# Patient Record
Sex: Male | Born: 1955
Health system: Southern US, Community
[De-identification: ages and names within clinical notes are randomized; demographics above are authoritative.]

## PROBLEM LIST (undated history)

## (undated) DIAGNOSIS — E785 Hyperlipidemia, unspecified: Secondary | ICD-10-CM

## (undated) DIAGNOSIS — I10 Essential (primary) hypertension: Secondary | ICD-10-CM

## (undated) DIAGNOSIS — R739 Hyperglycemia, unspecified: Secondary | ICD-10-CM

## (undated) DIAGNOSIS — M1990 Unspecified osteoarthritis, unspecified site: Secondary | ICD-10-CM

## (undated) DIAGNOSIS — G510 Bell's palsy: Secondary | ICD-10-CM

## (undated) DIAGNOSIS — E119 Type 2 diabetes mellitus without complications: Secondary | ICD-10-CM

## (undated) HISTORY — DX: Essential (primary) hypertension: I10

## (undated) HISTORY — DX: Type 2 diabetes mellitus without complications: E11.9

## (undated) HISTORY — DX: Bell's palsy: G51.0

## (undated) HISTORY — DX: Hyperglycemia, unspecified: R73.9

## (undated) HISTORY — PX: COLONOSCOPY W/ POLYPECTOMY: SHX1380

## (undated) HISTORY — PX: OTHER SURGICAL HISTORY: SHX169

## (undated) HISTORY — DX: Hyperlipidemia, unspecified: E78.5

## (undated) HISTORY — PX: COLONOSCOPY: SHX174

## (undated) HISTORY — DX: Unspecified osteoarthritis, unspecified site: M19.90

---

## 2001-03-02 ENCOUNTER — Encounter: Payer: Self-pay | Admitting: Internal Medicine

## 2001-03-05 ENCOUNTER — Other Ambulatory Visit: Admission: RE | Admit: 2001-03-05 | Discharge: 2001-03-05 | Payer: Self-pay | Admitting: Gastroenterology

## 2001-03-05 ENCOUNTER — Encounter (INDEPENDENT_AMBULATORY_CARE_PROVIDER_SITE_OTHER): Payer: Self-pay | Admitting: Specialist

## 2003-12-25 ENCOUNTER — Encounter: Admission: RE | Admit: 2003-12-25 | Discharge: 2003-12-25 | Payer: Self-pay | Admitting: Internal Medicine

## 2004-03-08 ENCOUNTER — Encounter: Admission: RE | Admit: 2004-03-08 | Discharge: 2004-03-08 | Payer: Self-pay | Admitting: Internal Medicine

## 2005-01-18 ENCOUNTER — Ambulatory Visit: Payer: Self-pay | Admitting: Internal Medicine

## 2005-01-25 ENCOUNTER — Ambulatory Visit: Payer: Self-pay | Admitting: Internal Medicine

## 2005-02-03 ENCOUNTER — Ambulatory Visit: Payer: Self-pay

## 2005-03-04 ENCOUNTER — Ambulatory Visit: Payer: Self-pay | Admitting: Internal Medicine

## 2005-08-16 ENCOUNTER — Ambulatory Visit: Payer: Self-pay | Admitting: Internal Medicine

## 2005-09-26 ENCOUNTER — Ambulatory Visit: Payer: Self-pay | Admitting: Internal Medicine

## 2006-03-20 ENCOUNTER — Ambulatory Visit: Payer: Self-pay | Admitting: Internal Medicine

## 2007-07-17 ENCOUNTER — Ambulatory Visit: Payer: Self-pay | Admitting: Internal Medicine

## 2007-12-11 ENCOUNTER — Telehealth: Payer: Self-pay | Admitting: *Deleted

## 2007-12-11 ENCOUNTER — Emergency Department (HOSPITAL_COMMUNITY): Admission: EM | Admit: 2007-12-11 | Discharge: 2007-12-11 | Payer: Self-pay | Admitting: Emergency Medicine

## 2007-12-24 ENCOUNTER — Ambulatory Visit: Payer: Self-pay | Admitting: Internal Medicine

## 2007-12-24 DIAGNOSIS — I10 Essential (primary) hypertension: Secondary | ICD-10-CM | POA: Insufficient documentation

## 2007-12-24 DIAGNOSIS — R7309 Other abnormal glucose: Secondary | ICD-10-CM | POA: Insufficient documentation

## 2008-05-07 ENCOUNTER — Ambulatory Visit: Payer: Self-pay | Admitting: Internal Medicine

## 2008-05-12 LAB — CONVERTED CEMR LAB
BUN: 18 mg/dL (ref 6–23)
CO2: 29 meq/L (ref 19–32)
Calcium: 9.8 mg/dL (ref 8.4–10.5)
Chloride: 101 meq/L (ref 96–112)
Creatinine, Ser: 1.3 mg/dL (ref 0.4–1.5)
GFR calc Af Amer: 74 mL/min
GFR calc non Af Amer: 61 mL/min
Glucose, Bld: 106 mg/dL — ABNORMAL HIGH (ref 70–99)
Hgb A1c MFr Bld: 6.6 % — ABNORMAL HIGH (ref 4.6–6.0)
Potassium: 4.5 meq/L (ref 3.5–5.1)
Sodium: 138 meq/L (ref 135–145)

## 2008-08-11 ENCOUNTER — Ambulatory Visit: Payer: Self-pay | Admitting: Internal Medicine

## 2008-08-12 LAB — CONVERTED CEMR LAB
ALT: 33 units/L (ref 0–53)
AST: 26 units/L (ref 0–37)
BUN: 18 mg/dL (ref 6–23)
CO2: 31 meq/L (ref 19–32)
Calcium: 9.3 mg/dL (ref 8.4–10.5)
Chloride: 111 meq/L (ref 96–112)
Cholesterol: 146 mg/dL (ref 0–200)
Creatinine, Ser: 1.2 mg/dL (ref 0.4–1.5)
GFR calc Af Amer: 81 mL/min
GFR calc non Af Amer: 67 mL/min
Glucose, Bld: 102 mg/dL — ABNORMAL HIGH (ref 70–99)
HDL: 49 mg/dL (ref >39.0)
Hgb A1c MFr Bld: 6.7 % — ABNORMAL HIGH (ref 4.6–6.0)
LDL Cholesterol: 86 mg/dL (ref 0–99)
Potassium: 4.8 meq/L (ref 3.5–5.1)
Sodium: 143 meq/L (ref 135–145)
Total CHOL/HDL Ratio: 3
Triglycerides: 57 mg/dL (ref 0–149)
VLDL: 11 mg/dL (ref 0–40)

## 2009-06-25 ENCOUNTER — Ambulatory Visit: Payer: Self-pay | Admitting: Internal Medicine

## 2009-08-06 ENCOUNTER — Ambulatory Visit: Payer: Self-pay | Admitting: Internal Medicine

## 2009-08-10 LAB — CONVERTED CEMR LAB
BUN: 13 mg/dL (ref 6–23)
CO2: 30 meq/L (ref 19–32)
Calcium: 9.1 mg/dL (ref 8.4–10.5)
Chloride: 107 meq/L (ref 96–112)
Cholesterol: 162 mg/dL (ref 0–200)
Creatinine, Ser: 1.2 mg/dL (ref 0.4–1.5)
GFR calc non Af Amer: 80.75 mL/min (ref >60)
Glucose, Bld: 111 mg/dL — ABNORMAL HIGH (ref 70–99)
HDL: 53.2 mg/dL (ref >39.00)
Hgb A1c MFr Bld: 6.7 % — ABNORMAL HIGH (ref 4.6–6.5)
LDL Cholesterol: 97 mg/dL (ref 0–99)
Potassium: 5.2 meq/L — ABNORMAL HIGH (ref 3.5–5.1)
Sodium: 140 meq/L (ref 135–145)
TSH: 2 microintl units/mL (ref 0.35–5.50)
Total CHOL/HDL Ratio: 3
Triglycerides: 61 mg/dL (ref 0.0–149.0)
VLDL: 12.2 mg/dL (ref 0.0–40.0)

## 2010-07-06 ENCOUNTER — Telehealth: Payer: Self-pay | Admitting: Internal Medicine

## 2010-12-27 ENCOUNTER — Ambulatory Visit
Admission: RE | Admit: 2010-12-27 | Discharge: 2010-12-27 | Payer: Self-pay | Source: Home / Self Care | Attending: Internal Medicine | Admitting: Internal Medicine

## 2010-12-27 ENCOUNTER — Other Ambulatory Visit: Payer: Self-pay | Admitting: Internal Medicine

## 2010-12-27 LAB — TSH: TSH: 1.75 u[IU]/mL (ref 0.35–5.50)

## 2010-12-27 LAB — CBC WITH DIFFERENTIAL/PLATELET
Basophils Absolute: 0 10*3/uL (ref 0.0–0.1)
Basophils Relative: 0.2 % (ref 0.0–3.0)
Eosinophils Absolute: 0.2 10*3/uL (ref 0.0–0.7)
Eosinophils Relative: 4 % (ref 0.0–5.0)
HCT: 45.8 % (ref 39.0–52.0)
Hemoglobin: 15.4 g/dL (ref 13.0–17.0)
Lymphocytes Relative: 49.3 % — ABNORMAL HIGH (ref 12.0–46.0)
Lymphs Abs: 2.8 10*3/uL (ref 0.7–4.0)
MCHC: 33.5 g/dL (ref 30.0–36.0)
MCV: 98.7 fl (ref 78.0–100.0)
Monocytes Absolute: 0.3 10*3/uL (ref 0.1–1.0)
Monocytes Relative: 6.2 % (ref 3.0–12.0)
Neutro Abs: 2.3 10*3/uL (ref 1.4–7.7)
Neutrophils Relative %: 40.3 % — ABNORMAL LOW (ref 43.0–77.0)
Platelets: 166 10*3/uL (ref 150.0–400.0)
RBC: 4.64 Mil/uL (ref 4.22–5.81)
RDW: 14.3 % (ref 11.5–14.6)
WBC: 5.6 10*3/uL (ref 4.5–10.5)

## 2010-12-27 LAB — LIPID PANEL
Cholesterol: 168 mg/dL (ref 0–200)
HDL: 60.8 mg/dL (ref >39.00)
LDL Cholesterol: 92 mg/dL (ref 0–99)
Total CHOL/HDL Ratio: 3
Triglycerides: 77 mg/dL (ref 0.0–149.0)
VLDL: 15.4 mg/dL (ref 0.0–40.0)

## 2010-12-27 LAB — CONVERTED CEMR LAB
Bilirubin Urine: NEGATIVE
Blood in Urine, dipstick: NEGATIVE
Glucose, Urine, Semiquant: NEGATIVE
Ketones, urine, test strip: NEGATIVE
Nitrite: NEGATIVE
Protein, U semiquant: NEGATIVE
Specific Gravity, Urine: >=1.03
Urobilinogen, UA: 0.2
WBC Urine, dipstick: NEGATIVE
pH: 5.5

## 2010-12-27 LAB — PSA: PSA: 0.64 ng/mL (ref 0.10–4.00)

## 2010-12-27 LAB — BASIC METABOLIC PANEL
BUN: 18 mg/dL (ref 6–23)
CO2: 27 mEq/L (ref 19–32)
Calcium: 8.9 mg/dL (ref 8.4–10.5)
Chloride: 103 mEq/L (ref 96–112)
Creatinine, Ser: 1.3 mg/dL (ref 0.4–1.5)
GFR: 70.74 mL/min (ref >60.00)
Glucose, Bld: 107 mg/dL — ABNORMAL HIGH (ref 70–99)
Potassium: 4.4 mEq/L (ref 3.5–5.1)
Sodium: 137 mEq/L (ref 135–145)

## 2010-12-27 LAB — HEPATIC FUNCTION PANEL
ALT: 30 U/L (ref 0–53)
AST: 25 U/L (ref 0–37)
Albumin: 3.9 g/dL (ref 3.5–5.2)
Alkaline Phosphatase: 72 U/L (ref 39–117)
Bilirubin, Direct: 0.1 mg/dL (ref 0.0–0.3)
Total Bilirubin: 0.4 mg/dL (ref 0.3–1.2)
Total Protein: 7.3 g/dL (ref 6.0–8.3)

## 2011-01-03 ENCOUNTER — Ambulatory Visit
Admission: RE | Admit: 2011-01-03 | Discharge: 2011-01-03 | Payer: Self-pay | Source: Home / Self Care | Attending: Internal Medicine | Admitting: Internal Medicine

## 2011-01-11 NOTE — Assessment & Plan Note (Signed)
Summary: fu from er bell palsy/njr   Vital Signs:  Patient Profile:   55 Years Old Male Weight:      252 pounds Temp:     98.3 degrees F Pulse rate:   96 / minute BP sitting:   112 / 76  Vitals Entered By: Gladis Riffle, RN (December 24, 2007 11:48 AM)                 Preventive Care Screening  Last Flu Shot:    Date:  12/24/2007    Results:  Fluvax Non-MCR  Last Tetanus Booster:    Date:  01/31/2001    Results:  given    Chief Complaint:  FU from ER and was diagnosed with bells palsy and put on prednisone and acyclovir with good results.  History of Present Illness: patient was recently diagnosed with Bell's palsy the emergency department.  See recent phone note for date.  Patient was empirically treated with acyclovir and prednisone.  Patient did not notice any significant recovery after starting medications but he has slowly improved.  He denies any current symptoms.  He denies any pain.  He denies any other neurologic deficits.  Past Medical History: Hypertension hyperglycemia   Social History: Married 2 healthy children    Current Allergies (reviewed today): No known allergies   Past Medical History:    Hypertension    hyperglycemia  Past Surgical History:    Denies surgical history    Bells palsy   Family History:    Family History of Colon CA 1st degree relative <60    Family History Diabetes 1st degree relative    Family History of Stroke M 1st degree relative <50    father died of accident    41 siblings, several with htn and DM; eldest brother with colon cancer  Social History:    Married    2 healthy children   Risk Factors:  Tobacco use:  never   Review of Systems       no other complaints in a complete ROS    Physical Exam  General:     Well-developed,well-nourished,in no acute distress; alert,appropriate and cooperative throughout examination Head:     normocephalic and atraumatic.   Nose:     no external deformity.     Mouth:     good dentition and no gingival abnormalities.   Neck:     No deformities, masses, or tenderness noted. Lungs:     Normal respiratory effort, chest expands symmetrically. Lungs are clear to auscultation, no crackles or wheezes. Msk:     No deformity or scoliosis noted of thoracic or lumbar spine.   Neurologic:     alert & oriented X3 and strength normal in all extremities.   there is significant weakness the right-sided facial muscles.  He does have good eyelid strength.    Impression & Recommendations:  Problem # 1:  BELL'S PALSY (ICD-351.0) should spontaneously resolve.  He should have complete recovery.  Patient was reassured.  A call me if his symptoms fail to improve.  Complete Medication List: 1)  Lisinopril-hydrochlorothiazide 20-25 Mg Tabs (Lisinopril-hydrochlorothiazide) .... One daily  Other Orders: Influenza Vaccine NON MCR (25956)     ]  Influenza Vaccine    Vaccine Type: Fluvax Non-MCR    Given by: Gladis Riffle, RN  Flu Vaccine Consent Questions    Do you have a history of severe allergic reactions to this vaccine? no    Any prior history of allergic reactions  to egg and/or gelatin? no    Do you have a sensitivity to the preservative Thimersol? no    Do you have a past history of Guillan-Barre Syndrome? no    Do you currently have an acute febrile illness? no    Have you ever had a severe reaction to latex? no    Vaccine information given and explained to patient? yes lot [id number], exp June 30,2009, [employer], left deltoid IM, 0.5 cc ..................................................................Marland KitchenGladis Riffle, RN  December 24, 2007 11:53 AM

## 2011-01-11 NOTE — Assessment & Plan Note (Signed)
Summary: pt no feeling well/njr   Vital Signs:  Patient Profile:   55 Years Old Male Weight:      257 pounds Temp:     98.4 degrees F oral BP sitting:   150 / 100  (left arm)  Vitals Entered By: Raechel Ache, RN (July 17, 2007 9:44 AM)               Chief Complaint:  Last Thursday got too hot; felt sluggish and had pain between shoulder blades; lasted 3 days. Better now..  History of Present Illness: 55 year old male, history of hypertension, off medications for approximately 1 year.  Last week.  He had some upper interscapular back pain that lasted 3 to 4 days.  This is aggravated by movement such as bending and stretching.  Pain is now resolved  Current Allergies: No known allergies       Physical Exam  General:     Well-developed,well-nourished,in no acute distress; alert,appropriate and cooperative throughout examination   blood pressure 150/100 Eyes:     No corneal or conjunctival inflammation noted. EOMI. Perrla. Funduscopic exam benign, without hemorrhages, exudates or papilledema. Vision grossly normal. Neck:     No deformities, masses, or tenderness noted. Chest Wall:     No deformities, masses, tenderness or gynecomastia noted. no upper back, her interscapular tenderness Lungs:     Normal respiratory effort, chest expands symmetrically. Lungs are clear to auscultation, no crackles or wheezes. Heart:     Normal rate and regular rhythm. S1 and S2 normal without gallop, murmur, click, rub or other extra sounds.    Impression & Recommendations:  Problem # 1:  HYPERTENSION, BENIGN ESSENTIAL (ICD-401.1)  His updated medication list for this problem includes:    Lisinopril-hydrochlorothiazide 20-25 Mg Tabs (Lisinopril-hydrochlorothiazide) ..... One daily   Problem # 2:  BACK PAIN, UPPER (ICD-724.5)  Complete Medication List: 1)  Lisinopril-hydrochlorothiazide 20-25 Mg Tabs (Lisinopril-hydrochlorothiazide) .... One daily   Patient Instructions: 1)   Please schedule a follow-up appointment in 3 months. 2)  Limit your Sodium (Salt) to less than 4 grams a day (slightly less than 1 teaspoon) to prevent fluid retention, swelling, or worsening or symptoms. 3)  It is important that you exercise regularly at least 20 minutes 5 times a week. If you develop chest pain, have severe difficulty breathing, or feel very tired , stop exercising immediately and seek medical attention.    Prescriptions: LISINOPRIL-HYDROCHLOROTHIAZIDE 20-25 MG  TABS (LISINOPRIL-HYDROCHLOROTHIAZIDE) one daily  #90 x 3   Entered and Authorized by:   Gordy Savers  MD   Signed by:   Gordy Savers  MD on 07/17/2007   Method used:   Print then Give to Patient   RxID:   [id number]

## 2011-01-11 NOTE — Assessment & Plan Note (Signed)
Summary: blood pressure/mhf   Vital Signs:  Patient profile:   55 year old male Weight:      256.5 pounds Temp:     98 degrees F Pulse rate:   70 / minute Resp:     12 per minute BP sitting:   128 / 90  (left arm)  Vitals Entered By: Gladis Riffle, RN (June 25, 2009 11:20 AM)  History of Present Illness: pt describes presyncopal spell. pt states he was very hot, helping nephew move up 3 flights of stairs. Sxs occured the same evening. Felt light headed, dak sensation. Pt. denies LOC. no fever or chills \\par  no headache.  All other systems reviewed and were negative   Allergies (verified): No Known Drug Allergies  Comments:  Nurse/Medical Assistant: recheck BP, states has been good since starting medication--BP 120/80 when last checked at home The patient's medications and allergies were reviewed with the patient and were updated in the Medication and Allergy Lists. Gladis Riffle, RN (June 25, 2009 11:22 AM)  Past History:  Past Medical History: Last updated: 12/24/2007 Hypertension hyperglycemia  Past Surgical History: Last updated: 12/24/2007 Denies surgical history Bells palsy  Family History: Last updated: 08/11/2008 Family History of Colon CA 1st degree relative <60 Family History Diabetes 1st degree relative Family History of Stroke M 1st degree relative <50 father died of accident 10 siblings, several with htn and DM; eldest brother with colon cancer  Social History: Last updated: 12/24/2007 Married 2 healthy children  Risk Factors: Smoking Status: never (12/24/2007)  Physical Exam  General:  Well-developed,well-nourished,in no acute distress; alert,appropriate and cooperative throughout examination Head:  normocephalic and atraumatic.   Eyes:  pupils equal and pupils round.   Ears:  R ear normal and L ear normal.   Neck:  No deformities, masses, or tenderness noted. Chest Wall:  No deformities, masses, tenderness or gynecomastia noted. Lungs:  normal  respiratory effort and normal breath sounds.   Heart:  regular rhythm, no murmur, and no gallop.   Abdomen:  Bowel sounds positive,abdomen soft and non-tender without masses, organomegaly or hernias noted. overweight Msk:  No deformity or scoliosis noted of thoracic or lumbar spine.   Pulses:  R radial normal and L radial normal.   Neurologic:  strength normal in all extremities and gait normal.   Cervical Nodes:  no anterior cervical adenopathy and no posterior cervical adenopathy.   Psych:  memory intact for recent and remote and not anxious appearing.     Impression & Recommendations:  Problem # 1:  HYPERTENSION (ICD-401.9) ? overtreated based on symptoms will adjust meds and f/u in one month call for any recurrence  The following medications were removed from the medication list:    Lisinopril-hydrochlorothiazide 20-25 Mg Tabs (Lisinopril-hydrochlorothiazide) ..... One daily His updated medication list for this problem includes:    Lisinopril 10 Mg Tabs (Lisinopril) .Marland Kitchen... Take 1 tab by mouth daily  Problem # 2:  SYNCOPE (ICD-780.2) presyncope--likely secondary to above  Complete Medication List: 1)  Lisinopril 10 Mg Tabs (Lisinopril) .... Take 1 tab by mouth daily  Patient Instructions: 1)  see me 4-6 weeks Prescriptions: LISINOPRIL 10 MG  TABS (LISINOPRIL) Take 1 tab by mouth daily  #90 x 3   Entered and Authorized by:   Birdie Sons MD   Signed by:   Birdie Sons MD on 06/25/2009   Method used:   Electronically to        [facility]  [address] [phone]* (retail)  [address]       [place], [place]  [address]       Ph: [phone] or [phone]       Fax: [phone]   RxID:   [id number]

## 2011-01-11 NOTE — Progress Notes (Signed)
Summary: Rt eye unable to blink/face swollen  Phone Note Call from Patient Call back at Home Phone [phone]   Caller: Spouse Call For: Swords Summary of Call: Pt wife called to report her husband awoke this morning with the right side of his face swollen, his Rt eye will not blink or tear.  His vision is reported to be OK, denies tooth or ear pain, right side of mouth drooping, however husband is able to talk fine, able to raise both arms over head.  Wife was instructed to bring husband to ER for evaluate of sx.  Wife voiced her understanding. Initial call taken by: Sid Falcon LPN,  December 11, 2007 10:58 AM  Follow-up for Phone Call        Called pt back, he was diagnosed with Bells Palsy in ER.  A CT scan was done, which pt reports was NEG.  Pt was put on an antibiotic and steroids.  He does not have a patch on his eye, and his sx have not changed yet since this am.  He was instructed to schedule an OV with Dr Cato Mulligan next week, and this has been done. Follow-up by: Sid Falcon LPN,  December 11, 2007 3:59 PM

## 2011-01-11 NOTE — Progress Notes (Signed)
Summary: med refill  Phone Note Call from Patient Call back at Home Phone [phone]   Caller: Patient Call For: Jason [name] Summary of Call: pt needs refill on lisinopril 10 mg [facility] [address] [phone] Initial call taken by: Heron Sabins,  July 06, 2010 3:13 PM    Prescriptions: LISINOPRIL 10 MG  TABS (LISINOPRIL) Take 1 tab by mouth daily  #90 x 3   Entered by:   Kern Reap CMA (AAMA)   Authorized by:   Jason [name]   Signed by:   Kern Reap CMA (AAMA) on 07/06/2010   Method used:   [facility]  [address] [id number]       [address]       [place], [place]  [address]       Ph: [phone] or [phone]       Fax: [phone]   RxID:   [id number]

## 2011-01-11 NOTE — Assessment & Plan Note (Signed)
Summary: fup per dr swords/will fast/ccm   Vital Signs:  Patient Profile:   55 Years Old Male Weight:      258 pounds Temp:     98.4 degrees F Pulse rate:   60 / minute BP sitting:   124 / 86  (left arm)  Vitals Entered By: Gladis Riffle, RN (August 11, 2008 8:57 AM)                 Chief Complaint:  3 month rov--was to have lost weight for elevated blood sugar last visit.  History of Present Illness:  Hypertension Follow-Up      This is a 55 year old man who presents for Hypertension follow-up.  The patient denies lightheadedness, urinary frequency, headaches, edema, impotence, rash, and fatigue.  The patient denies the following associated symptoms: chest pain, chest pressure, exercise intolerance, dyspnea, palpitations, syncope, leg edema, and pedal edema.  Compliance with medications (by patient report) has been near 100%.  The patient reports that dietary compliance has been poor.  The patient reports exercising occasionally.  Adjunctive measures currently used by the patient include salt restriction.    Past Medical History: Hypertension hyperglycemia Past Surgical History: Denies surgical history Bells palsy Social History: Married 2 healthy children Family History: Family History of Colon CA 1st degree relative <60 Family History Diabetes 1st degree relative Family History of Stroke M 1st degree relative <50 father died of accident 46 siblings, several with htn and DM; eldest brother with colon cancer  no other complaints in a complete ROS     Prior Medication List:  LISINOPRIL-HYDROCHLOROTHIAZIDE 20-25 MG  TABS (LISINOPRIL-HYDROCHLOROTHIAZIDE) one daily   Current Allergies (reviewed today): No known allergies    Family History:    Family History of Colon CA 1st degree relative <60    Family History Diabetes 1st degree relative    Family History of Stroke M 1st degree relative <50    father died of accident    17 siblings, several with htn and DM; eldest  brother with colon cancer         Physical Exam  General:     alert and well-developed.  overweight Head:     normocephalic and atraumatic.   Eyes:     pupils equal and pupils round.   Ears:     R ear normal and L ear normal.   Neck:     No deformities, masses, or tenderness noted. Chest Wall:     No deformities, masses, tenderness or gynecomastia noted. Lungs:     normal respiratory effort and normal breath sounds.   Heart:     regular rhythm, no murmur, and no gallop.   Abdomen:     Bowel sounds positive,abdomen soft and non-tender without masses, organomegaly or hernias noted. overweight Msk:     No deformity or scoliosis noted of thoracic or lumbar spine.   Pulses:     R radial normal and L radial normal.   Extremities:     No clubbing, cyanosis, edema, or deformity noted  Neurologic:     strength normal in all extremities and gait normal.      Impression & Recommendations:  Problem # 1:  HYPERGLYCEMIA (ICD-790.29) has not lost weight as instructed not following a diet or exercise plan---I suspect he will need metformin---will check labbs no obvious symptoms of CM Labs Reviewed: HgBA1c: 6.6 (05/07/2008)   Creat: 1.3 (05/07/2008)     later: reviewed A1C---less than 7---needs to lose weight  Problem #  2:  HYPERTENSION (ICD-401.9) fair control, continue meds His updated medication list for this problem includes:    Lisinopril-hydrochlorothiazide 20-25 Mg Tabs (Lisinopril-hydrochlorothiazide) ..... One daily  BP today: 124/86 Prior BP: 132/94 (05/07/2008)  Labs Reviewed: Creat: 1.3 (05/07/2008)   Complete Medication List: 1)  Lisinopril-hydrochlorothiazide 20-25 Mg Tabs (Lisinopril-hydrochlorothiazide) .... One daily  Other Orders: TLB-A1C / Hgb A1C (Glycohemoglobin) (83036-A1C) TLB-BMP (Basic Metabolic Panel-BMET) (80048-METABOL) TLB-Lipid Panel (80061-LIPID) TLB-ALT (SGPT) (84460-ALT) TLB-AST (SGOT) (84450-SGOT)    ]

## 2011-01-11 NOTE — Assessment & Plan Note (Signed)
Summary: 6 wk rov/njr   Vital Signs:  Patient profile:   55 year old male Weight:      259 pounds Temp:     97.8 degrees F Pulse rate:   54 / minute Resp:     12 per minute BP sitting:   122 / 88  (left arm)  Vitals Entered By: Gladis Riffle, RN (August 06, 2009 8:27 AM)  Current Problems (verified): 1)  Family History Diabetes 1st Degree Relative  (ICD-V18.0) 2)  Family History of Colon Ca 1st Degree Relative <60  (ICD-V16.0) 3)  Hyperglycemia  (ICD-790.29) 4)  Hypertension  (ICD-401.9)  Current Medications (verified): 1)  Lisinopril 10 Mg  Tabs (Lisinopril) .... Take 1 Tab By Mouth Daily  Allergies (verified): No Known Drug Allergies  Comments:  Nurse/Medical Assistant: 6 week rov  The patient's medications and allergies were reviewed with the patient and were updated in the Medication and Allergy Lists. Gladis Riffle, RN (August 06, 2009 8:27 AM)   Impression & Recommendations:  Problem # 1:  HYPERTENSION (ICD-401.9)  well controlled continue current medications  His updated medication list for this problem includes:    Lisinopril 10 Mg Tabs (Lisinopril) .Marland Kitchen... Take 1 tab by mouth daily  BP today: 122/88 Prior BP: 128/90 (06/25/2009)  Labs Reviewed: K+: 4.8 (08/11/2008) Creat: : 1.2 (08/11/2008)   Chol: 146 (08/11/2008)   HDL: 49.0 (08/11/2008)   LDL: 86 (08/11/2008)   TG: 57 (08/11/2008)  Problem # 2:  HYPERGLYCEMIA (ICD-790.29) well controlled previously, needs recheck Labs Reviewed: Creat: 1.2 (08/11/2008)     Orders: Venipuncture (57846) TLB-Lipid Panel (80061-LIPID) TLB-BMP (Basic Metabolic Panel-BMET) (80048-METABOL) TLB-TSH (Thyroid Stimulating Hormone) (84443-TSH) TLB-A1C / Hgb A1C (Glycohemoglobin) (83036-A1C)  Complete Medication List: 1)  Lisinopril 10 Mg Tabs (Lisinopril) .... Take 1 tab by mouth daily  Patient Instructions: 1)  Please schedule a follow-up appointment in 6 months.  Appended Document: 6 wk rov/njr     History of Present  Illness:  Follow-Up Visit      This is a 55 year old man who presents for Follow-up visit.  The patient denies chest pain and palpitations.  Since the last visit the patient notes no new problems or concerns.  The patient reports taking meds as prescribed and not monitoring BP.  When questioned about possible medication side effects, the patient notes none.    All other systems reviewed and were negative   Allergies: No Known Drug Allergies  Past History:  Past Medical History: Last updated: 12/24/2007 Hypertension hyperglycemia  Past Surgical History: Last updated: 12/24/2007 Denies surgical history Bells palsy  Family History: Last updated: 08/11/2008 Family History of Colon CA 1st degree relative <60 Family History Diabetes 1st degree relative Family History of Stroke M 1st degree relative <50 father died of accident 89 siblings, several with htn and DM; eldest brother with colon cancer  Social History: Last updated: 12/24/2007 Married 2 healthy children  Risk Factors: Smoking Status: never (12/24/2007)  Physical Exam  General:  Well-developed,well-nourished,in no acute distress; alert,appropriate and cooperative throughout examination Head:  normocephalic and atraumatic.   Eyes:  pupils equal and pupils round.   Lungs:  normal respiratory effort and normal breath sounds.   Heart:  regular rhythm, no murmur, and no gallop.   Abdomen:  Bowel sounds positive,abdomen soft and non-tender without masses, organomegaly or hernias noted. overweight   Complete Medication List: 1)  Lisinopril 10 Mg Tabs (Lisinopril) .... Take 1 tab by mouth daily

## 2011-01-11 NOTE — Progress Notes (Signed)
Summary: Lisinopril Refill  Phone Note Refill Request Message from:  [employer]  Refills Requested: Medication #1:  LISINOPRIL 10 MG  TABS Take 1 tab by mouth daily.   Dosage confirmed as above?Dosage Confirmed Initial call taken by: Kathrynn Speed CMA,  July 06, 2010 8:35 AM

## 2011-01-11 NOTE — Assessment & Plan Note (Signed)
Summary: ?dm/pt blacked out over 2 wk ago/wife will cb if pt can come ...   Vital Signs:  Patient Profile:   55 Years Old Male Weight:      254 pounds Temp:     98.2 degrees F Pulse rate:   56 / minute BP sitting:   132 / 94  (left arm)  Vitals Entered By: Gladis Riffle, RN (May 07, 2008 11:19 AM)                 Chief Complaint:  almost blacked out 2 weeks ago and states has occured in past but was able to "stop it"  this time.  History of Present Illness:     Hypertension Follow-Up      This is a 55 year old man who presents for Hypertension follow-up.  The patient denies lightheadedness, urinary frequency, headaches, edema, impotence, rash, and fatigue.  The patient denies the following associated symptoms: chest pain, chest pressure, exercise intolerance, dyspnea, palpitations, syncope, leg edema, and pedal edema.  Compliance with medications (by patient report) has been near 100%.  The patient reports that dietary compliance has been fair.  The patient reports exercising occasionally.    2 weeks ago he felt light headed while in the process of moving from a lying ot sitting position. He describes weakness---lasted for 10-30 minutes. Sxs associated with "heavy sweating".   Past Medical History: Hypertension hyperglycemia Past Surgical History: Denies surgical history Bells palsy Social History: Married 2 healthy children Family History: Family History of Colon CA 1st degree relative <60 Family History Diabetes 1st degree relative Family History of Stroke M 1st degree relative <50 father died of accident 61 siblings, several with htn and DM; eldest brother with colon cancer      Current Allergies (reviewed today): No known allergies   Past Surgical History:    Reviewed history from 12/24/2007 and no changes required:       Denies surgical history       Bells palsy   Family History:    Reviewed history from 12/24/2007 and no changes required:       Family  History of Colon CA 1st degree relative <60       Family History Diabetes 1st degree relative       Family History of Stroke M 1st degree relative <50       father died of accident       56 siblings, several with htn and DM; eldest brother with colon cancer  Social History:    Reviewed history from 12/24/2007 and no changes required:       Married       2 healthy children    Review of Systems       no other complaints in a complete ROS    Physical Exam  General:     Well-developed,well-nourished,in no acute distress; alert,appropriate and cooperative throughout examination Head:     normocephalic and atraumatic.   Eyes:     pupils equal and pupils round.   Ears:     R ear normal and L ear normal.   Nose:     no external deformity and no external erythema.   Neck:     No deformities, masses, or tenderness noted. Chest Wall:     No deformities, masses, tenderness or gynecomastia noted. Lungs:     Normal respiratory effort, chest expands symmetrically. Lungs are clear to auscultation, no crackles or wheezes. Heart:     Normal rate  and regular rhythm. S1 and S2 normal without gallop, murmur, click, rub or other extra sounds. Abdomen:     Bowel sounds positive,abdomen soft and non-tender without masses, organomegaly or hernias noted. Msk:     No deformity or scoliosis noted of thoracic or lumbar spine.   Pulses:     R radial normal and L radial normal.   Extremities:     No clubbing, cyanosis, edema, or deformity noted  Neurologic:     cranial nerves II-XII intact and gait normal.      Impression & Recommendations:  Problem # 1:  HYPERTENSION (ICD-401.9) monitor at home---goal BP < 130/85 His updated medication list for this problem includes:    Lisinopril-hydrochlorothiazide 20-25 Mg Tabs (Lisinopril-hydrochlorothiazide) ..... One daily  Orders: Venipuncture (96295) TLB-BMP (Basic Metabolic Panel-BMET) (80048-METABOL)  BP today: 132/94 Prior BP: 112/76  (12/24/2007)   Problem # 2:  HYPERGLYCEMIA (ICD-790.29) check labs today Orders: TLB-A1C / Hgb A1C (Glycohemoglobin) (83036-A1C)   Complete Medication List: 1)  Lisinopril-hydrochlorothiazide 20-25 Mg Tabs (Lisinopril-hydrochlorothiazide) .... One daily    ]

## 2011-01-13 NOTE — Assessment & Plan Note (Signed)
Summary: cpx//ccm   Vital Signs:  Patient profile:   55 year old male Height:      74 inches Weight:      270 pounds BMI:     34.79 Temp:     99.0 degrees F oral Pulse rate:   72 / minute Pulse rhythm:   regular BP sitting:   126 / 82  (left arm) Cuff size:   large  Vitals Entered By: Alfred Levins, CMA (January 03, 2011 10:17 AM) CC: cpx   CC:  cpx.  History of Present Illness: CPX  Current Problems (verified): 1)  Preventive Health Care  (ICD-V70.0) 2)  Hyperglycemia  (ICD-790.29) 3)  Hypertension  (ICD-401.9)  Current Medications (verified): 1)  Lisinopril 10 Mg  Tabs (Lisinopril) .... Take 1 Tab By Mouth Daily  Allergies (verified): No Known Drug Allergies  Past History:  Past Medical History: Last updated: 12/24/2007 Hypertension hyperglycemia  Past Surgical History: Last updated: 12/24/2007 Denies surgical history Bells palsy  Family History: Last updated: 01/03/2011 Family History of Colon CA 1st degree relative <60 Family History Diabetes 1st degree relative Family History of Stroke M 1st degree relative stroke--deceased in her 84s.  father died of accident 48 siblings, several with htn and DM; eldest brother with colon cancer  Social History: Last updated: 12/24/2007 Married 2 healthy children  Risk Factors: Smoking Status: never (12/24/2007)  Family History: Family History of Colon CA 1st degree relative <60 Family History Diabetes 1st degree relative Family History of Stroke M 1st degree relative stroke--deceased in her 78s.  father died of accident 90 siblings, several with htn and DM; eldest brother with colon cancer  Physical Exam  General:   overweight male in no acute distress. HEENT exam atraumatic, normocephalic, extraocular muscles are intact. Neck is supple. Chest clear to auscultation without increased work of breathing. Cardiac exam S1-S2 are regular. Abdominal exam obese, and also, soft and nontender. Extremities no edema.  Neurologic exam he is alert with a normal gait.   Impression & Recommendations:  Problem # 1:  PREVENTIVE HEALTH CARE (ICD-V70.0) health maint utd i have called for colonoscopy report  Problem # 2:  HYPERGLYCEMIA (ICD-790.29) discussed he should concentrate on weight loss especially given strong fhx of dm.   Problem # 3:  HYPERTENSION (ICD-401.9) controlled continue current medications  His updated medication list for this problem includes:    Lisinopril 10 Mg Tabs (Lisinopril) .Marland Kitchen... Take 1 tab by mouth daily  BP today: 126/82 Prior BP: 122/88 (08/06/2009)  Labs Reviewed: K+: 4.4 (12/27/2010) Creat: : 1.3 (12/27/2010)   Chol: 168 (12/27/2010)   HDL: 60.80 (12/27/2010)   LDL: 92 (12/27/2010)   TG: 77.0 (12/27/2010)  Complete Medication List: 1)  Lisinopril 10 Mg Tabs (Lisinopril) .... Take 1 tab by mouth daily  Patient Instructions: 1)  Please schedule a follow-up appointment in 6 months. 2)  labs one week prior to visit 3)  lipids---272.4 4)  lfts-995.2 5)  bmet-995.2 6)  A1C-250.02 7)

## 2011-01-13 NOTE — Procedures (Signed)
Summary: Colonoscopy Report/[facility]  Colonoscopy Report/[facility]   Imported By: Maryln Gottron 01/03/2011 14:31:26  _____________________________________________________________________  External Attachment:    Type:   Image     Comment:   External Document

## 2011-05-24 ENCOUNTER — Observation Stay (HOSPITAL_COMMUNITY)
Admission: EM | Admit: 2011-05-24 | Discharge: 2011-05-25 | Disposition: A | Discharge status: Home / Self Care | Payer: 59 | Attending: Internal Medicine | Admitting: Internal Medicine

## 2011-05-24 ENCOUNTER — Emergency Department (HOSPITAL_COMMUNITY): Payer: 59

## 2011-05-24 ENCOUNTER — Inpatient Hospital Stay (INDEPENDENT_AMBULATORY_CARE_PROVIDER_SITE_OTHER)
Admission: RE | Admit: 2011-05-24 | Discharge: 2011-05-24 | Disposition: A | Discharge status: Home / Self Care | Payer: 59 | Source: Ambulatory Visit | Attending: Family Medicine | Admitting: Family Medicine

## 2011-05-24 DIAGNOSIS — R55 Syncope and collapse: Secondary | ICD-10-CM | POA: Insufficient documentation

## 2011-05-24 DIAGNOSIS — E669 Obesity, unspecified: Secondary | ICD-10-CM | POA: Insufficient documentation

## 2011-05-24 DIAGNOSIS — E119 Type 2 diabetes mellitus without complications: Secondary | ICD-10-CM | POA: Insufficient documentation

## 2011-05-24 DIAGNOSIS — I498 Other specified cardiac arrhythmias: Principal | ICD-10-CM | POA: Insufficient documentation

## 2011-05-24 DIAGNOSIS — I10 Essential (primary) hypertension: Secondary | ICD-10-CM | POA: Insufficient documentation

## 2011-05-24 DIAGNOSIS — R42 Dizziness and giddiness: Secondary | ICD-10-CM

## 2011-05-24 DIAGNOSIS — E875 Hyperkalemia: Secondary | ICD-10-CM | POA: Insufficient documentation

## 2011-05-24 DIAGNOSIS — Z79899 Other long term (current) drug therapy: Secondary | ICD-10-CM | POA: Insufficient documentation

## 2011-05-24 DIAGNOSIS — I495 Sick sinus syndrome: Secondary | ICD-10-CM

## 2011-05-24 LAB — CBC
HCT: 43.4 % (ref 39.0–52.0)
Hemoglobin: 14.3 g/dL (ref 13.0–17.0)
MCH: 31.6 pg (ref 26.0–34.0)
MCHC: 32.9 g/dL (ref 30.0–36.0)
MCV: 96 fL (ref 78.0–100.0)
Platelets: 164 10*3/uL (ref 150–400)
RBC: 4.52 MIL/uL (ref 4.22–5.81)
RDW: 13.8 % (ref 11.5–15.5)
WBC: 6.3 10*3/uL (ref 4.0–10.5)

## 2011-05-24 LAB — COMPREHENSIVE METABOLIC PANEL
ALT: 32 U/L (ref 0–53)
AST: 21 U/L (ref 0–37)
Albumin: 3.7 g/dL (ref 3.5–5.2)
Alkaline Phosphatase: 80 U/L (ref 39–117)
BUN: 13 mg/dL (ref 6–23)
CO2: 30 mEq/L (ref 19–32)
Calcium: 9.3 mg/dL (ref 8.4–10.5)
Chloride: 102 mEq/L (ref 96–112)
Creatinine, Ser: 0.94 mg/dL (ref 0.4–1.5)
GFR calc Af Amer: >60 mL/min (ref >60)
GFR calc non Af Amer: >60 mL/min (ref >60)
Glucose, Bld: 130 mg/dL — ABNORMAL HIGH (ref 70–99)
Potassium: 5.3 mEq/L — ABNORMAL HIGH (ref 3.5–5.1)
Sodium: 136 mEq/L (ref 135–145)
Total Bilirubin: 0.4 mg/dL (ref 0.3–1.2)
Total Protein: 7.3 g/dL (ref 6.0–8.3)

## 2011-05-24 LAB — POCT I-STAT, CHEM 8
BUN: 13 mg/dL (ref 6–23)
Calcium, Ion: 1.18 mmol/L (ref 1.12–1.32)
Chloride: 103 mEq/L (ref 96–112)
Creatinine, Ser: 1.3 mg/dL (ref 0.4–1.5)
Glucose, Bld: 149 mg/dL — ABNORMAL HIGH (ref 70–99)
HCT: 47 % (ref 39.0–52.0)
Hemoglobin: 16 g/dL (ref 13.0–17.0)
Potassium: 5.1 mEq/L (ref 3.5–5.1)
Sodium: 138 mEq/L (ref 135–145)
TCO2: 27 mmol/L (ref 0–100)

## 2011-05-24 LAB — POCT URINALYSIS DIP (DEVICE)
Bilirubin Urine: NEGATIVE
Glucose, UA: NEGATIVE mg/dL
Hgb urine dipstick: NEGATIVE
Ketones, ur: NEGATIVE mg/dL
Leukocytes, UA: NEGATIVE
Nitrite: NEGATIVE
Protein, ur: 30 mg/dL — AB
Specific Gravity, Urine: >=1.03 (ref 1.005–1.030)
Urobilinogen, UA: 0.2 mg/dL (ref 0.0–1.0)
pH: 5.5 (ref 5.0–8.0)

## 2011-05-24 LAB — T4, FREE: Free T4: 0.92 ng/dL (ref 0.80–1.80)

## 2011-05-24 LAB — DIFFERENTIAL
Basophils Absolute: 0 10*3/uL (ref 0.0–0.1)
Basophils Relative: 0 % (ref 0–1)
Eosinophils Absolute: 0.1 10*3/uL (ref 0.0–0.7)
Eosinophils Relative: 1 % (ref 0–5)
Lymphocytes Relative: 23 % (ref 12–46)
Lymphs Abs: 1.4 10*3/uL (ref 0.7–4.0)
Monocytes Absolute: 0.2 10*3/uL (ref 0.1–1.0)
Monocytes Relative: 4 % (ref 3–12)
Neutro Abs: 4.5 10*3/uL (ref 1.7–7.7)
Neutrophils Relative %: 72 % (ref 43–77)

## 2011-05-24 LAB — MAGNESIUM: Magnesium: 2.1 mg/dL (ref 1.5–2.5)

## 2011-05-24 LAB — HEMOGLOBIN A1C
Hgb A1c MFr Bld: 7 % — ABNORMAL HIGH (ref <5.7)
Mean Plasma Glucose: 154 mg/dL — ABNORMAL HIGH (ref <117)

## 2011-05-24 LAB — TSH
TSH: 4.001 u[IU]/mL (ref 0.350–4.500)
TSH: 3.397 u[IU]/mL (ref 0.350–4.500)

## 2011-05-24 LAB — CK TOTAL AND CKMB (NOT AT ARMC)
CK, MB: 7.6 ng/mL (ref 0.3–4.0)
Relative Index: 1.9 (ref 0.0–2.5)
Total CK: 409 U/L — ABNORMAL HIGH (ref 7–232)

## 2011-05-24 LAB — T4: T4, Total: 7.6 ug/dL (ref 5.0–12.5)

## 2011-05-24 LAB — TROPONIN I: Troponin I: <0.3 ng/mL (ref <0.30)

## 2011-05-24 LAB — CARDIAC PANEL(CRET KIN+CKTOT+MB+TROPI)
CK, MB: 6.6 ng/mL (ref 0.3–4.0)
Relative Index: 1.8 (ref 0.0–2.5)
Total CK: 366 U/L — ABNORMAL HIGH (ref 7–232)
Troponin I: <0.3 ng/mL (ref <0.30)

## 2011-05-25 DIAGNOSIS — I519 Heart disease, unspecified: Secondary | ICD-10-CM

## 2011-05-25 LAB — COMPREHENSIVE METABOLIC PANEL
ALT: 28 U/L (ref 0–53)
AST: 20 U/L (ref 0–37)
Albumin: 3.3 g/dL — ABNORMAL LOW (ref 3.5–5.2)
Alkaline Phosphatase: 75 U/L (ref 39–117)
BUN: 14 mg/dL (ref 6–23)
CO2: 30 mEq/L (ref 19–32)
Calcium: 8.9 mg/dL (ref 8.4–10.5)
Chloride: 104 mEq/L (ref 96–112)
Creatinine, Ser: 1.13 mg/dL (ref 0.4–1.5)
GFR calc Af Amer: >60 mL/min (ref >60)
GFR calc non Af Amer: >60 mL/min (ref >60)
Glucose, Bld: 94 mg/dL (ref 70–99)
Potassium: 4.1 mEq/L (ref 3.5–5.1)
Sodium: 142 mEq/L (ref 135–145)
Total Bilirubin: 0.4 mg/dL (ref 0.3–1.2)
Total Protein: 6.7 g/dL (ref 6.0–8.3)

## 2011-05-25 LAB — DRUGS OF ABUSE SCREEN W/O ALC, ROUTINE URINE
Amphetamine Screen, Ur: NEGATIVE
Barbiturate Quant, Ur: NEGATIVE
Benzodiazepines.: NEGATIVE
Cocaine Metabolites: NEGATIVE
Creatinine,U: 250.6 mg/dL
Marijuana Metabolite: POSITIVE — AB
Methadone: NEGATIVE
Opiate Screen, Urine: POSITIVE — AB
Phencyclidine (PCP): NEGATIVE
Propoxyphene: NEGATIVE

## 2011-05-25 LAB — LIPID PANEL
Cholesterol: 163 mg/dL (ref 0–200)
HDL: 60 mg/dL (ref >39)
LDL Cholesterol: 82 mg/dL (ref 0–99)
Total CHOL/HDL Ratio: 2.7 RATIO
Triglycerides: 107 mg/dL (ref <150)
VLDL: 21 mg/dL (ref 0–40)

## 2011-05-25 LAB — CBC
HCT: 42.3 % (ref 39.0–52.0)
Hemoglobin: 14 g/dL (ref 13.0–17.0)
MCH: 31.9 pg (ref 26.0–34.0)
MCHC: 33.1 g/dL (ref 30.0–36.0)
MCV: 96.4 fL (ref 78.0–100.0)
Platelets: 165 10*3/uL (ref 150–400)
RBC: 4.39 MIL/uL (ref 4.22–5.81)
RDW: 13.8 % (ref 11.5–15.5)
WBC: 6.8 10*3/uL (ref 4.0–10.5)

## 2011-05-25 LAB — CARDIAC PANEL(CRET KIN+CKTOT+MB+TROPI)
CK, MB: 5.7 ng/mL — ABNORMAL HIGH (ref 0.3–4.0)
Relative Index: 1.7 (ref 0.0–2.5)
Total CK: 342 U/L — ABNORMAL HIGH (ref 7–232)
Troponin I: <0.3 ng/mL (ref <0.30)

## 2011-05-25 LAB — PHOSPHORUS: Phosphorus: 3 mg/dL (ref 2.3–4.6)

## 2011-05-25 LAB — CORTISOL: Cortisol, Plasma: 5 ug/dL

## 2011-05-25 LAB — MAGNESIUM: Magnesium: 2 mg/dL (ref 1.5–2.5)

## 2011-05-27 LAB — THC (MARIJUANA), URINE, CONFIRMATION: Marijuana, Ur-Confirmation: 84 NG/ML — ABNORMAL HIGH

## 2011-05-27 LAB — OPIATE, QUANTITATIVE, URINE
Codeine Urine: NEGATIVE NG/ML
Hydrocodone: 2064 NG/ML — ABNORMAL HIGH
Hydromorphone GC/MS Conf: 148 NG/ML — ABNORMAL HIGH
Morphine, Confirm: NEGATIVE NG/ML
Oxycodone, ur: NEGATIVE NG/ML
Oxymorphone: NEGATIVE NG/ML

## 2011-05-30 ENCOUNTER — Encounter (INDEPENDENT_AMBULATORY_CARE_PROVIDER_SITE_OTHER): Payer: 59

## 2011-05-30 DIAGNOSIS — I498 Other specified cardiac arrhythmias: Secondary | ICD-10-CM

## 2011-06-10 NOTE — H&P (Signed)
Jason [name], Jason [name]            ACCOUNT NO.:  [id number]  MEDICAL RECORD NO.:  [id number]  LOCATION:  [facility]                         FACILITY:  [facility]  PHYSICIAN:  Rock Nephew, MD       DATE OF BIRTH:  1956-07-11  DATE OF ADMISSION:  05/24/2011 DATE OF DISCHARGE:                             HISTORY & PHYSICAL   PRIMARY CARE PHYSICIAN:  [name], MD  CHIEF COMPLAINT:  Presyncope with symptomatic bradycardia.  HISTORY OF PRESENT ILLNESS:  This is a 55 year old male with past medical history of hypertension who comes in chief complaint of presyncope.  The patient reports that he was in his usual state of health till this morning.  The patient got up this morning at 6:30 and for about 45 minutes, he felt like he was going to pass out.  The patient did not have any chest pain, any shortness of breath, any nausea or any vomiting.  The patient reports that he had a similar episode about a year ago.  The patient also reports that yesterday he felt that he had some blurred vision.  He denied any fevers, any chills, any heart palpitations.  He was brought into the emergency department.  In the emergency department, the patient's EKG showed heart rate about 45.  The patient has consistently stated in the 40s on the telemetry.  His EKG had peaked T-waves in lead V2.  There was no other significant ST or T- wave changes.  He had a chest x-ray which showed no acute cardiopulmonary abnormality.  His laboratory studies had mildly elevated potassium of 5.3.  His tropinin-I was negative.  His CK-MB was high at 7.6.  We are asked to admit this patient with symptomatic bradycardia and presyncope.  PAST MEDICAL HISTORY:  Hypertension.  FAMILY HISTORY:  Cancer, diabetes, coronary artery disease, hypertension, and stroke.  SURGICAL HISTORY:  None.  SOCIAL HISTORY:  The patient is a nonsmoker, occasional drinker.  He does not use any illicit drugs.  He is functional, deny any works  as believed to be and works as Journalist, newspaper.  ALLERGIES:  He has no known drug allergies.  HOME MEDICATIONS:  Lisinopril 10 mg p.o. daily.  REVIEW OF SYSTEMS:  The patient denies any headaches.  He got some blurred vision yesterday.  He denies any chest pain, any shortness of breath.  He denies any nausea, any vomiting.  He denies any abdominal pain.  He denies any constipation, any diarrhea.  He denies any burning on urination.  He denies any pain in his legs.  He denies any problem moving any specific part of his body.  He denies any focal neurological deficits.  PHYSICAL EXAMINATION:  VITAL SIGNS:  The patient's orthostatics were negative.  Temperature 97.4, blood pressure 133/78, pulse rate 43, respiratory rate 18, he has 99% saturation on room air. HEAD, EYES, EARS, NOSE AND THROAT:  Normocephalic, atraumatic.  Pupils equally round, reactive to light. CARDIOVASCULAR:  S1, S2, sinus bradycardia. LUNGS:  Clear to auscultation bilaterally.  No wheezes.  No rhonchi. ABDOMEN:  Soft, nontender, nondistended.  Bowel sounds are positive.  No guarding.  No rebound tenderness. EXTREMITIES:  No lower extremity edema evident. NEUROLOGIC:  The patient is alert, awake and oriented x3.  Cranial nerves II-XII are grossly intact.  The patient's 12-lead EKG shows marked sinus bradycardia.  Intervals are within normal limits.  The patient has evidence of peaked T-wave in lead V2.  RADIOLOGICAL STUDIES:  Chest x-ray 2-view shows no acute cardiopulmonary abnormality.  LABORATORY STUDIES:  The patient's WBC count is 6.3, hemoglobin is 14.3, hematocrit 43.4, MCV 96.0, platelets are 154, neutrophils 72.  Sodium 136, potassium 5.3, chloride 102, bicarbonate 30, BUN 13, creatinine 0.94, glucose 130, total bili 0.4, alk phos 80, AST 21, ALT 32, total protein 7.3, albumin 3.7, calcium 9.3, magnesium 2.1, troponin-I is less than 0.30, creatine kinase is 409, CK-MB 7.6, specific gravity greater than  1.030, negative leukocytes, negative nitrites.  IMPRESSION AND PLAN:  A 55 year old male with a history of hypertension admitted for presyncope symptomatic bradycardia. 1. Presyncope, symptomatic bradycardia, etiology is not clear.  We     will check a thyroid function test from the patient.  We will check     a 2-D echocardiogram for the patient.  The patient will be admitted     under observation to telemetry bed.  I have already consulted     [facility] to see the patient for symptomatic bradycardia     and again, the patient will get a 2-D echocardiogram to be     evaluated his heart for structural abnormalities. 2. Hyperkalemia.  The patient has hyperkalemia.  There is peaked T-     waves on lead V2.  The patient will get 1 amp of calcium gluconate.     The patient will get Kayexalate and lactulose.  I will stop the     lisinopril.  The lisinopril should not be started on discharge. 3. Hypertension.  Currently, we will monitor the patient's blood     pressure.  If the patient's blood pressure becomes elevated, the     patient will be started on other antihypertensives possibly such as     5 mg of amlodipine.  For DVT prophylaxis, the patient will get     Lovenox 40 mg subcu daily.  CODE STATUS:  I did discuss code status with the patient.  The patient wishes to be a full code.     Rock Nephew, MD     NH/MEDQ  D:  05/24/2011  T:  05/24/2011  Job:  [id number]  Electronically Signed by Rock Nephew MD on 06/10/2011 11:35:40 PM

## 2011-06-16 NOTE — Consult Note (Signed)
[name], [name]            ACCOUNT NO.:  [id number]  MEDICAL RECORD NO.:  [id number]  LOCATION:  3731                         FACILITY:  [facility]  PHYSICIAN:  Bevelyn Buckles. Nuel Dejaynes, MDDATE OF BIRTH:  05-15-1956  DATE OF CONSULTATION:  05/24/2011 DATE OF DISCHARGE:                                CONSULTATION   PRIMARY CARE PHYSICIAN:  [name], MD  PRIMARY CARDIOLOGIST:  None.  CHIEF COMPLAINT:  Near syncope.  HISTORY OF PRESENT ILLNESS:  Jason [name] is a 55 year old male with no previous cardiac history.  He has a long history of infrequent syncope. All episodes are the pain.  It starts with a buzzing sounds and then he gets flushed and weak.  He gets diaphoretic.  He will feel his heart slow.  There is no chest pain or shortness of breath.  He does not lose hearing and his wife says that during one episode, which was witnessed, he seemed to stiffen up and his eyes rolled back.  He did not respond to her for period of less than 30 seconds.  After the episode is over, he feels very tired for at least 30 minutes.  The episodes have never been associated with exertion and he has never had one while driving.  He also has had rare episodes of dizziness that do not progressed to his usual presyncope.  He has no history of vasovagal response.  He has had five or six episodes over the last 30 years.  He had one about a year and half ago and today just after he got out of bed, he had the onset of his usual symptoms.  They lasted longer than usual and his wife insisted he come to the emergency room.  He was still mildly symptomatic when he came to the emergency room and his blood pressure was within normal limits, although his was heart rate was down on the 40s.  Currently, he is in sinus bradycardia in the 50s and occasionally drops into the 40s. He is resting comfortably.  PAST MEDICAL HISTORY: 1. Hypertension. 2. History of hyperglycemia without diabetes. 3. Mild  obesity with a body mass index of 32. 4. Remote history of Bell's palsy.  SURGICAL HISTORY:  Colonoscopy.  ALLERGIES:  No known drug allergies.  CURRENT MEDICATIONS:  Lisinopril 10 mg a day.  SOCIAL HISTORY:  He lives in [place] with his wife and works as an Adult nurse.  He has no history of alcohol, tobacco, or drug abuse.  FAMILY HISTORY:  His mother died in her 12s with a stroke and his father died from trauma in his 63s.  His parents had no history of coronary artery disease, but one sister has a history of coronary artery disease. He is not aware of any history of sudden death in his family and no relatives have required a pacemaker.  REVIEW OF SYSTEMS:  Presyncope as described above.  He denies any orthostatic dizziness or weakness.  He never gets chest pain.  He denies lower extremity edema.  There is no history of shortness of breath or dyspnea on exertion.  He gets occasional arthralgias.  He denies reflux symptoms or melena.  He has not been  ill recently and has no history of fevers or chills.  Full 14-point review of systems is otherwise negative except as stated in the HPI.  PHYSICAL EXAMINATION:  VITAL SIGNS:  Temperature is 97.4, blood pressure 128/61, heart rate 43, respiratory rate 18, O2 saturation 99% on room air. GENERAL:  He is a well-developed, well-nourished, African American male in no acute distress. HEENT:  Normal. NECK:  There is no lymphadenopathy, thyromegaly, bruit, or JVD noted. CV:  His heart is regular in rate and rhythm with an S1-S2 and no significant murmur, rub, or gallop is noted.  Distal pulses are intact in all four extremities. LUNGS:  Clear to auscultation bilaterally. SKIN:  No rashes or lesions are noted. ABDOMEN:  Soft and nontender with active bowel sounds. EXTREMITIES:  There is no cyanosis, clubbing, or edema noted. MUSCULOSKELETAL:  There is no joint deformity or effusions and no spine or CVA tenderness. NEUROLOGIC:  He  is alert and oriented with cranial nerves II through XII grossly intact.  Chest x-ray; no acute disease.  EKG; sinus bradycardia, rate 45 with increased T-waves in V2, but no acute ischemic changes and there is no old available for comparison.  LABORATORY VALUES:  Hemoglobin 14.3, hematocrit 43.4, WBC 6.3, platelets 164.  Sodium 136, potassium 5.3, chloride 102, CO2 30, BUN 13, creatinine 0.94, glucose 130.  CK-MB 409/7.6 with an index of 1.9 and troponin-I less than 0.30.  IMPRESSION:  Jason [name] was seen today by Dr. Gala Romney, the patient evaluated and the data reviewed.  He is a 55 year old male with recurrent presyncope in the setting of significant sinus bradycardia. He has poor heart rate response to arm exercises.  We agree with admitting him.  He should be watched on telemetry.  Thyroid function studies and an echocardiogram had been ordered by primary care.  We will need to decide if he needs a pacemaker versus a 2-week event monitor and we will continue to follow him closely with you.     Theodore Demark, PA-C   ______________________________ Bevelyn Buckles. Carney Saxton, MD    RB/MEDQ  D:  05/24/2011  T:  05/25/2011  Job:  [id number]  Electronically Signed by Theodore Demark PA-C on 06/10/2011 11:09:17 AM Electronically Signed by Arvilla Meres MD on 06/16/2011 03:25:10 PM

## 2011-06-17 ENCOUNTER — Telehealth: Payer: Self-pay | Admitting: *Deleted

## 2011-06-17 NOTE — Telephone Encounter (Signed)
Pt aware of monitor results, NSR no arrhythmias  

## 2011-06-20 ENCOUNTER — Other Ambulatory Visit (INDEPENDENT_AMBULATORY_CARE_PROVIDER_SITE_OTHER): Payer: 59

## 2011-06-20 DIAGNOSIS — T887XXA Unspecified adverse effect of drug or medicament, initial encounter: Secondary | ICD-10-CM

## 2011-06-20 DIAGNOSIS — IMO0001 Reserved for inherently not codable concepts without codable children: Secondary | ICD-10-CM

## 2011-06-20 DIAGNOSIS — E785 Hyperlipidemia, unspecified: Secondary | ICD-10-CM

## 2011-06-20 LAB — BASIC METABOLIC PANEL
BUN: 12 mg/dL (ref 6–23)
CO2: 28 mEq/L (ref 19–32)
Calcium: 9 mg/dL (ref 8.4–10.5)
Chloride: 105 mEq/L (ref 96–112)
Creatinine, Ser: 1.3 mg/dL (ref 0.4–1.5)
GFR: 74.45 mL/min (ref >60.00)
Glucose, Bld: 125 mg/dL — ABNORMAL HIGH (ref 70–99)
Potassium: 4.3 mEq/L (ref 3.5–5.1)
Sodium: 141 mEq/L (ref 135–145)

## 2011-06-20 LAB — HEPATIC FUNCTION PANEL
ALT: 34 U/L (ref 0–53)
AST: 25 U/L (ref 0–37)
Albumin: 4.3 g/dL (ref 3.5–5.2)
Alkaline Phosphatase: 66 U/L (ref 39–117)
Bilirubin, Direct: 0 mg/dL (ref 0.0–0.3)
Total Bilirubin: 0.3 mg/dL (ref 0.3–1.2)
Total Protein: 7.6 g/dL (ref 6.0–8.3)

## 2011-06-20 LAB — LIPID PANEL
Cholesterol: 175 mg/dL (ref 0–200)
HDL: 66.3 mg/dL (ref >39.00)
LDL Cholesterol: 92 mg/dL (ref 0–99)
Total CHOL/HDL Ratio: 3
Triglycerides: 86 mg/dL (ref 0.0–149.0)
VLDL: 17.2 mg/dL (ref 0.0–40.0)

## 2011-06-20 LAB — HEMOGLOBIN A1C: Hgb A1c MFr Bld: 7.5 % — ABNORMAL HIGH (ref 4.6–6.5)

## 2011-06-27 ENCOUNTER — Ambulatory Visit: Payer: Self-pay | Admitting: Internal Medicine

## 2011-06-30 ENCOUNTER — Encounter: Payer: Self-pay | Admitting: Internal Medicine

## 2011-06-30 NOTE — Discharge Summary (Signed)
Jason [name], Jason [name]            ACCOUNT NO.:  [id number]  MEDICAL RECORD NO.:  [id number]  LOCATION:  3731                         FACILITY:  [facility]  PHYSICIAN:  Calvert Cantor, M.D.     DATE OF BIRTH:  11/08/1956  DATE OF ADMISSION:  05/24/2011 DATE OF DISCHARGE:  05/25/2011                              DISCHARGE SUMMARY   PRIMARY CARE PHYSICIAN:  [name], MD  PRESENTING COMPLAINT:  Bradycardia and near syncope.  DISCHARGE DIAGNOSES: 1. Bradycardia, resulting in her syncope. 2. Hyperkalemia, possibly exacerbating his bradycardia. 3. Newly diagnosed diabetes mellitus, non-insulin requiring.  PAST MEDICAL HISTORY:  Hypertension.  CONSULTS DURING HOSPITAL STAY:  Cardiology consult.  The patient was evaluated by Dr. Gala Romney.  DISCHARGE MEDICATIONS:  New medication:  HCTZ 25 mg daily.  Hold the following medication:  Lisinopril, he was on 10 mg daily.  Continue of the following medications:  Aleve 220 mg 2 tablets every 12 hours as needed for toothache.  PROCEDURES:  Two-D echo performed on May 25, 2011, reveals an EF of 50- 60% with normal wall motion.  There is a mildly calcified mitral annulus.  No other abnormality is noted.  PERTINENT BLOOD WORK RESULTS:  Hemoglobin A1c was noted to be 7.0. Potassium on admission 5.3, potassium on discharge 4.1.  Thyroid studies revealed a TSH which was normal at 3.397.  T4 was 7.6, also normal. Free T for 0.92, also normal.  Lipid profile checked fasting was within normal range, LDL was 82 which is slightly elevated for a diabetic.  Urine drug screen revealed marijuana and opiates.  HOSPITAL COURSE:  This is a 55 year old male who came into the hospital feeling like he was going to pass out.  Episode lasted for about 45 minutes.  According to the patient and his family, he has had an episode like this about a year ago and about 6-8 episodes over the past 10-15 years.  The patient was admitted and monitored on  telemetry.  Cardiology consult was requested.  He was noted to have elevated potassium, which was corrected with Kayexalate and calcium gluconate.  His lisinopril was held as well.  At this point, he will be set up with [name] for a 2-week event monitor, after which he will follow up in the office with them for the results.  In regard to his hyperkalemia, lisinopril has been held.  I am uncertain if this was exactly the cause.  However, we have held lisinopril and started HCTZ instead.  He is to follow up with his PCP for blood pressure checks.  He is also found to have diabetes with an A1c of 7.0.  He has received education from our nutritionist and from our diabetes nurse educator. In addition, I have advised him to lose weight to help better control his diabetes and hypertension.  PHYSICAL EXAMINATION:  LUNGS:  Clear bilaterally. HEART:  Regular rate and rhythm.  No murmurs. ABDOMEN:  Soft, nontender, nondistended.  Bowel sounds positive. EXTREMITIES:  No cyanosis, clubbing or edema.  The patient states that he is no longer having the presyncopal feeling that he was admitted with.  CONDITION ON DISCHARGE:  Stable.  FOLLOWUP INSTRUCTIONS: 1. Follow up with  Dr. Birdie Sons in 1 week. 2. Follow up with [name] in 1-2 weeks for event monitor.  Time on discharge was 45 minutes.     Calvert Cantor, M.D.     SR/MEDQ  D:  05/25/2011  T:  05/26/2011  Job:  [id number]  cc:   [name], MD  Electronically Signed by Calvert Cantor M.D. on 06/30/2011 12:10:51 PM

## 2011-07-04 ENCOUNTER — Ambulatory Visit (INDEPENDENT_AMBULATORY_CARE_PROVIDER_SITE_OTHER): Payer: 59 | Admitting: Internal Medicine

## 2011-07-04 ENCOUNTER — Encounter: Payer: Self-pay | Admitting: Internal Medicine

## 2011-07-04 VITALS — BP 140/90 | HR 70 | Resp 18 | Ht 75.0 in | Wt 266.4 lb

## 2011-07-04 DIAGNOSIS — R55 Syncope and collapse: Secondary | ICD-10-CM | POA: Insufficient documentation

## 2011-07-04 MED ORDER — DILTIAZEM HCL ER COATED BEADS 180 MG PO CP24: 180.0000 mg | ORAL_CAPSULE | Freq: Every day | ORAL | Status: DC

## 2011-07-04 NOTE — Assessment & Plan Note (Signed)
Because of the hypothesis that neurally mediated syncope is the explanation, intravascular bleeding agents like a diuretic are probably not ideal. Changing diltiazem. I will leave his blood pressure management to his primary care physician.

## 2011-07-04 NOTE — Progress Notes (Signed)
HPI: Jason [name] is a 55 y.o. male Seen following an admission at the hospital for syncope.  He has a history of hypertension treated previously with lisinopril which was discontinued because of high potassium. He was on at the time.  His syncope history goes back about 40 years. He has had a half dozen or so episodes. They're characterized by a stereo typical prodrome notable for lightheadedness flushing nausea and progressive lightheadedness. He has persistent orthostatic intolerance for numbers of minutes. At his last episode he had gotten himself up to go to work. He felt all right at the beginning. He started having his prodrome. He sat down. The symptoms abated gradually mildly. He stood up because they had persisted to some degree he felt worse as he got up and it went down to lay down on his bed. According to the reports he was out for a few moments. These episodes are characterized by residual fatigue. They're occasionally preceded the night before by heavy alcohol use.   Current Outpatient Prescriptions  Medication Sig Dispense Refill  . hydrochlorothiazide 25 MG tablet Take 25 mg by mouth daily.          No Known Allergies  Past Medical History  Diagnosis Date  . HTN (hypertension)   . Hyperglycemia     Past Surgical History  Procedure Date  . Bellpalsy     Family History  Problem Relation Age of Onset  . Stroke Mother   . Colon cancer Brother   . Colon cancer Other     1st degree relative <60  . Diabetes Other     1st degree relative   . Stroke Other     History   Social History  . Marital Status: Married    Spouse Name: N/A    Number of Children: 2  . Years of Education: N/A   Occupational History  . [phone]    Social History Main Topics  . Smoking status: Never Smoker   . Smokeless tobacco: Not on file  . Alcohol Use: Yes     on weekends  . Drug Use: No  . Sexually Active: Not on file   Other Topics Concern  . Not on file    Social History Narrative  . No narrative on file    Fourteen point review of systems was negative except as noted in HPI and PMH   PHYSICAL EXAMINATION  Blood pressure 140/90, pulse 70, resp. rate 18, height 6\' 3"  (1.905 m), weight 266 lb 6.4 oz (120.838 kg).   Well developed and nourished  Middle-aged [place] American male in no acute distress HENT normal Neck supple with JVP-flat Carotids brisk and full without bruits Back without scoliosis or kyphosis Clear Regular rate and rhythm, no murmurs or gallops Abd-soft with active BS without hepatomegaly or midline pulsation Femoral pulses 2+ distal pulses intact No Clubbing cyanosis edema Skin-warm and dry LN-neg submandibular and supraclavicular A & Oriented CN 3-12 normal  Grossly normal sensory and motor function Affect engaging . ECG today demonstrates sinus rhythm at 70 with intervals of 0.15/0.09/0.37 axis was 45 and was otherwise normal  Event recorder was reviewed and demonstrated sinus rhythm

## 2011-07-04 NOTE — Patient Instructions (Addendum)
Your physician has recommended you make the following change in your medication:  1) Stop Hydrochlorothiazide (HCTZ). 2) Start Diltiazem 180mg  one capsule daily.  We will see you back on an as needed basis.

## 2011-07-04 NOTE — Assessment & Plan Note (Signed)
The patient has had infrequent episodes of what is quite consistent with neurally mediated syncope. They have been stereotypical and have persisted over decades but thankfully has been very infrequent. We have discussed the physiology. There no clear triggers. He is encouraged to become supine with the onset of the symptoms. We have discussed the importance of maintaining hydration and for that reason we will discontinue his hydrochlorothiazide as it as a diuretic agent and place him on diltiazem 180 daily. He will follow up with his PCP about this.

## 2011-07-06 ENCOUNTER — Ambulatory Visit: Payer: Self-pay | Admitting: Internal Medicine

## 2011-08-08 ENCOUNTER — Ambulatory Visit: Payer: Self-pay | Admitting: Internal Medicine

## 2012-06-12 DIAGNOSIS — E119 Type 2 diabetes mellitus without complications: Secondary | ICD-10-CM | POA: Insufficient documentation

## 2012-07-24 ENCOUNTER — Other Ambulatory Visit: Payer: Self-pay | Admitting: Internal Medicine

## 2012-09-12 ENCOUNTER — Encounter: Payer: Self-pay | Admitting: Family

## 2012-09-12 ENCOUNTER — Ambulatory Visit (INDEPENDENT_AMBULATORY_CARE_PROVIDER_SITE_OTHER): Payer: 59 | Admitting: Family

## 2012-09-12 VITALS — HR 77 | Temp 98.2°F | Ht 74.5 in | Wt 255.0 lb

## 2012-09-12 DIAGNOSIS — Z23 Encounter for immunization: Secondary | ICD-10-CM

## 2012-09-12 DIAGNOSIS — I10 Essential (primary) hypertension: Secondary | ICD-10-CM

## 2012-09-12 DIAGNOSIS — E119 Type 2 diabetes mellitus without complications: Secondary | ICD-10-CM

## 2012-09-12 DIAGNOSIS — Z0000 Encounter for general adult medical examination without abnormal findings: Secondary | ICD-10-CM

## 2012-09-12 LAB — BASIC METABOLIC PANEL
BUN: 19 mg/dL (ref 6–23)
CO2: 29 mEq/L (ref 19–32)
Calcium: 9.5 mg/dL (ref 8.4–10.5)
Chloride: 106 mEq/L (ref 96–112)
Creatinine, Ser: 1.3 mg/dL (ref 0.4–1.5)
GFR: 72.82 mL/min (ref >60.00)
Glucose, Bld: 114 mg/dL — ABNORMAL HIGH (ref 70–99)
Potassium: 4.9 mEq/L (ref 3.5–5.1)
Sodium: 140 mEq/L (ref 135–145)

## 2012-09-12 LAB — LIPID PANEL
Cholesterol: 169 mg/dL (ref 0–200)
HDL: 65.5 mg/dL (ref >39.00)
LDL Cholesterol: 92 mg/dL (ref 0–99)
Total CHOL/HDL Ratio: 3
Triglycerides: 58 mg/dL (ref 0.0–149.0)
VLDL: 11.6 mg/dL (ref 0.0–40.0)

## 2012-09-12 LAB — CBC WITH DIFFERENTIAL/PLATELET
Basophils Absolute: 0 10*3/uL (ref 0.0–0.1)
Basophils Relative: 0.7 % (ref 0.0–3.0)
Eosinophils Absolute: 0.2 10*3/uL (ref 0.0–0.7)
Eosinophils Relative: 3.5 % (ref 0.0–5.0)
HCT: 46 % (ref 39.0–52.0)
Hemoglobin: 15.2 g/dL (ref 13.0–17.0)
Lymphocytes Relative: 42.2 % (ref 12.0–46.0)
Lymphs Abs: 2.3 10*3/uL (ref 0.7–4.0)
MCHC: 33.1 g/dL (ref 30.0–36.0)
MCV: 97.2 fl (ref 78.0–100.0)
Monocytes Absolute: 0.4 10*3/uL (ref 0.1–1.0)
Monocytes Relative: 6.7 % (ref 3.0–12.0)
Neutro Abs: 2.6 10*3/uL (ref 1.4–7.7)
Neutrophils Relative %: 46.9 % (ref 43.0–77.0)
Platelets: 168 10*3/uL (ref 150.0–400.0)
RBC: 4.73 Mil/uL (ref 4.22–5.81)
RDW: 14.6 % (ref 11.5–14.6)
WBC: 5.6 10*3/uL (ref 4.5–10.5)

## 2012-09-12 LAB — PSA: PSA: 0.7 ng/mL (ref 0.10–4.00)

## 2012-09-12 LAB — HEMOGLOBIN A1C: Hgb A1c MFr Bld: 6.7 % — ABNORMAL HIGH (ref 4.6–6.5)

## 2012-09-12 NOTE — Patient Instructions (Signed)
Diabetes and Exercise  Regular exercise is important and can help:   · Control blood glucose (sugar).  · Decrease blood pressure.  ·   · Control blood lipids (cholesterol, triglycerides).  · Improve overall health.  BENEFITS FROM EXERCISE  · Improved fitness.  · Improved flexibility.  · Improved endurance.  · Increased bone density.  · Weight control.  · Increased muscle strength.  · Decreased body fat.  · Improvement of the body's use of insulin, a hormone.  · Increased insulin sensitivity.  · Reduction of insulin needs.  · Reduced stress and tension.  · Helps you feel better.  People with diabetes who add exercise to their lifestyle gain additional benefits, including:  · Weight loss.  · Reduced appetite.  · Improvement of the body's use of blood glucose.  · Decreased risk factors for heart disease:  · Lowering of cholesterol and triglycerides.  · Raising the level of good cholesterol (high-density lipoproteins, HDL).  · Lowering blood sugar.  · Decreased blood pressure.  TYPE 1 DIABETES AND EXERCISE  · Exercise will usually lower your blood glucose.  · If blood glucose is greater than 240 mg/dl, check urine ketones. If ketones are present, do not exercise.  · Location of the insulin injection sites may need to be adjusted with exercise. Avoid injecting insulin into areas of the body that will be exercised. For example, avoid injecting insulin into:  · The arms when playing tennis.  · The legs when jogging. For more information, discuss this with your caregiver.  · Keep a record of:  · Food intake.  · Type and amount of exercise.  · Expected peak times of insulin action.  · Blood glucose levels.  Do this before, during, and after exercise. Review your records with your caregiver. This will help you to develop guidelines for adjusting food intake and insulin amounts.   TYPE 2 DIABETES AND EXERCISE  · Regular physical activity can help control blood glucose.  · Exercise is important because it may:  · Increase the  body's sensitivity to insulin.  · Improve blood glucose control.  · Exercise reduces the risk of heart disease. It decreases serum cholesterol and triglycerides. It also lowers blood pressure.  · Those who take insulin or oral hypoglycemic agents should watch for signs of hypoglycemia. These signs include dizziness, shaking, sweating, chills, and confusion.  · Body water is lost during exercise. It must be replaced. This will help to avoid loss of body fluids (dehydration) or heat stroke.  Be sure to talk to your caregiver before starting an exercise program to make sure it is safe for you. Remember, any activity is better than none.   Document Released: 02/18/2004 Document Revised: 02/20/2012 Document Reviewed: 06/04/2009  ExitCare® Patient Information ©2013 [employer].

## 2012-09-12 NOTE — Progress Notes (Signed)
Subjective:    Patient ID: Jason [name], male    DOB: 07-23-1956, 56 y.o.   MRN: [id number]  HPI 56 year old african [place] male, patient of Dr. Cato Mulligan is in today for a CPX, Type 2 diabetes, and hypertension. He does not routinely exercise.   Patient presents for yearly preventative medicine examination. All immunizations and health maintenance protocols were reviewed with the patient and they are up to date with these protocols. Screening laboratory values were reviewed with the patient including screening of hyperlipidemia PSA renal function and hepatic function. There medications past medical history social history problem list and allergies were reviewed in detail. Goals were established with regard to weight loss exercise diet in compliance with medications.    Review of Systems  Constitutional: Negative.   HENT: Negative.   Eyes: Negative.   Respiratory: Negative.   Cardiovascular: Negative.   Gastrointestinal: Negative.   Genitourinary: Negative.   Musculoskeletal: Negative.   Neurological: Negative.   Hematological: Negative.   Psychiatric/Behavioral: Negative.    Past Medical History  Diagnosis Date  . HTN (hypertension)   . Hyperglycemia     History   Social History  . Marital Status: Married    Spouse Name: N/A    Number of Children: 2  . Years of Education: N/A   Occupational History  . [phone]#    Social History Main Topics  . Smoking status: Never Smoker   . Smokeless tobacco: Not on file  . Alcohol Use: Yes     on weekends  . Drug Use: No  . Sexually Active: Not on file   Other Topics Concern  . Not on file   Social History Narrative  . No narrative on file    Past Surgical History  Procedure Date  . Bellpalsy     Family History  Problem Relation Age of Onset  . Stroke Mother   . Colon cancer Brother   . Colon cancer Other     1st degree relative <60  . Diabetes Other     1st degree relative   . Stroke Other      Allergies  Allergen Reactions  . Lisinopril     hyperkalemia    Current Outpatient Prescriptions on File Prior to Visit  Medication Sig Dispense Refill  . diltiazem (CARDIZEM CD) 180 MG 24 hr capsule TAKE 1 CAPSULE (180 MG TOTAL) BY MOUTH DAILY.  30 capsule  8    Pulse 77  Temp 98.2 F (36.8 C) (Oral)  Ht 6' 2.5" (1.892 m)  Wt 255 lb (115.667 kg)  BMI 32.30 kg/m2  SpO2 97%chart    Objective:   Physical Exam  Constitutional: He is oriented to person, place, and time. He appears well-developed and well-nourished.  HENT:  Right Ear: External ear normal.  Left Ear: External ear normal.  Nose: Nose normal.  Mouth/Throat: Oropharynx is clear and moist.  Eyes: Conjunctivae normal are normal. Pupils are equal, round, and reactive to light.  Neck: Normal range of motion. Neck supple. No thyromegaly present.  Cardiovascular: Normal rate, regular rhythm and normal heart sounds.   Pulmonary/Chest: Effort normal and breath sounds normal.  Abdominal: Soft. Bowel sounds are normal. There is no tenderness. There is no rebound and no guarding.  Genitourinary: Rectum normal, prostate normal and penis normal. Guaiac negative stool. No penile tenderness.  Musculoskeletal: Normal range of motion.  Neurological: He is alert and oriented to person, place, and time. He has normal reflexes. He displays normal reflexes. No  cranial nerve deficit. Coordination normal.  Skin: Skin is warm and dry.  Psychiatric: He has a normal mood and affect.          Assessment & Plan:  Assessment: CPX, Type 2 Diabetes, Hypertension  Plan: Labs sent. Encouraged a healthy diet and exercise. Follow-up pending results. Follow-up pending results and sooner as needed.

## 2013-08-17 ENCOUNTER — Other Ambulatory Visit: Payer: Self-pay | Admitting: Internal Medicine

## 2013-10-14 ENCOUNTER — Other Ambulatory Visit: Payer: Self-pay | Admitting: Internal Medicine

## 2013-12-22 ENCOUNTER — Other Ambulatory Visit: Payer: Self-pay | Admitting: Internal Medicine

## 2014-03-03 ENCOUNTER — Other Ambulatory Visit: Payer: Self-pay | Admitting: Internal Medicine

## 2014-03-04 NOTE — Telephone Encounter (Signed)
Patient not seen since 2012, but was told to follow up as needed, but it has been almost 3 years. Ok to refill? Please advise. Thanks, MI

## 2014-03-07 ENCOUNTER — Other Ambulatory Visit: Payer: Self-pay

## 2014-03-07 MED ORDER — DILTIAZEM HCL ER COATED BEADS 180 MG PO CP24: ORAL_CAPSULE | ORAL | Status: DC

## 2014-03-07 NOTE — Telephone Encounter (Signed)
Called patient's listed cell phone number - person that answered claimed to be pt's brother. I asked person that answered to have pt call us back about a medication.

## 2014-03-10 ENCOUNTER — Telehealth: Payer: Self-pay

## 2014-03-10 NOTE — Telephone Encounter (Signed)
Pt's wife called to request a refill of cardizem. Rx was filled by cardiology. Wife advised that pt has not been seen by cardiology since 2012 and by [facility] since 2013 therefore he will need a follow up

## 2014-03-12 NOTE — Telephone Encounter (Signed)
Left another message to call back to discuss this.

## 2014-10-08 ENCOUNTER — Other Ambulatory Visit: Payer: Self-pay | Admitting: Internal Medicine

## 2015-01-03 ENCOUNTER — Other Ambulatory Visit: Payer: Self-pay | Admitting: Internal Medicine

## 2015-12-10 ENCOUNTER — Other Ambulatory Visit: Payer: Self-pay | Admitting: Internal Medicine

## 2015-12-10 NOTE — Telephone Encounter (Signed)
Not seen since 2012- we can't refill this for him- he will need to have this filled by his PCP, or he can make an appointment to come in and be seen if he is having problems.  Thanks for asking! Alvis Lemmings, RN, BSN

## 2015-12-11 ENCOUNTER — Other Ambulatory Visit: Payer: Self-pay | Admitting: Internal Medicine

## 2016-02-02 ENCOUNTER — Ambulatory Visit (INDEPENDENT_AMBULATORY_CARE_PROVIDER_SITE_OTHER): Payer: 59 | Admitting: Family

## 2016-02-02 ENCOUNTER — Encounter: Payer: Self-pay | Admitting: Family

## 2016-02-02 ENCOUNTER — Other Ambulatory Visit (INDEPENDENT_AMBULATORY_CARE_PROVIDER_SITE_OTHER): Payer: 59

## 2016-02-02 VITALS — BP 142/94 | HR 69 | Temp 98.3°F | Resp 18 | Ht 74.5 in | Wt 270.0 lb

## 2016-02-02 DIAGNOSIS — Z0000 Encounter for general adult medical examination without abnormal findings: Secondary | ICD-10-CM

## 2016-02-02 DIAGNOSIS — Z9189 Other specified personal risk factors, not elsewhere classified: Secondary | ICD-10-CM

## 2016-02-02 DIAGNOSIS — Z23 Encounter for immunization: Secondary | ICD-10-CM

## 2016-02-02 DIAGNOSIS — I10 Essential (primary) hypertension: Secondary | ICD-10-CM | POA: Diagnosis not present

## 2016-02-02 DIAGNOSIS — E119 Type 2 diabetes mellitus without complications: Secondary | ICD-10-CM

## 2016-02-02 LAB — PSA: PSA: 0.69 ng/mL (ref 0.10–4.00)

## 2016-02-02 LAB — CBC
HCT: 47.9 % (ref 39.0–52.0)
Hemoglobin: 16.1 g/dL (ref 13.0–17.0)
MCHC: 33.7 g/dL (ref 30.0–36.0)
MCV: 94.6 fl (ref 78.0–100.0)
Platelets: 178 10*3/uL (ref 150.0–400.0)
RBC: 5.06 Mil/uL (ref 4.22–5.81)
RDW: 14.1 % (ref 11.5–15.5)
WBC: 5.9 10*3/uL (ref 4.0–10.5)

## 2016-02-02 LAB — COMPREHENSIVE METABOLIC PANEL
ALT: 37 U/L (ref 0–53)
AST: 23 U/L (ref 0–37)
Albumin: 4.3 g/dL (ref 3.5–5.2)
Alkaline Phosphatase: 73 U/L (ref 39–117)
BUN: 13 mg/dL (ref 6–23)
CO2: 30 mEq/L (ref 19–32)
Calcium: 9.8 mg/dL (ref 8.4–10.5)
Chloride: 104 mEq/L (ref 96–112)
Creatinine, Ser: 1.34 mg/dL (ref 0.40–1.50)
GFR: 69.51 mL/min (ref >60.00)
Glucose, Bld: 203 mg/dL — ABNORMAL HIGH (ref 70–99)
Potassium: 5.4 mEq/L — ABNORMAL HIGH (ref 3.5–5.1)
Sodium: 140 mEq/L (ref 135–145)
Total Bilirubin: 0.4 mg/dL (ref 0.2–1.2)
Total Protein: 7.4 g/dL (ref 6.0–8.3)

## 2016-02-02 LAB — LIPID PANEL
Cholesterol: 182 mg/dL (ref 0–200)
HDL: 57.8 mg/dL (ref >39.00)
LDL Cholesterol: 105 mg/dL — ABNORMAL HIGH (ref 0–99)
NonHDL: 124.14
Total CHOL/HDL Ratio: 3
Triglycerides: 94 mg/dL (ref 0.0–149.0)
VLDL: 18.8 mg/dL (ref 0.0–40.0)

## 2016-02-02 LAB — MICROALBUMIN / CREATININE URINE RATIO
Creatinine,U: 357.2 mg/dL
Microalb Creat Ratio: 2 mg/g (ref 0.0–30.0)
Microalb, Ur: 7 mg/dL — ABNORMAL HIGH (ref 0.0–1.9)

## 2016-02-02 LAB — TSH: TSH: 2.36 u[IU]/mL (ref 0.35–4.50)

## 2016-02-02 LAB — HEMOGLOBIN A1C: Hgb A1c MFr Bld: 10.6 % — ABNORMAL HIGH (ref 4.6–6.5)

## 2016-02-02 MED ORDER — DILTIAZEM HCL ER COATED BEADS 180 MG PO CP24: 180.0000 mg | ORAL_CAPSULE | Freq: Every day | ORAL | Status: DC

## 2016-02-02 NOTE — Assessment & Plan Note (Signed)
1) Anticipatory Guidance: Discussed importance of wearing a seatbelt while driving and not texting while driving; changing batteries in smoke detector at least once annually; wearing suntan lotion when outside; eating a balanced and moderate diet; getting physical activity at least 30 minutes per day.  2) Immunizations / Screenings / Labs:  Flu and tetanus updated today. Will schedule Zostavax and Pneumovax. All other immunizations are up to date per recommendations. Due for a colonoscopy with referral placed. Due for dental and vision screen to include a diabetic eye exam encouraged to be completed independently. Obtain A1c and microalbumen for diabetes screen. Obtain Hep C antibody for Hepatitis C screen. All other screenings are up to date per recommendations.  Obtain CBC,CMET, Lipid profile and TSH.   Overall well exam with risk factors for cardiovascular disease including hypertension and diabetes. Refill medication for hypertension. Continue to monitor blood pressure at home. Obtain A1c to check current diabetes status. Encouraged to increase physical activity to 30 minutes of moderate level activity daily. Consume a nutritional intake that is moderate, varied, and balanced and focuses on nutrient dense foods and is low in saturated fats and processed/sugary foods. Recommended weight loss of approximately 5-10% of current body weight through lifestyle changes. Continue other healthy lifestyle behaviors and choices. Follow-up prevention exam in 1 year. Follow-up office visit for chronic conditions pending blood work.

## 2016-02-02 NOTE — Patient Instructions (Addendum)
Thank you for choosing [facility].  Summary/Instructions:  Please schedule a time for your diabetic eye exam.  Please schedule a time for your dental screening.  Continue to monitor blood pressure at home.   Your prescription(s) have been submitted to your pharmacy or been printed and provided for you. Please take as directed and contact our office if you believe you are having problem(s) with the medication(s) or have any questions.  Please stop by the lab on the basement level of the building for your blood work. Your results will be released to [facility] (or called to you) after review, usually within 72 hours after test completion. If any changes need to be made, you will be notified at that same time.  Health Maintenance, Male A healthy lifestyle and preventative care can promote health and wellness.  Maintain regular health, dental, and eye exams.  Eat a healthy diet. Foods like vegetables, fruits, whole grains, low-fat dairy products, and lean protein foods contain the nutrients you need and are low in calories. Decrease your intake of foods high in solid fats, added sugars, and salt. Get information about a proper diet from your health care provider, if necessary.  Regular physical exercise is one of the most important things you can do for your health. Most adults should get at least 150 minutes of moderate-intensity exercise (any activity that increases your heart rate and causes you to sweat) each week. In addition, most adults need muscle-strengthening exercises on 2 or more days a week.   Maintain a healthy weight. The body mass index (BMI) is a screening tool to identify possible weight problems. It provides an estimate of body fat based on height and weight. Your health care provider can find your BMI and can help you achieve or maintain a healthy weight. For males 20 years and older:  A BMI below 18.5 is considered underweight.  A BMI of 18.5 to 24.9 is normal.  A  BMI of 25 to 29.9 is considered overweight.  A BMI of 30 and above is considered obese.  Maintain normal blood lipids and cholesterol by exercising and minimizing your intake of saturated fat. Eat a balanced diet with plenty of fruits and vegetables. Blood tests for lipids and cholesterol should begin at age 26 and be repeated every 5 years. If your lipid or cholesterol levels are high, you are over age 49, or you are at high risk for heart disease, you may need your cholesterol levels checked more frequently.Ongoing high lipid and cholesterol levels should be treated with medicines if diet and exercise are not working.  If you smoke, find out from your health care provider how to quit. If you do not use tobacco, do not start.  Lung cancer screening is recommended for adults aged 42-80 years who are at high risk for developing lung cancer because of a history of smoking. A yearly low-dose CT scan of the lungs is recommended for people who have at least a 30-pack-year history of smoking and are current smokers or have quit within the past 15 years. A pack year of smoking is smoking an average of 1 pack of cigarettes a day for 1 year (for example, a 30-pack-year history of smoking could mean smoking 1 pack a day for 30 years or 2 packs a day for 15 years). Yearly screening should continue until the smoker has stopped smoking for at least 15 years. Yearly screening should be stopped for people who develop a health problem that would prevent  them from having lung cancer treatment.  If you choose to drink alcohol, do not have more than 2 drinks per day. One drink is considered to be 12 oz (360 mL) of beer, 5 oz (150 mL) of wine, or 1.5 oz (45 mL) of liquor.  Avoid the use of street drugs. Do not share needles with anyone. Ask for help if you need support or instructions about stopping the use of drugs.  High blood pressure causes heart disease and increases the risk of stroke. High blood pressure is more  likely to develop in:  People who have blood pressure in the end of the normal range (100-139/85-89 mm Hg).  People who are overweight or obese.  People who are African American.  If you are 67-18 years of age, have your blood pressure checked every 3-5 years. If you are 46 years of age or older, have your blood pressure checked every year. You should have your blood pressure measured twice--once when you are at a hospital or clinic, and once when you are not at a hospital or clinic. Record the average of the two measurements. To check your blood pressure when you are not at a hospital or clinic, you can use:  An automated blood pressure machine at a pharmacy.  A home blood pressure monitor.  If you are 81-102 years old, ask your health care provider if you should take aspirin to prevent heart disease.  Diabetes screening involves taking a blood sample to check your fasting blood sugar level. This should be done once every 3 years after age 59 if you are at a normal weight and without risk factors for diabetes. Testing should be considered at a younger age or be carried out more frequently if you are overweight and have at least 1 risk factor for diabetes.  Colorectal cancer can be detected and often prevented. Most routine colorectal cancer screening begins at the age of 58 and continues through age 27. However, your health care provider may recommend screening at an earlier age if you have risk factors for colon cancer. On a yearly basis, your health care provider may provide home test kits to check for hidden blood in the stool. A small camera at the end of a tube may be used to directly examine the colon (sigmoidoscopy or colonoscopy) to detect the earliest forms of colorectal cancer. Talk to your health care provider about this at age 58 when routine screening begins. A direct exam of the colon should be repeated every 5-10 years through age 78, unless early forms of precancerous polyps or  small growths are found.  People who are at an increased risk for hepatitis B should be screened for this virus. You are considered at high risk for hepatitis B if:  You were born in a country where hepatitis B occurs often. Talk with your health care provider about which countries are considered high risk.  Your parents were born in a high-risk country and you have not received a shot to protect against hepatitis B (hepatitis B vaccine).  You have HIV or AIDS.  You use needles to inject street drugs.  You live with, or have sex with, someone who has hepatitis B.  You are a man who has sex with other men (MSM).  You get hemodialysis treatment.  You take certain medicines for conditions like cancer, organ transplantation, and autoimmune conditions.  Hepatitis C blood testing is recommended for all people born from [birth year] through 1967 and any individual  with known risk factors for hepatitis C.  Healthy men should no longer receive prostate-specific antigen (PSA) blood tests as part of routine cancer screening. Talk to your health care provider about prostate cancer screening.  Testicular cancer screening is not recommended for adolescents or adult males who have no symptoms. Screening includes self-exam, a health care provider exam, and other screening tests. Consult with your health care provider about any symptoms you have or any concerns you have about testicular cancer.  Practice safe sex. Use condoms and avoid high-risk sexual practices to reduce the spread of sexually transmitted infections (STIs).  You should be screened for STIs, including gonorrhea and chlamydia if:  You are sexually active and are younger than 24 years.  You are older than 24 years, and your health care provider tells you that you are at risk for this type of infection.  Your sexual activity has changed since you were last screened, and you are at an increased risk for chlamydia or gonorrhea. Ask your health  care provider if you are at risk.  If you are at risk of being infected with HIV, it is recommended that you take a prescription medicine daily to prevent HIV infection. This is called pre-exposure prophylaxis (PrEP). You are considered at risk if:  You are a man who has sex with other men (MSM).  You are a heterosexual man who is sexually active with multiple partners.  You take drugs by injection.  You are sexually active with a partner who has HIV.  Talk with your health care provider about whether you are at high risk of being infected with HIV. If you choose to begin PrEP, you should first be tested for HIV. You should then be tested every 3 months for as long as you are taking PrEP.  Use sunscreen. Apply sunscreen liberally and repeatedly throughout the day. You should seek shade when your shadow is shorter than you. Protect yourself by wearing long sleeves, pants, a wide-brimmed hat, and sunglasses year round whenever you are outdoors.  Tell your health care provider of new moles or changes in moles, especially if there is a change in shape or color. Also, tell your health care provider if a mole is larger than the size of a pencil eraser.  A one-time screening for abdominal aortic aneurysm (AAA) and surgical repair of large AAAs by ultrasound is recommended for men aged 75-75 years who are current or former smokers.  Stay current with your vaccines (immunizations).   This information is not intended to replace advice given to you by your health care provider. Make sure you discuss any questions you have with your health care provider.   Document Released: 05/26/2008 Document Revised: 12/19/2014 Document Reviewed: 04/25/2011 Elsevier Interactive Patient Education [employer].

## 2016-02-02 NOTE — Progress Notes (Signed)
Subjective:    Patient ID: Jason [name], male    DOB: [birth year], 60 y.o.   MRN: [id number]  Chief Complaint  Patient presents with  . Establish Care    CPE, fasting, flu and tetanus shot, needs refill of BP meds    HPI:  Jason [name] is a 60 y.o. male who presents today for an annual wellness visit.   1) Health Maintenance -   Diet - Averages about 2-3 meals per day consisting of fruits, vegetables, chicken, beef, pork and fish; 2-3 cups of caffeine daily  Exercise - Walking 1x per week for about 30-60 min.    2) Preventative Exams / Immunizations:  Dental -- Due for exam  Vision -- Due for exam   Health Maintenance  Topic Date Due  . Hepatitis C Screening  1956/03/28  . PNEUMOCOCCAL POLYSACCHARIDE VACCINE (1) 04/20/1956  . FOOT EXAM  04/20/1964  . OPHTHALMOLOGY EXAM  04/20/1964  . URINE MICROALBUMIN  04/20/1964  . HIV Screening  04/20/1969  . COLONOSCOPY  04/20/2004  . TETANUS/TDAP  01/31/2011  . HEMOGLOBIN A1C  03/13/2013  . ZOSTAVAX  04/20/2014  . INFLUENZA VACCINE  07/13/2015    Immunization History  Administered Date(s) Administered  . Influenza Split 09/12/2012  . Influenza Whole 12/24/2007  . Influenza,inj,Quad PF,36+ Mos 02/02/2016  . Td 01/31/2001  . Tdap 02/02/2016    Allergies  Allergen Reactions  . Lisinopril     hyperkalemia     Outpatient Prescriptions Prior to Visit  Medication Sig Dispense Refill  . diltiazem (CARDIZEM CD) 180 MG 24 hr capsule TAKE 1 CAPSULE (180 MG TOTAL) BY MOUTH DAILY. 30 capsule 9   No facility-administered medications prior to visit.     Past Medical History  Diagnosis Date  . HTN (hypertension)   . Hyperglycemia   . Bell palsy      Past Surgical History  Procedure Laterality Date  . Bellpalsy       Family History  Problem Relation Age of Onset  . Stroke Mother   . Colon cancer Brother   . Colon cancer Other     1st degree relative <60  . Diabetes Other     1st degree relative    . Stroke Other   . Colon cancer Sister      Social History   Social History  . Marital Status: Married    Spouse Name: N/A  . Number of Children: 2  . Years of Education: 12   Occupational History  . [phone]   . Truck Geophysicist/field seismologist    Social History Main Topics  . Smoking status: Never Smoker   . Smokeless tobacco: Never Used  . Alcohol Use: Yes     Comment: on weekends  . Drug Use: No  . Sexual Activity: Not on file   Other Topics Concern  . Not on file   Social History Narrative   Fun: Fix cars, build drag racers    Review of Systems  Constitutional: Denies fever, chills, fatigue, or significant weight gain/loss. HENT: Head: Denies headache or neck pain Ears: Denies changes in hearing, ringing in ears, earache, drainage Nose: Denies discharge, stuffiness, itching, nosebleed, sinus pain Throat: Denies sore throat, hoarseness, dry mouth, sores, thrush Eyes: Denies loss/changes in vision, pain, redness, blurry/double vision, flashing lights Cardiovascular: Denies chest pain/discomfort, tightness, palpitations, shortness of breath with activity, difficulty lying down, swelling, sudden awakening with shortness of breath Respiratory: Denies shortness of breath, cough, sputum production, wheezing Gastrointestinal: Denies dysphasia,  heartburn, change in appetite, nausea, change in bowel habits, rectal bleeding, constipation, diarrhea, yellow skin or eyes Genitourinary: Denies frequency, urgency, burning/pain, blood in urine, incontinence, change in urinary strength. Musculoskeletal: Denies muscle/joint pain, stiffness, back pain, redness or swelling of joints, trauma Skin: Denies rashes, lumps, itching, dryness, color changes, or hair/nail changes Neurological: Denies dizziness, fainting, seizures, weakness, numbness, tingling, tremor Psychiatric - Denies nervousness, stress, depression or memory loss Endocrine: Denies heat or cold intolerance, sweating, frequent  urination, excessive thirst, changes in appetite Hematologic: Denies ease of bruising or bleeding     Objective:    BP 142/94 mmHg  Pulse 69  Temp(Src) 98.3 F (36.8 C) (Oral)  Resp 18  Ht 6' 2.5" (1.892 m)  Wt 270 lb (122.471 kg)  BMI 34.21 kg/m2  SpO2 98% Nursing note and vital signs reviewed.  Physical Exam  Constitutional: He is oriented to person, place, and time. He appears well-developed and well-nourished.  HENT:  Head: Normocephalic.  Right Ear: Hearing, tympanic membrane, external ear and ear canal normal.  Left Ear: Hearing, tympanic membrane, external ear and ear canal normal.  Nose: Nose normal.  Mouth/Throat: Uvula is midline, oropharynx is clear and moist and mucous membranes are normal.  Eyes: Conjunctivae and EOM are normal. Pupils are equal, round, and reactive to light.  Neck: Neck supple. No JVD present. No tracheal deviation present. No thyromegaly present.  Cardiovascular: Normal rate, regular rhythm, normal heart sounds and intact distal pulses.   Pulmonary/Chest: Effort normal and breath sounds normal.  Abdominal: Soft. Bowel sounds are normal. He exhibits no distension and no mass. There is no tenderness. There is no rebound and no guarding.  Musculoskeletal: Normal range of motion. He exhibits no edema or tenderness.  Lymphadenopathy:    He has no cervical adenopathy.  Neurological: He is alert and oriented to person, place, and time. He has normal reflexes. No cranial nerve deficit. He exhibits normal muscle tone. Coordination normal.  Skin: Skin is warm and dry.  Psychiatric: He has a normal mood and affect. His behavior is normal. Judgment and thought content normal.       Assessment & Plan:   Problem List Items Addressed This Visit      Cardiovascular and Mediastinum   Essential hypertension   Relevant Medications   diltiazem (CARDIZEM CD) 180 MG 24 hr capsule     Endocrine   Type 2 diabetes mellitus ([place])   Relevant Orders    Microalbumin / creatinine urine ratio   Hemoglobin A1c     Other   Routine general medical examination at a health care facility - Primary    1) Anticipatory Guidance: Discussed importance of wearing a seatbelt while driving and not texting while driving; changing batteries in smoke detector at least once annually; wearing suntan lotion when outside; eating a balanced and moderate diet; getting physical activity at least 30 minutes per day.  2) Immunizations / Screenings / Labs:  Flu and tetanus updated today. Will schedule Zostavax and Pneumovax. All other immunizations are up to date per recommendations. Due for a colonoscopy with referral placed. Due for dental and vision screen to include a diabetic eye exam encouraged to be completed independently. Obtain A1c and microalbumen for diabetes screen. Obtain Hep C antibody for Hepatitis C screen. All other screenings are up to date per recommendations.  Obtain CBC,CMET, Lipid profile and TSH.   Overall well exam with risk factors for cardiovascular disease including hypertension and diabetes. Refill medication for hypertension. Continue to monitor  blood pressure at home. Obtain A1c to check current diabetes status. Encouraged to increase physical activity to 30 minutes of moderate level activity daily. Consume a nutritional intake that is moderate, varied, and balanced and focuses on nutrient dense foods and is low in saturated fats and processed/sugary foods. Recommended weight loss of approximately 5-10% of current body weight through lifestyle changes. Continue other healthy lifestyle behaviors and choices. Follow-up prevention exam in 1 year. Follow-up office visit for chronic conditions pending blood work.       Relevant Orders   Hepatitis C antibody   CBC   Comprehensive metabolic panel   Lipid panel   PSA   TSH   Hemoglobin A1c    Other Visit Diagnoses    Need for diphtheria-tetanus-pertussis (Tdap) vaccine, adult/adolescent         Relevant Orders    Tdap vaccine greater than or equal to 7yo IM (Completed)    High risk for colon cancer        Relevant Orders    Ambulatory referral to Gastroenterology

## 2016-02-02 NOTE — Progress Notes (Signed)
Pre visit review using our clinic review tool, if applicable. No additional management support is needed unless otherwise documented below in the visit note. 

## 2016-02-03 LAB — HEPATITIS C ANTIBODY: HCV Ab: NEGATIVE

## 2016-02-07 ENCOUNTER — Telehealth: Payer: Self-pay | Admitting: Family

## 2016-02-07 NOTE — Telephone Encounter (Signed)
Please inform patient hepatitis C was negative. Her other blood work shows that your prostate function, kidney function, liver function, thyroid, cholesterol and white/red blood cells are all within the normal ranges. His potassium was slightly elevated which we will need to continue to monitor. Please plan to follow-up in about one month for recheck. Lastly, his diabetes has increased from 6.7-10.6 indicating significantly worsening of his diabetes blood sugar control. Therefore I'm recommending who place him on medication to lower his blood sugars. We will start with metformin and most likely need additional agents in the near future. I would like him to check his blood sugar at least 1 time per day. If he needs a meter and strips, please determine which meters are covered and we will send in a prescription. Please have him schedule an appointment for 2 weeks to discuss his diabetes management. Also please check his formulary for medications that are covered so that we can be is cost effective as possible.

## 2016-02-09 MED ORDER — METFORMIN HCL 500 MG PO TABS: 500.0000 mg | ORAL_TABLET | Freq: Two times a day (BID) | ORAL | Status: DC

## 2016-02-09 NOTE — Telephone Encounter (Signed)
Pt is aware of results. Metformin sent in for him to get started on.

## 2016-02-18 ENCOUNTER — Encounter: Payer: Self-pay | Admitting: Gastroenterology

## 2016-02-25 ENCOUNTER — Encounter: Payer: Self-pay | Admitting: Family

## 2016-02-25 ENCOUNTER — Ambulatory Visit (INDEPENDENT_AMBULATORY_CARE_PROVIDER_SITE_OTHER): Payer: 59 | Admitting: Family

## 2016-02-25 VITALS — BP 108/82 | HR 63 | Temp 98.1°F | Resp 16 | Ht 74.5 in | Wt 277.0 lb

## 2016-02-25 DIAGNOSIS — Z23 Encounter for immunization: Secondary | ICD-10-CM

## 2016-02-25 DIAGNOSIS — E119 Type 2 diabetes mellitus without complications: Secondary | ICD-10-CM

## 2016-02-25 MED ORDER — ONETOUCH ULTRASOFT LANCETS MISC: Status: DC

## 2016-02-25 MED ORDER — GLUCOSE BLOOD VI STRP: ORAL_STRIP | Status: DC

## 2016-02-25 MED ORDER — ONETOUCH ULTRA 2 W/DEVICE KIT: PACK | Status: DC

## 2016-02-25 NOTE — Progress Notes (Signed)
Subjective:    Patient ID: Jason [name], male    DOB: [birth year], 60 y.o.   MRN: [id number]  Chief Complaint  Patient presents with  . Follow-up    diabetes    HPI:  Jason [name] is a 60 y.o. male who  has a past medical history of HTN (hypertension); Hyperglycemia; and Bell palsy. and presents today For an office follow-up.  Type 2 diabetes currently managed with metformin. His most recent A1c worsened from 6.7-10.6. Reports taking the medication as prescribed and denies adverse side effects. No hypoglycemic readings. No symptoms of end organ damage. Blood sugars at home at home are ranging between 140-160.    Lab Results  Component Value Date   HGBA1C 10.6* 02/02/2016    Allergies  Allergen Reactions  . Lisinopril     hyperkalemia     Current Outpatient Prescriptions on File Prior to Visit  Medication Sig Dispense Refill  . diltiazem (CARDIZEM CD) 180 MG 24 hr capsule Take 1 capsule (180 mg total) by mouth daily. 90 capsule 0  . metFORMIN (GLUCOPHAGE) 500 MG tablet Take 1 tablet (500 mg total) by mouth 2 (two) times daily with a meal. 60 tablet 1   No current facility-administered medications on file prior to visit.     Past Surgical History  Procedure Laterality Date  . Bellpalsy       Review of Systems  Constitutional: Negative for diaphoresis, appetite change and unexpected weight change.  Eyes:       Negative for changes in vision.  Respiratory: Negative for chest tightness and shortness of breath.   Cardiovascular: Negative for chest pain, palpitations and leg swelling.  Endocrine: Negative for polydipsia, polyphagia and polyuria.  Neurological: Negative for dizziness, weakness, numbness and headaches.  Psychiatric/Behavioral: Negative for sleep disturbance and decreased concentration. The patient is not nervous/anxious.       Objective:    BP 108/82 mmHg  Pulse 63  Temp(Src) 98.1 F (36.7 C) (Oral)  Resp 16  Ht 6' 2.5" (1.892 m)  Wt  277 lb (125.646 kg)  BMI 35.10 kg/m2  SpO2 96% Nursing note and vital signs reviewed.  Physical Exam  Constitutional: He is oriented to person, place, and time. He appears well-developed and well-nourished. No distress.  Cardiovascular: Normal rate, regular rhythm, normal heart sounds and intact distal pulses.   Pulmonary/Chest: Effort normal and breath sounds normal.  Neurological: He is alert and oriented to person, place, and time.  Diabetic Foot Exam - Simple   Simple Foot Form  Diabetic Foot exam was performed with the following findings:  Yes  02/25/2016 10:03 AM  Visual Inspection  No deformities, no ulcerations, no other skin breakdown bilaterally:  Yes  Sensation Testing  Intact to touch and monofilament testing bilaterally:  Yes  Pulse Check  Posterior Tibialis and Dorsalis pulse intact bilaterally:  Yes   Skin: Skin is warm and dry.  Psychiatric: He has a normal mood and affect. His behavior is normal. Judgment and thought content normal.       Assessment & Plan:   Problem List Items Addressed This Visit      Endocrine   Type 2 diabetes mellitus ([place]) - Primary    Type 2 diabetes appears with improved control since starting metformin. No adverse side effects or symptoms of end organ damage. Continue current dosage of metformin. Blood glucose meter and test strips sent to pharmacy. Diabetic foot exam completed with no irregularities. Encouraged to complete diabetic eye exam  at his convenience. Not currently maintained on Ace or statin medication at this time. Pneumovax updated today. Follow-up in 2 months for A1c.      Relevant Medications   glucose blood (ONE TOUCH ULTRA TEST) test strip   Blood Glucose Monitoring Suppl (ONE TOUCH ULTRA 2) w/Device KIT   Lancets (ONETOUCH ULTRASOFT) lancets

## 2016-02-25 NOTE — Progress Notes (Signed)
Pre visit review using our clinic review tool, if applicable. No additional management support is needed unless otherwise documented below in the visit note. 

## 2016-02-25 NOTE — Patient Instructions (Addendum)
Thank you for choosing [facility].  Summary/Instructions:  Please continue to take your medications as prescribed.  Please check and record your blood sugar daily.   Please schedule a diabetic eye exam at your convenience.   Your prescription(s) have been submitted to your pharmacy or been printed and provided for you. Please take as directed and contact our office if you believe you are having problem(s) with the medication(s) or have any questions.

## 2016-02-25 NOTE — Assessment & Plan Note (Signed)
Type 2 diabetes appears with improved control since starting metformin. No adverse side effects or symptoms of end organ damage. Continue current dosage of metformin. Blood glucose meter and test strips sent to pharmacy. Diabetic foot exam completed with no irregularities. Encouraged to complete diabetic eye exam at his convenience. Not currently maintained on Ace or statin medication at this time. Pneumovax updated today. Follow-up in 2 months for A1c.

## 2016-03-14 ENCOUNTER — Ambulatory Visit (AMBULATORY_SURGERY_CENTER): Payer: Self-pay

## 2016-03-14 VITALS — Ht 75.0 in | Wt 268.0 lb

## 2016-03-14 DIAGNOSIS — Z860100 Personal history of colon polyps, unspecified: Secondary | ICD-10-CM

## 2016-03-14 NOTE — Progress Notes (Signed)
No allergies to eggs or soy No past problems with anesthesia No home oxygen No diet/weight loss meds  Refused emmi 

## 2016-03-24 ENCOUNTER — Encounter: Payer: Self-pay | Admitting: Gastroenterology

## 2016-03-28 ENCOUNTER — Encounter: Payer: Self-pay | Admitting: Gastroenterology

## 2016-03-28 ENCOUNTER — Ambulatory Visit (AMBULATORY_SURGERY_CENTER): Payer: 59 | Admitting: Gastroenterology

## 2016-03-28 VITALS — BP 109/77 | HR 60 | Temp 97.2°F | Resp 21 | Ht 75.0 in | Wt 268.0 lb

## 2016-03-28 DIAGNOSIS — D122 Benign neoplasm of ascending colon: Secondary | ICD-10-CM

## 2016-03-28 DIAGNOSIS — D123 Benign neoplasm of transverse colon: Secondary | ICD-10-CM

## 2016-03-28 DIAGNOSIS — Z86010 Personal history of colonic polyps: Secondary | ICD-10-CM

## 2016-03-28 DIAGNOSIS — D125 Benign neoplasm of sigmoid colon: Secondary | ICD-10-CM

## 2016-03-28 DIAGNOSIS — K635 Polyp of colon: Secondary | ICD-10-CM

## 2016-03-28 DIAGNOSIS — D128 Benign neoplasm of rectum: Secondary | ICD-10-CM

## 2016-03-28 LAB — GLUCOSE, CAPILLARY
Glucose-Capillary: 115 mg/dL — ABNORMAL HIGH (ref 65–99)
Glucose-Capillary: 109 mg/dL — ABNORMAL HIGH (ref 65–99)

## 2016-03-28 MED ORDER — SODIUM CHLORIDE 0.9 % IV SOLN: 500.0000 mL | INTRAVENOUS | Status: DC

## 2016-03-28 NOTE — Progress Notes (Signed)
Report given to PACU RN, vss 

## 2016-03-28 NOTE — Op Note (Signed)
[facility] [place] Patient Name: Jason [name] Procedure Date: 03/28/2016 10:04 AM MRN: [id number] Endoscopist: Mallie Mussel [name] , MD Age: 60 Date of Birth: [birth year] Gender: Male Procedure:                Colonoscopy Indications:              Screening patient at increased risk: Family history                            of colorectal cancer in multiple 1st-degree                            relatives ( two siblings, both > 56 at Dx) Medicines:                Monitored Anesthesia Care Procedure:                Pre-Anesthesia Assessment:                           - Prior to the procedure, a History and Physical                            was performed, and patient medications and                            allergies were reviewed. The patient's tolerance of                            previous anesthesia was also reviewed. The risks                            and benefits of the procedure and the sedation                            options and risks were discussed with the patient.                            All questions were answered, and informed consent                            was obtained. Prior Anticoagulants: The patient has                            taken no previous anticoagulant or antiplatelet                            agents. ASA Grade Assessment: II - A patient with                            mild systemic disease. After reviewing the risks                            and benefits, the patient was deemed in  satisfactory condition to undergo the procedure.                           After obtaining informed consent, the colonoscope                            was passed under direct vision. Throughout the                            procedure, the patient's blood pressure, pulse, and                            oxygen saturations were monitored continuously. The                            Model CF-HQ190L [id number]) scope was introduced                       through the anus and advanced to the the cecum,                            identified by appendiceal orifice and ileocecal                            valve. The colonoscopy was performed without                            difficulty. The patient tolerated the procedure                            well. The quality of the bowel preparation was                            good. The ileocecal valve, appendiceal orifice, and                            rectum were photographed. The quality of the bowel                            preparation was evaluated using the BBPS [facility]                            Bowel Preparation Scale) with scores of: Right                            Colon = 2, Transverse Colon = 2 and Left Colon = 2.                            The total BBPS score equals 6. The bowel                            preparation used was Miralax. Scope In: 10:14:51 AM Scope Out: 10:43:24 AM Scope Withdrawal Time: 0 hours 26 minutes 56 seconds  Total Procedure Duration:  0 hours 28 minutes 33 seconds  Findings:                 The perianal and digital rectal examinations were                            normal.                           Four sessile polyps were found in the ascending                            colon. The polyps were 4 to 10 mm in size. These                            polyps were removed with a cold snare. Resection                            and retrieval were complete.                           Two sessile polyps were found in the proximal                            transverse colon. The polyps were 4 to 6 mm in                            size. These polyps were removed with a hot snare.                            Resection and retrieval were complete.                           Two sessile polyps were found in the distal sigmoid                            colon. The polyps were 4 to 6 mm in size. These                            polyps were removed with a  cold snare. Resection                            and retrieval were complete.                           A 4 mm polyp was found in the rectum. The polyp was                            sessile. The polyp was removed with a hot snare.                            Resection and retrieval were complete.  Multiple small-mouthed diverticula were found in                            the sigmoid colon.                           Internal hemorrhoids were found during                            retroflexion. The hemorrhoids were Grade I                            (internal hemorrhoids that do not prolapse).                           The exam was otherwise without abnormality. Complications:            No immediate complications. Estimated Blood Loss:     Estimated blood loss was minimal. Impression:               - Four 4 to 10 mm polyps in the ascending colon,                            removed with a cold snare. Resected and retrieved.                           - Two 4 to 6 mm polyps in the proximal transverse                            colon, removed with a hot snare. Resected and                            retrieved.                           - Two 4 to 6 mm polyps in the distal sigmoid colon,                            removed with a cold snare. Resected and retrieved.                           - One 4 mm polyp in the rectum, removed with a hot                            snare. Resected and retrieved.                           - Diverticulosis in the sigmoid colon.                           - Internal hemorrhoids.                           - The examination was otherwise normal. Recommendation:           -  Patient has a contact number available for                            emergencies. The signs and symptoms of potential                            delayed complications were discussed with the                            patient. Return to normal activities tomorrow.                             Written discharge instructions were provided to the                            patient.                           - Resume previous diet.                           - Continue present medications.                           - No aspirin, ibuprofen, naproxen, or other                            non-steroidal anti-inflammatory drugs for 5 days                            after polyp removal.                           - Await pathology results.                           - Repeat colonoscopy is recommended for                            surveillance. The colonoscopy date will be                            determined after pathology results from today's                            exam become available for review. Areyanna Figeroa [name], MD 03/28/2016 10:52:42 AM This report has been signed electronically.

## 2016-03-28 NOTE — Patient Instructions (Signed)
Discharge instructions given. Handouts on polyps,diverticulosis and hemorrhoids. Hold aspirin and any anti-inflammatory  For 5 days. Resume previous medications. YOU HAD AN ENDOSCOPIC PROCEDURE TODAY AT [facility]:   Refer to the procedure report that was given to you for any specific questions about what was found during the examination.  If the procedure report does not answer your questions, please call your gastroenterologist to clarify.  If you requested that your care partner not be given the details of your procedure findings, then the procedure report has been included in a sealed envelope for you to review at your convenience later.  YOU SHOULD EXPECT: Some feelings of bloating in the abdomen. Passage of more gas than usual.  Walking can help get rid of the air that was put into your GI tract during the procedure and reduce the bloating. If you had a lower endoscopy (such as a colonoscopy or flexible sigmoidoscopy) you may notice spotting of blood in your stool or on the toilet paper. If you underwent a bowel prep for your procedure, you may not have a normal bowel movement for a few days.  Please Note:  You might notice some irritation and congestion in your nose or some drainage.  This is from the oxygen used during your procedure.  There is no need for concern and it should clear up in a day or so.  SYMPTOMS TO REPORT IMMEDIATELY:   Following lower endoscopy (colonoscopy or flexible sigmoidoscopy):  Excessive amounts of blood in the stool  Significant tenderness or worsening of abdominal pains  Swelling of the abdomen that is new, acute  Fever of 100F or higher   For urgent or emergent issues, a gastroenterologist can be reached at any hour by calling [phone].   DIET: Your first meal following the procedure should be a small meal and then it is ok to progress to your normal diet. Heavy or fried foods are harder to digest and may make you feel nauseous or  bloated.  Likewise, meals heavy in dairy and vegetables can increase bloating.  Drink plenty of fluids but you should avoid alcoholic beverages for 24 hours.  ACTIVITY:  You should plan to take it easy for the rest of today and you should NOT DRIVE or use heavy machinery until tomorrow (because of the sedation medicines used during the test).    FOLLOW UP: Our staff will call the number listed on your records the next business day following your procedure to check on you and address any questions or concerns that you may have regarding the information given to you following your procedure. If we do not reach you, we will leave a message.  However, if you are feeling well and you are not experiencing any problems, there is no need to return our call.  We will assume that you have returned to your regular daily activities without incident.  If any biopsies were taken you will be contacted by phone or by letter within the next 1-3 weeks.  Please call us at [phone] if you have not heard about the biopsies in 3 weeks.    SIGNATURES/CONFIDENTIALITY: You and/or your care partner have signed paperwork which will be entered into your electronic medical record.  These signatures attest to the fact that that the information above on your After Visit Summary has been reviewed and is understood.  Full responsibility of the confidentiality of this discharge information lies with you and/or your care-partner.

## 2016-03-28 NOTE — Progress Notes (Signed)
Called to room to assist during endoscopic procedure.  Patient ID and intended procedure confirmed with present staff. Received instructions for my participation in the procedure from the performing physician.  

## 2016-03-29 ENCOUNTER — Telehealth: Payer: Self-pay | Admitting: *Deleted

## 2016-03-29 NOTE — Telephone Encounter (Signed)
No answer, left message to call if questions or concerns. 

## 2016-03-31 ENCOUNTER — Encounter: Payer: Self-pay | Admitting: Gastroenterology

## 2016-04-26 ENCOUNTER — Ambulatory Visit (INDEPENDENT_AMBULATORY_CARE_PROVIDER_SITE_OTHER): Payer: 59 | Admitting: Family

## 2016-04-26 ENCOUNTER — Encounter: Payer: Self-pay | Admitting: Family

## 2016-04-26 ENCOUNTER — Other Ambulatory Visit (INDEPENDENT_AMBULATORY_CARE_PROVIDER_SITE_OTHER): Payer: 59

## 2016-04-26 ENCOUNTER — Telehealth: Payer: Self-pay | Admitting: Family

## 2016-04-26 VITALS — BP 130/80 | HR 69 | Temp 98.2°F | Resp 16 | Ht 74.5 in | Wt 267.8 lb

## 2016-04-26 DIAGNOSIS — E119 Type 2 diabetes mellitus without complications: Secondary | ICD-10-CM

## 2016-04-26 LAB — HEMOGLOBIN A1C: Hgb A1c MFr Bld: 9.5 % — ABNORMAL HIGH (ref 4.6–6.5)

## 2016-04-26 MED ORDER — METFORMIN HCL 1000 MG PO TABS: 1000.0000 mg | ORAL_TABLET | Freq: Two times a day (BID) | ORAL | Status: DC

## 2016-04-26 MED ORDER — EMPAGLIFLOZIN 10 MG PO TABS: 10.0000 mg | ORAL_TABLET | Freq: Every day | ORAL | Status: DC

## 2016-04-26 NOTE — Assessment & Plan Note (Signed)
Diabetes appears to be with improved control based on home readings. No adverse side effects. Obtain A1c. Continue current dosage of metformin pending A1c results. Diabetic eye exam is scheduled in 2 weeks. Diabetic foot exam and urine microalbumin is up to date. Continue to work on lifestyle management to help with blood sugars. Continue to check blood sugars at home. Follow up pending A1c results.

## 2016-04-26 NOTE — Telephone Encounter (Signed)
Please inform patient that his A1c remains elevated at 9.5 which is an improvement from the 10.6 however remains significantly out of control. I have increased his metformin to 1000 mg twice daily. I have also added a medication called Jardiance for him to take. Coupons for this medication are available through the drugs web-site [email or web address].com

## 2016-04-26 NOTE — Patient Instructions (Addendum)
Thank you for choosing [facility].  Summary/Instructions:  Please continue to take your medications as prescribed.   If your symptoms worsen or fail to improve, please contact our office for further instruction, or in case of emergency go directly to the emergency room at the closest medical facility.     

## 2016-04-26 NOTE — Progress Notes (Signed)
Pre visit review using our clinic review tool, if applicable. No additional management support is needed unless otherwise documented below in the visit note. 

## 2016-04-26 NOTE — Progress Notes (Signed)
Subjective:    Patient ID: Jason [name], male    DOB: [birth year], 60 y.o.   MRN: [id number]  Chief Complaint  Patient presents with  . Follow-up    diabetes    HPI:  Jason [name] is a 60 y.o. male who  has a past medical history of HTN (hypertension); Hyperglycemia; Bell palsy; and Diabetes mellitus without complication ([place]). and presents today for a follow up office visit.   1.) Type 2 diabetes - Currently maintained on metformin. Reports taking the medication as prescribed and denies adverse side effects or hypoglycemic readings. Blood sugars at home have been around 110-115. Denies symptoms of end organ damage. Has started to exercise some. Has cut back on the soda and sugary foods and working to improve his vegetable intake. Has appointment with opthalmology in about 2 weeks.   Lab Results  Component Value Date   HGBA1C 10.6* 02/02/2016    Allergies  Allergen Reactions  . Lisinopril     hyperkalemia     Current Outpatient Prescriptions on File Prior to Visit  Medication Sig Dispense Refill  . Blood Glucose Monitoring Suppl (ONE TOUCH ULTRA 2) w/Device KIT Use the device daily to check your blood sugars 1-4 times as instructed. 1 each 0  . diltiazem (CARDIZEM CD) 180 MG 24 hr capsule Take 1 capsule (180 mg total) by mouth daily. 90 capsule 0  . glucose blood (ONE TOUCH ULTRA TEST) test strip Use as instructed 100 each 12  . Lancets (ONETOUCH ULTRASOFT) lancets Use as instructed 100 each 12  . metFORMIN (GLUCOPHAGE) 500 MG tablet Take 1 tablet (500 mg total) by mouth 2 (two) times daily with a meal. 60 tablet 1   No current facility-administered medications on file prior to visit.     Review of Systems  Constitutional: Negative for fever and chills.  Eyes:       Negative for changes in vision.  Respiratory: Negative for chest tightness and shortness of breath.   Cardiovascular: Negative for chest pain, palpitations and leg swelling.  Endocrine: Negative  for polydipsia, polyphagia and polyuria.  Neurological: Negative for dizziness, weakness and headaches.      Objective:    BP 130/80 mmHg  Pulse 69  Temp(Src) 98.2 F (36.8 C) (Oral)  Resp 16  Ht 6' 2.5" (1.892 m)  Wt 267 lb 12.8 oz (121.473 kg)  BMI 33.93 kg/m2  SpO2 97% Nursing note and vital signs reviewed.  Physical Exam  Constitutional: He is oriented to person, place, and time. He appears well-developed and well-nourished. No distress.  Cardiovascular: Normal rate, regular rhythm, normal heart sounds and intact distal pulses.   Pulmonary/Chest: Effort normal and breath sounds normal.  Neurological: He is alert and oriented to person, place, and time.  Skin: Skin is warm and dry.  Psychiatric: He has a normal mood and affect. His behavior is normal. Judgment and thought content normal.       Assessment & Plan:   Problem List Items Addressed This Visit      Endocrine   Type 2 diabetes mellitus ([place]) - Primary    Diabetes appears to be with improved control based on home readings. No adverse side effects. Obtain A1c. Continue current dosage of metformin pending A1c results. Diabetic eye exam is scheduled in 2 weeks. Diabetic foot exam and urine microalbumin is up to date. Continue to work on lifestyle management to help with blood sugars. Continue to check blood sugars at home. Follow up pending A1c  results.       Relevant Orders   Hemoglobin A1c       I am having Mr. Yokum maintain his diltiazem, metFORMIN, glucose blood, ONE TOUCH ULTRA 2, and onetouch ultrasoft.    Follow-up: Return in about 3 months (around 07/27/2016).  Mauricio Po, FNP

## 2016-04-27 NOTE — Telephone Encounter (Signed)
Pt aware of results. Sending them in the mail per pts request.

## 2016-04-29 ENCOUNTER — Other Ambulatory Visit: Payer: Self-pay | Admitting: Family

## 2016-05-07 ENCOUNTER — Other Ambulatory Visit: Payer: Self-pay | Admitting: Family

## 2016-11-04 ENCOUNTER — Other Ambulatory Visit: Payer: Self-pay | Admitting: Family

## 2017-05-31 ENCOUNTER — Other Ambulatory Visit: Payer: Self-pay | Admitting: Family

## 2017-11-07 ENCOUNTER — Other Ambulatory Visit: Payer: Self-pay | Admitting: *Deleted

## 2017-11-07 NOTE — Telephone Encounter (Signed)
Rec';d fax pt requesting refill on his Metformin. Rx denied.. patient need OV. Last appt was 04/2016...Jason [name]

## 2018-03-06 ENCOUNTER — Other Ambulatory Visit: Payer: Self-pay | Admitting: Internal Medicine

## 2018-10-10 ENCOUNTER — Encounter: Payer: Self-pay | Admitting: Adult Health

## 2018-10-10 ENCOUNTER — Other Ambulatory Visit: Payer: Self-pay | Admitting: Adult Health

## 2018-10-10 ENCOUNTER — Ambulatory Visit (INDEPENDENT_AMBULATORY_CARE_PROVIDER_SITE_OTHER): Payer: BLUE CROSS/BLUE SHIELD | Admitting: Adult Health

## 2018-10-10 VITALS — BP 148/90 | Temp 98.2°F | Wt 247.0 lb

## 2018-10-10 DIAGNOSIS — Z125 Encounter for screening for malignant neoplasm of prostate: Secondary | ICD-10-CM | POA: Diagnosis not present

## 2018-10-10 DIAGNOSIS — Z23 Encounter for immunization: Secondary | ICD-10-CM

## 2018-10-10 DIAGNOSIS — E119 Type 2 diabetes mellitus without complications: Secondary | ICD-10-CM

## 2018-10-10 DIAGNOSIS — Z114 Encounter for screening for human immunodeficiency virus [HIV]: Secondary | ICD-10-CM | POA: Diagnosis not present

## 2018-10-10 DIAGNOSIS — Z0000 Encounter for general adult medical examination without abnormal findings: Secondary | ICD-10-CM | POA: Diagnosis not present

## 2018-10-10 DIAGNOSIS — I10 Essential (primary) hypertension: Secondary | ICD-10-CM | POA: Diagnosis not present

## 2018-10-10 DIAGNOSIS — E1169 Type 2 diabetes mellitus with other specified complication: Secondary | ICD-10-CM | POA: Diagnosis not present

## 2018-10-10 LAB — HEPATIC FUNCTION PANEL
ALT: 33 U/L (ref 0–53)
AST: 22 U/L (ref 0–37)
Albumin: 4.1 g/dL (ref 3.5–5.2)
Alkaline Phosphatase: 74 U/L (ref 39–117)
Bilirubin, Direct: 0.1 mg/dL (ref 0.0–0.3)
Total Bilirubin: 0.4 mg/dL (ref 0.2–1.2)
Total Protein: 7.4 g/dL (ref 6.0–8.3)

## 2018-10-10 LAB — CBC WITH DIFFERENTIAL/PLATELET
Basophils Absolute: 0 10*3/uL (ref 0.0–0.1)
Basophils Relative: 0.6 % (ref 0.0–3.0)
Eosinophils Absolute: 0.3 10*3/uL (ref 0.0–0.7)
Eosinophils Relative: 5.7 % — ABNORMAL HIGH (ref 0.0–5.0)
HCT: 46.7 % (ref 39.0–52.0)
Hemoglobin: 15.8 g/dL (ref 13.0–17.0)
Lymphocytes Relative: 44.1 % (ref 12.0–46.0)
Lymphs Abs: 2.2 10*3/uL (ref 0.7–4.0)
MCHC: 34 g/dL (ref 30.0–36.0)
MCV: 95.4 fl (ref 78.0–100.0)
Monocytes Absolute: 0.3 10*3/uL (ref 0.1–1.0)
Monocytes Relative: 6.6 % (ref 3.0–12.0)
Neutro Abs: 2.2 10*3/uL (ref 1.4–7.7)
Neutrophils Relative %: 43 % (ref 43.0–77.0)
Platelets: 150 10*3/uL (ref 150.0–400.0)
RBC: 4.89 Mil/uL (ref 4.22–5.81)
RDW: 13.4 % (ref 11.5–15.5)
WBC: 5 10*3/uL (ref 4.0–10.5)

## 2018-10-10 LAB — PSA: PSA: 0.7 ng/mL (ref 0.10–4.00)

## 2018-10-10 LAB — LIPID PANEL
Cholesterol: 189 mg/dL (ref 0–200)
HDL: 57.7 mg/dL (ref >39.00)
LDL Cholesterol: 99 mg/dL (ref 0–99)
NonHDL: 131.14
Total CHOL/HDL Ratio: 3
Triglycerides: 162 mg/dL — ABNORMAL HIGH (ref 0.0–149.0)
VLDL: 32.4 mg/dL (ref 0.0–40.0)

## 2018-10-10 LAB — BASIC METABOLIC PANEL
BUN: 12 mg/dL (ref 6–23)
CO2: 29 mEq/L (ref 19–32)
Calcium: 9.6 mg/dL (ref 8.4–10.5)
Chloride: 100 mEq/L (ref 96–112)
Creatinine, Ser: 1.25 mg/dL (ref 0.40–1.50)
GFR: 74.67 mL/min (ref >60.00)
Glucose, Bld: 363 mg/dL — ABNORMAL HIGH (ref 70–99)
Potassium: 4.2 mEq/L (ref 3.5–5.1)
Sodium: 137 mEq/L (ref 135–145)

## 2018-10-10 LAB — MICROALBUMIN / CREATININE URINE RATIO
Creatinine,U: 83.1 mg/dL
Microalb Creat Ratio: 3.8 mg/g (ref 0.0–30.0)
Microalb, Ur: 3.1 mg/dL — ABNORMAL HIGH (ref 0.0–1.9)

## 2018-10-10 LAB — HEMOGLOBIN A1C: Hgb A1c MFr Bld: 14 % — ABNORMAL HIGH (ref 4.6–6.5)

## 2018-10-10 LAB — TSH: TSH: 2.73 u[IU]/mL (ref 0.35–4.50)

## 2018-10-10 MED ORDER — METFORMIN HCL 1000 MG PO TABS: 1000.0000 mg | ORAL_TABLET | Freq: Two times a day (BID) | ORAL | 0 refills | Status: DC

## 2018-10-10 MED ORDER — ONETOUCH ULTRASOFT LANCETS MISC: 12 refills | Status: DC

## 2018-10-10 MED ORDER — EMPAGLIFLOZIN 10 MG PO TABS: 10.0000 mg | ORAL_TABLET | Freq: Every day | ORAL | 0 refills | Status: DC

## 2018-10-10 MED ORDER — DILTIAZEM HCL ER COATED BEADS 120 MG PO CP24: 120.0000 mg | ORAL_CAPSULE | Freq: Every day | ORAL | 0 refills | Status: DC

## 2018-10-10 MED ORDER — GLIPIZIDE ER 5 MG PO TB24: 5.0000 mg | ORAL_TABLET | Freq: Every day | ORAL | 0 refills | Status: DC

## 2018-10-10 MED ORDER — GLUCOSE BLOOD VI STRP: ORAL_STRIP | 12 refills | Status: DC

## 2018-10-10 NOTE — Progress Notes (Signed)
Patient presents to clinic today to establish care. He is a pleasant 62 year old male who  has a past medical history of Bell palsy, Diabetes mellitus without complication ([place]), HTN (hypertension), and Hyperglycemia.  He is a former patient of Terri Piedra, NP   He has not been seen since 2017   Acute Concerns: Establish Care/CPE   Chronic Issues: DM Type 2- was previously prescribed Metformin 1000 mg BID and Jardiance 10 mg. He has been out of this for some time now. He has not been checking his blood sugars at home.  Lab Results  Component Value Date   HGBA1C 9.5 (H) 04/26/2016   Essential Hypertension - Prescribed Cardizem 180 mg in the past. He has been out of the medication for a few months BP Readings from Last 3 Encounters:  10/10/18 132/86  04/26/16 130/80  03/28/16 109/77     Health Maintenance: Dental -- Routine Care Vision -- Routine Care Immunizations -- UTD  Colonoscopy -- UTD- every three years. Has two siblings with colon cancer.  Diet: He tries to eat healthy, eats veggies and not a lot of red meat Wt Readings from Last 3 Encounters:  10/10/18 247 lb (112 kg)  04/26/16 267 lb 12.8 oz (121.5 kg)  03/28/16 268 lb (121.6 kg)   Exercise: Does not exercise on a routine basis.   Past Medical History:  Diagnosis Date  . Bell palsy   . Diabetes mellitus without complication ([place])   . HTN (hypertension)   . Hyperglycemia     Past Surgical History:  Procedure Laterality Date  . bellpalsy    . COLONOSCOPY W/ POLYPECTOMY      Current Outpatient Medications on File Prior to Visit  Medication Sig Dispense Refill  . Blood Glucose Monitoring Suppl (ONE TOUCH ULTRA 2) w/Device KIT Use the device daily to check your blood sugars 1-4 times as instructed. 1 each 0  . diltiazem (CARDIZEM CD) 180 MG 24 hr capsule Take 1 capsule (180 mg total) by mouth daily. --patient needs office visit for further refills 90 capsule 0  . empagliflozin (JARDIANCE) 10 MG TABS  tablet Take 10 mg by mouth daily. 30 tablet 2  . glucose blood (ONE TOUCH ULTRA TEST) test strip Use as instructed 100 each 12  . Lancets (ONETOUCH ULTRASOFT) lancets Use as instructed 100 each 12  . metFORMIN (GLUCOPHAGE) 500 MG tablet TAKE 1 TABLET BY MOUTH TWICE A DAY WITH A MEAL 60 tablet 1   No current facility-administered medications on file prior to visit.     Allergies  Allergen Reactions  . Lisinopril     hyperkalemia    Family History  Problem Relation Age of Onset  . Stroke Mother   . Colon cancer Brother   . Colon cancer Other        1st degree relative <60  . Diabetes Other        1st degree relative   . Stroke Other   . Colon cancer Sister     Social History   Socioeconomic History  . Marital status: Married    Spouse name: Not on file  . Number of children: 2  . Years of education: 4  . Highest education level: Not on file  Occupational History  . Occupation: [phone]    Employer: [employer]  . 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 Financial resource strain: Not on file  . Food insecurity:    Worry: Not on file  Inability: Not on file  . Transportation needs:    Medical: Not on file    Non-medical: Not on file  Tobacco Use  . Smoking status: Never Smoker  . Smokeless tobacco: Never Used  Substance and Sexual Activity  . Alcohol use: Yes    Comment: on weekends  . Drug use: No  . Sexual activity: Not on file  Lifestyle  . Physical activity:    Days per week: Not on file    Minutes per session: Not on file  . Stress: Not on file  Relationships  . Social connections:    Talks on phone: Not on file    Gets together: Not on file    Attends religious service: Not on file    Active member of club or organization: Not on file    Attends meetings of clubs or organizations: Not on file    Relationship status: Not on file  . Intimate partner violence:    Fear of current or ex partner: Not on file    Emotionally abused:  Not on file    Physically abused: Not on file    Forced sexual activity: Not on file  Other Topics Concern  . Not on file  Social History Narrative   Fun: Fix cars, build drag racers    Review of Systems  Constitutional: Negative.   HENT: Negative.   Eyes: Negative.   Respiratory: Negative.   Gastrointestinal: Negative.   Genitourinary: Negative.   Musculoskeletal: Negative.   Skin: Negative.   Neurological: Negative.   Endo/Heme/Allergies: Negative.   Psychiatric/Behavioral: Negative.   All other systems reviewed and are negative.      BP 132/86   Temp 98.2 F (36.8 C)   Wt 247 lb (112 kg)   BMI 31.29 kg/m   Physical Exam  Constitutional: He is oriented to person, place, and time. He appears well-developed and well-nourished. No distress.  HENT:  Head: Normocephalic and atraumatic.  Right Ear: External ear normal.  Left Ear: External ear normal.  Nose: Nose normal.  Mouth/Throat: Oropharynx is clear and moist. No oropharyngeal exudate.  Eyes: Pupils are equal, round, and reactive to light. Conjunctivae and EOM are normal. Right eye exhibits no discharge. Left eye exhibits no discharge. No scleral icterus.  Neck: Normal range of motion. Neck supple. No JVD present. No tracheal deviation present. No thyromegaly present.  Cardiovascular: Normal rate, regular rhythm, normal heart sounds and intact distal pulses. Exam reveals no gallop and no friction rub.  No murmur heard. Pulmonary/Chest: Effort normal and breath sounds normal. No stridor. No respiratory distress. He has no wheezes. He has no rales. He exhibits no tenderness.  Abdominal: Soft. Bowel sounds are normal. He exhibits no distension and no mass. There is no tenderness. There is no rebound and no guarding. No hernia.  Genitourinary:  Genitourinary Comments: Will do PSA  Musculoskeletal: Normal range of motion. He exhibits no edema, tenderness or deformity.  Lymphadenopathy:    He has no cervical adenopathy.    Neurological: He is alert and oriented to person, place, and time. He displays normal reflexes. No cranial nerve deficit or sensory deficit. He exhibits normal muscle tone. Coordination normal.  Skin: Skin is warm and dry. Capillary refill takes less than 2 seconds. No rash noted. He is not diaphoretic. No erythema. No pallor.  Psychiatric: He has a normal mood and affect. His behavior is normal. Judgment and thought content normal.  Nursing note and vitals reviewed.  Assessment/Plan: 1. Routine general  medical examination at a health care facility - Encouraged to continue to work on weight loss.  - One year follow up or sooner if needed  - Basic metabolic panel - CBC with Differential/Platelet - Hemoglobin A1c - Hepatic function panel - Lipid panel - TSH - PSA  2. Essential hypertension - Will restart on Cardizem 120 mg - Return precautions reviewed  - Basic metabolic panel - CBC with Differential/Platelet - Hemoglobin A1c - Hepatic function panel - Lipid panel - TSH - PSA - diltiazem (CARDIZEM CD) 120 MG 24 hr capsule; Take 1 capsule (120 mg total) by mouth daily. --patient needs office visit for further refills  Dispense: 90 capsule; Refill: 0  3. Type 2 diabetes mellitus with other specified complication, without long-term current use of insulin (HCC) - Will check A1c and decide on medications  - Follow up in 3 months  - Basic metabolic panel - CBC with Differential/Platelet - Hemoglobin A1c - Hepatic function panel - Lipid panel - TSH - PSA - Microalbumin / creatinine urine ratio  4. Need for influenza vaccination  - Flu Vaccine QUAD 6+ mos PF IM (Fluarix Quad PF)  5. Encounter for screening for HIV  - HIV Antibody (routine testing w rflx)  [name], NP

## 2018-10-10 NOTE — Patient Instructions (Addendum)
It was great seeing you today   I will follow up with you regarding your lab work   Once I get your blood work back we will make a decision on what medication to start you on for diabetes.   Please continue to work on weight loss through diet and exercise   Follow up in 3 months

## 2018-10-11 LAB — HIV ANTIBODY (ROUTINE TESTING W REFLEX): HIV 1&2 Ab, 4th Generation: NONREACTIVE

## 2019-01-04 ENCOUNTER — Other Ambulatory Visit: Payer: Self-pay | Admitting: Adult Health

## 2019-01-04 DIAGNOSIS — I10 Essential (primary) hypertension: Secondary | ICD-10-CM

## 2019-01-10 ENCOUNTER — Encounter: Payer: Self-pay | Admitting: Adult Health

## 2019-01-10 ENCOUNTER — Ambulatory Visit: Payer: BLUE CROSS/BLUE SHIELD | Admitting: Adult Health

## 2019-01-10 VITALS — BP 124/86 | Temp 97.6°F | Wt 256.0 lb

## 2019-01-10 DIAGNOSIS — E119 Type 2 diabetes mellitus without complications: Secondary | ICD-10-CM

## 2019-01-10 LAB — POCT GLYCOSYLATED HEMOGLOBIN (HGB A1C): HbA1c, POC (controlled diabetic range): 9.5 % — AB (ref 0.0–7.0)

## 2019-01-10 MED ORDER — EMPAGLIFLOZIN 10 MG PO TABS: 10.0000 mg | ORAL_TABLET | Freq: Every day | ORAL | 0 refills | Status: DC

## 2019-01-10 NOTE — Progress Notes (Signed)
Subjective:    Patient ID: Jason [name], male    DOB: 06-May-1956, 63 y.o.   MRN: [id number]  HPI 63 year old male who  has a past medical history of Bell palsy, Diabetes mellitus without complication ([place]), HTN (hypertension), and Hyperglycemia.  He presents to the office today for follow up regarding DM. During his last visit his A1c was 14. He had been out of his medication ( Jardiance and Metformin) for some time. He was restarted on these medications and glipizide 5 mg XR was added.  He reports that he has been taking his medication as directed. His diet was poor over the holidays and he has not been exercising on a routine basis   He reports that when he was taking Metformin BID but this caused him to have diarrhea so he cut this back to one time a day.   He plans to get back into exercising and is working on diet through portion control    Review of Systems See HPI   Past Medical History:  Diagnosis Date  . Bell palsy   . Diabetes mellitus without complication ([place])   . HTN (hypertension)   . Hyperglycemia     Social History   Socioeconomic History  . Marital status: Married    Spouse name: Not on file  . Number of children: 2  . Years of education: 49  . Highest education level: Not on file  Occupational History  . Occupation: [phone]    Employer: [employer]  . 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 Financial resource strain: Not on file  . Food insecurity:    Worry: Not on file    Inability: Not on file  . Transportation needs:    Medical: Not on file    Non-medical: Not on file  Tobacco Use  . Smoking status: Never Smoker  . Smokeless tobacco: Never Used  Substance and Sexual Activity  . Alcohol use: Yes    Alcohol/week: 6.0 standard drinks    Types: 6 Cans of beer per week    Comment: on weekends  . Drug use: No  . Sexual activity: Not on file  Lifestyle  . Physical activity:    Days per week: Not on file    Minutes per  session: Not on file  . Stress: Not on file  Relationships  . Social connections:    Talks on phone: Not on file    Gets together: Not on file    Attends religious service: Not on file    Active member of club or organization: Not on file    Attends meetings of clubs or organizations: Not on file    Relationship status: Not on file  . Intimate partner violence:    Fear of current or ex partner: Not on file    Emotionally abused: Not on file    Physically abused: Not on file    Forced sexual activity: Not on file  Other Topics Concern  . 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       Fun: fix cars, drag race     Past Surgical History:  Procedure Laterality Date  . bellpalsy    . COLONOSCOPY W/ POLYPECTOMY      Family History  Problem Relation Age of Onset  . Stroke Mother   . Colon  cancer Brother   . High blood pressure Brother   . Colon cancer Other        1st degree relative <60  . Diabetes Other        1st degree relative   . Stroke Other   . Colon cancer Sister   . High blood pressure Sister     Allergies  Allergen Reactions  . Lisinopril     hyperkalemia    Current Outpatient Medications on File Prior to Visit  Medication Sig Dispense Refill  . Blood Glucose Monitoring Suppl (ONE TOUCH ULTRA 2) w/Device KIT Use the device daily to check your blood sugars 1-4 times as instructed. 1 each 0  . diltiazem (CARDIZEM CD) 120 MG 24 hr capsule TAKE 1 CAPSULE (120 MG TOTAL) BY MOUTH DAILY. --PATIENT NEEDS OFFICE VISIT FOR FURTHER REFILLS 90 capsule 0  . glipiZIDE (GLUCOTROL XL) 5 MG 24 hr tablet TAKE 1 TABLET BY MOUTH EVERY DAY WITH BREAKFAST 90 tablet 0  . glucose blood (ONE TOUCH ULTRA TEST) test strip Use as instructed 100 each 12  . Lancets (ONETOUCH ULTRASOFT) lancets Use as instructed 100 each 12  . metFORMIN (GLUCOPHAGE) 1000 MG tablet TAKE 1 TABLET (1,000 MG TOTAL) BY MOUTH 2  (TWO) TIMES DAILY WITH A MEAL. 180 tablet 0   No current facility-administered medications on file prior to visit.     BP 124/86   Temp 97.6 F (36.4 C)   Wt 256 lb (116.1 kg)   BMI 32.43 kg/m       Objective:   Physical Exam Vitals signs and nursing note reviewed.  Constitutional:      Appearance: Normal appearance.  Cardiovascular:     Rate and Rhythm: Normal rate and regular rhythm.     Pulses: Normal pulses.     Heart sounds: Normal heart sounds.  Pulmonary:     Effort: Pulmonary effort is normal.     Breath sounds: Normal breath sounds.  Musculoskeletal: Normal range of motion.  Skin:    General: Skin is warm and dry.  Neurological:     General: No focal deficit present.     Mental Status: Mental status is at baseline.  Psychiatric:        Mood and Affect: Mood normal.        Behavior: Behavior normal.        Thought Content: Thought content normal.        Judgment: Judgment normal.       Assessment & Plan:  1. Type 2 diabetes mellitus without complication, without long-term current use of insulin (HCC)  - POCT A1C- 9.5. Has improved from 14.  - He would like to wait for three months before increasing Glipzide to 10 mg. He is going to work on diet and exercise  - empagliflozin (JARDIANCE) 10 MG TABS tablet; Take 10 mg by mouth daily.  Dispense: 90 tablet; Refill: 0  [name], NP

## 2019-04-09 ENCOUNTER — Other Ambulatory Visit: Payer: Self-pay | Admitting: Family Medicine

## 2019-04-09 ENCOUNTER — Other Ambulatory Visit: Payer: Self-pay

## 2019-04-09 ENCOUNTER — Other Ambulatory Visit (INDEPENDENT_AMBULATORY_CARE_PROVIDER_SITE_OTHER): Payer: BLUE CROSS/BLUE SHIELD

## 2019-04-09 DIAGNOSIS — E119 Type 2 diabetes mellitus without complications: Secondary | ICD-10-CM

## 2019-04-10 ENCOUNTER — Encounter: Payer: Self-pay | Admitting: Adult Health

## 2019-04-10 ENCOUNTER — Ambulatory Visit (INDEPENDENT_AMBULATORY_CARE_PROVIDER_SITE_OTHER): Payer: BLUE CROSS/BLUE SHIELD | Admitting: Adult Health

## 2019-04-10 DIAGNOSIS — E1169 Type 2 diabetes mellitus with other specified complication: Secondary | ICD-10-CM

## 2019-04-10 LAB — HEMOGLOBIN A1C: Hgb A1c MFr Bld: 11.5 % — ABNORMAL HIGH (ref 4.6–6.5)

## 2019-04-10 MED ORDER — EMPAGLIFLOZIN 25 MG PO TABS: 25.0000 mg | ORAL_TABLET | Freq: Every day | ORAL | 0 refills | Status: AC

## 2019-04-10 MED ORDER — GLIPIZIDE ER 10 MG PO TB24: 10.0000 mg | ORAL_TABLET | Freq: Every day | ORAL | 0 refills | Status: DC

## 2019-04-10 NOTE — Progress Notes (Signed)
Virtual Visit via Video Note  I connected with Jason [name] on 04/10/19 at  8:00 AM EDT by a video enabled telemedicine application and verified that I am speaking with the correct person using two identifiers.  Location patient: home Location provider:work or home office Persons participating in the virtual visit: patient, provider  I discussed the limitations of evaluation and management by telemedicine and the availability of in person appointments. The patient expressed understanding and agreed to proceed.   HPI: 63 year old male who is being evaluated today for 33-monthfollow-up regarding diabetes mellitus.  Tober 2019 his A1c was 14, this was due to him being out of his medications for an extended period of time.  He was placed on metformin 1000 mg BID, Jardiance 10 mg , and glipizide XR 5 mg, and his A1c had dropped to 9.5 in January.  Today he reports that he has been exercising, walking and riding his bike during the COVID restrictions and he has been working on diet "trying not to eat anything bad".  Is not checking his blood sugars on a routine basis but reports that the last time he did it was in the low 100s.  He denies any hypoglycemic events.  A1c Trend 09/2018 - 14 12/2018 - 9.5  03/2019 - 11.5   ROS: See pertinent positives and negatives per HPI.  Past Medical History:  Diagnosis Date  . Bell palsy   . [place]   . HTN (hypertension)   . Hyperglycemia     Past Surgical History:  Procedure Laterality Date  . bellpalsy    . COLONOSCOPY W/ POLYPECTOMY      Family History  Problem Relation Age of Onset  . Stroke Mother   . Colon cancer Brother   . High blood pressure Brother   . Colon cancer Other        1st degree relative <60  . Diabetes Other        1st degree relative   . Stroke Other   . Colon cancer Sister   . High blood pressure Sister      Current Outpatient Medications:  .  Blood Glucose Monitoring Suppl  (ONE TOUCH ULTRA 2) w/Device KIT, Use the device daily to check your blood sugars 1-4 times as instructed., Disp: 1 each, Rfl: 0 .  diltiazem (CARDIZEM CD) 120 MG 24 hr capsule, TAKE 1 CAPSULE (120 MG TOTAL) BY MOUTH DAILY. --PATIENT NEEDS OFFICE VISIT FOR FURTHER REFILLS, Disp: 90 capsule, Rfl: 0 .  empagliflozin (JARDIANCE) 25 MG TABS tablet, Take 25 mg by mouth daily., Disp: 90 tablet, Rfl: 0 .  glipiZIDE (GLUCOTROL XL) 10 MG 24 hr tablet, Take 1 tablet (10 mg total) by mouth daily with breakfast., Disp: 90 tablet, Rfl: 0 .  glucose blood (ONE TOUCH ULTRA TEST) test strip, Use as instructed, Disp: 100 each, Rfl: 12 .  Lancets (ONETOUCH ULTRASOFT) lancets, Use as instructed, Disp: 100 each, Rfl: 12 .  metFORMIN (GLUCOPHAGE) 1000 MG tablet, TAKE 1 TABLET (1,000 MG TOTAL) BY MOUTH 2 (TWO) TIMES DAILY WITH A MEAL., Disp: 180 tablet, Rfl: 0  EXAM:  VITALS per patient if applicable:  GENERAL: alert, oriented, appears well and in no acute distress  HEENT: atraumatic, conjunttiva clear, no obvious abnormalities on inspection of external nose and ears  NECK: normal movements of the head and neck  LUNGS: on inspection no signs of respiratory distress, breathing rate appears normal, no obvious gross SOB, gasping or wheezing  CV: no  obvious cyanosis  MS: moves all visible extremities without noticeable abnormality  PSYCH/NEURO: pleasant and cooperative, no obvious depression or anxiety, speech and thought processing grossly intact  ASSESSMENT AND PLAN:  Discussed the following assessment and plan:  A1c has increased to 11.5.  Will increase glipizide to 10 mg extended release daily and increase Jardiance to 25 mg daily.  He was advised to continue to work on lifestyle modifications.  We will retest in 3 months, I would like him to start monitoring his blood sugars at least twice a day.  Type 2 diabetes mellitus with other specified complication, without long-term current use of insulin (HCC) -  Plan: glipiZIDE (GLUCOTROL XL) 10 MG 24 hr tablet, empagliflozin (JARDIANCE) 25 MG TABS tablet  I discussed the assessment and treatment plan with the patient. The patient was provided an opportunity to ask questions and all were answered. The patient agreed with the plan and demonstrated an understanding of the instructions.   The patient was advised to call back or seek an in-person evaluation if the symptoms worsen or if the condition fails to improve as anticipated.   [name], NP

## 2019-04-19 ENCOUNTER — Encounter: Payer: Self-pay | Admitting: Gastroenterology

## 2019-05-16 ENCOUNTER — Ambulatory Visit (AMBULATORY_SURGERY_CENTER): Payer: Self-pay | Admitting: *Deleted

## 2019-05-16 ENCOUNTER — Other Ambulatory Visit: Payer: Self-pay

## 2019-05-16 VITALS — Ht 75.0 in | Wt 245.0 lb

## 2019-05-16 DIAGNOSIS — Z86010 Personal history of colonic polyps: Secondary | ICD-10-CM

## 2019-05-16 MED ORDER — NA SULFATE-K SULFATE-MG SULF 17.5-3.13-1.6 GM/177ML PO SOLN: ORAL | 0 refills | Status: DC

## 2019-05-16 NOTE — Progress Notes (Signed)
Pt's previsit is done over the phone and all paperwork (prep instructions, blank consent form to just read over, pre-procedure acknowledgement form and stamped envelope) sent to patient  No egg or soy allergy  Pt has never been intubated; no trouble moving neck  No home oxygen used or hx of sleep apnea  No diet medications taken  Registered in [facility]

## 2019-05-29 ENCOUNTER — Telehealth: Payer: Self-pay | Admitting: Gastroenterology

## 2019-05-29 NOTE — Telephone Encounter (Signed)

## 2019-05-30 ENCOUNTER — Other Ambulatory Visit: Payer: Self-pay

## 2019-05-30 ENCOUNTER — Encounter: Payer: Self-pay | Admitting: Gastroenterology

## 2019-05-30 ENCOUNTER — Ambulatory Visit (AMBULATORY_SURGERY_CENTER): Payer: Medicare Other | Admitting: Gastroenterology

## 2019-05-30 VITALS — BP 138/84 | HR 53 | Temp 98.6°F | Resp 16 | Ht 75.0 in | Wt 245.0 lb

## 2019-05-30 DIAGNOSIS — D120 Benign neoplasm of cecum: Secondary | ICD-10-CM

## 2019-05-30 DIAGNOSIS — D122 Benign neoplasm of ascending colon: Secondary | ICD-10-CM | POA: Diagnosis not present

## 2019-05-30 DIAGNOSIS — Z800 Family history of malignant neoplasm of digestive organs: Secondary | ICD-10-CM

## 2019-05-30 DIAGNOSIS — Z86010 Personal history of colonic polyps: Secondary | ICD-10-CM

## 2019-05-30 DIAGNOSIS — D125 Benign neoplasm of sigmoid colon: Secondary | ICD-10-CM

## 2019-05-30 DIAGNOSIS — D123 Benign neoplasm of transverse colon: Secondary | ICD-10-CM

## 2019-05-30 MED ORDER — SODIUM CHLORIDE 0.9 % IV SOLN: 500.0000 mL | Freq: Once | INTRAVENOUS | Status: DC

## 2019-05-30 NOTE — Patient Instructions (Addendum)
Thank you for allowing [place] to care for you today!  Await pathology results by mail, approximately 7-10 days.  Resume previous diet and medications today.  Return to your normal activities tomorrow.  Next recommended colonoscopy date will be determined after pathology results returned.         YOU HAD AN ENDOSCOPIC PROCEDURE TODAY AT [facility]:   Refer to the procedure report that was given to you for any specific questions about what was found during the examination.  If the procedure report does not answer your questions, please call your gastroenterologist to clarify.  If you requested that your care partner not be given the details of your procedure findings, then the procedure report has been included in a sealed envelope for you to review at your convenience later.  YOU SHOULD EXPECT: Some feelings of bloating in the abdomen. Passage of more gas than usual.  Walking can help get rid of the air that was put into your GI tract during the procedure and reduce the bloating. If you had a lower endoscopy (such as a colonoscopy or flexible sigmoidoscopy) you may notice spotting of blood in your stool or on the toilet paper. If you underwent a bowel prep for your procedure, you may not have a normal bowel movement for a few days.  Please Note:  You might notice some irritation and congestion in your nose or some drainage.  This is from the oxygen used during your procedure.  There is no need for concern and it should clear up in a day or so.  SYMPTOMS TO REPORT IMMEDIATELY:   Following lower endoscopy (colonoscopy or flexible sigmoidoscopy):  Excessive amounts of blood in the stool  Significant tenderness or worsening of abdominal pains  Swelling of the abdomen that is new, acute  Fever of 100F or higher    For urgent or emergent issues, a gastroenterologist can be reached at any hour by calling [phone].   DIET:  We do recommend a small meal at first, but  then you may proceed to your regular diet.  Drink plenty of fluids but you should avoid alcoholic beverages for 24 hours.  ACTIVITY:  You should plan to take it easy for the rest of today and you should NOT DRIVE or use heavy machinery until tomorrow (because of the sedation medicines used during the test).    FOLLOW UP: Our staff will call the number listed on your records 48-72 hours following your procedure to check on you and address any questions or concerns that you may have regarding the information given to you following your procedure. If we do not reach you, we will leave a message.  We will attempt to reach you two times.  During this call, we will ask if you have developed any symptoms of COVID 19. If you develop any symptoms (ie: fever, flu-like symptoms, shortness of breath, cough etc.) before then, please call [phone].  If you test positive for Covid 19 in the 2 weeks post procedure, please call and report this information to [place].    If any biopsies were taken you will be contacted by phone or by letter within the next 1-3 weeks.  Please call us at [phone] if you have not heard about the biopsies in 3 weeks.    SIGNATURES/CONFIDENTIALITY: You and/or your care partner have signed paperwork which will be entered into your electronic medical record.  These signatures attest to the fact that that the information  above on your After Visit Summary has been reviewed and is understood.  Full responsibility of the confidentiality of this discharge information lies with you and/or your care-partner. 

## 2019-05-30 NOTE — Progress Notes (Signed)
Called to room to assist during endoscopic procedure.  Patient ID and intended procedure confirmed with present staff. Received instructions for my participation in the procedure from the performing physician.  

## 2019-05-30 NOTE — Op Note (Signed)
[facility] Patient Name: Jason [name] Procedure Date: 05/30/2019 10:44 AM MRN: [id number] Endoscopist: Mallie Mussel L. Jason [name] , MD Age: 63 Referring MD:  Date of Birth: May 30, 1956 Gender: Male Account #: [id number] Procedure:                Colonoscopy Indications:              Surveillance: Personal history of adenomatous                            polyps on last colonoscopy 3 years ago (TA x 8 in                            03/2016) - also family history of colon cancer in 2                            siblings Medicines:                Monitored Anesthesia Care Procedure:                Pre-Anesthesia Assessment:                           - Prior to the procedure, a History and Physical                            was performed, and patient medications and                            allergies were reviewed. The patient's tolerance of                            previous anesthesia was also reviewed. The risks                            and benefits of the procedure and the sedation                            options and risks were discussed with the patient.                            All questions were answered, and informed consent                            was obtained. Prior Anticoagulants: The patient has                            taken no previous anticoagulant or antiplatelet                            agents. ASA Grade Assessment: II - A patient with                            mild systemic disease. After reviewing the risks  and benefits, the patient was deemed in                            satisfactory condition to undergo the procedure.                           After obtaining informed consent, the colonoscope                            was passed under direct vision. Throughout the                            procedure, the patient's blood pressure, pulse, and                            oxygen saturations were monitored continuously. The                            Colonoscope was introduced through the anus and                            advanced to the the cecum, identified by                            appendiceal orifice and ileocecal valve. The                            colonoscopy was performed without difficulty. The                            patient tolerated the procedure well. The quality                            of the bowel preparation was good. The ileocecal                            valve, appendiceal orifice, and rectum were                            photographed. Scope In: 10:50:06 AM Scope Out: 11:04:21 AM Scope Withdrawal Time: 0 hours 12 minutes 30 seconds  Total Procedure Duration: 0 hours 14 minutes 15 seconds  Findings:                 The perianal and digital rectal examinations were                            normal.                           Two sessile polyps were found in the ascending                            colon. The polyps were diminutive in size. These  polyps were removed with a cold biopsy forceps.                            Resection and retrieval were complete.                           Two sessile polyps were found in the sigmoid colon                            and transverse colon. The polyps were diminutive in                            size. These polyps were removed with a cold biopsy                            forceps. Resection and retrieval were complete.                           The exam was otherwise without abnormality on                            direct and retroflexion views. Complications:            No immediate complications. Estimated Blood Loss:     Estimated blood loss was minimal. Impression:               - Two diminutive polyps in the ascending colon,                            removed with a cold biopsy forceps. Resected and                            retrieved.                           - Two diminutive polyps in the sigmoid  colon and in                            the transverse colon, removed with a cold biopsy                            forceps. Resected and retrieved.                           - The examination was otherwise normal on direct                            and retroflexion views. Recommendation:           - Patient has a contact number available for                            emergencies. The signs and symptoms of potential  delayed complications were discussed with the                            patient. Return to normal activities tomorrow.                            Written discharge instructions were provided to the                            patient.                           - Resume previous diet.                           - Continue present medications.                           - Await pathology results.                           - Repeat colonoscopy is recommended for                            surveillance. The colonoscopy date will be                            determined after pathology results from today's                            exam become available for review. [name] L. Jason Carrow, MD 05/30/2019 11:12:59 AM This report has been signed electronically.

## 2019-05-30 NOTE — Progress Notes (Signed)
Pt's states no medical or surgical changes since previsit or office visit. 

## 2019-05-30 NOTE — Progress Notes (Signed)
Courtney Washington took temp and Judy Branson took vitals. 

## 2019-06-03 ENCOUNTER — Encounter: Payer: Self-pay | Admitting: Gastroenterology

## 2019-06-03 ENCOUNTER — Telehealth: Payer: Self-pay

## 2019-06-03 NOTE — Telephone Encounter (Signed)
  Follow up Call-  Call back number 05/30/2019  Post procedure Call Back phone  # [phone]  Permission to leave phone message Yes  Some recent data might be hidden     Patient questions:  Do you have a fever, pain , or abdominal swelling? No. Pain Score  0 *  Have you tolerated food without any problems? Yes.    Have you been able to return to your normal activities? Yes.    Do you have any questions about your discharge instructions: Diet   No. Medications  No. Follow up visit  No.  Do you have questions or concerns about your Care? No.  Actions: * If pain score is 4 or above: No action needed, pain <4. 1. Have you developed a fever since your procedure? no  2.   Have you had an respiratory symptoms (SOB or cough) since your procedure? no  3.   Have you tested positive for COVID 19 since your procedure no  4.   Have you had any family members/close contacts diagnosed with the COVID 19 since your procedure?  no   If yes to any of these questions please route to Joylene John, RN and Alphonsa Gin, Therapist, sports.

## 2019-06-22 ENCOUNTER — Other Ambulatory Visit: Payer: Self-pay | Admitting: Adult Health

## 2019-06-22 DIAGNOSIS — I10 Essential (primary) hypertension: Secondary | ICD-10-CM

## 2019-06-25 NOTE — Telephone Encounter (Signed)
Sent to the pharmacy by e-scribe. 

## 2019-07-05 ENCOUNTER — Other Ambulatory Visit: Payer: Self-pay | Admitting: Adult Health

## 2019-07-05 DIAGNOSIS — E1169 Type 2 diabetes mellitus with other specified complication: Secondary | ICD-10-CM

## 2019-07-09 ENCOUNTER — Other Ambulatory Visit: Payer: Self-pay

## 2019-07-09 ENCOUNTER — Other Ambulatory Visit: Payer: Self-pay | Admitting: Family Medicine

## 2019-07-09 ENCOUNTER — Other Ambulatory Visit (INDEPENDENT_AMBULATORY_CARE_PROVIDER_SITE_OTHER): Payer: Medicare Other

## 2019-07-09 DIAGNOSIS — E1169 Type 2 diabetes mellitus with other specified complication: Secondary | ICD-10-CM

## 2019-07-09 LAB — HEMOGLOBIN A1C: Hgb A1c MFr Bld: 9.3 % — ABNORMAL HIGH (ref 4.6–6.5)

## 2019-07-09 NOTE — Telephone Encounter (Signed)
Sent to the pharmacy by e-scribe for 90 days.  Pt now scheduled for A1C and follow up.

## 2019-07-11 ENCOUNTER — Telehealth (INDEPENDENT_AMBULATORY_CARE_PROVIDER_SITE_OTHER): Payer: Medicare Other | Admitting: Adult Health

## 2019-07-11 ENCOUNTER — Other Ambulatory Visit: Payer: Self-pay

## 2019-07-11 DIAGNOSIS — E1169 Type 2 diabetes mellitus with other specified complication: Secondary | ICD-10-CM | POA: Diagnosis not present

## 2019-07-11 MED ORDER — METFORMIN HCL 1000 MG PO TABS: 1000.0000 mg | ORAL_TABLET | Freq: Two times a day (BID) | ORAL | 0 refills | Status: DC

## 2019-07-11 MED ORDER — JARDIANCE 25 MG PO TABS: 25.0000 mg | ORAL_TABLET | Freq: Every day | ORAL | 0 refills | Status: DC

## 2019-07-11 NOTE — Progress Notes (Signed)
Virtual Visit via Video Note  I connected with Jason [name] on 07/11/19 at 10:00 AM EDT by a video enabled telemedicine application and verified that I am speaking with the correct person using two identifiers.  Location patient: home Location provider:work or home office Persons participating in the virtual visit: patient, provider  I discussed the limitations of evaluation and management by telemedicine and the availability of in person appointments. The patient expressed understanding and agreed to proceed.   HPI: 63 year old male who is being evaluated today for 59-monthfollow-up regarding diabetes.  Is currently prescribed glipizide 10 mg extended release, metformin 1000 mg twice daily, and Jardiance 10 mg daily.  In April his A1c had increased from 9.5-11.4In April his A1c had increased from 9.5. to 11.5. At this time glipzide was increased from 5 mg to 10 mg ER and Jardiance was increased from 10 mg to 25 mg.   Today he reports that when he went to pick up Jardiance his insurance company wanted $700 for the medication. Unfortunately he did not let me know this. He has not been taking Jardiance. He reports that when he checks his BS he is getting readings between 160-200. He is working on diet and exercise and has been able to lose about 5 pounds. Current weight is 240.   He has changed insurances and would like for me to send in JSeboyetaagain     ROS: See pertinent positives and negatives per HPI.  Past Medical History:  Diagnosis Date  . Bell palsy   . [place]   . HTN (hypertension)   . Hyperglycemia     Past Surgical History:  Procedure Laterality Date  . bellpalsy    . COLONOSCOPY    . COLONOSCOPY W/ POLYPECTOMY      Family History  Problem Relation Age of Onset  . Stroke Mother   . Colon cancer Brother   . High blood pressure Brother   . Stomach cancer Brother   . Colon cancer Other        1st degree relative <60  .  Diabetes Other        1st degree relative   . Stroke Other   . Colon cancer Sister   . High blood pressure Sister   . Esophageal cancer Neg Hx   . Rectal cancer Neg Hx        Current Outpatient Medications:  .  Blood Glucose Monitoring Suppl (ONE TOUCH ULTRA 2) w/Device KIT, Use the device daily to check your blood sugars 1-4 times as instructed., Disp: 1 each, Rfl: 0 .  diltiazem (CARDIZEM CD) 120 MG 24 hr capsule, Take 1 capsule (120 mg total) by mouth daily., Disp: 90 capsule, Rfl: 0 .  glipiZIDE (GLUCOTROL XL) 10 MG 24 hr tablet, TAKE 1 TABLET BY MOUTH DAILY WITH BREAKFAST, Disp: 90 tablet, Rfl: 0 .  glucose blood (ONE TOUCH ULTRA TEST) test strip, Use as instructed, Disp: 100 each, Rfl: 12 .  Lancets (ONETOUCH ULTRASOFT) lancets, Use as instructed, Disp: 100 each, Rfl: 12 .  metFORMIN (GLUCOPHAGE) 1000 MG tablet, TAKE 1 TABLET (1,000 MG TOTAL) BY MOUTH 2 (TWO) TIMES DAILY WITH A MEAL. (Patient taking differently: Take 1,000 mg by mouth daily with breakfast. ), Disp: 180 tablet, Rfl: 0  EXAM:  VITALS per patient if applicable:  GENERAL: alert, oriented, appears well and in no acute distress  HEENT: atraumatic, conjunttiva clear, no obvious abnormalities on inspection of external nose and ears  NECK:  normal movements of the head and neck  LUNGS: on inspection no signs of respiratory distress, breathing rate appears normal, no obvious gross SOB, gasping or wheezing  CV: no obvious cyanosis  MS: moves all visible extremities without noticeable abnormality  PSYCH/NEURO: pleasant and cooperative, no obvious depression or anxiety, speech and thought processing grossly intact  ASSESSMENT AND PLAN:  Discussed the following assessment and plan:  1. Type 2 diabetes mellitus with other specified complication, without long-term current use of insulin (HCC) - A1c has improved to 9.3  - Will send in Jardiance 25 mg tablets. Advised to inform me if insurance does not cover  -  Continue to work on lifestyle modifications. If not lower in three months will consider adding Trulicity - metFORMIN (GLUCOPHAGE) 1000 MG tablet; Take 1 tablet (1,000 mg total) by mouth 2 (two) times daily with a meal.  Dispense: 180 tablet; Refill: 0 - empagliflozin (JARDIANCE) 25 MG TABS tablet; Take 25 mg by mouth daily.  Dispense: 90 tablet; Refill: 0  I discussed the assessment and treatment plan with the patient. The patient was provided an opportunity to ask questions and all were answered. The patient agreed with the plan and demonstrated an understanding of the instructions.   The patient was advised to call back or seek an in-person evaluation if the symptoms worsen or if the condition fails to improve as anticipated.   [name], NP

## 2019-09-05 ENCOUNTER — Other Ambulatory Visit: Payer: Self-pay

## 2019-09-05 ENCOUNTER — Telehealth (INDEPENDENT_AMBULATORY_CARE_PROVIDER_SITE_OTHER): Payer: Medicare Other | Admitting: Family Medicine

## 2019-09-05 ENCOUNTER — Encounter: Payer: Self-pay | Admitting: Family Medicine

## 2019-09-05 VITALS — Wt 247.0 lb

## 2019-09-05 DIAGNOSIS — E669 Obesity, unspecified: Secondary | ICD-10-CM

## 2019-09-05 DIAGNOSIS — Z0000 Encounter for general adult medical examination without abnormal findings: Secondary | ICD-10-CM

## 2019-09-05 DIAGNOSIS — E1169 Type 2 diabetes mellitus with other specified complication: Secondary | ICD-10-CM

## 2019-09-05 DIAGNOSIS — I10 Essential (primary) hypertension: Secondary | ICD-10-CM

## 2019-09-05 NOTE — Progress Notes (Signed)
Medicare Annual Preventive Care Visit  (initial annual wellness or annual wellness exam)  Virtual Visit via Video Note  I connected with [name]  on 04/30/19 at  3:20 PM EDT by a video enabled telemedicine application and verified that I am speaking with the correct person using two identifiers.  Location patient: home Location provider:work or home office Persons participating in the virtual visit: patient, provider  Concerns and/or follow up today:  Jason [name] is a pleasant 63 yo M with with a PMH significant for DM and Hypertension. He saw His PCP recently in follow up for chronic disease management. His diabetes was uncontrolled on a very recent check, though was improved from the prior check. Has been walking more and trying to stay away from pork more. Has been trying to eat healthier. He has been walking on a regular basis. He asks about Gatorade and if this is ahealthy option.   HM due: -lifestyle recs for diabetes -pneumonia vaccine - agrees to set up -flu vaccine - agrees to schedule  See HM section in Epic for other details of completed HM. See scanned documentation under Media Tab for further documentation HPI, health risk assessment. See Media Tab and Care Teams sections in Epic for other providers.  ROS: negative for report of fevers, unintentional weight loss, vision changes, vision loss, hearing loss or change, chest pain, sob, hemoptysis, melena, hematochezia, hematuria, genital discharge or lesions, falls, bleeding or bruising, loc, thoughts of suicide or self harm, memory loss  1.) Patient-completed health risk assessment  - completed and reviewed, see scanned documentation  2.) Review of Medical History: -PMH, PSH, Family History and current specialty and care providers reviewed and updated and listed below  - see scanned in document in chart and below Patient Care Team: [name], NP as PCP - General (Family Medicine)   Past Medical History:  Diagnosis  Date  . Bell palsy   . Diabetes mellitus without complication ([place])   . HTN (hypertension)   . Hyperglycemia     Past Surgical History:  Procedure Laterality Date  . bellpalsy    . COLONOSCOPY    . COLONOSCOPY W/ POLYPECTOMY      Social History   Socioeconomic History  . Marital status: Married    Spouse name: Not on file  . Number of children: 2  . Years of education: 55  . Highest education level: Not on file  Occupational History  . Occupation: [id number]    Employer: [employer]  . 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 Financial resource strain: Not on file  . Food insecurity    Worry: Not on file    Inability: Not on file  . Transportation needs    Medical: Not on file    Non-medical: Not on file  Tobacco Use  . Smoking status: Never Smoker  . Smokeless tobacco: Never Used  Substance and Sexual Activity  . Alcohol use: Yes    Alcohol/week: 6.0 standard drinks    Types: 6 Cans of beer per week    Comment: on weekends  . Drug use: No  . Sexual activity: Not on file  Lifestyle  . Physical activity    Days per week: Not on file    Minutes per session: Not on file  . Stress: Not on file  Relationships  . Social Herbalist on phone: Not on file    Gets together: Not on file    Attends religious service: Not on  file    Active member of club or organization: Not on file    Attends meetings of clubs or organizations: Not on file    Relationship status: Not on file  . Intimate partner violence    Fear of current or ex partner: Not on file    Emotionally abused: Not on file    Physically abused: Not on file    Forced sexual activity: Not on file  Other Topics Concern  . 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       Fun: fix cars, drag race     Family History  Problem Relation Age of Onset  . Stroke Mother   . Colon cancer Brother   . High  blood pressure Brother   . Stomach cancer Brother   . Colon cancer Other        1st degree relative <60  . Diabetes Other        1st degree relative   . Stroke Other   . Colon cancer Sister   . High blood pressure Sister   . Esophageal cancer Neg Hx   . Rectal cancer Neg Hx     Current Outpatient Medications on File Prior to Visit  Medication Sig Dispense Refill  . Blood Glucose Monitoring Suppl (ONE TOUCH ULTRA 2) w/Device KIT Use the device daily to check your blood sugars 1-4 times as instructed. 1 each 0  . diltiazem (CARDIZEM CD) 120 MG 24 hr capsule Take 1 capsule (120 mg total) by mouth daily. 90 capsule 0  . empagliflozin (JARDIANCE) 25 MG TABS tablet Take 25 mg by mouth daily. 90 tablet 0  . glipiZIDE (GLUCOTROL XL) 10 MG 24 hr tablet TAKE 1 TABLET BY MOUTH DAILY WITH BREAKFAST 90 tablet 0  . glucose blood (ONE TOUCH ULTRA TEST) test strip Use as instructed 100 each 12  . Lancets (ONETOUCH ULTRASOFT) lancets Use as instructed 100 each 12  . metFORMIN (GLUCOPHAGE) 1000 MG tablet Take 1 tablet (1,000 mg total) by mouth 2 (two) times daily with a meal. 180 tablet 0   No current facility-administered medications on file prior to visit.      3.) Review of functional ability and level of safety:  Any difficulty hearing?  See scanned documentation, mild issues if in a very noisy room  History of falling?  See scanned documentation  Any trouble with IADLs - using a phone, using transportation, grocery shopping, preparing meals, doing housework, doing laundry, taking medications and managing money?  See scanned documentation  Advance Directives?  Discussed briefly and offered more resources and detailed discussion with our trained staff.   See summary of recommendations in Patient Instructions below.  4.) Physical Exam VITALS per patient if applicable: There were no vitals filed for this visit. Estimated body mass index is 30.87 kg/m as calculated from the following:    Height as of 05/30/19: 6' 3" (1.905 m).   Weight as of this encounter: 247 lb (112 kg). Body mass index is 30.87 kg/m.  EKG (optional): deferred  GENERAL: alert, oriented, appears well and in no acute distress; visual acuity grossly intact, full vision exam deferred due to pandemic and/or virtual encounter  HEENT: atraumatic, conjunttiva clear, no obvious abnormalities on inspection of external nose and ears  NECK: normal movements of the head and neck  LUNGS: on inspection no signs  of respiratory distress, breathing rate appears normal, no obvious gross SOB, gasping or wheezing  CV: no obvious cyanosis  MS: moves all visible extremities without noticeable abnormality  PSYCH/NEURO: pleasant and cooperative, no obvious depression or anxiety, speech and thought processing grossly intact, Cognitive function grossly intact  Depression screen [facility] 2/9 09/05/2019 02/02/2016  Decreased Interest 0 0  Down, Depressed, Hopeless 0 0  PHQ - 2 Score 0 0     See patient instructions for recommendations.  Education and counseling regarding the above review of health provided with a plan for the following: -see scanned patient completed form for further details -fall prevention strategies -not applicable in this patient, active and no fall risks -healthy lifestyle discussed at length -importance and resources for completing advanced directives discussed - he does not desire more information, he plans to do -see patient instructions below for any other recommendations provided  4)The following written screening schedule of preventive measures were reviewed with assessment and plan made per below, orders and patient instructions:      AAA screening done if applicable     Alcohol screening done and discussed risks, does not consume more then 2 drinks in a day or more then 7 in a week.     Obesity Screening and counseling done     STI screening (Hep C if born [birth year]-65) offered and per pt wishes      Tobacco Screening done       Pneumococcal (PPSV23 -one dose after 64, one before if risk factors), influenza yearly and hepatitis B vaccines (if high risk - end stage renal disease, IV drugs, homosexual men, live in home for mentally retarded, hemophilia receiving factors) ASSESSMENT/PLAN: due and offered - he wants to do at our office, sent message to scheduling to assist      Prostate cancer screening ASSESSMENT/PLAN: discussed risks/benefits available options, PSA done in the last 1 year      Colorectal cancer screening (FOBT yearly or flex sig q4y or colonoscopy q10y or barium enema q4y) ASSESSMENT/PLAN: utd - done 05/30/19      Diabetes outpatient self-management training services ASSESSMENT/PLAN: advised healthy lifestyle and diabetes management - discussed healthy diet at length, suggestion health whole foods based diet low in sweets, simple carbs and processed foods. Reviewed medications. Taking daily. Will sent message to schedulers to ensure follow up appt with PCP when due.      Screening for glaucoma(q1y if high risk - diabetes, FH, AA and > 50 or hispanic and > 65) ASSESSMENT/PLAN: utd or advised - advised yearly eye exam, discussed options. He agrees to arrange.      Medical nutritional therapy for individuals with diabetes or renal disease ASSESSMENT/PLAN: see orders      Cardiovascular screening blood tests (lipids q5y) ASSESSMENT/PLAN: see orders and labs - utd      Diabetes screening tests ASSESSMENT/PLAN: done recently with PCP - advised regular follow up with PCP for recheck again soon given uncontrolled diabetes, sent message to schedulers   7.) Summary:   Medicare annual wellness visit, initial -risk factors and conditions per above assessment were discussed and treatment, recommendations and referrals were offered per documentation above and orders and patient instructions.  Patient Instructions    [name] , Thank you for taking time to come for your  Medicare Wellness Visit. I appreciate your ongoing commitment to your health goals. Please review the following plan we discussed and let me know if I can assist you in the future.  These are the goals we discussed: Goals    We recommend the following healthy lifestyle for LIFE: 1) Eat a healthy clean diet.   TRY TO EAT: -at least 5-7 servings of low sugar, colorful, and nutrient rich vegetables per day (not corn, potatoes or bananas.) -berries are the best choice if you wish to eat fruit (only eat small amounts if trying to reduce weight)  -lean meets (fish, white meat of chicken or [place]) -vegan proteins for some meals - beans or tofu, whole grains, nuts and seeds -Replace bad fats with good fats - good fats include: fish, nuts and seeds, canola oil, olive oil -small amounts of low fat or non fat dairy -small amounts of100 % whole grains - check the lables -drink plenty of water  AVOID: -SUGAR, sweets, anything with added sugar, corn syrup or sweeteners - must read labels as even foods advertised as "healthy" often are loaded with sugar -if you must have a sweetener, small amounts of stevia may be best -sweetened beverages and artificially sweetened beverages -simple starches (rice, bread, potatoes, pasta, chips, etc - small amounts of 100% whole grains are ok) -red meat, pork, butter -fried foods, fast food, processed food, excessive dairy, eggs and coconut.  2)Get at least 150 minutes of sweaty aerobic exercise per week.  3)Set up advanced directives  4) get flu and pneumonia vaccines, I sent a message to my schedulers to assist. Please call our office if someone does not contact you in the next few days.      This is a list of the screening recommended for you and due dates:  Health Maintenance  Topic Date Due  . Pneumonia vaccines (1 of 2 - PCV13) Schedule at office  . Flu Shot  Schedule at office  . Urine Protein Check  10/11/2019  . Eye exam for diabetics  09/12/2019   . Complete foot exam   10/11/2019  . Hemoglobin A1C  01/09/2020  . Colon Cancer Screening  05/29/2022  . Tetanus Vaccine  02/01/2026  .  Hepatitis C: One time screening is recommended by Center for Disease Control  (CDC) for  adults born from [birth year] through 1967.   Completed  . HIV Screening  Completed    Lucretia Kern, DO

## 2019-09-05 NOTE — Patient Instructions (Signed)
  Jason [name] , Thank you for taking time to come for your Medicare Wellness Visit. I appreciate your ongoing commitment to your health goals. Please review the following plan we discussed and let me know if I can assist you in the future.   These are the goals we discussed: Goals    We recommend the following healthy lifestyle for LIFE: 1) Eat a healthy clean diet.   TRY TO EAT: -at least 5-7 servings of low sugar, colorful, and nutrient rich vegetables per day (not corn, potatoes or bananas.) -berries are the best choice if you wish to eat fruit (only eat small amounts if trying to reduce weight)  -lean meets (fish, white meat of chicken or [place]) -vegan proteins for some meals - beans or tofu, whole grains, nuts and seeds -Replace bad fats with good fats - good fats include: fish, nuts and seeds, canola oil, olive oil -small amounts of low fat or non fat dairy -small amounts of100 % whole grains - check the lables -drink plenty of water  AVOID: -SUGAR, sweets, anything with added sugar, corn syrup or sweeteners - must read labels as even foods advertised as "healthy" often are loaded with sugar -if you must have a sweetener, small amounts of stevia may be best -sweetened beverages and artificially sweetened beverages -simple starches (rice, bread, potatoes, pasta, chips, etc - small amounts of 100% whole grains are ok) -red meat, pork, butter -fried foods, fast food, processed food, excessive dairy, eggs and coconut.  2)Get at least 150 minutes of sweaty aerobic exercise per week.  3)Set up advanced directives  4) get flu and pneumonia vaccines, I sent a message to my schedulers to assist. Please call our office if someone does not contact you in the next few days.      This is a list of the screening recommended for you and due dates:  Health Maintenance  Topic Date Due  . Pneumonia vaccines (1 of 2 - PCV13) Schedule at office  . Flu Shot  Schedule at office  . Urine  Protein Check  10/11/2019  . Eye exam for diabetics  09/12/2019  . Complete foot exam   10/11/2019  . Hemoglobin A1C  01/09/2020  . Colon Cancer Screening  05/29/2022  . Tetanus Vaccine  02/01/2026  .  Hepatitis C: One time screening is recommended by Center for Disease Control  (CDC) for  adults born from [birth year] through 1967.   Completed  . HIV Screening  Completed

## 2019-09-06 NOTE — Progress Notes (Signed)
Called pt- scheduled appt with patient for flu and pneumonia injections//tes

## 2019-09-11 ENCOUNTER — Other Ambulatory Visit: Payer: Self-pay

## 2019-09-11 ENCOUNTER — Ambulatory Visit (INDEPENDENT_AMBULATORY_CARE_PROVIDER_SITE_OTHER): Payer: Medicare Other | Admitting: *Deleted

## 2019-09-11 DIAGNOSIS — Z23 Encounter for immunization: Secondary | ICD-10-CM | POA: Diagnosis not present

## 2019-09-11 NOTE — Progress Notes (Signed)
Per orders of Dr.Nafziger , injection of Influenza Vaccine and PCV 13 given by Zacarias Pontes. Patient tolerated injection well.

## 2019-10-02 ENCOUNTER — Other Ambulatory Visit: Payer: Self-pay | Admitting: Adult Health

## 2019-10-02 DIAGNOSIS — I10 Essential (primary) hypertension: Secondary | ICD-10-CM

## 2019-10-02 DIAGNOSIS — E1169 Type 2 diabetes mellitus with other specified complication: Secondary | ICD-10-CM

## 2019-10-03 ENCOUNTER — Other Ambulatory Visit: Payer: Self-pay | Admitting: Family Medicine

## 2019-10-03 DIAGNOSIS — E1169 Type 2 diabetes mellitus with other specified complication: Secondary | ICD-10-CM

## 2019-10-03 NOTE — Telephone Encounter (Signed)
Sent to the pharmacy by e-scribe for 90 days.  Pt scheduled for A1C and follow up.

## 2019-10-09 ENCOUNTER — Other Ambulatory Visit (INDEPENDENT_AMBULATORY_CARE_PROVIDER_SITE_OTHER): Payer: Medicare Other

## 2019-10-09 ENCOUNTER — Other Ambulatory Visit: Payer: Self-pay

## 2019-10-09 DIAGNOSIS — E1169 Type 2 diabetes mellitus with other specified complication: Secondary | ICD-10-CM | POA: Diagnosis not present

## 2019-10-09 LAB — HEMOGLOBIN A1C: Hgb A1c MFr Bld: 8 % — ABNORMAL HIGH (ref 4.6–6.5)

## 2019-10-10 ENCOUNTER — Encounter: Payer: Self-pay | Admitting: Adult Health

## 2019-10-10 ENCOUNTER — Ambulatory Visit (INDEPENDENT_AMBULATORY_CARE_PROVIDER_SITE_OTHER): Payer: Medicare Other | Admitting: Adult Health

## 2019-10-10 DIAGNOSIS — E1169 Type 2 diabetes mellitus with other specified complication: Secondary | ICD-10-CM | POA: Diagnosis not present

## 2019-10-10 MED ORDER — GLUCOSE BLOOD VI STRP: ORAL_STRIP | 12 refills | Status: DC

## 2019-10-10 MED ORDER — JARDIANCE 25 MG PO TABS: 25.0000 mg | ORAL_TABLET | Freq: Every day | ORAL | 0 refills | Status: DC

## 2019-10-10 MED ORDER — ONETOUCH ULTRASOFT LANCETS MISC: 12 refills | Status: DC

## 2019-10-10 NOTE — Progress Notes (Signed)
Virtual Visit via Telephone Note  I connected with Jason [name] on 10/10/19 at 10:30 AM EDT by telephone and verified that I am speaking with the correct person using two identifiers.   I discussed the limitations, risks, security and privacy concerns of performing an evaluation and management service by telephone and the availability of in person appointments. I also discussed with the patient that there may be a patient responsible charge related to this service. The patient expressed understanding and agreed to proceed.  Location patient: home Location provider: work or home office Participants present for the call: patient, provider Patient did not have a visit in the prior 7 days to address this/these issue(s).   History of Present Illness: 63 year old male who is being evaluated today for 8-month follow-up regarding diabetes.  He is currently maintained on Jardiance 25 mg daily, Metformin 1000 mg twice daily, and glipizide 10 mg.  He has been monitoring his blood sugars at home and reports readings consistently around 140.  He has been exercising and eating healthier, has cut back on sugars and pork as well as carbs.  His A1c in January 2020 was 14 since that time he has been slowly improving, today his A1c is 8.0.   Observations/Objective: Patient sounds cheerful and well on the phone. I do not appreciate any SOB. Speech and thought processing are grossly intact. Patient reported vitals:  Assessment and Plan: 1. Type 2 diabetes mellitus with other specified complication, without long-term current use of insulin ([place]) -He was congratulated on lifestyle modifications to help bring his blood sugars down.  He was encouraged to continue to and be mindful throughout the holiday season.  We will keep him on the same doses of medications for the time being.  He will follow-up in 3 months for his CPE - empagliflozin (JARDIANCE) 25 MG TABS tablet; Take 25 mg by mouth daily.  Dispense: 90  tablet; Refill: 0 - Lancets (ONETOUCH ULTRASOFT) lancets; Use as instructed  Dispense: 100 each; Refill: 12 - glucose blood (ONE TOUCH ULTRA TEST) test strip; Use as instructed  Dispense: 100 each; Refill: 12   Follow Up Instructions:  I did not refer this patient for an OV in the next 24 hours for this/these issue(s).  I discussed the assessment and treatment plan with the patient. The patient was provided an opportunity to ask questions and all were answered. The patient agreed with the plan and demonstrated an understanding of the instructions.   The patient was advised to call back or seek an in-person evaluation if the symptoms worsen or if the condition fails to improve as anticipated.  I provided 15  minutes of non-face-to-face time during this encounter.   [name], NP

## 2019-11-04 ENCOUNTER — Other Ambulatory Visit: Payer: Self-pay | Admitting: Family Medicine

## 2019-11-04 DIAGNOSIS — E1169 Type 2 diabetes mellitus with other specified complication: Secondary | ICD-10-CM

## 2019-11-04 MED ORDER — GLUCOSE BLOOD VI STRP: ORAL_STRIP | 3 refills | Status: DC

## 2019-11-04 NOTE — Telephone Encounter (Signed)
SENT TO THE PHARMACY BY E-SCRIBE WITH APPROPRIATE DIRECTIONS.

## 2019-12-27 ENCOUNTER — Other Ambulatory Visit: Payer: Medicare Other

## 2020-01-05 ENCOUNTER — Other Ambulatory Visit: Payer: Self-pay | Admitting: Adult Health

## 2020-01-05 DIAGNOSIS — I10 Essential (primary) hypertension: Secondary | ICD-10-CM

## 2020-01-05 DIAGNOSIS — E1169 Type 2 diabetes mellitus with other specified complication: Secondary | ICD-10-CM

## 2020-01-24 ENCOUNTER — Other Ambulatory Visit: Payer: Self-pay

## 2020-01-24 ENCOUNTER — Telehealth: Payer: Self-pay | Admitting: Adult Health

## 2020-01-24 DIAGNOSIS — E1169 Type 2 diabetes mellitus with other specified complication: Secondary | ICD-10-CM

## 2020-01-24 MED ORDER — JARDIANCE 25 MG PO TABS: 25.0000 mg | ORAL_TABLET | Freq: Every day | ORAL | 0 refills | Status: DC

## 2020-01-24 NOTE — Telephone Encounter (Signed)
Medication Refill: [place]: [facility] [id number]   Pt has been out of this medication for a while now. Pt would like a call back at [phone]

## 2020-01-24 NOTE — Telephone Encounter (Signed)
Rx sent to pharmacy   

## 2020-01-29 ENCOUNTER — Other Ambulatory Visit: Payer: Self-pay

## 2020-01-29 DIAGNOSIS — E1169 Type 2 diabetes mellitus with other specified complication: Secondary | ICD-10-CM

## 2020-01-29 MED ORDER — ONETOUCH ULTRASOFT LANCETS MISC: 12 refills | Status: DC

## 2020-03-04 ENCOUNTER — Telehealth: Payer: Self-pay | Admitting: Adult Health

## 2020-03-04 NOTE — Telephone Encounter (Signed)
[employer] from [employer] is requesting re evaluating therapy for the pt and place on statin therapy.   [employer]: [phone]

## 2020-03-04 NOTE — Telephone Encounter (Signed)
Per Tommi Rumps, this message is not needed.  Okay to close.  Nothing further needed.

## 2020-04-16 ENCOUNTER — Telehealth: Payer: Self-pay | Admitting: Adult Health

## 2020-04-16 NOTE — Chronic Care Management (AMB) (Signed)
°  Chronic Care Management   Note  04/16/2020 Name: KARDEN SCHNEEKLOTH MRN: [id number] DOB: Dec 06, 1956  SERAPIO GELINAS is a 64 y.o. year old male who is a primary care patient of [name], NP. I reached out to Archie Endo by phone today in response to a referral sent by Mr. Tana Conch Laguardia's PCP, [name], NP.   Mr. Alpers was given information about Chronic Care Management services today including:  1. CCM service includes personalized support from designated clinical staff supervised by his physician, including individualized plan of care and coordination with other care providers 2. 24/7 contact phone numbers for assistance for urgent and routine care needs. 3. Service will only be billed when office clinical staff spend 20 minutes or more in a month to coordinate care. 4. Only one practitioner may furnish and bill the service in a calendar month. 5. The patient may stop CCM services at any time (effective at the end of the month) by phone call to the office staff.   Patient agreed to services and verbal consent obtained.   Follow up plan:   [place]

## 2020-04-17 ENCOUNTER — Other Ambulatory Visit: Payer: Self-pay | Admitting: Adult Health

## 2020-04-17 DIAGNOSIS — E1169 Type 2 diabetes mellitus with other specified complication: Secondary | ICD-10-CM

## 2020-04-17 NOTE — Telephone Encounter (Signed)
DENIED.  DUE FOR DIABETES FOLLOW UP.

## 2020-04-23 ENCOUNTER — Encounter: Payer: Self-pay | Admitting: Pharmacist

## 2020-05-18 ENCOUNTER — Ambulatory Visit: Payer: Medicare Other

## 2020-05-18 ENCOUNTER — Other Ambulatory Visit: Payer: Self-pay

## 2020-05-18 DIAGNOSIS — E119 Type 2 diabetes mellitus without complications: Secondary | ICD-10-CM

## 2020-05-18 DIAGNOSIS — I10 Essential (primary) hypertension: Secondary | ICD-10-CM

## 2020-05-18 NOTE — Patient Instructions (Addendum)
Visit Information  Goals Addressed            This Visit's Progress   . Pharmacy Care Plan       CARE PLAN ENTRY  Current Barriers:  . Chronic Disease Management support, education, and care coordination needs related to Hypertension and Diabetes   Hypertension . Pharmacist Clinical Goal(s): o Over the next 90 days, patient will work with PharmD and providers to maintain BP goal <130/80 . Current regimen:  o Diltiazem (Cardizem CD) 120mg , 1 capsule once daily  . Interventions: . Discussed diet modifications. DASH diet:  following a diet emphasizing fruits and vegetables and low-fat dairy products along with whole grains, fish, poultry, and nuts. Reducing red meats and sugars.  . Patient self care activities - Over the next 90 days, patient will: o Check BP as instructed, document, and provide at future appointments o Ensure daily salt intake < 2300 mg/day  Diabetes . Pharmacist Clinical Goal(s): o Over the next 90 days, patient will work with PharmD and providers to achieve A1c goal <7% . Current regimen:   empagliflozin (Jardiance) 25mg , 1 tablet once daily   Glipizide (Glucotrol XL) 10mg , 1 tablet once daily with breakfast  Metformin 1000mg , 1 tablet once daily with a meal . Patient self care activities - Over the next 90 days, patient will: o Check blood sugar as directed, document, and provide at future appointments o Contact provider with any episodes of hypoglycemia  Medication management . Pharmacist Clinical Goal(s): o Over the next 90 days, patient will work with PharmD and providers to maintain optimal medication adherence . [facility] . Interventions o Comprehensive medication review performed. o Continue current medication management strategy . Patient self care activities - Over the next 90 days, patient will: o Take medications as prescribed o Report any questions or concerns to PharmD and/or provider(s)  Initial goal documentation         Jason [name] was given information about Chronic Care Management services today including:  1. CCM service includes personalized support from designated clinical staff supervised by his physician, including individualized plan of care and coordination with other care providers 2. 24/7 contact phone numbers for assistance for urgent and routine care needs. 3. Standard insurance, coinsurance, copays and deductibles apply for chronic care management only during months in which we provide at least 20 minutes of these services. Most insurances cover these services at 100%, however patients may be responsible for any copay, coinsurance and/or deductible if applicable. This service may help you avoid the need for more expensive face-to-face services. 4. Only one practitioner may furnish and bill the service in a calendar month. 5. The patient may stop CCM services at any time (effective at the end of the month) by phone call to the office staff.  Patient agreed to services and verbal consent obtained.   The patient verbalized understanding of instructions provided today and agreed to receive a mailed copy of patient instruction and/or educational materials. Telephone follow up appointment with pharmacy team member scheduled for:  08/20/2020  Anson Crofts, PharmD Clinical Pharmacist [facility] [place] [phone]   Diabetes Mellitus and Nutrition, Adult When you have diabetes (diabetes mellitus), it is very important to have healthy eating habits because your blood sugar (glucose) levels are greatly affected by what you eat and drink. Eating healthy foods in the appropriate amounts, at about the same times every day, can help you:  Control your blood glucose.  Lower your risk  of heart disease.  Improve your blood pressure.  Reach or maintain a healthy weight. Every person with diabetes is different, and each person has different needs for a meal plan. Your health  care provider may recommend that you work with a diet and nutrition specialist (dietitian) to make a meal plan that is best for you. Your meal plan may vary depending on factors such as:  The calories you need.  The medicines you take.  Your weight.  Your blood glucose, blood pressure, and cholesterol levels.  Your activity level.  Other health conditions you have, such as heart or kidney disease. How do carbohydrates affect me? Carbohydrates, also called carbs, affect your blood glucose level more than any other type of food. Eating carbs naturally raises the amount of glucose in your blood. Carb counting is a method for keeping track of how many carbs you eat. Counting carbs is important to keep your blood glucose at a healthy level, especially if you use insulin or take certain oral diabetes medicines. It is important to know how many carbs you can safely have in each meal. This is different for every person. Your dietitian can help you calculate how many carbs you should have at each meal and for each snack. Foods that contain carbs include:  Bread, cereal, rice, pasta, and crackers.  Potatoes and corn.  Peas, beans, and lentils.  Milk and yogurt.  Fruit and juice.  Desserts, such as cakes, cookies, ice cream, and candy. How does alcohol affect me? Alcohol can cause a sudden decrease in blood glucose (hypoglycemia), especially if you use insulin or take certain oral diabetes medicines. Hypoglycemia can be a life-threatening condition. Symptoms of hypoglycemia (sleepiness, dizziness, and confusion) are similar to symptoms of having too much alcohol. If your health care provider says that alcohol is safe for you, follow these guidelines:  Limit alcohol intake to no more than 1 drink per day for nonpregnant women and 2 drinks per day for men. One drink equals 12 oz of beer, 5 oz of wine, or 1 oz of hard liquor.  Do not drink on an empty stomach.  Keep yourself hydrated with  water, diet soda, or unsweetened iced tea.  Keep in mind that regular soda, juice, and other mixers may contain a lot of sugar and must be counted as carbs. What are tips for following this plan?  Reading food labels  Start by checking the serving size on the "Nutrition Facts" label of packaged foods and drinks. The amount of calories, carbs, fats, and other nutrients listed on the label is based on one serving of the item. Many items contain more than one serving per package.  Check the total grams (g) of carbs in one serving. You can calculate the number of servings of carbs in one serving by dividing the total carbs by 15. For example, if a food has 30 g of total carbs, it would be equal to 2 servings of carbs.  Check the number of grams (g) of saturated and trans fats in one serving. Choose foods that have low or no amount of these fats.  Check the number of milligrams (mg) of salt (sodium) in one serving. Most people should limit total sodium intake to less than 2,300 mg per day.  Always check the nutrition information of foods labeled as "low-fat" or "nonfat". These foods may be higher in added sugar or refined carbs and should be avoided.  Talk to your dietitian to identify your daily goals  for nutrients listed on the label. Shopping  Avoid buying canned, premade, or processed foods. These foods tend to be high in fat, sodium, and added sugar.  Shop around the outside edge of the grocery store. This includes fresh fruits and vegetables, bulk grains, fresh meats, and fresh dairy. Cooking  Use low-heat cooking methods, such as baking, instead of high-heat cooking methods like deep frying.  Cook using healthy oils, such as olive, canola, or sunflower oil.  Avoid cooking with butter, cream, or high-fat meats. Meal planning  Eat meals and snacks regularly, preferably at the same times every day. Avoid going long periods of time without eating.  Eat foods high in fiber, such as  fresh fruits, vegetables, beans, and whole grains. Talk to your dietitian about how many servings of carbs you can eat at each meal.  Eat 4-6 ounces (oz) of lean protein each day, such as lean meat, chicken, fish, eggs, or tofu. One oz of lean protein is equal to: ? 1 oz of meat, chicken, or fish. ? 1 egg. ?  cup of tofu.  Eat some foods each day that contain healthy fats, such as avocado, nuts, seeds, and fish. Lifestyle  Check your blood glucose regularly.  Exercise regularly as told by your health care provider. This may include: ? 150 minutes of moderate-intensity or vigorous-intensity exercise each week. This could be brisk walking, biking, or water aerobics. ? Stretching and doing strength exercises, such as yoga or weightlifting, at least 2 times a week.  Take medicines as told by your health care provider.  Do not use any products that contain nicotine or tobacco, such as cigarettes and e-cigarettes. If you need help quitting, ask your health care provider.  Work with a Social worker or diabetes educator to identify strategies to manage stress and any emotional and social challenges. Questions to ask a health care provider  Do I need to meet with a diabetes educator?  Do I need to meet with a dietitian?  What number can I call if I have questions?  When are the best times to check my blood glucose? Where to find more information:  American Diabetes Association: diabetes.org  Academy of Nutrition and Dietetics: www.eatright.[employer] of Diabetes and Digestive and Kidney Diseases (NIH): [employer] Summary  A healthy meal plan will help you control your blood glucose and maintain a healthy lifestyle.  Working with a diet and nutrition specialist (dietitian) can help you make a meal plan that is best for you.  Keep in mind that carbohydrates (carbs) and alcohol have immediate effects on your blood glucose levels. It is important to count carbs and to  use alcohol carefully. This information is not intended to replace advice given to you by your health care provider. Make sure you discuss any questions you have with your health care provider. Document Revised: 11/10/2017 Document Reviewed: 01/02/2017 Elsevier Patient Education  2020 [employer].

## 2020-05-18 NOTE — Chronic Care Management (AMB) (Signed)
Chronic Care Management Pharmacy  Name: Jason [name]  MRN: [id number] DOB: 12-02-1956  Initial Questions: 1. Have you seen any other providers since your last visit? NA 2. Any changes in your medicines or health? No   Chief Complaint/ HPI  Jason [name],  64 y.o. , male presents for their Initial CCM visit with the clinical pharmacist via telephone due to COVID-19 Pandemic.  Patient reports recent loss of spouse in January and is just now focusing again on his health.   PCP : [name], NP  Their chronic conditions include: DM, HTN  Office Visits:   10/10/2019- [name], NP- Patient presented for virtual visit for 3 month follow up for DM. Patient reported home BGs: 140s. Today's A1c: 8.0. Previously: 14% (12/2018). Patient to continue same doses of medication. Patient to follow up in 3 months for CPE.   Medications: Outpatient Encounter Medications as of 05/18/2020  Medication Sig  . Blood Glucose Monitoring Suppl (ONE TOUCH ULTRA 2) w/Device KIT Use the device daily to check your blood sugars 1-4 times as instructed.  . diltiazem (CARDIZEM CD) 120 MG 24 hr capsule TAKE 1 CAPSULE BY MOUTH EVERY DAY  . empagliflozin (JARDIANCE) 25 MG TABS tablet Take 25 mg by mouth daily.  Marland Kitchen glipiZIDE (GLUCOTROL XL) 10 MG 24 hr tablet TAKE 1 TABLET BY MOUTH DAILY WITH BREAKFAST  . glucose blood (ONE TOUCH ULTRA TEST) test strip USE TO TEST BLOOD GLUCOSE TWICE DAILY  . Lancets (ONETOUCH ULTRASOFT) lancets Use to check blood sugar BID  . metFORMIN (GLUCOPHAGE) 1000 MG tablet Take 1 tablet (1,000 mg total) by mouth 2 (two) times daily with a meal. (Patient taking differently: Take 1,000 mg by mouth daily. )   No facility-administered encounter medications on file as of 05/18/2020.    Current Diagnosis/Assessment:  Goals Addressed            This Visit's Progress   . Pharmacy Care Plan       CARE PLAN ENTRY  Current Barriers:  . Chronic Disease Management support,  education, and care coordination needs related to Hypertension and Diabetes   Hypertension . Pharmacist Clinical Goal(s): o Over the next 90 days, patient will work with PharmD and providers to maintain BP goal <130/80 . Current regimen:  o Diltiazem (Cardizem CD) 196m, 1 capsule once daily  . Interventions: . Discussed diet modifications. DASH diet:  following a diet emphasizing fruits and vegetables and low-fat dairy products along with whole grains, fish, poultry, and nuts. Reducing red meats and sugars.  . Patient self care activities - Over the next 90 days, patient will: o Check BP as instructed, document, and provide at future appointments o Ensure daily salt intake < 2300 mg/day  Diabetes . Pharmacist Clinical Goal(s): o Over the next 90 days, patient will work with PharmD and providers to achieve A1c goal <7% . Current regimen:   empagliflozin (Jardiance) 238m 1 tablet once daily   Glipizide (Glucotrol XL) 1014m1 tablet once daily with breakfast  Metformin 1000m15m tablet once daily with a meal . Patient self care activities - Over the next 90 days, patient will: o Check blood sugar as directed, document, and provide at future appointments o Contact provider with any episodes of hypoglycemia  Medication management . Pharmacist Clinical Goal(s): o Over the next 90 days, patient will work with PharmD and providers to maintain optimal medication adherence . [facility] . Interventions o Comprehensive medication review performed. o Continue current medication management  strategy . Patient self care activities - Over the next 90 days, patient will: o Take medications as prescribed o Report any questions or concerns to PharmD and/or provider(s)  Initial goal documentation        Diabetes  Patient reports misplacing BG monitor since Jan 09, 2023.   Recent Relevant Labs: Lab Results  Component Value Date/Time   HGBA1C 8.0 (H) 10/09/2019 07:20 AM   HGBA1C  9.3 (H) 07/09/2019 11:36 AM   HGBA1C 9.5 (A) 01/10/2019 08:30 AM   MICROALBUR 3.1 (H) 10/10/2018 08:30 AM   MICROALBUR 7.0 (H) 02/02/2016 08:48 AM     Checking BG: currently not checking - misplaced monitor in January 09, 2023.   Recent BG Readings: NA  Patient has failed these meds in past: none   Patient is currently uncontrolled on the following medications:   empagliflozin (Jardiance) 45m, 1 tablet once daily   Glipizide (Glucotrol XL) 123m 1 tablet once daily with breakfast  Metformin 100076m1 tablet twice daily with a meal (patient reports taking once daily- states under agreeance with CorTommi Rumpsowever no chart documentation found).   Last diabetic Eye exam: No results found for: HMDIABEYEEXA  - patient reports obtaining yearly eye exams and has plans to obtain this Fall.   Last diabetic Foot exam: No results found for: HMDIABFOOTEX  - patient reports some tingling. But reports improved than before.   We discussed: diet and exercise extensively - diet: patient reports diet is back to normal. Spouse passed away in JanJan 29, 2024d patient is now getting back on track. Patient reports continuing to lose weight.  - exercise: patient reports walking with grandson and does activities together (basketball).   [facility]  Continue current medications   Hypertension  Denies dizziness/ lightheadedness.  Denies persistent HA/ chest pain   Office blood pressures are  BP Readings from Last 3 Encounters:  05/30/19 138/84  01/10/19 124/86  10/10/18 (!) 148/90   Patient has failed these meds in the past: none   Patient checks BP at home not checking  - patient reports not checking at home. [facility] reported [facility] and checked his BP stating WNL.   Patient is controlled on:   Diltiazem (Cardizem CD) 120m47m capsule once daily   We discussed:  . Discussed diet modifications. DASH diet:  following a diet emphasizing fruits  and vegetables and low-fat dairy products along with whole grains, fish, poultry, and nuts. Reducing red meats and sugars.   [facility].  Continue current medications   Medication Management  Patient organizes medications: patient reports routine; and taking all meds once a day in the morning.   [facility]  Adherence:  - metformin 1000mg77mst filled 01/24/20 for 90DS)  - no other gaps in refill history per medication dispense report from 11/20/19 to 05/18/20).  --- only taking once daily- has discussed with Cory.Tommi RumpsFollow up Follow up visit with PharmD in 3 months.   AnnetAnson CroftsrmD Clinical Pharmacist [facility])[phone]

## 2020-05-19 ENCOUNTER — Other Ambulatory Visit: Payer: Self-pay | Admitting: Adult Health

## 2020-05-19 DIAGNOSIS — I10 Essential (primary) hypertension: Secondary | ICD-10-CM

## 2020-05-19 DIAGNOSIS — E119 Type 2 diabetes mellitus without complications: Secondary | ICD-10-CM

## 2020-05-19 DIAGNOSIS — E1169 Type 2 diabetes mellitus with other specified complication: Secondary | ICD-10-CM

## 2020-05-19 MED ORDER — ONETOUCH ULTRA 2 W/DEVICE KIT: PACK | 0 refills | Status: AC

## 2020-05-28 ENCOUNTER — Other Ambulatory Visit: Payer: Self-pay | Admitting: Family Medicine

## 2020-05-28 ENCOUNTER — Encounter: Payer: Self-pay | Admitting: Pharmacist

## 2020-05-28 ENCOUNTER — Other Ambulatory Visit: Payer: Self-pay

## 2020-05-28 ENCOUNTER — Ambulatory Visit (INDEPENDENT_AMBULATORY_CARE_PROVIDER_SITE_OTHER): Payer: Medicare Other | Admitting: Adult Health

## 2020-05-28 ENCOUNTER — Encounter: Payer: Self-pay | Admitting: Adult Health

## 2020-05-28 VITALS — BP 160/94 | Temp 97.8°F | Ht 74.0 in | Wt 246.0 lb

## 2020-05-28 DIAGNOSIS — E782 Mixed hyperlipidemia: Secondary | ICD-10-CM

## 2020-05-28 DIAGNOSIS — Z125 Encounter for screening for malignant neoplasm of prostate: Secondary | ICD-10-CM | POA: Diagnosis not present

## 2020-05-28 DIAGNOSIS — Z0000 Encounter for general adult medical examination without abnormal findings: Secondary | ICD-10-CM

## 2020-05-28 DIAGNOSIS — E119 Type 2 diabetes mellitus without complications: Secondary | ICD-10-CM

## 2020-05-28 DIAGNOSIS — I10 Essential (primary) hypertension: Secondary | ICD-10-CM

## 2020-05-28 LAB — CBC WITH DIFFERENTIAL/PLATELET
Basophils Absolute: 0 10*3/uL (ref 0.0–0.1)
Basophils Relative: 0.6 % (ref 0.0–3.0)
Eosinophils Absolute: 0.2 10*3/uL (ref 0.0–0.7)
Eosinophils Relative: 3.8 % (ref 0.0–5.0)
HCT: 46.9 % (ref 39.0–52.0)
Hemoglobin: 15.8 g/dL (ref 13.0–17.0)
Lymphocytes Relative: 46.3 % — ABNORMAL HIGH (ref 12.0–46.0)
Lymphs Abs: 3 10*3/uL (ref 0.7–4.0)
MCHC: 33.7 g/dL (ref 30.0–36.0)
MCV: 97.9 fl (ref 78.0–100.0)
Monocytes Absolute: 0.5 10*3/uL (ref 0.1–1.0)
Monocytes Relative: 7.9 % (ref 3.0–12.0)
Neutro Abs: 2.7 10*3/uL (ref 1.4–7.7)
Neutrophils Relative %: 41.4 % — ABNORMAL LOW (ref 43.0–77.0)
Platelets: 223 10*3/uL (ref 150.0–400.0)
RBC: 4.79 Mil/uL (ref 4.22–5.81)
RDW: 13.3 % (ref 11.5–15.5)
WBC: 6.5 10*3/uL (ref 4.0–10.5)

## 2020-05-28 LAB — LIPID PANEL
Cholesterol: 156 mg/dL (ref 0–200)
HDL: 62.1 mg/dL (ref >39.00)
LDL Cholesterol: 79 mg/dL (ref 0–99)
NonHDL: 93.69
Total CHOL/HDL Ratio: 3
Triglycerides: 74 mg/dL (ref 0.0–149.0)
VLDL: 14.8 mg/dL (ref 0.0–40.0)

## 2020-05-28 LAB — COMPREHENSIVE METABOLIC PANEL
ALT: 31 U/L (ref 0–53)
AST: 21 U/L (ref 0–37)
Albumin: 4.1 g/dL (ref 3.5–5.2)
Alkaline Phosphatase: 73 U/L (ref 39–117)
BUN: 19 mg/dL (ref 6–23)
CO2: 28 mEq/L (ref 19–32)
Calcium: 10 mg/dL (ref 8.4–10.5)
Chloride: 103 mEq/L (ref 96–112)
Creatinine, Ser: 1.3 mg/dL (ref 0.40–1.50)
GFR: 66.81 mL/min (ref >60.00)
Glucose, Bld: 110 mg/dL — ABNORMAL HIGH (ref 70–99)
Potassium: 4.8 mEq/L (ref 3.5–5.1)
Sodium: 137 mEq/L (ref 135–145)
Total Bilirubin: 0.3 mg/dL (ref 0.2–1.2)
Total Protein: 7.8 g/dL (ref 6.0–8.3)

## 2020-05-28 LAB — HEMOGLOBIN A1C: Hgb A1c MFr Bld: 7.8 % — ABNORMAL HIGH (ref 4.6–6.5)

## 2020-05-28 LAB — TSH: TSH: 2.96 u[IU]/mL (ref 0.35–4.50)

## 2020-05-28 LAB — PSA: PSA: 1.4 ng/mL (ref 0.10–4.00)

## 2020-05-28 MED ORDER — ROSUVASTATIN CALCIUM 5 MG PO TABS
5.0000 mg | ORAL_TABLET | Freq: Every day | ORAL | 3 refills | Status: DC
Start: 2020-05-28 — End: 2021-06-17

## 2020-05-28 NOTE — Patient Instructions (Signed)
It was great seeing you today   Please continue to work on yourself.   We will follow up with you regarding your blood work   I would like to see you back in 3 months

## 2020-05-28 NOTE — Progress Notes (Signed)
Subjective:    Patient ID: Jason [name], male    DOB: [birth year], 64 y.o.   MRN: [id number]  HPI Patient presents for yearly preventative medicine examination. He is a pleasant 64 year old male who  has a past medical history of Bell palsy, Diabetes mellitus without complication ([place]), HTN (hypertension), and Hyperglycemia.   His wife died a few months ago and he reports " I got off track with everything when she passed but I am back to taking all my medications daily and working on my exercise and diet"   DM-currently prescribed Metformin 1000 mg  daily, glipizide 10 mg extended release daily, and Jardiance 25 mg daily. He has not been checking his blood sugars because he lost his monitor. He has not been to the pharmacy to pick this up yet.   Lab Results  Component Value Date   HGBA1C 8.0 (H) 10/09/2019    HTN-prescribed Cardizem 180 mg.  He denies dizziness, lightheadedness, chest pain, or shortness of breath. BP elevated today but he did not take his medications this morning. He does not monitor his BP at home.  BP Readings from Last 3 Encounters:  05/28/20 (!) 160/94  05/30/19 138/84  01/10/19 124/86   Hyperlipidemia - is not currently on any medications.Marland Kitchen He is willing to go on a statin  All immunizations and health maintenance protocols were reviewed with the patient and needed orders were placed. He is up to date on vaccinations   Appropriate screening laboratory values were ordered for the patient including screening of hyperlipidemia, renal function and hepatic function. If indicated by BPH, a PSA was ordered.  Medication reconciliation,  past medical history, social history, problem list and allergies were reviewed in detail with the patient  Goals were established with regard to weight loss, exercise, and  diet in compliance with medications. He is walking about three days a week and walks a mile  Wt Readings from Last 3 Encounters:  05/28/20 246 lb (111.6 kg)   09/05/19 247 lb (112 kg)  05/30/19 245 lb (111.1 kg)   End of life planning was discussed.  He is up-to-date on routine colon cancer screening.  He has a colonoscopy every 3 years  He has no acute issues.   Review of Systems  Constitutional: Negative.   HENT: Negative.   Eyes: Negative.   Respiratory: Negative.   Cardiovascular: Negative.   Gastrointestinal: Negative.   Endocrine: Negative.   Genitourinary: Negative.   Musculoskeletal: Negative.   Skin: Negative.   Allergic/Immunologic: Negative.   Neurological: Negative.   Hematological: Negative.   Psychiatric/Behavioral: Negative.   All other systems reviewed and are negative.  Past Medical History:  Diagnosis Date  . Bell palsy   . Diabetes mellitus without complication ([place])   . HTN (hypertension)   . Hyperglycemia     Social History   Socioeconomic History  . Marital status: Married    Spouse name: Not on file  . Number of children: 2  . Years of education: 47  . Highest education level: Not on file  Occupational History  . Occupation: [id number]    Employer: [employer]  . Occupation: Truck Geophysicist/field seismologist  Tobacco Use  . Smoking status: Never Smoker  . Smokeless tobacco: Never Used  Vaping Use  . Vaping Use: Never used  Substance and Sexual Activity  . Alcohol use: Yes    Alcohol/week: 6.0 standard drinks    Types: 6 Cans of beer per week    Comment:  on weekends  . Drug use: No  . Sexual activity: Not on file  Other Topics Concern  . 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       Fun: fix cars, drag race    Social Determinants of Health   Financial Resource Strain: Low Risk   . Difficulty of Paying Living Expenses: Not hard at all  Food Insecurity:   . Worried About Charity fundraiser in the Last Year:   . Arboriculturist in the Last Year:   Transportation Needs: No Transportation Needs  . Lack of  Transportation (Medical): No  . Lack of Transportation (Non-Medical): No  Physical Activity:   . Days of Exercise per Week:   . Minutes of Exercise per Session:   Stress:   . Feeling of Stress :   Social Connections:   . Frequency of Communication with Friends and Family:   . Frequency of Social Gatherings with Friends and Family:   . Attends Religious Services:   . Active Member of Clubs or Organizations:   . Attends Archivist Meetings:   Marland Kitchen Marital Status:   Intimate Partner Violence:   . Fear of Current or Ex-Partner:   . Emotionally Abused:   Marland Kitchen Physically Abused:   . Sexually Abused:     Past Surgical History:  Procedure Laterality Date  . bellpalsy    . COLONOSCOPY    . COLONOSCOPY W/ POLYPECTOMY      Family History  Problem Relation Age of Onset  . Stroke Mother   . Colon cancer Brother   . High blood pressure Brother   . Stomach cancer Brother   . Colon cancer Other        1st degree relative <60  . Diabetes Other        1st degree relative   . Stroke Other   . Colon cancer Sister   . High blood pressure Sister   . Esophageal cancer Neg Hx   . Rectal cancer Neg Hx     Allergies  Allergen Reactions  . Lisinopril     hyperkalemia    Current Outpatient Medications on File Prior to Visit  Medication Sig Dispense Refill  . Blood Glucose Monitoring Suppl (ONE TOUCH ULTRA 2) w/Device KIT Use the device daily to check your blood sugars 1-4 times as instructed. 1 kit 0  . diltiazem (CARDIZEM CD) 120 MG 24 hr capsule TAKE 1 CAPSULE BY MOUTH EVERY DAY 90 capsule 0  . empagliflozin (JARDIANCE) 25 MG TABS tablet Take 25 mg by mouth daily. 90 tablet 0  . glipiZIDE (GLUCOTROL XL) 10 MG 24 hr tablet TAKE 1 TABLET BY MOUTH DAILY WITH BREAKFAST 90 tablet 0  . glucose blood (ONE TOUCH ULTRA TEST) test strip USE TO TEST BLOOD GLUCOSE TWICE DAILY 200 each 3  . Lancets (ONETOUCH ULTRASOFT) lancets Use to check blood sugar BID 100 each 12  . metFORMIN (GLUCOPHAGE)  1000 MG tablet Take 1 tablet (1,000 mg total) by mouth 2 (two) times daily with a meal. (Patient taking differently: Take 1,000 mg by mouth daily. ) 180 tablet 0   No current facility-administered medications on file prior to visit.    BP (!) 160/94   Temp 97.8 F (36.6 C)   Ht '6\' 2"'  (1.88 m)   Wt 246 lb (111.6 kg)  BMI 31.58 kg/m       Objective:   Physical Exam Vitals and nursing note reviewed.  Constitutional:      General: He is not in acute distress.    Appearance: Normal appearance. He is well-developed. He is obese.  HENT:     Head: Normocephalic and atraumatic.     Right Ear: Tympanic membrane, ear canal and external ear normal. There is no impacted cerumen.     Left Ear: Tympanic membrane, ear canal and external ear normal. There is no impacted cerumen.     Nose: Nose normal. No congestion or rhinorrhea.     Mouth/Throat:     Mouth: Mucous membranes are moist.     Pharynx: Oropharynx is clear. No oropharyngeal exudate or posterior oropharyngeal erythema.  Eyes:     General:        Right eye: No discharge.        Left eye: No discharge.     Extraocular Movements: Extraocular movements intact.     Conjunctiva/sclera: Conjunctivae normal.     Pupils: Pupils are equal, round, and reactive to light.  Neck:     Vascular: No carotid bruit.     Trachea: No tracheal deviation.  Cardiovascular:     Rate and Rhythm: Normal rate and regular rhythm.     Pulses: Normal pulses.     Heart sounds: Normal heart sounds. No murmur heard.  No friction rub. No gallop.   Pulmonary:     Effort: Pulmonary effort is normal. No respiratory distress.     Breath sounds: Normal breath sounds. No stridor. No wheezing, rhonchi or rales.  Chest:     Chest wall: No tenderness.  Abdominal:     General: Bowel sounds are normal. There is no distension.     Palpations: Abdomen is soft. There is no mass.     Tenderness: There is no abdominal tenderness. There is no right CVA tenderness, left  CVA tenderness, guarding or rebound.     Hernia: No hernia is present.  Musculoskeletal:        General: No swelling, tenderness, deformity or signs of injury. Normal range of motion.     Right lower leg: No edema.     Left lower leg: No edema.  Lymphadenopathy:     Cervical: No cervical adenopathy.  Skin:    General: Skin is warm and dry.     Capillary Refill: Capillary refill takes less than 2 seconds.     Coloration: Skin is not jaundiced or pale.     Findings: No bruising, erythema, lesion or rash.  Neurological:     General: No focal deficit present.     Mental Status: He is alert and oriented to person, place, and time.     Cranial Nerves: No cranial nerve deficit.     Sensory: No sensory deficit.     Motor: No weakness.     Coordination: Coordination normal.     Gait: Gait normal.     Deep Tendon Reflexes: Reflexes normal.  Psychiatric:        Mood and Affect: Mood normal.        Behavior: Behavior normal.        Thought Content: Thought content normal.        Judgment: Judgment normal.        Assessment & Plan:   1. Routine general medical examination at a health care facility - Encouraged heart healthy diet and aerobic exercise, start walking further - CBC  with Differential/Platelet - Comprehensive metabolic panel - Hemoglobin A1c - Lipid panel - TSH  2. Type 2 diabetes mellitus without complication, without long-term current use of insulin (HCC) - Consider increasing glipizide - Follow up in three months  - CBC with Differential/Platelet - Comprehensive metabolic panel - Hemoglobin A1c - Lipid panel - TSH  3. Essential hypertension - Monitor BP at home. Return precautions reviewed.  - BP has been controlled in the past - CBC with Differential/Platelet - Comprehensive metabolic panel - Hemoglobin A1c - Lipid panel - TSH  4. Prostate cancer screening  - PSA  5. Mixed hyperlipidemia - Likely add crestor 5 mg  - CBC with Differential/Platelet -  Comprehensive metabolic panel - Hemoglobin A1c - Lipid panel - TSH   [name], NP

## 2020-06-12 ENCOUNTER — Other Ambulatory Visit: Payer: Self-pay | Admitting: Adult Health

## 2020-06-12 DIAGNOSIS — I10 Essential (primary) hypertension: Secondary | ICD-10-CM

## 2020-06-12 NOTE — Telephone Encounter (Signed)
Sent to the pharmacy by e-scribe. 

## 2020-07-21 ENCOUNTER — Other Ambulatory Visit: Payer: Self-pay | Admitting: Adult Health

## 2020-07-21 DIAGNOSIS — E1169 Type 2 diabetes mellitus with other specified complication: Secondary | ICD-10-CM

## 2020-07-21 NOTE — Telephone Encounter (Signed)
SENT TO THE PHARMACY BY E-SCRIBE.   PT HAS UPCOMING FOLLOW UP.

## 2020-08-14 ENCOUNTER — Other Ambulatory Visit: Payer: Self-pay | Admitting: Adult Health

## 2020-08-14 DIAGNOSIS — E1169 Type 2 diabetes mellitus with other specified complication: Secondary | ICD-10-CM

## 2020-08-20 ENCOUNTER — Ambulatory Visit: Payer: Medicare Other | Admitting: Pharmacist

## 2020-08-20 DIAGNOSIS — I10 Essential (primary) hypertension: Secondary | ICD-10-CM

## 2020-08-20 DIAGNOSIS — E119 Type 2 diabetes mellitus without complications: Secondary | ICD-10-CM

## 2020-08-20 NOTE — Chronic Care Management (AMB) (Signed)
Chronic Care Management Pharmacy  Name: Jason [name]  MRN: [id number] DOB: 04-11-1956  Initial Questions: 1. Have you seen any other providers since your last visit? Yes -PCP visit in June 2. Any changes in your medicines or health? No   Chief Complaint/ HPI  Jason [name],  64 y.o. , male presents for their Follow-Up CCM visit with the clinical pharmacist via telephone due to COVID-19 Pandemic.  PCP : [name], NP  Their chronic conditions include: DM, HTN  Office Visits:   05/28/2020- [name], NP- Patient presented for annual exam. Today's A1c: 7.8 (slightly improved from 09/2019). TSH, PSA, CMP & CBC all WNL. Rosuvastatin 5 mg added due to DM diagnosis and ASCVD risk of 37%. Follow up in 3 months for DM and HTN.  10/10/2019- [name], NP- Patient presented for virtual visit for 3 month follow up for DM. Patient reported home BGs: 140s. Today's A1c: 8.0. Previously: 14% (12/2018). Patient to continue same doses of medication. Patient to follow up in 3 months for CPE.   Medications: Outpatient Encounter Medications as of 08/20/2020  Medication Sig  . Blood Glucose Monitoring Suppl (ONE TOUCH ULTRA 2) w/Device KIT Use the device daily to check your blood sugars 1-4 times as instructed.  . diltiazem (CARDIZEM CD) 120 MG 24 hr capsule TAKE 1 CAPSULE BY MOUTH EVERY DAY  . empagliflozin (JARDIANCE) 25 MG TABS tablet Take 25 mg by mouth daily.  Marland Kitchen glipiZIDE (GLUCOTROL XL) 10 MG 24 hr tablet TAKE 1 TABLET BY MOUTH DAILY WITH BREAKFAST  . glucose blood (ONE TOUCH ULTRA TEST) test strip USE TO TEST BLOOD GLUCOSE TWICE DAILY  . Lancets (ONETOUCH ULTRASOFT) lancets Use to check blood sugar BID  . metFORMIN (GLUCOPHAGE) 1000 MG tablet TAKE 1 TABLET BY MOUTH TWICE A DAY WITH FOOD  . rosuvastatin (CRESTOR) 5 MG tablet Take 1 tablet (5 mg total) by mouth at bedtime.   No facility-administered encounter medications on file as of 08/20/2020.    Current Diagnosis/Assessment:   Goals Addressed            This Visit's Progress   . Pharmacy Care Plan       CARE PLAN ENTRY  Current Barriers:  . Chronic Disease Management support, education, and care coordination needs related to Hypertension and Diabetes   Hypertension . Pharmacist Clinical Goal(s): o Over the next 30 days, patient will work with PharmD and providers to maintain BP goal <130/80 . Current regimen:  o Diltiazem (Cardizem CD) 120mg , 1 capsule once daily  . Interventions: . Discussed diet modifications. DASH diet:  following a diet emphasizing fruits and vegetables and low-fat dairy products along with whole grains, fish, poultry, and nuts. Reducing red meats and sugars.  . Patient self care activities - Over the next 30 days, patient will: o Check BP at least once weekly, document, and provide at future appointments. o Ensure daily salt intake < 2300 mg/day  Diabetes . Pharmacist Clinical Goal(s): o Over the next 90 days, patient will work with PharmD and providers to achieve A1c goal <7% . Current regimen:   empagliflozin (Jardiance) 25mg , 1 tablet once daily   Glipizide (Glucotrol XL) 10mg , 1 tablet once daily with breakfast  Metformin 1000mg , 1 tablet once daily with a meal . Patient self care activities - Over the next 90 days, patient will: o Check blood sugar as directed, document, and provide at future appointments o Contact provider with any episodes of hypoglycemia  Hyperlipidemia . Pharmacist Clinical Goal(s):  o Over the next 90 days, patient will work with PharmD and providers to achieve LDL goal < 70 . Current regimen:  o Rosuvastatin 5 mg, 1 tablet once daily  . Interventions: . Discussed diet modifications . Patient self care activities - Over the next 90 days, patient will: o Continue to work on diet and exercise modifications to lower cholesterol.  Medication management . Pharmacist Clinical Goal(s): o Over the next 90 days, patient will work with PharmD and  providers to maintain optimal medication adherence . Current [facility]: [facility] . Interventions o Comprehensive medication review performed. o Continue current medication management strategy . Patient self care activities - Over the next 90 days, patient will: o Take medications as prescribed o Report any questions or concerns to PharmD and/or provider(s)  Please see past updates related to this goal by clicking on the "Past Updates" button in the selected goal         Diabetes  A1c goal < 7%  Recent Relevant Labs: Lab Results  Component Value Date/Time   HGBA1C 7.8 (H) 05/28/2020 07:46 AM   HGBA1C 8.0 (H) 10/09/2019 07:20 AM   HGBA1C 9.5 (A) 01/10/2019 08:30 AM   MICROALBUR 3.1 (H) 10/10/2018 08:30 AM   MICROALBUR 7.0 (H) 02/02/2016 08:48 AM     Checking BG: Daily  Recent BG Readings: 181, 126, 146 7 day avg: 166 14 day avg: 154  30 day avg: 151 Avg on meter: 80-180; 15 highs, 4 mediums, no lows  Patient has failed these meds in past: none   Patient is currently uncontrolled on the following medications:   empagliflozin (Jardiance) 25mg , 1 tablet once daily   Glipizide (Glucotrol XL) 10mg , 1 tablet once daily with breakfast  Metformin 1000mg , 1 tablet twice daily with a meal (patient reports taking once daily - states under agreeance with Tommi Rumps; however no chart documentation found).   Last diabetic Eye exam: No results found for: HMDIABEYEEXA  - patient reports obtaining yearly eye exams and has plans to obtain this fall.   Last diabetic Foot exam: No results found for: HMDIABFOOTEX  - patient reports some tingling. But reports improved than before.   We discussed: diet and exercise extensively - diet: Patient is still losing weight; patient is drinking a lot of water; eats a lot of salads/greens - exercise: patient reports walking with grandson and does activities together (football, riding) -  A couple times a week, walking almost every day  Plan Continue current  medications  If BGs don't improve over the next month, will plan to increase slowly on metformin to 1000 mg in AM and 500 mg in PM.  Hypertension  Denies dizziness/lightheadedness.  Denies persistent HA/chest pain.  Goal BP <130/80  Office blood pressures are  BP Readings from Last 3 Encounters:  05/28/20 (!) 160/94  05/30/19 138/84  01/10/19 124/86   Patient has failed these meds in the past: none   Patient checks BP at home infrequently - could not recall any numbers at home    Patient is controlled on:   Diltiazem (Cardizem CD) 120mg , 1 capsule once daily   We discussed:  . The importance of monitoring blood pressure at home.  Plan Patient agreed to monitor BP at least once weekly and will follow up with CPA in 1 month. Continue current medications   Hyperlipidemia   LDL goal < 70  Lipid Panel     Component Value Date/Time   CHOL 156 05/28/2020 0746   TRIG 74.0 05/28/2020 0746  HDL 62.10 05/28/2020 0746   LDLCALC 79 05/28/2020 0746    Hepatic Function Latest Ref Rng & Units 05/28/2020 10/10/2018 02/02/2016  Total Protein 6.0 - 8.3 g/dL 7.8 7.4 7.4  Albumin 3.5 - 5.2 g/dL 4.1 4.1 4.3  AST 0 - 37 U/L _0 ALT 0 - 53 U/L 31 33 37  Alk Phosphatase 39 - 117 U/L 73 74 73  Total Bilirubin 0.2 - 1.2 mg/dL 0.3 0.4 0.4  Bilirubin, Direct 0.0 - 0.3 mg/dL - 0.1 -     The 10-year ASCVD risk score Mikey Bussing DC Jr., et al., 2013) is: 37%   Values used to calculate the score:     Age: 28 years     Sex: Male     Is Non-Hispanic African American: Yes     Diabetic: Yes     Tobacco smoker: No     Systolic Blood Pressure: 979 mmHg     Is BP treated: Yes     HDL Cholesterol: 62.1 mg/dL     Total Cholesterol: 156 mg/dL   Patient has failed these meds in past: none Patient is currently uncontrolled on the following medications:  . Rosuvastatin 5 mg 1 tablet daily   We discussed:  diet and exercise extensively  Plan Continue current medications Recommend repeat  lipid panel in 8-12 weeks to ensure adequate LDL lowering.  Vaccines   Reviewed and discussed patient's vaccination history.    Immunization History  Administered Date(s) Administered  . Fluad Quad(high Dose 65+) 09/11/2019  . Influenza Split 09/12/2012  . Influenza Whole 12/24/2007  . Influenza,inj,Quad PF,6+ Mos 02/02/2016, 10/10/2018  . Pneumococcal Conjugate-13 09/11/2019  . Pneumococcal Polysaccharide-23 02/25/2016  . Td 01/31/2001  . Tdap 02/02/2016    Patient reported he received COVID vaccine [employer]) in March but could not confirm the dates.   Plan  Recommended patient receive Shingrix vaccine at pharmacy.   Medication Management  Patient organizes medications: patient reports routine; and taking all meds once a day in the morning.   Primary [facility]  Adherence:   - no other gaps in refill history per medication dispense report from 02/22/20 to 08/20/20   Follow up Follow up for BP & BG assessment in one month with CPA. Follow up visit with PharmD in 3 months.    [name], PharmD Clinical Pharmacist [place] [facility]

## 2020-08-20 NOTE — Patient Instructions (Addendum)
Visit Information  Goals Addressed            This Visit's Progress   . Pharmacy Care Plan       CARE PLAN ENTRY  Current Barriers:  . Chronic Disease Management support, education, and care coordination needs related to Hypertension and Diabetes   Hypertension . Pharmacist Clinical Goal(s): o Over the next 30 days, patient will work with PharmD and providers to maintain BP goal <130/80 . Current regimen:  o Diltiazem (Cardizem CD) 120mg , 1 capsule once daily  . Interventions: . Discussed diet modifications. DASH diet:  following a diet emphasizing fruits and vegetables and low-fat dairy products along with whole grains, fish, poultry, and nuts. Reducing red meats and sugars.  . Patient self care activities - Over the next 30 days, patient will: o Check BP at least once weekly, document, and provide at future appointments. o Ensure daily salt intake < 2300 mg/day  Diabetes . Pharmacist Clinical Goal(s): o Over the next 90 days, patient will work with PharmD and providers to achieve A1c goal <7% . Current regimen:   empagliflozin (Jardiance) 25mg , 1 tablet once daily   Glipizide (Glucotrol XL) 10mg , 1 tablet once daily with breakfast  Metformin 1000mg , 1 tablet once daily with a meal . Patient self care activities - Over the next 90 days, patient will: o Check blood sugar as directed, document, and provide at future appointments o Contact provider with any episodes of hypoglycemia o Read nutrition labels on food and aim for 45 - 60 grams of carbs per meal  Hyperlipidemia . Pharmacist Clinical Goal(s): o Over the next 90 days, patient will work with PharmD and providers to achieve LDL goal < 70 . Current regimen:  o Rosuvastatin 5 mg, 1 tablet once daily  . Interventions: . Discussed diet modifications . Patient self care activities - Over the next 90 days, patient will: o Continue to work on diet and exercise modifications to lower cholesterol.  Medication  management . Pharmacist Clinical Goal(s): o Over the next 90 days, patient will work with PharmD and providers to maintain optimal medication adherence . Current pharmacy: [facility] . Interventions o Comprehensive medication review performed. o Continue current medication management strategy . Patient self care activities - Over the next 90 days, patient will: o Take medications as prescribed o Report any questions or concerns to PharmD and/or provider(s)  Please see past updates related to this goal by clicking on the "Past Updates" button in the selected goal         The patient verbalized understanding of instructions provided today and declined a print copy of patient instruction materials.   Telephone follow up appointment with pharmacy team member scheduled for: 1 month  [name], PharmD Clinical Pharmacist [facility] [phone]    Diabetes Mellitus and Nutrition, Adult When you have diabetes (diabetes mellitus), it is very important to have healthy eating habits because your blood sugar (glucose) levels are greatly affected by what you eat and drink. Eating healthy foods in the appropriate amounts, at about the same times every day, can help you:  Control your blood glucose.  Lower your risk of heart disease.  Improve your blood pressure.  Reach or maintain a healthy weight. Every person with diabetes is different, and each person has different needs for a meal plan. Your health care provider may recommend that you work with a diet and nutrition specialist (dietitian) to make a meal plan that is best for you. Your  meal plan may vary depending on factors such as:  The calories you need.  The medicines you take.  Your weight.  Your blood glucose, blood pressure, and cholesterol levels.  Your activity level.  Other health conditions you have, such as heart or kidney disease. How do carbohydrates affect me? Carbohydrates, also called  carbs, affect your blood glucose level more than any other type of food. Eating carbs naturally raises the amount of glucose in your blood. Carb counting is a method for keeping track of how many carbs you eat. Counting carbs is important to keep your blood glucose at a healthy level, especially if you use insulin or take certain oral diabetes medicines. It is important to know how many carbs you can safely have in each meal. This is different for every person. Your dietitian can help you calculate how many carbs you should have at each meal and for each snack. Foods that contain carbs include:  Bread, cereal, rice, pasta, and crackers.  Potatoes and corn.  Peas, beans, and lentils.  Milk and yogurt.  Fruit and juice.  Desserts, such as cakes, cookies, ice cream, and candy. How does alcohol affect me? Alcohol can cause a sudden decrease in blood glucose (hypoglycemia), especially if you use insulin or take certain oral diabetes medicines. Hypoglycemia can be a life-threatening condition. Symptoms of hypoglycemia (sleepiness, dizziness, and confusion) are similar to symptoms of having too much alcohol. If your health care provider says that alcohol is safe for you, follow these guidelines:  Limit alcohol intake to no more than 1 drink per day for nonpregnant women and 2 drinks per day for men. One drink equals 12 oz of beer, 5 oz of wine, or 1 oz of hard liquor.  Do not drink on an empty stomach.  Keep yourself hydrated with water, diet soda, or unsweetened iced tea.  Keep in mind that regular soda, juice, and other mixers may contain a lot of sugar and must be counted as carbs. What are tips for following this plan?  Reading food labels  Start by checking the serving size on the "Nutrition Facts" label of packaged foods and drinks. The amount of calories, carbs, fats, and other nutrients listed on the label is based on one serving of the item. Many items contain more than one serving  per package.  Check the total grams (g) of carbs in one serving. You can calculate the number of servings of carbs in one serving by dividing the total carbs by 15. For example, if a food has 30 g of total carbs, it would be equal to 2 servings of carbs.  Check the number of grams (g) of saturated and trans fats in one serving. Choose foods that have low or no amount of these fats.  Check the number of milligrams (mg) of salt (sodium) in one serving. Most people should limit total sodium intake to less than 2,300 mg per day.  Always check the nutrition information of foods labeled as "low-fat" or "nonfat". These foods may be higher in added sugar or refined carbs and should be avoided.  Talk to your dietitian to identify your daily goals for nutrients listed on the label. Shopping  Avoid buying canned, premade, or processed foods. These foods tend to be high in fat, sodium, and added sugar.  Shop around the outside edge of the grocery store. This includes fresh fruits and vegetables, bulk grains, fresh meats, and fresh dairy. Cooking  Use low-heat cooking methods, such as  baking, instead of high-heat cooking methods like deep frying.  Cook using healthy oils, such as olive, canola, or sunflower oil.  Avoid cooking with butter, cream, or high-fat meats. Meal planning  Eat meals and snacks regularly, preferably at the same times every day. Avoid going long periods of time without eating.  Eat foods high in fiber, such as fresh fruits, vegetables, beans, and whole grains. Talk to your dietitian about how many servings of carbs you can eat at each meal.  Eat 4-6 ounces (oz) of lean protein each day, such as lean meat, chicken, fish, eggs, or tofu. One oz of lean protein is equal to: ? 1 oz of meat, chicken, or fish. ? 1 egg. ?  cup of tofu.  Eat some foods each day that contain healthy fats, such as avocado, nuts, seeds, and fish. Lifestyle  Check your blood glucose  regularly.  Exercise regularly as told by your health care provider. This may include: ? 150 minutes of moderate-intensity or vigorous-intensity exercise each week. This could be brisk walking, biking, or water aerobics. ? Stretching and doing strength exercises, such as yoga or weightlifting, at least 2 times a week.  Take medicines as told by your health care provider.  Do not use any products that contain nicotine or tobacco, such as cigarettes and e-cigarettes. If you need help quitting, ask your health care provider.  Work with a Social worker or diabetes educator to identify strategies to manage stress and any emotional and social challenges. Questions to ask a health care provider  Do I need to meet with a diabetes educator?  Do I need to meet with a dietitian?  What number can I call if I have questions?  When are the best times to check my blood glucose? Where to find more information:  American Diabetes Association: diabetes.org  Academy of Nutrition and Dietetics: www.eatright.[employer] of Diabetes and Digestive and Kidney Diseases (NIH): [employer] Summary  A healthy meal plan will help you control your blood glucose and maintain a healthy lifestyle.  Working with a diet and nutrition specialist (dietitian) can help you make a meal plan that is best for you.  Keep in mind that carbohydrates (carbs) and alcohol have immediate effects on your blood glucose levels. It is important to count carbs and to use alcohol carefully. This information is not intended to replace advice given to you by your health care provider. Make sure you discuss any questions you have with your health care provider. Document Revised: 11/10/2017 Document Reviewed: 01/02/2017 Elsevier Patient Education  2020 [employer].

## 2020-09-03 ENCOUNTER — Encounter: Payer: Self-pay | Admitting: Adult Health

## 2020-09-03 ENCOUNTER — Other Ambulatory Visit: Payer: Self-pay

## 2020-09-03 ENCOUNTER — Ambulatory Visit (INDEPENDENT_AMBULATORY_CARE_PROVIDER_SITE_OTHER): Payer: Medicare Other | Admitting: Adult Health

## 2020-09-03 VITALS — BP 138/88 | HR 63 | Temp 98.1°F | Wt 246.6 lb

## 2020-09-03 DIAGNOSIS — Z23 Encounter for immunization: Secondary | ICD-10-CM | POA: Diagnosis not present

## 2020-09-03 DIAGNOSIS — E1169 Type 2 diabetes mellitus with other specified complication: Secondary | ICD-10-CM

## 2020-09-03 LAB — POCT GLYCOSYLATED HEMOGLOBIN (HGB A1C): Hemoglobin A1C: 7.1 % — AB (ref 4.0–5.6)

## 2020-09-03 MED ORDER — EMPAGLIFLOZIN 25 MG PO TABS: 25.0000 mg | ORAL_TABLET | Freq: Every day | ORAL | 0 refills | Status: DC

## 2020-09-03 NOTE — Progress Notes (Signed)
Subjective:    Patient ID: Jason [name], male    DOB: March 22, 1956, 64 y.o.   MRN: [id number]  HPI 64 year old male who  has a past medical history of Bell palsy, Diabetes mellitus without complication ([place]), HTN (hypertension), and Hyperglycemia.  Presents to the office today for follow-up regarding diabetes mellitus.  He is currently prescribed glipizide 10 mg extended release, Metformin 1000 mg daily, and Jardiance 25 mg daily.  He has been checking his blood sugars at home intermittently and per his log have been between 116-180 with most readings in the 130-140's. He has been working on diet and exercise to bring down his blood sugars.   Lab Results  Component Value Date   HGBA1C 7.8 (H) 05/28/2020   Wt Readings from Last 3 Encounters:  09/03/20 246 lb 9.6 oz (111.9 kg)  05/28/20 246 lb (111.6 kg)  09/05/19 247 lb (112 kg)     Review of Systems See HPI   Past Medical History:  Diagnosis Date  . Bell palsy   . Diabetes mellitus without complication ([place])   . HTN (hypertension)   . Hyperglycemia     Social History   Socioeconomic History  . Marital status: Married    Spouse name: Not on file  . Number of children: 2  . Years of education: 20  . Highest education level: Not on file  Occupational History  . Occupation: [phone]    Employer: [employer]  . Occupation: Truck Geophysicist/field seismologist  Tobacco Use  . Smoking status: Never Smoker  . Smokeless tobacco: Never Used  Vaping Use  . Vaping Use: Never used  Substance and Sexual Activity  . Alcohol use: Yes    Alcohol/week: 6.0 standard drinks    Types: 6 Cans of beer per week    Comment: on weekends  . Drug use: No  . Sexual activity: Not on file  Other Topics Concern  . 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       Fun: fix cars, drag race    Social Determinants of Health   Financial Resource Strain: Low  Risk   . Difficulty of Paying Living Expenses: Not hard at all  Food Insecurity:   . Worried About Charity fundraiser in the Last Year: Not on file  . Ran Out of Food in the Last Year: Not on file  Transportation Needs: No Transportation Needs  . Lack of Transportation (Medical): No  . Lack of Transportation (Non-Medical): No  Physical Activity:   . Days of Exercise per Week: Not on file  . Minutes of Exercise per Session: Not on file  Stress:   . Feeling of Stress : Not on file  Social Connections:   . Frequency of Communication with Friends and Family: Not on file  . Frequency of Social Gatherings with Friends and Family: Not on file  . Attends Religious Services: Not on file  . Active Member of Clubs or Organizations: Not on file  . Attends Archivist Meetings: Not on file  . Marital Status: Not on file  Intimate Partner Violence:   . Fear of Current or Ex-Partner: Not on file  . Emotionally Abused: Not on file  . Physically Abused: Not on file  . Sexually Abused: Not on file    Past Surgical History:  Procedure Laterality Date  .  bellpalsy    . COLONOSCOPY    . COLONOSCOPY W/ POLYPECTOMY      Family History  Problem Relation Age of Onset  . Stroke Mother   . Colon cancer Brother   . High blood pressure Brother   . Stomach cancer Brother   . Colon cancer Other        1st degree relative <60  . Diabetes Other        1st degree relative   . Stroke Other   . Colon cancer Sister   . High blood pressure Sister   . Esophageal cancer Neg Hx   . Rectal cancer Neg Hx     Allergies  Allergen Reactions  . Lisinopril     hyperkalemia    Current Outpatient Medications on File Prior to Visit  Medication Sig Dispense Refill  . Blood Glucose Monitoring Suppl (ONE TOUCH ULTRA 2) w/Device KIT Use the device daily to check your blood sugars 1-4 times as instructed. 1 kit 0  . diltiazem (CARDIZEM CD) 120 MG 24 hr capsule TAKE 1 CAPSULE BY MOUTH EVERY DAY 90  capsule 3  . empagliflozin (JARDIANCE) 25 MG TABS tablet Take 25 mg by mouth daily. 90 tablet 0  . glipiZIDE (GLUCOTROL XL) 10 MG 24 hr tablet TAKE 1 TABLET BY MOUTH DAILY WITH BREAKFAST 90 tablet 0  . glucose blood (ONE TOUCH ULTRA TEST) test strip USE TO TEST BLOOD GLUCOSE TWICE DAILY 200 each 3  . Lancets (ONETOUCH ULTRASOFT) lancets Use to check blood sugar BID 100 each 12  . metFORMIN (GLUCOPHAGE) 1000 MG tablet TAKE 1 TABLET BY MOUTH TWICE A DAY WITH FOOD 180 tablet 0  . rosuvastatin (CRESTOR) 5 MG tablet Take 1 tablet (5 mg total) by mouth at bedtime. 90 tablet 3   No current facility-administered medications on file prior to visit.    There were no vitals taken for this visit.      Objective:   Physical Exam Vitals and nursing note reviewed.  Constitutional:      Appearance: Normal appearance.  Cardiovascular:     Rate and Rhythm: Normal rate and regular rhythm.     Pulses: Normal pulses.     Heart sounds: Normal heart sounds.  Pulmonary:     Effort: Pulmonary effort is normal.     Breath sounds: Normal breath sounds.  Skin:    General: Skin is warm and dry.     Capillary Refill: Capillary refill takes less than 2 seconds.  Neurological:     General: No focal deficit present.     Mental Status: He is alert and oriented to person, place, and time.  Psychiatric:        Mood and Affect: Mood normal.        Behavior: Behavior normal.        Thought Content: Thought content normal.        Judgment: Judgment normal.       Assessment & Plan:  1. Type 2 diabetes mellitus with other specified complication, without long-term current use of insulin (HCC)  - POC HgB A1c- 7.1 - has improved and nearly at goal. Continue with lifestyle modifications and no change in medications.  - Follow up in three months  - empagliflozin (JARDIANCE) 25 MG TABS tablet; Take 1 tablet (25 mg total) by mouth daily.  Dispense: 90 tablet; Refill: 0  2. Need for immunization against influenza   - Flu Vaccine QUAD High Dose(Fluad)  [name], NP

## 2020-09-03 NOTE — Patient Instructions (Signed)
Your A1c improved from 7.8 to 7.1 - great job   Lets follow up in 3 months to recheck

## 2020-09-07 ENCOUNTER — Ambulatory Visit (INDEPENDENT_AMBULATORY_CARE_PROVIDER_SITE_OTHER): Payer: Medicare Other

## 2020-09-07 ENCOUNTER — Other Ambulatory Visit: Payer: Self-pay

## 2020-09-07 DIAGNOSIS — Z0000 Encounter for general adult medical examination without abnormal findings: Secondary | ICD-10-CM | POA: Diagnosis not present

## 2020-09-07 NOTE — Patient Instructions (Signed)
Jason [name] , Thank you for taking time to come for your Medicare Wellness Visit. I appreciate your ongoing commitment to your health goals. Please review the following plan we discussed and let me know if I can assist you in the future.   Screening recommendations/referrals: Colonoscopy: Up to date, next due 05/29/2022 Recommended yearly ophthalmology/optometry visit for glaucoma screening and checkup Recommended yearly dental visit for hygiene and checkup  Vaccinations: Influenza vaccine: Up to date, next due 2022-2023 Pneumococcal vaccine: Completed series  Tdap vaccine: up to date, next due 02/01/2026 Shingles vaccine: Currently due for Shingrix, please contact your pharmacy to discuss cost and to receive the vaccine.    Advanced directives: Advance directive discussed with you today. Even though you declined this today please call our office should you change your mind and we can give you the proper paperwork for you to fill out.   Conditions/risks identified: None   Next appointment: 11/17/2020 @ 8:30 am with Pharmacist via Telephone   Preventive Care 65 Years and Older, Male Preventive care refers to lifestyle choices and visits with your health care provider that can promote health and wellness. What does preventive care include?  A yearly physical exam. This is also called an annual well check.  Dental exams once or twice a year.  Routine eye exams. Ask your health care provider how often you should have your eyes checked.  Personal lifestyle choices, including:  Daily care of your teeth and gums.  Regular physical activity.  Eating a healthy diet.  Avoiding tobacco and drug use.  Limiting alcohol use.  Practicing safe sex.  Taking low doses of aspirin every day.  Taking vitamin and mineral supplements as recommended by your health care provider. What happens during an annual well check? The services and screenings done by your health care provider during  your annual well check will depend on your age, overall health, lifestyle risk factors, and family history of disease. Counseling  Your health care provider may ask you questions about your:  Alcohol use.  Tobacco use.  Drug use.  Emotional well-being.  Home and relationship well-being.  Sexual activity.  Eating habits.  History of falls.  Memory and ability to understand (cognition).  Work and work Statistician. Screening  You may have the following tests or measurements:  Height, weight, and BMI.  Blood pressure.  Lipid and cholesterol levels. These may be checked every 5 years, or more frequently if you are over 31 years old.  Skin check.  Lung cancer screening. You may have this screening every year starting at age 59 if you have a 30-pack-year history of smoking and currently smoke or have quit within the past 15 years.  Fecal occult blood test (FOBT) of the stool. You may have this test every year starting at age 2.  Flexible sigmoidoscopy or colonoscopy. You may have a sigmoidoscopy every 5 years or a colonoscopy every 10 years starting at age 73.  Prostate cancer screening. Recommendations will vary depending on your family history and other risks.  Hepatitis C blood test.  Hepatitis B blood test.  Sexually transmitted disease (STD) testing.  Diabetes screening. This is done by checking your blood sugar (glucose) after you have not eaten for a while (fasting). You may have this done every 1-3 years.  Abdominal aortic aneurysm (AAA) screening. You may need this if you are a current or former smoker.  Osteoporosis. You may be screened starting at age 46 if you are at high risk. Talk  with your health care provider about your test results, treatment options, and if necessary, the need for more tests. Vaccines  Your health care provider may recommend certain vaccines, such as:  Influenza vaccine. This is recommended every year.  Tetanus, diphtheria, and  acellular pertussis (Tdap, Td) vaccine. You may need a Td booster every 10 years.  Zoster vaccine. You may need this after age 32.  Pneumococcal 13-valent conjugate (PCV13) vaccine. One dose is recommended after age 14.  Pneumococcal polysaccharide (PPSV23) vaccine. One dose is recommended after age 43. Talk to your health care provider about which screenings and vaccines you need and how often you need them. This information is not intended to replace advice given to you by your health care provider. Make sure you discuss any questions you have with your health care provider. Document Released: 12/25/2015 Document Revised: 08/17/2016 Document Reviewed: 09/29/2015 Elsevier Interactive Patient Education  2017 [place] Prevention in the Home Falls can cause injuries. They can happen to people of all ages. There are many things you can do to make your home safe and to help prevent falls. What can I do on the outside of my home?  Regularly fix the edges of walkways and driveways and fix any cracks.  Remove anything that might make you trip as you walk through a door, such as a raised step or threshold.  Trim any bushes or trees on the path to your home.  Use bright outdoor lighting.  Clear any walking paths of anything that might make someone trip, such as rocks or tools.  Regularly check to see if handrails are loose or broken. Make sure that both sides of any steps have handrails.  Any raised decks and porches should have guardrails on the edges.  Have any leaves, snow, or ice cleared regularly.  Use sand or salt on walking paths during winter.  Clean up any spills in your garage right away. This includes oil or grease spills. What can I do in the bathroom?  Use night lights.  Install grab bars by the toilet and in the tub and shower. Do not use towel bars as grab bars.  Use non-skid mats or decals in the tub or shower.  If you need to sit down in the shower, use  a plastic, non-slip stool.  Keep the floor dry. Clean up any water that spills on the floor as soon as it happens.  Remove soap buildup in the tub or shower regularly.  Attach bath mats securely with double-sided non-slip rug tape.  Do not have throw rugs and other things on the floor that can make you trip. What can I do in the bedroom?  Use night lights.  Make sure that you have a light by your bed that is easy to reach.  Do not use any sheets or blankets that are too big for your bed. They should not hang down onto the floor.  Have a firm chair that has side arms. You can use this for support while you get dressed.  Do not have throw rugs and other things on the floor that can make you trip. What can I do in the kitchen?  Clean up any spills right away.  Avoid walking on wet floors.  Keep items that you use a lot in easy-to-reach places.  If you need to reach something above you, use a strong step stool that has a grab bar.  Keep electrical cords out of the way.  Do  not use floor polish or wax that makes floors slippery. If you must use wax, use non-skid floor wax.  Do not have throw rugs and other things on the floor that can make you trip. What can I do with my stairs?  Do not leave any items on the stairs.  Make sure that there are handrails on both sides of the stairs and use them. Fix handrails that are broken or loose. Make sure that handrails are as long as the stairways.  Check any carpeting to make sure that it is firmly attached to the stairs. Fix any carpet that is loose or worn.  Avoid having throw rugs at the top or bottom of the stairs. If you do have throw rugs, attach them to the floor with carpet tape.  Make sure that you have a light switch at the top of the stairs and the bottom of the stairs. If you do not have them, ask someone to add them for you. What else can I do to help prevent falls?  Wear shoes that:  Do not have high heels.  Have  rubber bottoms.  Are comfortable and fit you well.  Are closed at the toe. Do not wear sandals.  If you use a stepladder:  Make sure that it is fully opened. Do not climb a closed stepladder.  Make sure that both sides of the stepladder are locked into place.  Ask someone to hold it for you, if possible.  Clearly mark and make sure that you can see:  Any grab bars or handrails.  First and last steps.  Where the edge of each step is.  Use tools that help you move around (mobility aids) if they are needed. These include:  Canes.  Walkers.  Scooters.  Crutches.  Turn on the lights when you go into a dark area. Replace any light bulbs as soon as they burn out.  Set up your furniture so you have a clear path. Avoid moving your furniture around.  If any of your floors are uneven, fix them.  If there are any pets around you, be aware of where they are.  Review your medicines with your doctor. Some medicines can make you feel dizzy. This can increase your chance of falling. Ask your doctor what other things that you can do to help prevent falls. This information is not intended to replace advice given to you by your health care provider. Make sure you discuss any questions you have with your health care provider. Document Released: 09/24/2009 Document Revised: 05/05/2016 Document Reviewed: 01/02/2015 Elsevier Interactive Patient Education  2017 [employer].

## 2020-09-07 NOTE — Progress Notes (Signed)
Subjective:   Jason [name] is a 64 y.o. male who presents for Medicare Annual/Subsequent preventive examination.  I connected with Delories Heinz  today by telephone and verified that I am speaking with the correct person using two identifiers. Location patient: home Location provider: work Persons participating in the virtual visit: patient, provider.   I discussed the limitations, risks, security and privacy concerns of performing an evaluation and management service by telephone and the availability of in person appointments. I also discussed with the patient that there may be a patient responsible charge related to this service. The patient expressed understanding and verbally consented to this telephonic visit.    Interactive audio and video telecommunications were attempted between this provider and patient, however failed, due to patient having technical difficulties OR patient did not have access to video capability.  We continued and completed visit with audio only.     Review of Systems    N/A Cardiac Risk Factors include: advanced age (>52mn, >>68women);diabetes mellitus;male gender     Objective:    There were no vitals filed for this visit. There is no height or weight on file to calculate BMI.  Advanced Directives 09/07/2020 03/28/2016 03/14/2016  Does Patient Have a Medical Advance Directive? No No No  Would patient like information on creating a medical advance directive? No - Patient declined No - patient declined information No - patient declined information    Current Medications (verified) Outpatient Encounter Medications as of 09/07/2020  Medication Sig  . Blood Glucose Monitoring Suppl (ONE TOUCH ULTRA 2) w/Device KIT Use the device daily to check your blood sugars 1-4 times as instructed.  . diltiazem (CARDIZEM CD) 120 MG 24 hr capsule TAKE 1 CAPSULE BY MOUTH EVERY DAY  . empagliflozin (JARDIANCE) 25 MG TABS tablet Take 1 tablet (25 mg total) by mouth  daily.  .Marland KitchenglipiZIDE (GLUCOTROL XL) 10 MG 24 hr tablet TAKE 1 TABLET BY MOUTH DAILY WITH BREAKFAST  . glucose blood (ONE TOUCH ULTRA TEST) test strip USE TO TEST BLOOD GLUCOSE TWICE DAILY  . Lancets (ONETOUCH ULTRASOFT) lancets Use to check blood sugar BID  . metFORMIN (GLUCOPHAGE) 1000 MG tablet TAKE 1 TABLET BY MOUTH TWICE A DAY WITH FOOD  . rosuvastatin (CRESTOR) 5 MG tablet Take 1 tablet (5 mg total) by mouth at bedtime.   No facility-administered encounter medications on file as of 09/07/2020.    Allergies (verified) Lisinopril   History: Past Medical History:  Diagnosis Date  . Bell palsy   . Diabetes mellitus without complication [place]   . HTN (hypertension)   . Hyperglycemia    Past Surgical History:  Procedure Laterality Date  . bellpalsy    . COLONOSCOPY    . COLONOSCOPY W/ POLYPECTOMY     Family History  Problem Relation Age of Onset  . Stroke Mother   . Colon cancer Brother   . High blood pressure Brother   . Stomach cancer Brother   . Colon cancer Other        1st degree relative <60  . Diabetes Other        1st degree relative   . Stroke Other   . Colon cancer Sister   . High blood pressure Sister   . Esophageal cancer Neg Hx   . Rectal cancer Neg Hx    Social History   Socioeconomic History  . Marital status: Married    Spouse name: Not on file  . Number of children: 2  .  Years of education: 44  . Highest education level: Not on file  Occupational History  . Occupation: [phone]    Employer: [employer]  . Occupation: Truck Geophysicist/field seismologist  Tobacco Use  . Smoking status: Never Smoker  . Smokeless tobacco: Never Used  Vaping Use  . Vaping Use: Never used  Substance and Sexual Activity  . Alcohol use: Yes    Alcohol/week: 6.0 standard drinks    Types: 6 Cans of beer per week    Comment: on weekends  . Drug use: No  . Sexual activity: Not on file  Other Topics Concern  . Not on file  Social History Narrative      Drivers a taxi  [place]    Married    Two children - both live locally.       Fun: fix cars, drag race    Social Determinants of Health   Financial Resource Strain: Low Risk   . Difficulty of Paying Living Expenses: Not hard at all  Food Insecurity: No Food Insecurity  . Worried About Charity fundraiser in the Last Year: Never true  . Ran Out of Food in the Last Year: Never true  Transportation Needs: No Transportation Needs  . Lack of Transportation (Medical): No  . Lack of Transportation (Non-Medical): No  Physical Activity: Insufficiently Active  . Days of Exercise per Week: 3 days  . Minutes of Exercise per Session: 30 min  Stress: No Stress Concern Present  . Feeling of Stress : Not at all  Social Connections: Moderately Isolated  . Frequency of Communication with Friends and Family: More than three times a week  . Frequency of Social Gatherings with Friends and Family: More than three times a week  . Attends Religious Services: More than 4 times per year  . Active Member of Clubs or Organizations: No  . Attends Archivist Meetings: Never  . Marital Status: Widowed    Tobacco Counseling Counseling given: Not Answered   Clinical Intake:  Pre-visit preparation completed: Yes  Pain : No/denies pain     Diabetes: Yes (Patient states checks blood sugars daily) CBG done?: No Did pt. bring in CBG monitor from home?: No  How often do you need to have someone help you when you read instructions, pamphlets, or other written materials from your doctor or pharmacy?: 1 - Never What is the last grade level you completed in school?: 12th grade  Diabetic?Yes     Information entered by :: SCrews, LPN   Activities of Daily Living In your present state of health, do you have any difficulty performing the following activities: 09/07/2020  Hearing? N  Vision? N  Difficulty concentrating or making decisions? N  Walking or climbing stairs? N  Dressing or bathing? N    Doing errands, shopping? N  Preparing Food and eating ? N  Using the Toilet? N  In the past six months, have you accidently leaked urine? N  Do you have problems with loss of bowel control? N  Managing your Medications? N  Managing your Finances? N  Housekeeping or managing your Housekeeping? N  Some recent data might be hidden    Patient Care Team: [name], NP as PCP - General (Family Medicine) [name], [facility] as Pharmacist (Pharmacist)  Indicate any recent Medical Services you may have received from other than [facility] providers in the past year (date may be approximate).     Assessment:   This is a routine  wellness examination for [facility].  Hearing/Vision screen  Hearing Screening   '125Hz'  '250Hz'  '500Hz'  '1000Hz'  '2000Hz'  '3000Hz'  '4000Hz'  '6000Hz'  '8000Hz'   Right ear:           Left ear:           Vision Screening Comments: Patient states gets his eyes checked yearly   Dietary issues and exercise activities discussed: Current Exercise Habits: Home exercise routine, Type of exercise: walking, Time (Minutes): 30, Frequency (Times/Week): 3, Weekly Exercise (Minutes/Week): 90, Intensity: Mild  Goals    . Patient Stated     I will continue to exercise at least 3 days per week    . Pharmacy Care Plan     CARE PLAN ENTRY  Current Barriers:  . Chronic Disease Management support, education, and care coordination needs related to Hypertension and Diabetes   Hypertension . Pharmacist Clinical Goal(s): o Over the next 30 days, patient will work with PharmD and providers to maintain BP goal <130/80 . Current regimen:  o Diltiazem (Cardizem CD) 152m, 1 capsule once daily  . Interventions: . Discussed diet modifications. DASH diet:  following a diet emphasizing fruits and vegetables and low-fat dairy products along with whole grains, fish, poultry, and nuts. Reducing red meats and sugars.  . Patient self care activities - Over the next 30 days, patient will: o Check BP at least once  weekly, document, and provide at future appointments. o Ensure daily salt intake < 2300 mg/day  Diabetes . Pharmacist Clinical Goal(s): o Over the next 90 days, patient will work with PharmD and providers to achieve A1c goal <7% . Current regimen:   empagliflozin (Jardiance) 260m 1 tablet once daily   Glipizide (Glucotrol XL) 1034m1 tablet once daily with breakfast  Metformin 1000m49m tablet once daily with a meal . Patient self care activities - Over the next 90 days, patient will: o Check blood sugar as directed, document, and provide at future appointments o Contact provider with any episodes of hypoglycemia o Read nutrition labels on food and aim for 45 - 60 grams of carbs per meal  Hyperlipidemia . Pharmacist Clinical Goal(s): o Over the next 90 days, patient will work with PharmD and providers to achieve LDL goal < 70 . Current regimen:  o Rosuvastatin 5 mg, 1 tablet once daily  . Interventions: . Discussed diet modifications . Patient self care activities - Over the next 90 days, patient will: o Continue to work on diet and exercise modifications to lower cholesterol.  Medication management . Pharmacist Clinical Goal(s): o Over the next 90 days, patient will work with PharmD and providers to maintain optimal medication adherence . Current pharmacy: [facility] . Interventions o Comprehensive medication review performed. o Continue current medication management strategy . Patient self care activities - Over the next 90 days, patient will: o Take medications as prescribed o Report any questions or concerns to PharmD and/or provider(s)  Please see past updates related to this goal by clicking on the "Past Updates" button in the selected goal        Depression Screen PHQ 2/9 Scores 09/07/2020 09/05/2019 02/02/2016  PHQ - 2 Score 0 0 0  PHQ- 9 Score 0 - -    Fall Risk Fall Risk  09/07/2020 09/05/2019 02/02/2016  Falls in the past year? 0 0 No  Number falls in past yr: 0 0 -    Injury with Fall? 0 0 -  Risk for fall due to : Medication side effect - -  Follow up  Falls evaluation completed;Falls prevention discussed - -    Any stairs in or around the home? No  If so, are there any without handrails? No  Home free of loose throw rugs in walkways, pet beds, electrical cords, etc? Yes  Adequate lighting in your home to reduce risk of falls? Yes   ASSISTIVE DEVICES UTILIZED TO PREVENT FALLS:  Life alert? No  Use of a cane, walker or w/c? No  Grab bars in the bathroom? No  Shower chair or bench in shower? No  Elevated toilet seat or a handicapped toilet? No   Cognitive Function:        Immunizations Immunization History  Administered Date(s) Administered  . Fluad Quad(high Dose 65+) 09/11/2019, 09/03/2020  . Influenza Split 09/12/2012  . Influenza Whole 12/24/2007  . Influenza,inj,Quad PF,6+ Mos 02/02/2016, 10/10/2018  . PFIZER SARS-COV-2 Vaccination 02/24/2020, 03/16/2020  . Pneumococcal Conjugate-13 09/11/2019  . Pneumococcal Polysaccharide-23 02/25/2016  . Td 01/31/2001  . Tdap 02/02/2016    TDAP status: Up to date Flu Vaccine status: Up to date Pneumococcal vaccine status: Up to date Covid-19 vaccine status: Completed vaccines  Qualifies for Shingles Vaccine? Yes   Zostavax completed No   Shingrix Completed?: No.    Education has been provided regarding the importance of this vaccine. Patient has been advised to call insurance company to determine out of pocket expense if they have not yet received this vaccine. Advised may also receive vaccine at local pharmacy or Health Dept. Verbalized acceptance and understanding.  Screening Tests Health Maintenance  Topic Date Due  . OPHTHALMOLOGY EXAM  09/12/2019  . URINE MICROALBUMIN  05/17/2021 (Originally 10/11/2019)  . PNA vac Low Risk Adult (2 of 2 - PPSV23) 02/24/2021  . HEMOGLOBIN A1C  03/03/2021  . FOOT EXAM  05/28/2021  . COLONOSCOPY  05/29/2022  . TETANUS/TDAP  02/01/2026  . INFLUENZA  VACCINE  Completed  . COVID-19 Vaccine  Completed  . Hepatitis C Screening  Completed    Health Maintenance  Health Maintenance Due  Topic Date Due  . OPHTHALMOLOGY EXAM  09/12/2019    Colorectal cancer screening: Completed 05/30/2019. Repeat every 3 years  Lung Cancer Screening: (Low Dose CT Chest recommended if Age 41-80 years, 30 pack-year currently smoking OR have quit w/in 15years.) does not qualify.   Lung Cancer Screening Referral: N/A  Additional Screening:  Hepatitis C Screening: does qualify; Completed 02/02/2016  Vision Screening: Recommended annual ophthalmology exams for early detection of glaucoma and other disorders of the eye. Is the patient up to date with their annual eye exam?  Yes  Who is the provider or what is the name of the office in which the patient attends annual eye exams? Dr. Bing Plume If pt is not established with a provider, would they like to be referred to a provider to establish care? No .   Dental Screening: Recommended annual dental exams for proper oral hygiene  Community Resource Referral / Chronic Care Management: CRR required this visit?  No   CCM required this visit?  No      Plan:     I have personally reviewed and noted the following in the patient's chart:   . Medical and social history . Use of alcohol, tobacco or illicit drugs  . Current medications and supplements . Functional ability and status . Nutritional status . Physical activity . Advanced directives . List of other physicians . Hospitalizations, surgeries, and ER visits in previous 12 months . Vitals . Screenings to include cognitive, depression,  and falls . Referrals and appointments  In addition, I have reviewed and discussed with patient certain preventive protocols, quality metrics, and best practice recommendations. A written personalized care plan for preventive services as well as general preventive health recommendations were provided to patient.      [name], LPN   [id number]   Nurse Notes: None

## 2020-10-06 ENCOUNTER — Telehealth: Payer: Self-pay | Admitting: Pharmacist

## 2020-10-06 NOTE — Chronic Care Management (AMB) (Signed)
Chronic Care Management Pharmacy Assistant   Name: Jason [name]  MRN: [id number] DOB: 1956/09/02  Reason for Encounter: Disease State/ Hypertension and Diabetes Adherence Call  PCP : [name], NP  Allergies:   Allergies  Allergen Reactions  . Lisinopril     hyperkalemia    Medications: Outpatient Encounter Medications as of 10/06/2020  Medication Sig  . Blood Glucose Monitoring Suppl (ONE TOUCH ULTRA 2) w/Device KIT Use the device daily to check your blood sugars 1-4 times as instructed.  . diltiazem (CARDIZEM CD) 120 MG 24 hr capsule TAKE 1 CAPSULE BY MOUTH EVERY DAY  . empagliflozin (JARDIANCE) 25 MG TABS tablet Take 1 tablet (25 mg total) by mouth daily.  Marland Kitchen glipiZIDE (GLUCOTROL XL) 10 MG 24 hr tablet TAKE 1 TABLET BY MOUTH DAILY WITH BREAKFAST  . glucose blood (ONE TOUCH ULTRA TEST) test strip USE TO TEST BLOOD GLUCOSE TWICE DAILY  . Lancets (ONETOUCH ULTRASOFT) lancets Use to check blood sugar BID  . metFORMIN (GLUCOPHAGE) 1000 MG tablet TAKE 1 TABLET BY MOUTH TWICE A DAY WITH FOOD  . rosuvastatin (CRESTOR) 5 MG tablet Take 1 tablet (5 mg total) by mouth at bedtime.   No facility-administered encounter medications on file as of 10/06/2020.    Current Diagnosis: Patient Active Problem List   Diagnosis Date Noted  . Routine general medical examination at a health care facility 02/02/2016  . Type 2 diabetes mellitus ([place]) 06/12/2012  . Syncope - neurally mediated 07/04/2011  . Essential hypertension 12/24/2007  . HYPERGLYCEMIA 12/24/2007    Goals Addressed   None    Reviewed chart prior to disease state call. Spoke with patient regarding BP  Recent Office Vitals: BP Readings from Last 3 Encounters:  09/03/20 138/88  05/28/20 (!) 160/94  05/30/19 138/84   Pulse Readings from Last 3 Encounters:  09/03/20 63  05/30/19 (!) 53  04/26/16 69    Wt Readings from Last 3 Encounters:  09/03/20 246 lb 9.6 oz (111.9 kg)  05/28/20 246 lb (111.6 kg)    09/05/19 247 lb (112 kg)     Kidney Function Lab Results  Component Value Date/Time   CREATININE 1.30 05/28/2020 07:46 AM   CREATININE 1.25 10/10/2018 08:30 AM   GFR 66.81 05/28/2020 07:46 AM   GFRNONAA >60 05/25/2011 04:49 AM   GFRAA >60 05/25/2011 04:49 AM    BMP Latest Ref Rng & Units 05/28/2020 10/10/2018 02/02/2016  Glucose 70 - 99 mg/dL 110(H) 363(H) 203(H)  BUN 6 - 23 mg/dL '19 12 13  ' Creatinine 0.40 - 1.50 mg/dL 1.30 1.25 1.34  Sodium 135 - 145 mEq/L 137 137 140  Potassium 3.5 - 5.1 mEq/L 4.8 4.2 5.4(H)  Chloride 96 - 112 mEq/L 103 100 104  CO2 19 - 32 mEq/L '28 29 30  ' Calcium 8.4 - 10.5 mg/dL 10.0 9.6 9.8    . Current antihypertensive regimen:  o Diltiazem 120 mg  . How often are you checking your Blood Pressure? 3-5x per week . Current home BP readings: 140/60 done 10-12-2020 . What recent interventions/DTPs have been made by any provider to improve Blood Pressure control since last CPP Visit: None . Any recent hospitalizations or ED visits since last visit with CPP? No . What diet changes have been made to improve Blood Pressure Control?  o He states he cut back on his sugar intake. . What exercise is being done to improve your Blood Pressure Control?  o He is exercising  times a week for about  an hour.  Adherence Review: Is the patient currently on ACE/ARB medication? No Does the patient have >5 day gap between last estimated fill dates? No  Recent Relevant Labs: Lab Results  Component Value Date/Time   HGBA1C 7.1 (A) 09/03/2020 07:56 AM   HGBA1C 7.8 (H) 05/28/2020 07:46 AM   HGBA1C 8.0 (H) 10/09/2019 07:20 AM   HGBA1C 9.5 (A) 01/10/2019 08:30 AM   MICROALBUR 3.1 (H) 10/10/2018 08:30 AM   MICROALBUR 7.0 (H) 02/02/2016 08:48 AM    Kidney Function Lab Results  Component Value Date/Time   CREATININE 1.30 05/28/2020 07:46 AM   CREATININE 1.25 10/10/2018 08:30 AM   GFR 66.81 05/28/2020 07:46 AM   GFRNONAA >60 05/25/2011 04:49 AM   GFRAA >60 05/25/2011  04:49 AM    . Current antihyperglycemic regimen:  o Empagliflozin (JARDIANCE) 25 MG tablet o Glipizide (GLUCOTROL XL) 10 MG 24 hrs tablet o Metformin (GLUCOPHAGE) 1000 MG tablet . What recent interventions/DTPs have been made to improve glycemic control:  o None . Have there been any recent hospitalizations or ED visits since last visit with CPP? No . Patient denies hypoglycemic symptoms, including Sweaty, Shaky, Hungry, Nervous/irritable and Vision changes . Patient denies hyperglycemic symptoms, including blurry vision, excessive thirst, fatigue, polyuria and weakness . How often are you checking your blood sugar? once daily . What are your blood sugars ranging?  o Fasting:  140 o Before meals: N/A o After meals: N/A o Bedtime: N/A . During the week, how often does your blood glucose drop below 70? Never Are you checking your feet daily/regularly?  Yes  Adherence Review: Is the patient currently on a STATIN medication? Yes Does the patient have >5 day gap between last estimated fill dates? No  Follow-Up:  Pharmacist Review   He states he has been doing well since his visit. He says he has increased his exercise to three times a week. He states he is doing his best to keep his A1C down and maybe lose a little. He denies any side effects with his medication. Also, he denies any problems with his current pharmacy.  Maia Breslow, [place] Assistant [phone]

## 2020-10-12 DIAGNOSIS — H2513 Age-related nuclear cataract, bilateral: Secondary | ICD-10-CM | POA: Diagnosis not present

## 2020-10-12 DIAGNOSIS — H35033 Hypertensive retinopathy, bilateral: Secondary | ICD-10-CM | POA: Diagnosis not present

## 2020-10-12 DIAGNOSIS — E119 Type 2 diabetes mellitus without complications: Secondary | ICD-10-CM | POA: Diagnosis not present

## 2020-10-12 LAB — HM DIABETES EYE EXAM

## 2020-10-15 ENCOUNTER — Encounter: Payer: Self-pay | Admitting: Adult Health

## 2020-10-27 ENCOUNTER — Other Ambulatory Visit: Payer: Self-pay | Admitting: Adult Health

## 2020-10-27 DIAGNOSIS — E1169 Type 2 diabetes mellitus with other specified complication: Secondary | ICD-10-CM

## 2020-11-16 ENCOUNTER — Other Ambulatory Visit: Payer: Self-pay | Admitting: Adult Health

## 2020-11-16 DIAGNOSIS — E1169 Type 2 diabetes mellitus with other specified complication: Secondary | ICD-10-CM

## 2020-11-17 ENCOUNTER — Ambulatory Visit: Payer: Medicare Other | Admitting: Pharmacist

## 2020-11-17 DIAGNOSIS — I10 Essential (primary) hypertension: Secondary | ICD-10-CM

## 2020-11-17 DIAGNOSIS — E119 Type 2 diabetes mellitus without complications: Secondary | ICD-10-CM

## 2020-11-17 NOTE — Chronic Care Management (AMB) (Signed)
Chronic Care Management Pharmacy  Name: Jason [name]  MRN: [id number] DOB: [birth year]  Initial Questions: 1. Have you seen any other providers since your last visit? Yes -PCP visit in September 2. Any changes in your medicines or health? No   Chief Complaint/ HPI  Jason [name],  64 y.o. , male presents for their Follow-Up CCM visit with the clinical pharmacist via telephone due to COVID-19 Pandemic.  PCP : [name], NP  Their chronic conditions include: DM, HTN  Office Visits:   09/07/20 [name], LPN: Patient presented for medicare annual wellness exam.  09/03/20 [name], NP: Patient presented for DM follow up. A1c improved to 7.1%. Influenza immunization given. Follow up in 3 months.  6/17/2021[name], NP- Patient presented for annual exam. Today's A1c: 7.8 (slightly improved from 09/2019). TSH, PSA, CMP & CBC all WNL. Rosuvastatin 5 mg added due to DM diagnosis and ASCVD risk of 37%. Follow up in 3 months for DM and HTN.  Medications: Outpatient Encounter Medications as of 11/17/2020  Medication Sig  . Blood Glucose Monitoring Suppl (ONE TOUCH ULTRA 2) w/Device KIT Use the device daily to check your blood sugars 1-4 times as instructed.  . diltiazem (CARDIZEM CD) 120 MG 24 hr capsule TAKE 1 CAPSULE BY MOUTH EVERY DAY  . empagliflozin (JARDIANCE) 25 MG TABS tablet Take 1 tablet (25 mg total) by mouth daily.  Marland Kitchen glipiZIDE (GLUCOTROL XL) 10 MG 24 hr tablet TAKE 1 TABLET BY MOUTH DAILY WITH BREAKFAST  . glucose blood (ONE TOUCH ULTRA TEST) test strip USE TO TEST BLOOD GLUCOSE TWICE DAILY  . Lancets (ONETOUCH ULTRASOFT) lancets Use to check blood sugar BID  . metFORMIN (GLUCOPHAGE) 1000 MG tablet TAKE 1 TABLET BY MOUTH TWICE A DAY WITH FOOD  . rosuvastatin (CRESTOR) 5 MG tablet Take 1 tablet (5 mg total) by mouth at bedtime.   No facility-administered encounter medications on file as of 11/17/2020.    Current Diagnosis/Assessment:  Goals  Addressed            This Visit's Progress   . Pharmacy Care Plan       CARE PLAN ENTRY  Current Barriers:  . Chronic Disease Management support, education, and care coordination needs related to Hypertension and Diabetes   Hypertension . Pharmacist Clinical Goal(s): o Over the next 30 days, patient will work with PharmD and providers to maintain BP goal <130/80 . Current regimen:  o Diltiazem (Cardizem CD) 1100m, 1 capsule once daily  . Interventions: . Discussed diet modifications. DASH diet:  following a diet emphasizing fruits and vegetables and low-fat dairy products along with whole grains, fish, poultry, and nuts. Reducing red meats and sugars.  . Patient self care activities - Over the next 30 days, patient will: o Check BP at least once weekly, document, and provide at future appointments. o Ensure daily salt intake < 2300 mg/day  Diabetes . Pharmacist Clinical Goal(s): o Over the next 90 days, patient will work with PharmD and providers to achieve A1c goal <7% . Current regimen:   empagliflozin (Jardiance) 242m 1 tablet once daily   Glipizide (Glucotrol XL) 1019m1 tablet once daily with breakfast  Metformin 1000m54m tablet once daily with a meal . Patient self care activities - Over the next 90 days, patient will: o Check blood sugar as directed, document, and provide at future appointments o Contact provider with any episodes of hypoglycemia o Read nutrition labels on food and aim for 45 - 60 grams  of carbs per meal  Hyperlipidemia . Pharmacist Clinical Goal(s): o Over the next 90 days, patient will work with PharmD and providers to achieve LDL goal < 70 . Current regimen:  o Rosuvastatin 5 mg, 1 tablet once daily  . Interventions: . Discussed diet modifications . Recommend repeat lipid panel . Patient self care activities - Over the next 90 days, patient will: o Continue to work on diet and exercise modifications to lower cholesterol.  Medication  management . Pharmacist Clinical Goal(s): o Over the next 90 days, patient will work with PharmD and providers to maintain optimal medication adherence . [facility] . Interventions o Comprehensive medication review performed. o Continue current medication management strategy . Patient self care activities - Over the next 90 days, patient will: o Take medications as prescribed o Report any questions or concerns to PharmD and/or provider(s)  Please see past updates related to this goal by clicking on the "Past Updates" button in the selected goal        SDOH Interventions     Most Recent Value  SDOH Interventions  Financial Strain Interventions Intervention Not Indicated  Transportation Interventions Intervention Not Indicated      Diabetes  A1c goal < 7%  Recent Relevant Labs: Lab Results  Component Value Date/Time   HGBA1C 7.1 (A) 09/03/2020 07:56 AM   HGBA1C 7.8 (H) 05/28/2020 07:46 AM   HGBA1C 8.0 (H) 10/09/2019 07:20 AM   HGBA1C 9.5 (A) 01/10/2019 08:30 AM   MICROALBUR 3.1 (H) 10/10/2018 08:30 AM   MICROALBUR 7.0 (H) 02/02/2016 08:48 AM     Checking BG: Daily  Recent BG Readings: 99-126  Patient has failed these meds in past: none   Patient is currently uncontrolled on the following medications:   empagliflozin (Jardiance) 57m, 1 tablet once daily   Glipizide (Glucotrol XL) 19m 1 tablet once daily with breakfast  Metformin 100041m1 tablet twice daily with a meal (patient reports taking once daily - states under agreeance with CorTommi Rumpsowever no chart documentation found).   Last diabetic Eye exam:  Lab Results  Component Value Date/Time   HMDIABEYEEXA No Retinopathy 10/12/2020 12:00 AM    - patient reports obtaining yearly eye exams and got one this fall and everything went well - nothing new to report  Last diabetic Foot exam: No results found for: HMDIABFOOTEX  - patient reports some tingling. But reports improved than before.   We  discussed: diet and exercise extensively - diet: patient has backed off on sweets and is maintaining his weight - exercise: patient reports still walking almost every day  Plan Continue current medications   Hypertension  Denies dizziness/lightheadedness.  Denies persistent HA/chest pain.  Goal BP is <140/90  Office blood pressures are  BP Readings from Last 3 Encounters:  09/03/20 138/88  05/28/20 (!) 160/94  05/30/19 138/84   Patient has failed these meds in the past: none   Patient checks BP at home 1-2x per week  BP: 148/88 (wasn't sure of exact readings)    Patient is controlled on:   Diltiazem (Cardizem CD) 120m78m capsule once daily   We discussed:  . Diet: patient has cut out some sodium from his diet . Patient reports his BP may have gone up some lately  Plan Continue current medications  Continue checking BP 1-2 times weekly and plan for BP assessment in ~1 month.      Hyperlipidemia   LDL goal < 70  Lipid Panel  Component Value Date/Time   CHOL 156 05/28/2020 0746   TRIG 74.0 05/28/2020 0746   HDL 62.10 05/28/2020 0746   LDLCALC 79 05/28/2020 0746    Hepatic Function Latest Ref Rng & Units 05/28/2020 10/10/2018 02/02/2016  Total Protein 6.0 - 8.3 g/dL 7.8 7.4 7.4  Albumin 3.5 - 5.2 g/dL 4.1 4.1 4.3  AST 0 - 37 U/L _0 ALT 0 - 53 U/L 31 33 37  Alk Phosphatase 39 - 117 U/L 73 74 73  Total Bilirubin 0.2 - 1.2 mg/dL 0.3 0.4 0.4  Bilirubin, Direct 0.0 - 0.3 mg/dL - 0.1 -     The ASCVD Risk score Mikey Bussing DC Jr., et al., 2013) failed to calculate for the following reasons:   The systolic blood pressure is missing   Patient has failed these meds in past: none Patient is currently uncontrolled on the following medications:  . Rosuvastatin 5 mg 1 tablet daily   We discussed:  diet and exercise extensively  Plan Continue current medications  Recommend repeat lipid panel to ensure adequate LDL lowering.  Vaccines   Reviewed and discussed  patient's vaccination history.    Immunization History  Administered Date(s) Administered  . Fluad Quad(high Dose 65+) 09/11/2019, 09/03/2020  . Influenza Split 09/12/2012  . Influenza Whole 12/24/2007  . Influenza,inj,Quad PF,6+ Mos 02/02/2016, 10/10/2018  . PFIZER SARS-COV-2 Vaccination 02/24/2020, 03/16/2020  . Pneumococcal Conjugate-13 09/11/2019  . Pneumococcal Polysaccharide-23 02/25/2016  . Td 01/31/2001  . Tdap 02/02/2016    Patient reports he will try to call tomorrow to get this set up at his pharmacy.  Plan  Recommended patient receive Shingrix vaccine at pharmacy.   Medication Management   [facility]  [id number] [place], [place] [address]Phone: [phone]: [phone] Uses pill box? No - patient reports routine; and taking all meds once a day in the morning. Pt endorses 100% compliance  We discussed: Current pharmacy is preferred with insurance plan and patient is satisfied with pharmacy services  Plan  Continue current medication management strategy   Follow up: 4 month phone visit 1 month CPA BP assessment  MJeni Salles PharmD, BHarriettaPharmacist LPecan Gapat [place]

## 2020-11-17 NOTE — Patient Instructions (Signed)
  Visit Information  Goals Addressed            This Visit's Progress   . Pharmacy Care Plan       CARE PLAN ENTRY  Current Barriers:  . Chronic Disease Management support, education, and care coordination needs related to Hypertension and Diabetes   Hypertension . Pharmacist Clinical Goal(s): o Over the next 30 days, patient will work with PharmD and providers to maintain BP goal <130/80 . Current regimen:  o Diltiazem (Cardizem CD) 120mg , 1 capsule once daily  . Interventions: . Discussed diet modifications. DASH diet:  following a diet emphasizing fruits and vegetables and low-fat dairy products along with whole grains, fish, poultry, and nuts. Reducing red meats and sugars.  . Patient self care activities - Over the next 30 days, patient will: o Check BP at least once weekly, document, and provide at future appointments. o Ensure daily salt intake < 2300 mg/day  Diabetes . Pharmacist Clinical Goal(s): o Over the next 90 days, patient will work with PharmD and providers to achieve A1c goal <7% . Current regimen:   empagliflozin (Jardiance) 25mg , 1 tablet once daily   Glipizide (Glucotrol XL) 10mg , 1 tablet once daily with breakfast  Metformin 1000mg , 1 tablet once daily with a meal . Patient self care activities - Over the next 90 days, patient will: o Check blood sugar as directed, document, and provide at future appointments o Contact provider with any episodes of hypoglycemia o Read nutrition labels on food and aim for 45 - 60 grams of carbs per meal  Hyperlipidemia . Pharmacist Clinical Goal(s): o Over the next 90 days, patient will work with PharmD and providers to achieve LDL goal < 70 . Current regimen:  o Rosuvastatin 5 mg, 1 tablet once daily  . Interventions: . Discussed diet modifications . Recommend repeat lipid panel . Patient self care activities - Over the next 90 days, patient will: o Continue to work on diet and exercise modifications to lower  cholesterol.  Medication management . Pharmacist Clinical Goal(s): o Over the next 90 days, patient will work with PharmD and providers to maintain optimal medication adherence . [facility] . Interventions o Comprehensive medication review performed. o Continue current medication management strategy . Patient self care activities - Over the next 90 days, patient will: o Take medications as prescribed o Report any questions or concerns to PharmD and/or provider(s)  Please see past updates related to this goal by clicking on the "Past Updates" button in the selected goal         The patient verbalized understanding of instructions, educational materials, and care plan provided today and declined offer to receive copy of patient instructions, educational materials, and care plan.   Telephone follow up appointment with pharmacy team member scheduled for: 4 months  [name], [facility]

## 2020-12-03 ENCOUNTER — Ambulatory Visit (INDEPENDENT_AMBULATORY_CARE_PROVIDER_SITE_OTHER): Payer: Medicare Other | Admitting: Adult Health

## 2020-12-03 ENCOUNTER — Other Ambulatory Visit: Payer: Self-pay

## 2020-12-03 ENCOUNTER — Encounter: Payer: Self-pay | Admitting: Adult Health

## 2020-12-03 DIAGNOSIS — E1169 Type 2 diabetes mellitus with other specified complication: Secondary | ICD-10-CM

## 2020-12-03 LAB — POCT GLYCOSYLATED HEMOGLOBIN (HGB A1C): Hemoglobin A1C: 7.2 % — AB (ref 4.0–5.6)

## 2020-12-03 MED ORDER — EMPAGLIFLOZIN 25 MG PO TABS: 25.0000 mg | ORAL_TABLET | Freq: Every day | ORAL | 0 refills | Status: DC

## 2020-12-03 NOTE — Progress Notes (Signed)
Subjective:    Patient ID: Jason [name], male    DOB: December 11, 1956, 64 y.o.   MRN: [id number]  HPI 64 year old male who  has a past medical history of Bell palsy, Diabetes mellitus without complication ([place]), HTN (hypertension), and Hyperglycemia.  He presents to the office today for 56-monthfollow-up regarding diabetes.  Is currently prescribed glipizide 10 mg ER daily, Metformin 1000 mg daily, and Jardiance 25 mg daily.  Check his blood sugars at home intermittently and per his log have been between 100-170's  with most readings being in the 130s to 140s. He continues to work on diet and exercise. He has not had any hypoglycemic events. Reports " I feel really good"   Lab Results  Component Value Date   HGBA1C 7.1 (A) 09/03/2020    Wt Readings from Last 3 Encounters:  12/03/20 245 lb 12.8 oz (111.5 kg)  09/03/20 246 lb 9.6 oz (111.9 kg)  05/28/20 246 lb (111.6 kg)    Review of Systems See HPI   Past Medical History:  Diagnosis Date  . Bell palsy   . Diabetes mellitus without complication ([place]   . HTN (hypertension)   . Hyperglycemia     Social History   Socioeconomic History  . Marital status: Married    Spouse name: Not on file  . Number of children: 2  . Years of education: 111 . Highest education level: Not on file  Occupational History  . Occupation: [phone]-[id number]    Employer: [employer]  . Occupation: Truck DGeophysicist/field seismologist Tobacco Use  . Smoking status: Never Smoker  . Smokeless tobacco: Never Used  Vaping Use  . Vaping Use: Never used  Substance and Sexual Activity  . Alcohol use: Yes    Alcohol/week: 6.0 standard drinks    Types: 6 Cans of beer per week    Comment: on weekends  . Drug use: No  . Sexual activity: Not on file  Other Topics Concern  . 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       Fun: fix cars, drag race    Social Determinants of Health    Financial Resource Strain: Low Risk   . Difficulty of Paying Living Expenses: Not hard at all  Food Insecurity: No Food Insecurity  . Worried About RCharity fundraiserin the Last Year: Never true  . Ran Out of Food in the Last Year: Never true  Transportation Needs: No Transportation Needs  . Lack of Transportation (Medical): No  . Lack of Transportation (Non-Medical): No  Physical Activity: Insufficiently Active  . Days of Exercise per Week: 3 days  . Minutes of Exercise per Session: 30 min  Stress: No Stress Concern Present  . Feeling of Stress : Not at all  Social Connections: Moderately Isolated  . Frequency of Communication with Friends and Family: More than three times a week  . Frequency of Social Gatherings with Friends and Family: More than three times a week  . Attends Religious Services: More than 4 times per year  . Active Member of Clubs or Organizations: No  . Attends CArchivistMeetings: Never  . Marital Status: Widowed  Intimate Partner Violence: Not At Risk  . Fear of Current or Ex-Partner: No  . Emotionally Abused: No  . Physically Abused: No  . Sexually Abused: No  Past Surgical History:  Procedure Laterality Date  . bellpalsy    . COLONOSCOPY    . COLONOSCOPY W/ POLYPECTOMY      Family History  Problem Relation Age of Onset  . Stroke Mother   . Colon cancer Brother   . High blood pressure Brother   . Stomach cancer Brother   . Colon cancer Other        1st degree relative <60  . Diabetes Other        1st degree relative   . Stroke Other   . Colon cancer Sister   . High blood pressure Sister   . Esophageal cancer Neg Hx   . Rectal cancer Neg Hx     Allergies  Allergen Reactions  . Lisinopril     hyperkalemia    Current Outpatient Medications on File Prior to Visit  Medication Sig Dispense Refill  . Blood Glucose Monitoring Suppl (ONE TOUCH ULTRA 2) w/Device KIT Use the device daily to check your blood sugars 1-4 times as  instructed. 1 kit 0  . diltiazem (CARDIZEM CD) 120 MG 24 hr capsule TAKE 1 CAPSULE BY MOUTH EVERY DAY 90 capsule 3  . empagliflozin (JARDIANCE) 25 MG TABS tablet Take 1 tablet (25 mg total) by mouth daily. 90 tablet 0  . glipiZIDE (GLUCOTROL XL) 10 MG 24 hr tablet TAKE 1 TABLET BY MOUTH DAILY WITH BREAKFAST 90 tablet 0  . glucose blood (ONE TOUCH ULTRA TEST) test strip USE TO TEST BLOOD GLUCOSE TWICE DAILY 200 each 3  . Lancets (ONETOUCH ULTRASOFT) lancets Use to check blood sugar BID 100 each 12  . metFORMIN (GLUCOPHAGE) 1000 MG tablet TAKE 1 TABLET BY MOUTH TWICE A DAY WITH FOOD 180 tablet 0  . rosuvastatin (CRESTOR) 5 MG tablet Take 1 tablet (5 mg total) by mouth at bedtime. 90 tablet 3   No current facility-administered medications on file prior to visit.    BP (!) 132/98 (BP Location: Left Arm, Patient Position: Sitting, Cuff Size: Large)   Pulse 76   Temp 98.4 F (36.9 C) (Oral)   Ht 6' 3" (1.905 m)   Wt 245 lb 12.8 oz (111.5 kg)   SpO2 98%   BMI 30.72 kg/m       Objective:   Physical Exam Vitals and nursing note reviewed.  Constitutional:      Appearance: Normal appearance.  Skin:    General: Skin is warm and dry.     Capillary Refill: Capillary refill takes less than 2 seconds.  Neurological:     General: No focal deficit present.     Mental Status: He is alert and oriented to person, place, and time.  Psychiatric:        Mood and Affect: Mood normal.        Behavior: Behavior normal.        Thought Content: Thought content normal.        Judgment: Judgment normal.       Assessment & Plan:  1. Type 2 diabetes mellitus with other specified complication, without long-term current use of insulin (HCC) - empagliflozin (JARDIANCE) 25 MG TABS tablet; Take 1 tablet (25 mg total) by mouth daily.  Dispense: 90 tablet; Refill: 0 - POC HgB A1c- 7.2 - No change in medications. Encouraged to cut back on carbs and sugars. Continue to exercise - Follow up in 3 months   [name]

## 2020-12-28 ENCOUNTER — Other Ambulatory Visit: Payer: Self-pay | Admitting: Adult Health

## 2020-12-28 DIAGNOSIS — E1169 Type 2 diabetes mellitus with other specified complication: Secondary | ICD-10-CM

## 2020-12-29 NOTE — Telephone Encounter (Signed)
PHARMACY NEEDED DX CODE.  WAS NOT REQUESTING A REFILL.

## 2020-12-31 DIAGNOSIS — R059 Cough, unspecified: Secondary | ICD-10-CM | POA: Diagnosis not present

## 2021-01-28 ENCOUNTER — Other Ambulatory Visit: Payer: Self-pay | Admitting: Adult Health

## 2021-01-28 DIAGNOSIS — E1169 Type 2 diabetes mellitus with other specified complication: Secondary | ICD-10-CM

## 2021-01-28 NOTE — Telephone Encounter (Signed)
Sent to the pharmacy by e-scribe.  Pt has an upcoming appt for a1c.

## 2021-03-03 ENCOUNTER — Other Ambulatory Visit: Payer: Self-pay

## 2021-03-04 ENCOUNTER — Encounter: Payer: Self-pay | Admitting: Adult Health

## 2021-03-04 ENCOUNTER — Ambulatory Visit (INDEPENDENT_AMBULATORY_CARE_PROVIDER_SITE_OTHER): Payer: Medicare Other | Admitting: Adult Health

## 2021-03-04 VITALS — BP 124/82 | HR 83 | Temp 98.1°F | Wt 251.4 lb

## 2021-03-04 DIAGNOSIS — E1169 Type 2 diabetes mellitus with other specified complication: Secondary | ICD-10-CM | POA: Diagnosis not present

## 2021-03-04 DIAGNOSIS — I10 Essential (primary) hypertension: Secondary | ICD-10-CM | POA: Diagnosis not present

## 2021-03-04 LAB — POCT GLYCOSYLATED HEMOGLOBIN (HGB A1C): Hemoglobin A1C: 7.2 % — AB (ref 4.0–5.6)

## 2021-03-04 NOTE — Progress Notes (Signed)
Subjective:    Patient ID: Jason [name], male    DOB: 04/10/1956, 65 y.o.   MRN: [id number]  HPI 65 year old male who  has a past medical history of Bell palsy, Diabetes mellitus without complication ([place]), HTN (hypertension), and Hyperglycemia.  He presents to the office today for three month follow up regarding DM and HTN   DM-he presents to the office today for 96-monthfollow-up regarding diabetes.  He is currently prescribed glipizide 10 mg ER daily, Metformin 1000 mg daily, and Jardiance 25 mg daily.  He does check his blood sugars at home periodically and reports readings between 100-160 felt of his readings being in the 130s to 140s.  He denies any hypoglycemic events.  His last A1c in December 2021 was 7.2, this was up slightly from 7.1.  He was encouraged lifestyle modifications instead of increasing any of his medications. He is walking and working on cutting back carbs and sugars.    Lab Results  Component Value Date   HGBA1C 7.2 (A) 03/04/2021   HTN-currently prescribed Cardizem 180 mg.  He does not monitor his blood pressure at home.  He denies dizziness, lightheadedness, chest pain, or shortness of breath.  BP Readings from Last 3 Encounters:  03/04/21 124/82  12/03/20 (!) 132/98  09/03/20 138/88   Review of Systems See HPI   Past Medical History:  Diagnosis Date  . Bell palsy   . [place]   . HTN (hypertension)   . Hyperglycemia     Social History   Socioeconomic History  . Marital status: Married    Spouse name: Not on file  . Number of children: 2  . Years of education: 119 . Highest education level: Not on file  Occupational History  . Occupation: [phone]    Employer: [employer]  . Occupation: Truck DGeophysicist/field seismologist Tobacco Use  . Smoking status: Never Smoker  . Smokeless tobacco: Never Used  Vaping Use  . Vaping Use: Never used  Substance and Sexual Activity  . Alcohol use: Yes    Alcohol/week:  6.0 standard drinks    Types: 6 Cans of beer per week    Comment: on weekends  . Drug use: No  . Sexual activity: Not on file  Other Topics Concern  . 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       Fun: fix cars, drag race    Social Determinants of Health   Financial Resource Strain: Low Risk   . Difficulty of Paying Living Expenses: Not hard at all  Food Insecurity: No Food Insecurity  . Worried About RCharity fundraiserin the Last Year: Never true  . Ran Out of Food in the Last Year: Never true  Transportation Needs: No Transportation Needs  . Lack of Transportation (Medical): No  . Lack of Transportation (Non-Medical): No  Physical Activity: Insufficiently Active  . Days of Exercise per Week: 3 days  . Minutes of Exercise per Session: 30 min  Stress: No Stress Concern Present  . Feeling of Stress : Not at all  Social Connections: Moderately Isolated  . Frequency of Communication with Friends and Family: More than three times a week  . Frequency of Social Gatherings with Friends and Family: More than three times a week  . Attends Religious Services: More than 4  times per year  . Active Member of Clubs or Organizations: No  . Attends Archivist Meetings: Never  . Marital Status: Widowed  Intimate Partner Violence: Not At Risk  . Fear of Current or Ex-Partner: No  . Emotionally Abused: No  . Physically Abused: No  . Sexually Abused: No    Past Surgical History:  Procedure Laterality Date  . bellpalsy    . COLONOSCOPY    . COLONOSCOPY W/ POLYPECTOMY      Family History  Problem Relation Age of Onset  . Stroke Mother   . Colon cancer Brother   . High blood pressure Brother   . Stomach cancer Brother   . Colon cancer Other        1st degree relative <60  . Diabetes Other        1st degree relative   . Stroke Other   . Colon cancer Sister   . High blood pressure  Sister   . Esophageal cancer Neg Hx   . Rectal cancer Neg Hx     Allergies  Allergen Reactions  . Lisinopril     hyperkalemia    Current Outpatient Medications on File Prior to Visit  Medication Sig Dispense Refill  . Blood Glucose Monitoring Suppl (ONE TOUCH ULTRA 2) w/Device KIT Use the device daily to check your blood sugars 1-4 times as instructed. 1 kit 0  . diltiazem (CARDIZEM CD) 120 MG 24 hr capsule TAKE 1 CAPSULE BY MOUTH EVERY DAY 90 capsule 3  . empagliflozin (JARDIANCE) 25 MG TABS tablet Take 1 tablet (25 mg total) by mouth daily. 90 tablet 0  . glipiZIDE (GLUCOTROL XL) 10 MG 24 hr tablet TAKE 1 TABLET BY MOUTH DAILY WITH BREAKFAST 90 tablet 0  . glucose blood (ONE TOUCH ULTRA TEST) test strip USE TO TEST BLOOD GLUCOSE TWICE DAILY 200 each 3  . Lancets (ONETOUCH ULTRASOFT) lancets Use to check blood sugar BID 100 each 12  . metFORMIN (GLUCOPHAGE) 1000 MG tablet TAKE 1 TABLET BY MOUTH TWICE A DAY WITH FOOD 180 tablet 0  . rosuvastatin (CRESTOR) 5 MG tablet Take 1 tablet (5 mg total) by mouth at bedtime. 90 tablet 3   No current facility-administered medications on file prior to visit.    BP 124/82 (BP Location: Left Arm, Patient Position: Sitting, Cuff Size: Large)   Pulse 83   Temp 98.1 F (36.7 C) (Oral)   Wt 251 lb 6.4 oz (114 kg)   SpO2 95%   BMI 31.42 kg/m       Objective:   Physical Exam Vitals and nursing note reviewed.  Constitutional:      Appearance: Normal appearance.  Cardiovascular:     Rate and Rhythm: Normal rate and regular rhythm.     Pulses: Normal pulses.     Heart sounds: Normal heart sounds.  Pulmonary:     Effort: Pulmonary effort is normal.     Breath sounds: Normal breath sounds.  Musculoskeletal:        General: Normal range of motion.  Skin:    General: Skin is warm and dry.     Capillary Refill: Capillary refill takes less than 2 seconds.  Neurological:     General: No focal deficit present.     Mental Status: He is alert  and oriented to person, place, and time.  Psychiatric:        Mood and Affect: Mood normal.        Behavior: Behavior normal.  Thought Content: Thought content normal.        Judgment: Judgment normal.       Assessment & Plan:  1. Type 2 diabetes mellitus with other specified complication, without long-term current use of insulin (HCC)  - POCT glycosylated hemoglobin (Hb A1C)- 7.2 - no change - Continue with lifestyle modifications  - Follow up in three months for CPE   2. Essential hypertension - At goal  - No change in medications   [name], NP

## 2021-03-04 NOTE — Patient Instructions (Signed)
It was great seeing you today   Your A1c was 7.2 - this stayed the same. Work on doing more aerobic exercise and drinking more water  Schedule your physical exam for the end of June/Early July and we will retest then

## 2021-03-16 ENCOUNTER — Telehealth: Payer: Self-pay | Admitting: Pharmacist

## 2021-03-16 NOTE — Chronic Care Management (AMB) (Signed)
I left the patient a message about his upcoming appointment on 03/17/2021  @ 8:30 am with the clinical pharmacist. He was asked to please have all medication on hand to review with the pharmacist.   Neita Goodnight) Mare Ferrari, [place] [phone]

## 2021-03-16 NOTE — Progress Notes (Deleted)
Chronic Care Management Pharmacy Note  03/16/2021 Name:  Jason [name] MRN:  [id number] DOB:  25-Dec-1955  Subjective: Jason [name] is an 65 y.o. year old male who is a primary patient of [name], NP.  The CCM team was consulted for assistance with disease management and care coordination needs.    Engaged with patient by telephone for follow up visit in response to provider referral for pharmacy case management and/or care coordination services.   Consent to Services:  The patient was given information about Chronic Care Management services, agreed to services, and gave verbal consent prior to initiation of services.  Please see initial visit note for detailed documentation.   Patient Care Team: [name], NP as PCP - General (Family Medicine) [name], [facility] as Pharmacist (Pharmacist)  Recent office visits: 03/04/21 [name], NP: Patient presented for DM follow up. No change in A1c at 7.2%. No change in medications.  12/03/20 [name], NP: Patient presented for annual exam. A1c 7.2%. No changes made.  09/07/20 [name], LPN: Patient presented for medicare annual wellness exam.  09/03/20 [name], NP: Patient presented for DM follow up. A1c improved to 7.1%. Influenza immunization given. Follow up in 3 months.  Recent consult visits: 10/12/20 Gala Romney (ophthalmology): Unable to access notes.  Hospital visits: None in previous 6 months  Objective:  Lab Results  Component Value Date   CREATININE 1.30 05/28/2020   BUN 19 05/28/2020   GFR 66.81 05/28/2020   GFRNONAA >60 05/25/2011   GFRAA >60 05/25/2011   NA 137 05/28/2020   K 4.8 05/28/2020   CALCIUM 10.0 05/28/2020   CO2 28 05/28/2020   GLUCOSE 110 (H) 05/28/2020    Lab Results  Component Value Date/Time   HGBA1C 7.2 (A) 03/04/2021 07:59 AM   HGBA1C 7.2 (A) 12/03/2020 08:45 AM   HGBA1C 7.8 (H) 05/28/2020 07:46 AM   HGBA1C 8.0 (H) 10/09/2019 07:20 AM   HGBA1C 9.5  (A) 01/10/2019 08:30 AM   GFR 66.81 05/28/2020 07:46 AM   GFR 74.67 10/10/2018 08:30 AM   MICROALBUR 3.1 (H) 10/10/2018 08:30 AM   MICROALBUR 7.0 (H) 02/02/2016 08:48 AM    Last diabetic Eye exam:  Lab Results  Component Value Date/Time   HMDIABEYEEXA No Retinopathy 10/12/2020 12:00 AM    Last diabetic Foot exam: No results found for: HMDIABFOOTEX   Lab Results  Component Value Date   CHOL 156 05/28/2020   HDL 62.10 05/28/2020   LDLCALC 79 05/28/2020   TRIG 74.0 05/28/2020   CHOLHDL 3 05/28/2020    Hepatic Function Latest Ref Rng & Units 05/28/2020 10/10/2018 02/02/2016  Total Protein 6.0 - 8.3 g/dL 7.8 7.4 7.4  Albumin 3.5 - 5.2 g/dL 4.1 4.1 4.3  AST 0 - 37 U/L '21 22 23  ' ALT 0 - 53 U/L 31 33 37  Alk Phosphatase 39 - 117 U/L 73 74 73  Total Bilirubin 0.2 - 1.2 mg/dL 0.3 0.4 0.4  Bilirubin, Direct 0.0 - 0.3 mg/dL - 0.1 -    Lab Results  Component Value Date/Time   TSH 2.96 05/28/2020 07:46 AM   TSH 2.73 10/10/2018 08:30 AM   FREET4 0.92 05/24/2011 01:59 PM    CBC Latest Ref Rng & Units 05/28/2020 10/10/2018 02/02/2016  WBC 4.0 - 10.5 K/uL 6.5 5.0 5.9  Hemoglobin 13.0 - 17.0 g/dL 15.8 15.8 16.1  Hematocrit 39.0 - 52.0 % 46.9 46.7 47.9  Platelets 150.0 - 400.0 K/uL 223.0 150.0 178.0  No results found for: VD25OH  Clinical ASCVD: No  The 10-year ASCVD risk score Mikey Bussing DC Jr., et al., 2013) is: 24.7%   Values used to calculate the score:     Age: 78 years     Sex: Male     Is Non-Hispanic African American: Yes     Diabetic: Yes     Tobacco smoker: No     Systolic Blood Pressure: 950 mmHg     Is BP treated: Yes     HDL Cholesterol: 62.1 mg/dL     Total Cholesterol: 156 mg/dL    Depression screen [facility] 2/9 09/07/2020 09/05/2019 02/02/2016  Decreased Interest 0 0 0  Down, Depressed, Hopeless 0 0 0  PHQ - 2 Score 0 0 0  Altered sleeping 0 - -  Tired, decreased energy 0 - -  Change in appetite 0 - -  Feeling bad or failure about yourself  0 - -  Trouble  concentrating 0 - -  Moving slowly or fidgety/restless 0 - -  Suicidal thoughts 0 - -  PHQ-9 Score 0 - -  Difficult doing work/chores Not difficult at all - -      Social History   Tobacco Use  Smoking Status Never Smoker  Smokeless Tobacco Never Used   BP Readings from Last 3 Encounters:  03/04/21 124/82  12/03/20 (!) 132/98  09/03/20 138/88   Pulse Readings from Last 3 Encounters:  03/04/21 83  12/03/20 76  09/03/20 63   Wt Readings from Last 3 Encounters:  03/04/21 251 lb 6.4 oz (114 kg)  12/03/20 245 lb 12.8 oz (111.5 kg)  09/03/20 246 lb 9.6 oz (111.9 kg)   BMI Readings from Last 3 Encounters:  03/04/21 31.42 kg/m  12/03/20 30.72 kg/m  09/03/20 31.66 kg/m    Assessment/Interventions: Review of patient past medical history, allergies, medications, health status, including review of consultants reports, laboratory and other test data, was performed as part of comprehensive evaluation and provision of chronic care management services.   SDOH:  (Social Determinants of Health) assessments and interventions performed: No  SDOH Screenings   Alcohol Screen: Low Risk   . Last Alcohol Screening Score (AUDIT): 4  Depression (PHQ2-9): Low Risk   . PHQ-2 Score: 0  Financial Resource Strain: Low Risk   . Difficulty of Paying Living Expenses: Not hard at all  Food Insecurity: No Food Insecurity  . Worried About Charity fundraiser in the Last Year: Never true  . Ran Out of Food in the Last Year: Never true  Housing: Low Risk   . Last Housing Risk Score: 0  Physical Activity: Insufficiently Active  . Days of Exercise per Week: 3 days  . Minutes of Exercise per Session: 30 min  Social Connections: Moderately Isolated  . Frequency of Communication with Friends and Family: More than three times a week  . Frequency of Social Gatherings with Friends and Family: More than three times a week  . Attends Religious Services: More than 4 times per year  . Active Member of  Clubs or Organizations: No  . Attends Archivist Meetings: Never  . Marital Status: Widowed  Stress: No Stress Concern Present  . Feeling of Stress : Not at all  Tobacco Use: Low Risk   . Smoking Tobacco Use: Never Smoker  . Smokeless Tobacco Use: Never Used  Transportation Needs: No Transportation Needs  . Lack of Transportation (Medical): No  . Lack of Transportation (Non-Medical): No    CCM  Care Plan  Allergies  Allergen Reactions  . Lisinopril     hyperkalemia    Medications Reviewed Today    Reviewed by Rebecca Eaton, [place] (Certified Medical Assistant) on 03/04/21 at [phone]  Med List Status: <None>  Medication Order Taking? Sig Documenting Provider Last Dose Status Informant  Blood Glucose Monitoring Suppl (ONE TOUCH ULTRA 2) w/Device KIT [id number] Yes Use the device daily to check your blood sugars 1-4 times as instructed. Nafziger, Tommi Rumps, NP Taking Active   diltiazem (CARDIZEM CD) 120 MG 24 hr capsule [id number] Yes TAKE 1 CAPSULE BY MOUTH EVERY DAY Nafziger, Tommi Rumps, NP Taking Active   empagliflozin (JARDIANCE) 25 MG TABS tablet [id number] Yes Take 1 tablet (25 mg total) by mouth daily. Nafziger, Tommi Rumps, NP Taking Active   glipiZIDE (GLUCOTROL XL) 10 MG 24 hr tablet [id number] Yes TAKE 1 TABLET BY MOUTH DAILY WITH BREAKFAST Nafziger, Tommi Rumps, NP Taking Active   glucose blood (ONE TOUCH ULTRA TEST) test strip [id number] Yes USE TO TEST BLOOD GLUCOSE TWICE DAILY Nafziger, Tommi Rumps, NP Taking Active   Lancets [facility] ULTRASOFT) lancets [id number] Yes Use to check blood sugar BID Nafziger, Tommi Rumps, NP Taking Active   metFORMIN (GLUCOPHAGE) 1000 MG tablet [id number] Yes TAKE 1 TABLET BY MOUTH TWICE A DAY WITH FOOD Nafziger, Tommi Rumps, NP Taking Active   rosuvastatin (CRESTOR) 5 MG tablet [id number] Yes Take 1 tablet (5 mg total) by mouth at bedtime. [name], NP Taking Active           Patient Active Problem List   Diagnosis Date Noted  . Routine general medical examination at a  health care facility 02/02/2016  . Type 2 diabetes mellitus ([place]) 06/12/2012  . Syncope - neurally mediated 07/04/2011  . Essential hypertension 12/24/2007  . HYPERGLYCEMIA 12/24/2007    Immunization History  Administered Date(s) Administered  . Fluad Quad(high Dose 65+) 09/11/2019, 09/03/2020  . Influenza Split 09/12/2012  . Influenza Whole 12/24/2007  . Influenza,inj,Quad PF,6+ Mos 02/02/2016, 10/10/2018  . PFIZER(Purple Top)SARS-COV-2 Vaccination 02/24/2020, 03/16/2020, 02/25/2021  . Pneumococcal Conjugate-13 09/11/2019  . Pneumococcal Polysaccharide-23 02/25/2016  . Td 01/31/2001  . Tdap 02/02/2016  . Zoster Recombinat (Shingrix) 02/25/2021    Conditions to be addressed/monitored:  Hypertension, Hyperlipidemia and Diabetes  There are no care plans that you recently modified to display for this patient.   Current Barriers:  . {pharmacybarriers:24917} . ***  Pharmacist Clinical Goal(s):  Marland Kitchen Patient will {PHARMACYGOALCHOICES:24921} through collaboration with PharmD and provider.  . ***  Interventions: . 1:1 collaboration with [name], NP regarding development and update of comprehensive plan of care as evidenced by provider attestation and co-signature . Inter-disciplinary care team collaboration (see longitudinal plan of care) . Comprehensive medication review performed; medication list updated in electronic medical record   Hypertension (BP goal <130/80) -Not ideally controlled -Current treatment:  Diltiazem (Cardizem CD) 159m, 1 capsule once daily -Medications previously tried: none -Current home readings: *** -Current dietary habits: *** -Current exercise habits: *** -{ACTIONS;DENIES/REPORTS:21021675::"Denies"} hypotensive/hypertensive symptoms -Educated on {CCM BP Counseling:25124} -Counseled to monitor BP at home ***, document, and provide log at future appointments -{CCMPHARMDINTERVENTION:25122}   Hyperlipidemia: (LDL goal <  70) -Uncontrolled -Current treatment:  Rosuvastatin 5 mg 1 tablet daily  -Medications previously tried: none  -Current dietary patterns: *** -Current exercise habits: *** -Educated on {CCM HLD Counseling:25126} -{CCMPHARMDINTERVENTION:25122}  Repeat lipid panel planned for cpe in June/july  Diabetes (A1c goal <7%) -Uncontrolled -Current medications:  empagliflozin (Jardiance) 227m 1 tablet once daily   Glipizide (Glucotrol XL) 1023m1 tablet  once daily with breakfast  Metformin 1019m, 1 tablet twice daily with a meal -Medications previously tried: none -Current home glucose readings . fasting glucose: *** . post prandial glucose: *** -{ACTIONS;DENIES/REPORTS:21021675::"Denies"} hypoglycemic/hyperglycemic symptoms -Current meal patterns:  . breakfast: ***  . lunch: ***  . dinner: *** . snacks: *** . drinks: *** -Current exercise: *** -Educated on {CCM DM COUNSELING:25123} -Counseled to check feet daily and get yearly eye exams -{CCMPHARMDINTERVENTION:25122}   -still taking metformin (patient reports taking once daily - states under agreeance with CTommi Rumps however no chart documentation found). ? patient has backed off on sweets and is maintaining his weight - exercise: patient reports still walking almost every day   Health Maintenance -Vaccine gaps: Prevnar 20 or Pneumovax 23 -Current therapy:  . No medications -Educated on {ccm supplement counseling:25128} -{CCM Patient satisfied:25129} -{CCMPHARMDINTERVENTION:25122}   Patient Goals/Self-Care Activities . Patient will:  - {pharmacypatientgoals:24919}  Follow Up Plan: {CM FOLLOW UP PBHAL:93790}  Medication Assistance: {MEDASSISTANCEINFO:25044}  Patient's [facility]  [id number] [place] [id number]: [phone]: : [phone] pill box? No - patient reports routine; and taking all meds once a day in the  morning. Pt endorses 100% compliance  Metformin is not on dispense report??  We discussed: {Pharmacy options:24294} Patient decided to: {US Pharmacy PlQJJH:41740}Care Plan and Follow Up Patient Decision:  {FOLLOWUP:24991}  Plan: {CM FOLLOW UP PLCXKG:81856}MaJeni SallesPharmD BCSpearsvilleharmacist [place] Br[place]

## 2021-03-17 ENCOUNTER — Telehealth: Payer: Medicare Other

## 2021-03-18 ENCOUNTER — Ambulatory Visit (INDEPENDENT_AMBULATORY_CARE_PROVIDER_SITE_OTHER): Payer: Medicare Other | Admitting: Pharmacist

## 2021-03-18 DIAGNOSIS — I10 Essential (primary) hypertension: Secondary | ICD-10-CM

## 2021-03-18 DIAGNOSIS — E782 Mixed hyperlipidemia: Secondary | ICD-10-CM | POA: Diagnosis not present

## 2021-03-18 DIAGNOSIS — E1169 Type 2 diabetes mellitus with other specified complication: Secondary | ICD-10-CM

## 2021-03-18 NOTE — Progress Notes (Signed)
Chronic Care Management Pharmacy Note  04/01/2021 Name:  Jason [name] MRN:  [id number] DOB:  [birth year]  Subjective: Jason [name] is an 65 y.o. year old male who is a primary patient of [name], NP.  The CCM team was consulted for assistance with disease management and care coordination needs.    Engaged with patient by telephone for follow up visit in response to provider referral for pharmacy case management and/or care coordination services.   Consent to Services:  The patient was given information about Chronic Care Management services, agreed to services, and gave verbal consent prior to initiation of services.  Please see initial visit note for detailed documentation.   Patient Care Team: [name], NP as PCP - General (Family Medicine) [name], [facility] as Pharmacist (Pharmacist)  Recent office visits: 03/04/21 [name], NP: Patient presented for DM follow up. No change in A1c at 7.2%. No change in medications.  12/03/20 [name], NP: Patient presented for annual exam. A1c 7.2%. No changes made.  09/07/20 [name], LPN: Patient presented for medicare annual wellness exam.  09/03/20 [name], NP: Patient presented for DM follow up. A1c improved to 7.1%. Influenza immunization given. Follow up in 3 months.  Recent consult visits: 10/12/20 Gala Romney (ophthalmology): Unable to access notes.  Hospital visits: None in previous 6 months  Objective:  Lab Results  Component Value Date   CREATININE 1.30 05/28/2020   BUN 19 05/28/2020   GFR 66.81 05/28/2020   GFRNONAA >60 05/25/2011   GFRAA >60 05/25/2011   NA 137 05/28/2020   K 4.8 05/28/2020   CALCIUM 10.0 05/28/2020   CO2 28 05/28/2020   GLUCOSE 110 (H) 05/28/2020    Lab Results  Component Value Date/Time   HGBA1C 7.2 (A) 03/04/2021 07:59 AM   HGBA1C 7.2 (A) 12/03/2020 08:45 AM   HGBA1C 7.8 (H) 05/28/2020 07:46 AM   HGBA1C 8.0 (H) 10/09/2019 07:20 AM   HGBA1C 9.5  (A) 01/10/2019 08:30 AM   GFR 66.81 05/28/2020 07:46 AM   GFR 74.67 10/10/2018 08:30 AM   MICROALBUR 3.1 (H) 10/10/2018 08:30 AM   MICROALBUR 7.0 (H) 02/02/2016 08:48 AM    Last diabetic Eye exam:  Lab Results  Component Value Date/Time   HMDIABEYEEXA No Retinopathy 10/12/2020 12:00 AM    Last diabetic Foot exam: No results found for: HMDIABFOOTEX   Lab Results  Component Value Date   CHOL 156 05/28/2020   HDL 62.10 05/28/2020   LDLCALC 79 05/28/2020   TRIG 74.0 05/28/2020   CHOLHDL 3 05/28/2020    Hepatic Function Latest Ref Rng & Units 05/28/2020 10/10/2018 02/02/2016  Total Protein 6.0 - 8.3 g/dL 7.8 7.4 7.4  Albumin 3.5 - 5.2 g/dL 4.1 4.1 4.3  AST 0 - 37 U/L '21 22 23  ' ALT 0 - 53 U/L 31 33 37  Alk Phosphatase 39 - 117 U/L 73 74 73  Total Bilirubin 0.2 - 1.2 mg/dL 0.3 0.4 0.4  Bilirubin, Direct 0.0 - 0.3 mg/dL - 0.1 -    Lab Results  Component Value Date/Time   TSH 2.96 05/28/2020 07:46 AM   TSH 2.73 10/10/2018 08:30 AM   FREET4 0.92 05/24/2011 01:59 PM    CBC Latest Ref Rng & Units 05/28/2020 10/10/2018 02/02/2016  WBC 4.0 - 10.5 K/uL 6.5 5.0 5.9  Hemoglobin 13.0 - 17.0 g/dL 15.8 15.8 16.1  Hematocrit 39.0 - 52.0 % 46.9 46.7 47.9  Platelets 150.0 - 400.0 K/uL 223.0 150.0 178.0  No results found for: VD25OH  Clinical ASCVD: No  The 10-year ASCVD risk score Mikey Bussing DC Jr., et al., 2013) is: 24.7%   Values used to calculate the score:     Age: 78 years     Sex: Male     Is Non-Hispanic African American: Yes     Diabetic: Yes     Tobacco smoker: No     Systolic Blood Pressure: 371 mmHg     Is BP treated: Yes     HDL Cholesterol: 62.1 mg/dL     Total Cholesterol: 156 mg/dL    Depression screen [facility] 2/9 09/07/2020 09/05/2019 02/02/2016  Decreased Interest 0 0 0  Down, Depressed, Hopeless 0 0 0  PHQ - 2 Score 0 0 0  Altered sleeping 0 - -  Tired, decreased energy 0 - -  Change in appetite 0 - -  Feeling bad or failure about yourself  0 - -  Trouble  concentrating 0 - -  Moving slowly or fidgety/restless 0 - -  Suicidal thoughts 0 - -  PHQ-9 Score 0 - -  Difficult doing work/chores Not difficult at all - -      Social History   Tobacco Use  Smoking Status Never Smoker  Smokeless Tobacco Never Used   BP Readings from Last 3 Encounters:  03/04/21 124/82  12/03/20 (!) 132/98  09/03/20 138/88   Pulse Readings from Last 3 Encounters:  03/04/21 83  12/03/20 76  09/03/20 63   Wt Readings from Last 3 Encounters:  03/04/21 251 lb 6.4 oz (114 kg)  12/03/20 245 lb 12.8 oz (111.5 kg)  09/03/20 246 lb 9.6 oz (111.9 kg)   BMI Readings from Last 3 Encounters:  03/04/21 31.42 kg/m  12/03/20 30.72 kg/m  09/03/20 31.66 kg/m    Assessment/Interventions: Review of patient past medical history, allergies, medications, health status, including review of consultants reports, laboratory and other test data, was performed as part of comprehensive evaluation and provision of chronic care management services.   SDOH:  (Social Determinants of Health) assessments and interventions performed: No  SDOH Screenings   Alcohol Screen: Low Risk   . Last Alcohol Screening Score (AUDIT): 4  Depression (PHQ2-9): Low Risk   . PHQ-2 Score: 0  Financial Resource Strain: Low Risk   . Difficulty of Paying Living Expenses: Not hard at all  Food Insecurity: No Food Insecurity  . Worried About Charity fundraiser in the Last Year: Never true  . Ran Out of Food in the Last Year: Never true  Housing: Low Risk   . Last Housing Risk Score: 0  Physical Activity: Insufficiently Active  . Days of Exercise per Week: 3 days  . Minutes of Exercise per Session: 30 min  Social Connections: Moderately Isolated  . Frequency of Communication with Friends and Family: More than three times a week  . Frequency of Social Gatherings with Friends and Family: More than three times a week  . Attends Religious Services: More than 4 times per year  . Active Member of  Clubs or Organizations: No  . Attends Archivist Meetings: Never  . Marital Status: Widowed  Stress: No Stress Concern Present  . Feeling of Stress : Not at all  Tobacco Use: Low Risk   . Smoking Tobacco Use: Never Smoker  . Smokeless Tobacco Use: Never Used  Transportation Needs: No Transportation Needs  . Lack of Transportation (Medical): No  . Lack of Transportation (Non-Medical): No    CCM  Care Plan  Allergies  Allergen Reactions  . Lisinopril     hyperkalemia    Medications Reviewed Today    Reviewed by Rebecca Eaton, [place] (Certified Medical Assistant) on 03/04/21 at [phone]  Med List Status: <None>  Medication Order Taking? Sig Documenting Provider Last Dose Status Informant  Blood Glucose Monitoring Suppl (ONE TOUCH ULTRA 2) w/Device KIT [id number] Yes Use the device daily to check your blood sugars 1-4 times as instructed. Nafziger, Tommi Rumps, NP Taking Active   diltiazem (CARDIZEM CD) 120 MG 24 hr capsule [id number] Yes TAKE 1 CAPSULE BY MOUTH EVERY DAY Nafziger, Tommi Rumps, NP Taking Active   empagliflozin (JARDIANCE) 25 MG TABS tablet [id number] Yes Take 1 tablet (25 mg total) by mouth daily. Nafziger, Tommi Rumps, NP Taking Active   glipiZIDE (GLUCOTROL XL) 10 MG 24 hr tablet [id number] Yes TAKE 1 TABLET BY MOUTH DAILY WITH BREAKFAST Nafziger, Tommi Rumps, NP Taking Active   glucose blood (ONE TOUCH ULTRA TEST) test strip [id number] Yes USE TO TEST BLOOD GLUCOSE TWICE DAILY Nafziger, Tommi Rumps, NP Taking Active   Lancets [facility] ULTRASOFT) lancets [id number] Yes Use to check blood sugar BID Nafziger, Tommi Rumps, NP Taking Active   metFORMIN (GLUCOPHAGE) 1000 MG tablet [id number] Yes TAKE 1 TABLET BY MOUTH TWICE A DAY WITH FOOD Nafziger, Tommi Rumps, NP Taking Active   rosuvastatin (CRESTOR) 5 MG tablet [id number] Yes Take 1 tablet (5 mg total) by mouth at bedtime. [name], NP Taking Active           Patient Active Problem List   Diagnosis Date Noted  . Routine general medical examination at a  health care facility 02/02/2016  . Type 2 diabetes mellitus ([place]) 06/12/2012  . Syncope - neurally mediated 07/04/2011  . Essential hypertension 12/24/2007  . HYPERGLYCEMIA 12/24/2007    Immunization History  Administered Date(s) Administered  . Fluad Quad(high Dose 65+) 09/11/2019, 09/03/2020  . Influenza Split 09/12/2012  . Influenza Whole 12/24/2007  . Influenza,inj,Quad PF,6+ Mos 02/02/2016, 10/10/2018  . PFIZER(Purple Top)SARS-COV-2 Vaccination 02/24/2020, 03/16/2020, 02/25/2021  . Pneumococcal Conjugate-13 09/11/2019  . Pneumococcal Polysaccharide-23 02/25/2016  . Td 01/31/2001  . Tdap 02/02/2016  . Zoster Recombinat (Shingrix) 02/25/2021    Conditions to be addressed/monitored:  Hypertension, Hyperlipidemia and Diabetes  Care Plan : CCM Pharmacy Care Plan  Updates made by [name], [place] since 04/01/2021 12:00 AM    Problem: Problem: diabetes, hypertension, hyperlipidemia     Long-Range Goal: Patient-Specific Goal   Start Date: 03/18/2021  Expected End Date: 03/18/2022  This Visit's Progress: On track  Priority: High  Note:   Current Barriers:  . Unable to achieve control of diabetes   Pharmacist Clinical Goal(s):  Marland Kitchen Patient will achieve adherence to monitoring guidelines and medication adherence to achieve therapeutic efficacy . achieve control of diabetes as evidenced by A1c  through collaboration with PharmD and provider.   Interventions: . 1:1 collaboration with [name], NP regarding development and update of comprehensive plan of care as evidenced by provider attestation and co-signature . Inter-disciplinary care team collaboration (see longitudinal plan of care) . Comprehensive medication review performed; medication list updated in electronic medical record  Hypertension (BP goal <130/80) -Not ideally controlled -Current treatment: . Diltiazem (Cardizem CD) 156m, 1 capsule once daily -Medications previously tried: none  -Current home  readings: 139/80 (checking weekly) -Current dietary habits: limits salt intake -Current exercise habits: walking almost daily -Denies hypotensive/hypertensive symptoms -Educated on Exercise goal of 150 minutes per week; Importance of home blood pressure monitoring; -Counseled to monitor BP  at home weekly, document, and provide log at future appointments -Counseled on diet and exercise extensively Recommended to continue current medication  Hyperlipidemia: (LDL goal < 70) -Uncontrolled -Current treatment: . Rosuvastatin 5 mg 1 tablet daily  -Medications previously tried: none  -Current dietary patterns: working on cutting out sweets -Current exercise habits: walking almost daily -Educated on Cholesterol goals;  Benefits of statin for ASCVD risk reduction; Importance of limiting foods high in cholesterol; Exercise goal of 150 minutes per week; -Counseled on diet and exercise extensively Recommended to continue current medication  Diabetes (A1c goal <7%) -Uncontrolled -Current medications:  empagliflozin (Jardiance) 20m, 1 tablet once daily   Glipizide (Glucotrol XL) 120m 1 tablet once daily with breakfast  Metformin 100051m1 tablet twice daily with a meal - taking once daily -Medications previously tried: none  -Current home glucose readings . fasting glucose: 139, 110-140 (only in the mornings) . post prandial glucose: none -Reports hypoglycemic/hyperglycemic symptoms -Current meal patterns:  . breakfast: did not discuss . lunch: did not discuss  . dinner: did not discuss . snacks: did not discuss . drinks: did not discuss -Current exercise: walking almost daily -Educated on A1c and blood sugar goals; Exercise goal of 150 minutes per week; Carbohydrate counting and/or plate method -Counseled to check feet daily and get yearly eye exams -Counseled on diet and exercise extensively Recommended to continue current medication Mailed healthy plate handout to assist in  dietary changes  Health Maintenance -Vaccine gaps: Prevnar 20 -Current therapy:  . No medications -Educated on Cost vs benefit of each product must be carefully weighed by individual consumer -Patient is satisfied with current therapy and denies issues -Recommended to continue as is  Patient Goals/Self-Care Activities . Patient will:  - take medications as prescribed check glucose daily, document, and provide at future appointments check blood pressure weekly, document, and provide at future appointments target a minimum of 150 minutes of moderate intensity exercise weekly engage in dietary modifications by cutting back on portion sizes  Follow Up Plan: Telephone follow up appointment with care management team member scheduled for: 4 months       Medication Assistance: None required.  Patient affirms current coverage meets needs.  [id number]: [phone]: [phone] pill box? No - patient reports routine; and taking all meds once a day in the morning. Pt endorses 100% compliance  We discussed: Current pharmacy is preferred with insurance plan and patient is satisfied with pharmacy services Patient decided to: Continue current medication management strategy  Care Plan and Follow Up Patient Decision:  Patient agrees to Care Plan and Follow-up.  Plan: Telephone follow up appointment with care management team member scheduled for:  4 months  MadeJeni SallesarmD BCACDuPontrmacist [phone]

## 2021-04-06 ENCOUNTER — Other Ambulatory Visit: Payer: Self-pay | Admitting: Adult Health

## 2021-04-06 ENCOUNTER — Telehealth: Payer: Self-pay | Admitting: Pharmacist

## 2021-04-06 MED ORDER — METFORMIN HCL ER 500 MG PO TB24
1000.0000 mg | ORAL_TABLET | Freq: Two times a day (BID) | ORAL | 0 refills | Status: DC
Start: 2021-04-06 — End: 2021-06-17

## 2021-04-06 NOTE — Telephone Encounter (Signed)
Called patient to make him aware of the change with metformin from IR to ER and dose increase. Patient will take 1 tablet of the extended release with breakfast and 2 tablets with dinner for 2 weeks. Patient verbalized his understanding.  Plan to check back in 2 weeks and can increase to 2 tablets twice daily at that time if patient tolerates.

## 2021-05-06 ENCOUNTER — Other Ambulatory Visit: Payer: Self-pay | Admitting: Adult Health

## 2021-05-06 DIAGNOSIS — E1169 Type 2 diabetes mellitus with other specified complication: Secondary | ICD-10-CM

## 2021-05-25 ENCOUNTER — Telehealth: Payer: Self-pay | Admitting: Pharmacist

## 2021-05-25 NOTE — Chronic Care Management (AMB) (Signed)
Chronic Care Management Pharmacy Assistant   Name: Jason [name]  MRN: [id number] DOB: 03-29-1956  Reason for Encounter: Disease State/ General Assessment Call.    Conditions to be addressed/monitored: HTN and DMII  Recent office visits:  None.   Recent consult visits:  None.   Hospital visits:  None in previous 6 months  Medications: Outpatient Encounter Medications as of 05/25/2021  Medication Sig   Blood Glucose Monitoring Suppl (ONE TOUCH ULTRA 2) w/Device KIT Use the device daily to check your blood sugars 1-4 times as instructed.   diltiazem (CARDIZEM CD) 120 MG 24 hr capsule TAKE 1 CAPSULE BY MOUTH EVERY DAY   glipiZIDE (GLUCOTROL XL) 10 MG 24 hr tablet TAKE 1 TABLET BY MOUTH DAILY WITH BREAKFAST   glucose blood (ONE TOUCH ULTRA TEST) test strip USE TO TEST BLOOD GLUCOSE TWICE DAILY   JARDIANCE 25 MG TABS tablet TAKE 1 TABLET (25 MG TOTAL) BY MOUTH DAILY.   Lancets (ONETOUCH ULTRASOFT) lancets Use to check blood sugar BID   metFORMIN (GLUCOPHAGE XR) 500 MG 24 hr tablet Take 2 tablets (1,000 mg total) by mouth in the morning and at bedtime.   rosuvastatin (CRESTOR) 5 MG tablet Take 1 tablet (5 mg total) by mouth at bedtime.   No facility-administered encounter medications on file as of 05/25/2021.    Recent Relevant Labs: Lab Results  Component Value Date/Time   HGBA1C 7.2 (A) 03/04/2021 07:59 AM   HGBA1C 7.2 (A) 12/03/2020 08:45 AM   HGBA1C 7.8 (H) 05/28/2020 07:46 AM   HGBA1C 8.0 (H) 10/09/2019 07:20 AM   HGBA1C 9.5 (A) 01/10/2019 08:30 AM   MICROALBUR 3.1 (H) 10/10/2018 08:30 AM   MICROALBUR 7.0 (H) 02/02/2016 08:48 AM    Kidney Function Lab Results  Component Value Date/Time   CREATININE 1.30 05/28/2020 07:46 AM   CREATININE 1.25 10/10/2018 08:30 AM   GFR 66.81 05/28/2020 07:46 AM   GFRNONAA >60 05/25/2011 04:49 AM   GFRAA >60 05/25/2011 04:49 AM    Current antihyperglycemic regimen:  empagliflozin (Jardiance) 28m, 1 tablet once  daily Glipizide (Glucotrol XL) 137m 1 tablet once daily with breakfast Metformin 100034m2 tablet twice daily with a meal - taking once daily What recent interventions/DTPs have been made to improve glycemic control:  None.  Have there been any recent hospitalizations or ED visits since last visit with CPP? No Patient denies hypoglycemic symptoms, including None Patient denies hyperglycemic symptoms, including none How often are you checking your blood sugar? once daily What are your blood sugars ranging? Patient only checks in the mornings before eating and has not been writing the numbers down.  Fasting: this am was 125 Before meals: None  After meals: None Bedtime: None During the week, how often does your blood glucose drop below 70? Never Are you checking your feet daily/regularly? Yes. Checks feet everyday.   Adherence Review: Is the patient currently on a STATIN medication? Yes Is the patient currently on ACE/ARB medication? No Does the patient have >5 day gap between last estimated fill dates? No   Reviewed chart prior to disease state call. Spoke with patient regarding BP  Recent Office Vitals: BP Readings from Last 3 Encounters:  03/04/21 124/82  12/03/20 (!) 132/98  09/03/20 138/88   Pulse Readings from Last 3 Encounters:  03/04/21 83  12/03/20 76  09/03/20 63    Wt Readings from Last 3 Encounters:  03/04/21 251 lb 6.4 oz (114 kg)  12/03/20 245 lb 12.8 oz (111.5 kg)  09/03/20 246 lb 9.6 oz (111.9 kg)     Kidney Function Lab Results  Component Value Date/Time   CREATININE 1.30 05/28/2020 07:46 AM   CREATININE 1.25 10/10/2018 08:30 AM   GFR 66.81 05/28/2020 07:46 AM   GFRNONAA >60 05/25/2011 04:49 AM   GFRAA >60 05/25/2011 04:49 AM    BMP Latest Ref Rng & Units 05/28/2020 10/10/2018 02/02/2016  Glucose 70 - 99 mg/dL 110(H) 363(H) 203(H)  BUN 6 - 23 mg/dL '19 12 13  ' Creatinine 0.40 - 1.50 mg/dL 1.30 1.25 1.34  Sodium 135 - 145 mEq/L 137 137 140   Potassium 3.5 - 5.1 mEq/L 4.8 4.2 5.4(H)  Chloride 96 - 112 mEq/L 103 100 104  CO2 19 - 32 mEq/L '28 29 30  ' Calcium 8.4 - 10.5 mg/dL 10.0 9.6 9.8    Current antihypertensive regimen:  Diltiazem (Cardizem CD) 168m, 1 capsule once daily How often are you checking your Blood Pressure? 1-2x per week Current home BP readings: this morning was 142/82 What recent interventions/DTPs have been made by any provider to improve Blood Pressure control since last CPP Visit: None.  Any recent hospitalizations or ED visits since last visit with CPP? No  Adherence Review: Is the patient currently on ACE/ARB medication? No Does the patient have >5 day gap between last estimated fill dates? No  Notes: Spoke with patient and reviewed all medications as listed. Patient reports taking all medications as prescribed and no issues at this time. Patient reports he does check his blood pressure a couple times a week and his blood sugar every morning but has not been keeping a log. I re encouraged patient to keep a log of all of his readings. Patient stated that for breakfast he tends to usually eat a sausage, egg and cheese sandwich made at home. Patient stated for lunch he usually eats fast food about three times a week and at home it is usually left overs he eats. Patient stated for dinner he usually has a meat like pork chops or chicken with a vegetable. Patient states he does not eat red meat a lot and he tries to rotate between meats. Patient stated he does not add salt to anything unless it taste bland. Patient stated he does drink 6-8 glasses of water a day and he does like to have coffee in the mornings.patient states he does not get a consistent schedule of exercise but he is very active working in his yard and tending to his flowers. Patient was agreeable to start writing his readings down. Patient thanked me for my call.   Star Rating Drugs:  Glipizide 168m- [facility] Empagliflozin  2594m [facility] Metformin 1000m32mlast [facility] Rosuvastatin 5mg 74mast [facility]  [place] Assistant [phone]

## 2021-06-11 NOTE — Progress Notes (Signed)
    Chronic Care Management Pharmacy Assistant   Name: Jason [name]  MRN: [id number] DOB: 1956/09/10  Spoke with patient per [name] clinical pharmacist and verified that patient is taking the Metformin 1000mg  two tablets twice daily and not once daily. Message sent to [name] to make aware. Patient thanked me for my call.   [place]  Clinical Pharmacist Assistant [phone]

## 2021-06-16 ENCOUNTER — Other Ambulatory Visit: Payer: Self-pay

## 2021-06-17 ENCOUNTER — Encounter: Payer: Self-pay | Admitting: Adult Health

## 2021-06-17 ENCOUNTER — Ambulatory Visit (INDEPENDENT_AMBULATORY_CARE_PROVIDER_SITE_OTHER): Payer: Medicare Other | Admitting: Adult Health

## 2021-06-17 VITALS — BP 106/80 | HR 85 | Temp 96.5°F | Ht 74.75 in | Wt 241.0 lb

## 2021-06-17 DIAGNOSIS — I10 Essential (primary) hypertension: Secondary | ICD-10-CM | POA: Diagnosis not present

## 2021-06-17 DIAGNOSIS — E782 Mixed hyperlipidemia: Secondary | ICD-10-CM

## 2021-06-17 DIAGNOSIS — R519 Headache, unspecified: Secondary | ICD-10-CM | POA: Diagnosis not present

## 2021-06-17 DIAGNOSIS — Z0001 Encounter for general adult medical examination with abnormal findings: Secondary | ICD-10-CM | POA: Diagnosis not present

## 2021-06-17 DIAGNOSIS — Z125 Encounter for screening for malignant neoplasm of prostate: Secondary | ICD-10-CM | POA: Diagnosis not present

## 2021-06-17 DIAGNOSIS — E1169 Type 2 diabetes mellitus with other specified complication: Secondary | ICD-10-CM | POA: Diagnosis not present

## 2021-06-17 LAB — COMPREHENSIVE METABOLIC PANEL
ALT: 35 U/L (ref 0–53)
AST: 34 U/L (ref 0–37)
Albumin: 3.8 g/dL (ref 3.5–5.2)
Alkaline Phosphatase: 75 U/L (ref 39–117)
BUN: 19 mg/dL (ref 6–23)
CO2: 27 mEq/L (ref 19–32)
Calcium: 9.6 mg/dL (ref 8.4–10.5)
Chloride: 101 mEq/L (ref 96–112)
Creatinine, Ser: 1.27 mg/dL (ref 0.40–1.50)
GFR: 58.6 mL/min — ABNORMAL LOW (ref >60.00)
Glucose, Bld: 162 mg/dL — ABNORMAL HIGH (ref 70–99)
Potassium: 4.4 mEq/L (ref 3.5–5.1)
Sodium: 136 mEq/L (ref 135–145)
Total Bilirubin: 0.4 mg/dL (ref 0.2–1.2)
Total Protein: 8 g/dL (ref 6.0–8.3)

## 2021-06-17 LAB — CBC WITH DIFFERENTIAL/PLATELET
Basophils Absolute: 0 10*3/uL (ref 0.0–0.1)
Basophils Relative: 0.5 % (ref 0.0–3.0)
Eosinophils Absolute: 0.1 10*3/uL (ref 0.0–0.7)
Eosinophils Relative: 1.6 % (ref 0.0–5.0)
HCT: 44.3 % (ref 39.0–52.0)
Hemoglobin: 15.1 g/dL (ref 13.0–17.0)
Lymphocytes Relative: 62 % — ABNORMAL HIGH (ref 12.0–46.0)
Lymphs Abs: 4.7 10*3/uL — ABNORMAL HIGH (ref 0.7–4.0)
MCHC: 34 g/dL (ref 30.0–36.0)
MCV: 94.6 fl (ref 78.0–100.0)
Monocytes Absolute: 0.8 10*3/uL (ref 0.1–1.0)
Monocytes Relative: 10.2 % (ref 3.0–12.0)
Neutro Abs: 1.9 10*3/uL (ref 1.4–7.7)
Neutrophils Relative %: 25.7 % — ABNORMAL LOW (ref 43.0–77.0)
Platelets: 163 10*3/uL (ref 150.0–400.0)
RBC: 4.68 Mil/uL (ref 4.22–5.81)
RDW: 13.8 % (ref 11.5–15.5)
WBC: 7.6 10*3/uL (ref 4.0–10.5)

## 2021-06-17 LAB — HEMOGLOBIN A1C: Hgb A1c MFr Bld: 8.3 % — ABNORMAL HIGH (ref 4.6–6.5)

## 2021-06-17 LAB — MICROALBUMIN / CREATININE URINE RATIO
Creatinine,U: 105.2 mg/dL
Microalb Creat Ratio: 6.3 mg/g (ref 0.0–30.0)
Microalb, Ur: 6.6 mg/dL — ABNORMAL HIGH (ref 0.0–1.9)

## 2021-06-17 LAB — LIPID PANEL
Cholesterol: 105 mg/dL (ref 0–200)
HDL: 25.7 mg/dL — ABNORMAL LOW (ref >39.00)
LDL Cholesterol: 48 mg/dL (ref 0–99)
NonHDL: 79.62
Total CHOL/HDL Ratio: 4
Triglycerides: 159 mg/dL — ABNORMAL HIGH (ref 0.0–149.0)
VLDL: 31.8 mg/dL (ref 0.0–40.0)

## 2021-06-17 LAB — TSH: TSH: 2.92 u[IU]/mL (ref 0.35–5.50)

## 2021-06-17 LAB — PSA: PSA: 0.58 ng/mL (ref 0.10–4.00)

## 2021-06-17 MED ORDER — DILTIAZEM HCL ER COATED BEADS 120 MG PO CP24: ORAL_CAPSULE | ORAL | 3 refills | Status: DC

## 2021-06-17 MED ORDER — METFORMIN HCL ER 500 MG PO TB24: 1000.0000 mg | ORAL_TABLET | Freq: Two times a day (BID) | ORAL | 0 refills | Status: DC

## 2021-06-17 MED ORDER — ROSUVASTATIN CALCIUM 5 MG PO TABS: 5.0000 mg | ORAL_TABLET | Freq: Every day | ORAL | 3 refills | Status: DC

## 2021-06-17 MED ORDER — ONETOUCH ULTRASOFT LANCETS MISC: 12 refills | Status: AC

## 2021-06-17 MED ORDER — GLUCOSE BLOOD VI STRP: ORAL_STRIP | 3 refills | Status: DC

## 2021-06-17 MED ORDER — GLIPIZIDE ER 10 MG PO TB24: 10.0000 mg | ORAL_TABLET | Freq: Every day | ORAL | 0 refills | Status: DC

## 2021-06-17 NOTE — Patient Instructions (Signed)
It was great seeing you today   We will follow up with you regarding your blood work   I think your headache may be from sleep apnea. I am going to order a sleep study on you - they will call you to schedule your appointment

## 2021-06-17 NOTE — Progress Notes (Signed)
Subjective:    Patient ID: Jason [name], male    DOB: 1956-07-27, 65 y.o.   MRN: [id number]  HPI Patient presents for yearly preventative medicine examination. He is a pleasant 65 year old male who  has a past medical history of Bell palsy, Diabetes mellitus without complication ([place]), HTN (hypertension), and Hyperglycemia.  DM -he is currently prescribed glipizide 10 mg ER daily, metformin 1000 mg daily, and Jardiance 25 mg daily.  He does check his blood sugars at home periodically and reports readings between 80-155.  He denies any hypoglycemic events. Lab Results  Component Value Date   HGBA1C 7.2 (A) 03/04/2021   HTN -takes Cardizem 180 mg daily.  Does not monitor his blood pressure at home.  He denies dizziness, lightheadedness, chest pain, or shortness of breath. BP Readings from Last 3 Encounters:  06/17/21 106/80  03/04/21 124/82  12/03/20 (!) 132/98   Hyperlipidemia - placed on Crestor 5 mg last year during his CPE. He denies myalgia or fatigue  Lab Results  Component Value Date   CHOL 156 05/28/2020   HDL 62.10 05/28/2020   LDLCALC 79 05/28/2020   TRIG 74.0 05/28/2020   CHOLHDL 3 05/28/2020    Headaches -new issue for patient.  Reports that over the last few weeks he has had pretty frequent headaches.  Reports that headaches are worse and happen mostly in the morning but can happen during the afternoon as well.  Usually a frontal headache but will have headache in the back of his head.  Does have some blurred vision associated with the headaches.  Usually doing range of motion exercises help with his headaches.  Does report that he snores, denies apneic episodes, wakes up most mornings feeling well rested but could take a nap in the middle of the day every day.  All immunizations and health maintenance protocols were reviewed with the patient and needed orders were placed.  Appropriate screening laboratory values were ordered for the patient including screening of  hyperlipidemia, renal function and hepatic function. If indicated by BPH, a PSA was ordered.  Medication reconciliation,  past medical history, social history, problem list and allergies were reviewed in detail with the patient  Goals were established with regard to weight loss, exercise, and  diet in compliance with medications. He has been exercising more ( lawn care) and trying to eat health.  Wt Readings from Last 3 Encounters:  06/17/21 241 lb (109.3 kg)  03/04/21 251 lb 6.4 oz (114 kg)  12/03/20 245 lb 12.8 oz (111.5 kg)   He is up to date on routine colon cancer screening and diabetic eye exam  Review of Systems  Constitutional: Negative.   HENT: Negative.    Eyes: Negative.   Respiratory: Negative.    Cardiovascular: Negative.   Gastrointestinal: Negative.   Endocrine: Negative.   Genitourinary: Negative.   Musculoskeletal: Negative.   Skin: Negative.   Allergic/Immunologic: Negative.   Neurological:  Positive for headaches.  Hematological: Negative.   Psychiatric/Behavioral: Negative.    All other systems reviewed and are negative.  Past Medical History:  Diagnosis Date   Bell palsy    Diabetes mellitus without complication (HCC)    HTN (hypertension)    Hyperglycemia     Social History   Socioeconomic History   Marital status: Married    Spouse name: Not on file   Number of children: 2   Years of education: 12   Highest education level: Not on file  Occupational History  Occupation: [phone]    Employer: [employer]   Occupation: Administrator  Tobacco Use   Smoking status: Never   Smokeless tobacco: Never  Vaping Use   Vaping Use: Never used  Substance and Sexual Activity   Alcohol use: Yes    Alcohol/week: 6.0 standard drinks    Types: 6 Cans of beer per week    Comment: on weekends   Drug use: No   Sexual activity: Not on file  Other Topics Concern   Not on file  Social History Narrative      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       Fun: fix cars, drag race    Social Determinants of Radio broadcast assistant Strain: Low Risk    Difficulty of Paying Living Expenses: Not hard at all  Food Insecurity: No Food Insecurity   Worried About Charity fundraiser in the Last Year: Never true   Arboriculturist in the Last Year: Never true  Transportation Needs: No Transportation Needs   Lack of Transportation (Medical): No   Lack of Transportation (Non-Medical): No  Physical Activity: Insufficiently Active   Days of Exercise per Week: 3 days   Minutes of Exercise per Session: 30 min  Stress: No Stress Concern Present   Feeling of Stress : Not at all  Social Connections: Moderately Isolated   Frequency of Communication with Friends and Family: More than three times a week   Frequency of Social Gatherings with Friends and Family: More than three times a week   Attends Religious Services: More than 4 times per year   Active Member of [employer] or Organizations: No   Attends Archivist Meetings: Never   Marital Status: Widowed  Human resources officer Violence: Not At Risk   Fear of Current or Ex-Partner: No   Emotionally Abused: No   Physically Abused: No   Sexually Abused: No    Past Surgical History:  Procedure Laterality Date   bellpalsy     COLONOSCOPY     COLONOSCOPY W/ POLYPECTOMY      Family History  Problem Relation Age of Onset   Stroke Mother    Colon cancer Brother    High blood pressure Brother    Stomach cancer Brother    Colon cancer Other        1st degree relative <60   Diabetes Other        1st degree relative    Stroke Other    Colon cancer Sister    High blood pressure Sister    Esophageal cancer Neg Hx    Rectal cancer Neg Hx     Allergies  Allergen Reactions   Lisinopril     hyperkalemia    Current Outpatient Medications on File Prior to Visit  Medication Sig Dispense Refill   Blood Glucose Monitoring Suppl (ONE TOUCH  ULTRA 2) w/Device KIT Use the device daily to check your blood sugars 1-4 times as instructed. 1 kit 0   diltiazem (CARDIZEM CD) 120 MG 24 hr capsule TAKE 1 CAPSULE BY MOUTH EVERY DAY 90 capsule 3   glipiZIDE (GLUCOTROL XL) 10 MG 24 hr tablet TAKE 1 TABLET BY MOUTH DAILY WITH BREAKFAST 90 tablet 0   glucose blood (ONE TOUCH ULTRA TEST) test strip USE TO TEST BLOOD GLUCOSE TWICE DAILY 200 each 3   JARDIANCE 25 MG TABS tablet TAKE 1 TABLET (  25 MG TOTAL) BY MOUTH DAILY. 90 tablet 0   Lancets (ONETOUCH ULTRASOFT) lancets Use to check blood sugar BID 100 each 12   metFORMIN (GLUCOPHAGE XR) 500 MG 24 hr tablet Take 2 tablets (1,000 mg total) by mouth in the morning and at bedtime. 360 tablet 0   rosuvastatin (CRESTOR) 5 MG tablet Take 1 tablet (5 mg total) by mouth at bedtime. 90 tablet 3   No current facility-administered medications on file prior to visit.    BP 106/80   Pulse 85   Temp (!) 96.5 F (35.8 C) (Temporal)   Ht 6' 2.75" (1.899 m)   Wt 241 lb (109.3 kg)   SpO2 97%   BMI 30.32 kg/m       Objective:   Physical Exam Vitals and nursing note reviewed.  Constitutional:      General: He is not in acute distress.    Appearance: Normal appearance. He is well-developed and normal weight.  HENT:     Head: Normocephalic and atraumatic.     Right Ear: Tympanic membrane, ear canal and external ear normal. There is no impacted cerumen.     Left Ear: Tympanic membrane, ear canal and external ear normal. There is no impacted cerumen.     Nose: Nose normal. No congestion or rhinorrhea.     Mouth/Throat:     Mouth: Mucous membranes are moist.     Pharynx: Oropharynx is clear. No oropharyngeal exudate or posterior oropharyngeal erythema.  Eyes:     General:        Right eye: No discharge.        Left eye: No discharge.     Extraocular Movements: Extraocular movements intact.     Conjunctiva/sclera: Conjunctivae normal.     Pupils: Pupils are equal, round, and reactive to light.  Neck:      Vascular: No carotid bruit.     Trachea: No tracheal deviation.  Cardiovascular:     Rate and Rhythm: Normal rate and regular rhythm.     Pulses: Normal pulses.     Heart sounds: Normal heart sounds. No murmur heard.   No friction rub. No gallop.  Pulmonary:     Effort: Pulmonary effort is normal. No respiratory distress.     Breath sounds: Normal breath sounds. No stridor. No wheezing, rhonchi or rales.  Chest:     Chest wall: No tenderness.  Abdominal:     General: Bowel sounds are normal. There is no distension.     Palpations: Abdomen is soft. There is no mass.     Tenderness: There is no abdominal tenderness. There is no right CVA tenderness, left CVA tenderness, guarding or rebound.     Hernia: No hernia is present.  Musculoskeletal:        General: No swelling, tenderness, deformity or signs of injury. Normal range of motion.     Right lower leg: No edema.     Left lower leg: No edema.  Lymphadenopathy:     Cervical: No cervical adenopathy.  Skin:    General: Skin is warm and dry.     Capillary Refill: Capillary refill takes less than 2 seconds.     Coloration: Skin is not jaundiced or pale.     Findings: No bruising, erythema, lesion or rash.  Neurological:     General: No focal deficit present.     Mental Status: He is alert and oriented to person, place, and time.     Cranial Nerves: No cranial nerve  deficit.     Sensory: No sensory deficit.     Motor: No weakness.     Coordination: Coordination normal.     Gait: Gait normal.     Deep Tendon Reflexes: Reflexes normal.  Psychiatric:        Mood and Affect: Mood normal.        Behavior: Behavior normal.        Thought Content: Thought content normal.        Judgment: Judgment normal.      Assessment & Plan:  1. Encounter for general adult medical examination with abnormal findings - Continue to work on weight loss measures - Follow up in one year or sooner if needed - CBC with Differential/Platelet;  Future - Comprehensive metabolic panel; Future - Hemoglobin A1c; Future - Lipid panel; Future - TSH; Future - TSH - Lipid panel - Hemoglobin A1c - Comprehensive metabolic panel - CBC with Differential/Platelet  2. Essential hypertension - Well controlled. No change  - CBC with Differential/Platelet; Future - Comprehensive metabolic panel; Future - Hemoglobin A1c; Future - Lipid panel; Future - TSH; Future - diltiazem (CARDIZEM CD) 120 MG 24 hr capsule; TAKE 1 CAPSULE BY MOUTH EVERY DAY  Dispense: 90 capsule; Refill: 3 - TSH - Lipid panel - Hemoglobin A1c - Comprehensive metabolic panel - CBC with Differential/Platelet  3. Type 2 diabetes mellitus with other specified complication, without long-term current use of insulin (HCC) - Consider increasing glipizide  - Likely follow up in 3 months  - CBC with Differential/Platelet; Future - Comprehensive metabolic panel; Future - Hemoglobin A1c; Future - Lipid panel; Future - TSH; Future - Microalbumin/Creatinine Ratio, Urine; Future - glipiZIDE (GLUCOTROL XL) 10 MG 24 hr tablet; Take 1 tablet (10 mg total) by mouth daily with breakfast.  Dispense: 90 tablet; Refill: 0 - metFORMIN (GLUCOPHAGE XR) 500 MG 24 hr tablet; Take 2 tablets (1,000 mg total) by mouth in the morning and at bedtime.  Dispense: 360 tablet; Refill: 0 - Lancets (ONETOUCH ULTRASOFT) lancets; Use to check blood sugar BID  Dispense: 100 each; Refill: 12 - glucose blood (ONE TOUCH ULTRA TEST) test strip; USE TO TEST BLOOD GLUCOSE TWICE DAILY  Dispense: 200 each; Refill: 3 - Microalbumin/Creatinine Ratio, Urine - TSH - Lipid panel - Hemoglobin A1c - Comprehensive metabolic panel - CBC with Differential/Platelet  4. Mixed hyperlipidemia - Consider increase in statin  - CBC with Differential/Platelet; Future - Comprehensive metabolic panel; Future - Hemoglobin A1c; Future - Lipid panel; Future - TSH; Future - rosuvastatin (CRESTOR) 5 MG tablet; Take 1 tablet (5  mg total) by mouth at bedtime.  Dispense: 90 tablet; Refill: 3 - TSH - Lipid panel - Hemoglobin A1c - Comprehensive metabolic panel - CBC with Differential/Platelet  5. Prostate cancer screening  - PSA; Future - PSA  6. Frequent headaches - Concern that his headaches are from sleep apnea. Will get him referred to pulmonary for sleep study  - Ambulatory referral to Pulmonology  [name], NP

## 2021-06-18 ENCOUNTER — Other Ambulatory Visit: Payer: Self-pay

## 2021-06-18 ENCOUNTER — Telehealth: Payer: Self-pay | Admitting: Adult Health

## 2021-06-18 NOTE — Telephone Encounter (Signed)
No longer need it 

## 2021-07-09 ENCOUNTER — Emergency Department (EMERGENCY_DEPARTMENT_HOSPITAL)
Admission: EM | Admit: 2021-07-09 | Discharge: 2021-07-09 | Disposition: A | Discharge status: Home / Self Care | Payer: Medicare Other | Source: Home / Self Care | Attending: Emergency Medicine | Admitting: Emergency Medicine

## 2021-07-09 ENCOUNTER — Emergency Department (HOSPITAL_COMMUNITY): Payer: Medicare Other

## 2021-07-09 DIAGNOSIS — Z823 Family history of stroke: Secondary | ICD-10-CM | POA: Diagnosis not present

## 2021-07-09 DIAGNOSIS — G9341 Metabolic encephalopathy: Secondary | ICD-10-CM | POA: Diagnosis not present

## 2021-07-09 DIAGNOSIS — I10 Essential (primary) hypertension: Secondary | ICD-10-CM | POA: Insufficient documentation

## 2021-07-09 DIAGNOSIS — E1165 Type 2 diabetes mellitus with hyperglycemia: Secondary | ICD-10-CM | POA: Diagnosis not present

## 2021-07-09 DIAGNOSIS — F1210 Cannabis abuse, uncomplicated: Secondary | ICD-10-CM | POA: Diagnosis present

## 2021-07-09 DIAGNOSIS — R4781 Slurred speech: Secondary | ICD-10-CM | POA: Insufficient documentation

## 2021-07-09 DIAGNOSIS — R079 Chest pain, unspecified: Secondary | ICD-10-CM | POA: Diagnosis not present

## 2021-07-09 DIAGNOSIS — I639 Cerebral infarction, unspecified: Secondary | ICD-10-CM | POA: Insufficient documentation

## 2021-07-09 DIAGNOSIS — Z79899 Other long term (current) drug therapy: Secondary | ICD-10-CM | POA: Insufficient documentation

## 2021-07-09 DIAGNOSIS — E785 Hyperlipidemia, unspecified: Secondary | ICD-10-CM | POA: Diagnosis not present

## 2021-07-09 DIAGNOSIS — J341 Cyst and mucocele of nose and nasal sinus: Secondary | ICD-10-CM | POA: Diagnosis not present

## 2021-07-09 DIAGNOSIS — F1220 Cannabis dependence, uncomplicated: Secondary | ICD-10-CM | POA: Diagnosis not present

## 2021-07-09 DIAGNOSIS — Z20822 Contact with and (suspected) exposure to covid-19: Secondary | ICD-10-CM | POA: Insufficient documentation

## 2021-07-09 DIAGNOSIS — Z833 Family history of diabetes mellitus: Secondary | ICD-10-CM | POA: Diagnosis not present

## 2021-07-09 DIAGNOSIS — G43109 Migraine with aura, not intractable, without status migrainosus: Secondary | ICD-10-CM

## 2021-07-09 DIAGNOSIS — Z6830 Body mass index (BMI) 30.0-30.9, adult: Secondary | ICD-10-CM | POA: Diagnosis not present

## 2021-07-09 DIAGNOSIS — R29810 Facial weakness: Secondary | ICD-10-CM | POA: Diagnosis not present

## 2021-07-09 DIAGNOSIS — R400 Somnolence: Secondary | ICD-10-CM | POA: Diagnosis not present

## 2021-07-09 DIAGNOSIS — I6782 Cerebral ischemia: Secondary | ICD-10-CM | POA: Diagnosis not present

## 2021-07-09 DIAGNOSIS — R4182 Altered mental status, unspecified: Secondary | ICD-10-CM | POA: Diagnosis not present

## 2021-07-09 DIAGNOSIS — G4730 Sleep apnea, unspecified: Secondary | ICD-10-CM | POA: Diagnosis not present

## 2021-07-09 DIAGNOSIS — Z800 Family history of malignant neoplasm of digestive organs: Secondary | ICD-10-CM | POA: Diagnosis not present

## 2021-07-09 DIAGNOSIS — R519 Headache, unspecified: Secondary | ICD-10-CM | POA: Insufficient documentation

## 2021-07-09 DIAGNOSIS — Z7984 Long term (current) use of oral hypoglycemic drugs: Secondary | ICD-10-CM | POA: Insufficient documentation

## 2021-07-09 DIAGNOSIS — E6609 Other obesity due to excess calories: Secondary | ICD-10-CM | POA: Diagnosis not present

## 2021-07-09 DIAGNOSIS — E119 Type 2 diabetes mellitus without complications: Secondary | ICD-10-CM | POA: Insufficient documentation

## 2021-07-09 DIAGNOSIS — Z743 Need for continuous supervision: Secondary | ICD-10-CM | POA: Diagnosis not present

## 2021-07-09 DIAGNOSIS — G43909 Migraine, unspecified, not intractable, without status migrainosus: Secondary | ICD-10-CM | POA: Diagnosis not present

## 2021-07-09 DIAGNOSIS — R6889 Other general symptoms and signs: Secondary | ICD-10-CM | POA: Diagnosis not present

## 2021-07-09 DIAGNOSIS — B003 Herpesviral meningitis: Secondary | ICD-10-CM | POA: Diagnosis present

## 2021-07-09 DIAGNOSIS — R4701 Aphasia: Secondary | ICD-10-CM | POA: Insufficient documentation

## 2021-07-09 DIAGNOSIS — R29701 NIHSS score 1: Secondary | ICD-10-CM | POA: Diagnosis not present

## 2021-07-09 DIAGNOSIS — G928 Other toxic encephalopathy: Secondary | ICD-10-CM | POA: Diagnosis not present

## 2021-07-09 DIAGNOSIS — E669 Obesity, unspecified: Secondary | ICD-10-CM | POA: Diagnosis present

## 2021-07-09 DIAGNOSIS — G039 Meningitis, unspecified: Secondary | ICD-10-CM | POA: Diagnosis not present

## 2021-07-09 DIAGNOSIS — I4510 Unspecified right bundle-branch block: Secondary | ICD-10-CM | POA: Diagnosis not present

## 2021-07-09 DIAGNOSIS — R404 Transient alteration of awareness: Secondary | ICD-10-CM | POA: Diagnosis not present

## 2021-07-09 DIAGNOSIS — R29818 Other symptoms and signs involving the nervous system: Secondary | ICD-10-CM | POA: Diagnosis not present

## 2021-07-09 LAB — CBC
HCT: 46.5 % (ref 39.0–52.0)
Hemoglobin: 15.3 g/dL (ref 13.0–17.0)
MCH: 32.2 pg (ref 26.0–34.0)
MCHC: 32.9 g/dL (ref 30.0–36.0)
MCV: 97.9 fL (ref 80.0–100.0)
Platelets: 192 10*3/uL (ref 150–400)
RBC: 4.75 MIL/uL (ref 4.22–5.81)
RDW: 13.7 % (ref 11.5–15.5)
WBC: 8.9 10*3/uL (ref 4.0–10.5)
nRBC: 0 % (ref 0.0–0.2)

## 2021-07-09 LAB — DIFFERENTIAL
Abs Immature Granulocytes: 0 10*3/uL (ref 0.00–0.07)
Basophils Absolute: 0.2 10*3/uL — ABNORMAL HIGH (ref 0.0–0.1)
Basophils Relative: 2 %
Eosinophils Absolute: 0.4 10*3/uL (ref 0.0–0.5)
Eosinophils Relative: 5 %
Lymphocytes Relative: 59 %
Lymphs Abs: 5.3 10*3/uL — ABNORMAL HIGH (ref 0.7–4.0)
Monocytes Absolute: 1 10*3/uL (ref 0.1–1.0)
Monocytes Relative: 11 %
Neutro Abs: 2 10*3/uL (ref 1.7–7.7)
Neutrophils Relative %: 23 %
nRBC: 0 /100 WBC

## 2021-07-09 LAB — I-STAT CHEM 8, ED
BUN: 15 mg/dL (ref 8–23)
Calcium, Ion: 1.2 mmol/L (ref 1.15–1.40)
Chloride: 107 mmol/L (ref 98–111)
Creatinine, Ser: 1.1 mg/dL (ref 0.61–1.24)
Glucose, Bld: 113 mg/dL — ABNORMAL HIGH (ref 70–99)
HCT: 47 % (ref 39.0–52.0)
Hemoglobin: 16 g/dL (ref 13.0–17.0)
Potassium: 4 mmol/L (ref 3.5–5.1)
Sodium: 141 mmol/L (ref 135–145)
TCO2: 24 mmol/L (ref 22–32)

## 2021-07-09 LAB — COMPREHENSIVE METABOLIC PANEL
ALT: 30 U/L (ref 0–44)
AST: 29 U/L (ref 15–41)
Albumin: 4 g/dL (ref 3.5–5.0)
Alkaline Phosphatase: 62 U/L (ref 38–126)
Anion gap: 12 (ref 5–15)
BUN: 13 mg/dL (ref 8–23)
CO2: 23 mmol/L (ref 22–32)
Calcium: 9.8 mg/dL (ref 8.9–10.3)
Chloride: 104 mmol/L (ref 98–111)
Creatinine, Ser: 1.19 mg/dL (ref 0.61–1.24)
GFR, Estimated: >60 mL/min (ref >60)
Glucose, Bld: 115 mg/dL — ABNORMAL HIGH (ref 70–99)
Potassium: 4.1 mmol/L (ref 3.5–5.1)
Sodium: 139 mmol/L (ref 135–145)
Total Bilirubin: 0.6 mg/dL (ref 0.3–1.2)
Total Protein: 7.9 g/dL (ref 6.5–8.1)

## 2021-07-09 LAB — CBG MONITORING, ED: Glucose-Capillary: 114 mg/dL — ABNORMAL HIGH (ref 70–99)

## 2021-07-09 LAB — APTT: aPTT: 30 seconds (ref 24–36)

## 2021-07-09 LAB — PROTIME-INR
INR: 1.1 (ref 0.8–1.2)
Prothrombin Time: 13.7 seconds (ref 11.4–15.2)

## 2021-07-09 LAB — RESP PANEL BY RT-PCR (FLU A&B, COVID) ARPGX2
Influenza A by PCR: NEGATIVE
Influenza B by PCR: NEGATIVE
SARS Coronavirus 2 by RT PCR: NEGATIVE

## 2021-07-09 MED ORDER — METOCLOPRAMIDE HCL 5 MG/ML IJ SOLN
10.0000 mg | Freq: Once | INTRAMUSCULAR | Status: AC
  Administered 2021-07-09: 10 mg via INTRAVENOUS
  Filled 2021-07-09: qty 2

## 2021-07-09 MED ORDER — BUTALBITAL-APAP-CAFFEINE 50-325-40 MG PO TABS: 1.0000 | ORAL_TABLET | Freq: Four times a day (QID) | ORAL | 0 refills | Status: DC | PRN

## 2021-07-09 MED ORDER — DIPHENHYDRAMINE HCL 50 MG/ML IJ SOLN
25.0000 mg | Freq: Once | INTRAMUSCULAR | Status: AC
  Administered 2021-07-09: 25 mg via INTRAVENOUS
  Filled 2021-07-09: qty 1

## 2021-07-09 MED ORDER — DIPHENHYDRAMINE HCL 50 MG/ML IJ SOLN
50.0000 mg | Freq: Once | INTRAMUSCULAR | Status: AC
  Administered 2021-07-09: 50 mg via INTRAVENOUS

## 2021-07-09 MED ORDER — LORAZEPAM 2 MG/ML IJ SOLN
2.0000 mg | Freq: Once | INTRAMUSCULAR | Status: AC
  Administered 2021-07-09: 2 mg via INTRAVENOUS
  Filled 2021-07-09: qty 1

## 2021-07-09 MED ORDER — DIPHENHYDRAMINE HCL 50 MG/ML IJ SOLN
INTRAMUSCULAR | Status: AC
  Administered 2021-07-09: 50 mg via INTRAVENOUS
  Filled 2021-07-09: qty 1

## 2021-07-09 MED ORDER — KETOROLAC TROMETHAMINE 30 MG/ML IJ SOLN
30.0000 mg | Freq: Once | INTRAMUSCULAR | Status: AC
  Administered 2021-07-09: 30 mg via INTRAVENOUS
  Filled 2021-07-09: qty 1

## 2021-07-09 MED ORDER — SODIUM CHLORIDE 0.9% FLUSH
3.0000 mL | Freq: Once | INTRAVENOUS | Status: AC
  Administered 2021-07-09: 3 mL via INTRAVENOUS

## 2021-07-09 MED ORDER — SODIUM CHLORIDE 0.9 % IV BOLUS: 1000.0000 mL | Freq: Once | INTRAVENOUS | Status: DC

## 2021-07-09 NOTE — ED Notes (Signed)
Code stroke cancelled per MD .

## 2021-07-09 NOTE — Consult Note (Signed)
Neurology Consultation  Reason for Consult: Speech difficulty, confusion, headache Referring Physician: Dr. Shirlyn Goltz  CC: Speech difficulty, headache, confusion  History is obtained from: Patient, chart, wife at bedside  HPI: Jason [name] is a 65 y.o. male past medical history of hyperlipidemia, hypertension, diabetes, Bell's palsy with no residual deficits many years ago, presenting for evaluation of episode of difficulty with his speech and confusion. Wife reports that he has been complaining of a headache for about a week or 10 days which gets relieved with Aleve but then returns back in about 8 to 10 hours after the medication.  Today also he was complaining of headache in the morning.  All of a sudden at around 6:45 PM, he stopped talking and appeared confused.  He was not able to respond to questions correctly.  EMS was called.  They noted him to not be able to respond to questions initially and when they activated a code stroke for aphasia, he came around some and started responding albeit slowly. He had no motor or sensory deficits.  No cranial nerve deficits noted by EMS on initial evaluation. On my history taking, he tells me that he is still having a headache which is frontal in location and dull headache at times and throbbing at times.  Could not tell me really any aggravating or relieving factors. No history of prior strokes. No sick contacts.  No preceding fevers chills shortness of breath nausea vomiting.  No urinary complaints.  Appetite has been okay. No jaw claudication.  No visual symptoms.  No generalized fatigue symptoms   LKW: 6:45 PM today tpa given?: no, nonfocal examination, low NIH stroke scale Premorbid modified Rankin scale (mRS): 0 ROS: Full ROS was performed and is negative except as noted in the HPI.  Past Medical History:  Diagnosis Date   Bell palsy    Diabetes mellitus without complication (HCC)    HTN (hypertension)    Hyperglycemia          Family History  Problem Relation Age of Onset   Stroke Mother    Colon cancer Brother    High blood pressure Brother    Stomach cancer Brother    Colon cancer Other        1st degree relative <60   Diabetes Other        1st degree relative    Stroke Other    Colon cancer Sister    High blood pressure Sister    Esophageal cancer Neg Hx    Rectal cancer Neg Hx      Social History:   reports that he has never smoked. He has never used smokeless tobacco. He reports current alcohol use of about 6.0 standard drinks of alcohol per week. He reports that he does not use drugs.  Medications  Current Facility-Administered Medications:    sodium chloride flush (NS) 0.9 % injection 3 mL, 3 mL, Intravenous, Once, Drenda Freeze, MD  Current Outpatient Medications:    Blood Glucose Monitoring Suppl (ONE TOUCH ULTRA 2) w/Device KIT, Use the device daily to check your blood sugars 1-4 times as instructed., Disp: 1 kit, Rfl: 0   diltiazem (CARDIZEM CD) 120 MG 24 hr capsule, TAKE 1 CAPSULE BY MOUTH EVERY DAY, Disp: 90 capsule, Rfl: 3   glipiZIDE (GLUCOTROL XL) 10 MG 24 hr tablet, Take 1 tablet (10 mg total) by mouth daily with breakfast., Disp: 90 tablet, Rfl: 0   glucose blood (ONE TOUCH ULTRA TEST) test strip, USE  TO TEST BLOOD GLUCOSE TWICE DAILY, Disp: 200 each, Rfl: 3   JARDIANCE 25 MG TABS tablet, TAKE 1 TABLET (25 MG TOTAL) BY MOUTH DAILY., Disp: 90 tablet, Rfl: 0   Lancets (ONETOUCH ULTRASOFT) lancets, Use to check blood sugar BID, Disp: 100 each, Rfl: 12   metFORMIN (GLUCOPHAGE XR) 500 MG 24 hr tablet, Take 2 tablets (1,000 mg total) by mouth in the morning and at bedtime., Disp: 360 tablet, Rfl: 0   rosuvastatin (CRESTOR) 5 MG tablet, Take 1 tablet (5 mg total) by mouth at bedtime., Disp: 90 tablet, Rfl: 3   Exam: Current vital signs: There were no vitals taken for this visit. Vital signs in last 24 hours: BP: ()/()  Arterial Line BP: ()/()   Blood pressure systolic  761. CBG 92 Respiratory rate 20 O2 saturations 99% General: Awake alert in no distress HEENT: Normocephalic/atraumatic Lungs: Clear Cardiovascular: Regular rate rhythm Abdomen soft nondistended nontender Extremities warm well perfused with no edema Neurological exam Awake alert oriented x3 Slow to respond to questions, mildly diminished attention concentration. No evidence of dysarthria No evidence of aphasia Able to follow commands, name and comprehend. Speech is fluen Cranial nerve examination: CN II to XII intact Motor examination 5/5 with no drift in any of the 4 extremities Sensation intact light touch all over without extinction Coordination with no dysmetria Gait testing deferred at this time NIH stroke scale-0  Labs I have reviewed labs in epic and the results pertinent to this consultation are:   CBC    Component Value Date/Time   WBC 8.9 07/09/2021 1922   RBC 4.75 07/09/2021 1922   HGB 16.0 07/09/2021 1926   HCT 47.0 07/09/2021 1926   PLT 192 07/09/2021 1922   MCV 97.9 07/09/2021 1922   MCH 32.2 07/09/2021 1922   MCHC 32.9 07/09/2021 1922   RDW 13.7 07/09/2021 1922   LYMPHSABS 4.7 (H) 06/17/2021 0831   MONOABS 0.8 06/17/2021 0831   EOSABS 0.1 06/17/2021 0831   BASOSABS 0.0 06/17/2021 0831    CMP     Component Value Date/Time   NA 141 07/09/2021 1926   K 4.0 07/09/2021 1926   CL 107 07/09/2021 1926   CO2 27 06/17/2021 0831   GLUCOSE 113 (H) 07/09/2021 1926   BUN 15 07/09/2021 1926   CREATININE 1.10 07/09/2021 1926   CALCIUM 9.6 06/17/2021 0831   PROT 8.0 06/17/2021 0831   ALBUMIN 3.8 06/17/2021 0831   AST 34 06/17/2021 0831   ALT 35 06/17/2021 0831   ALKPHOS 75 06/17/2021 0831   BILITOT 0.4 06/17/2021 0831   GFRNONAA >60 05/25/2011 0449   GFRAA >60 05/25/2011 0449    Imaging I have reviewed the images obtained:  CT-head noncontrast- ASPECTS 10, no bleed.  Assessment:  65/M with above past medical history presenting for evaluation of  episode of difficulty with his speech and some confusion.  Has been having a headache now going on for a few weeks, but this evening was noted to have some difficulty with the speech and confusion. On my examination, the NIH stroke scale was 0 although he was a little slow to respond to questions. He endorsed a headache. Likely a complex migraine. Less likely to be giant cell arteritis given no history of jaw claudication or temporal tenderness. At this point, I would treat with medications for complex migraine and having follow-up outpatient.  I did a stat MRI of the brain given the acute presentation-Limited stroke protocol MRI negative for acute stroke.  Recommendations:  Treat with migraine cocktail Reevaluate after the migraine cocktail-if symptoms improve, can be discharged home. Follow-up with outpatient neurology in 4 to 6 weeks.  Addendum According to Dr. Darl Householder, symptoms improved after migraine cocktail. Can be discharged home with outpatient follow-up with neurology in 4 to 6 weeks for his headaches-Dr. Lavell Anchors at [facility] neurology.   -- Amie Portland, MD Neurologist [facility] Pager: [phone]

## 2021-07-09 NOTE — Discharge Instructions (Addendum)
Take Tylenol or Motrin for headache  Take Fioricet for severe headaches  See neurology for follow-up.  You should avoid taking Reglan and she had a severe side effect from it  Return to ER if you have worse headache, trouble speaking, vomiting

## 2021-07-09 NOTE — Code Documentation (Signed)
Stroke Response Nurse Documentation Code Documentation  Jason [name] is a 65 y.o. male arriving to [place] ED via [facility] on 7/29 with past medical hx of HTN, DM2. Code stroke was activated by EMS. Patient from home where he was LKW at [place] and now complaining of slurred speech and headache . On No antithrombotic. Stroke team at the bedside on patient arrival. Labs drawn and patient cleared for CT by Dr. Darl Householder. Patient to CT with team. NIHSS 0, see documentation for details and code stroke times. Patient with no deficits on exam. The following imaging was completed:  CT/MRI. Patient is not a candidate for tPA due to no fixed neurological deficit.  Bedside handoff with ED RN Chloe.    Madelynn Done  Rapid Response RN

## 2021-07-09 NOTE — ED Triage Notes (Signed)
Pt bib [facility] from home d/t headache, confusion and abnormal speech. Per ems, pt was not able to speak upon their arrival. However, symptoms have improved en route to hospital. Pt was A&O * 4 upon arrival. Vitals are within normal limit with ems.

## 2021-07-09 NOTE — ED Provider Notes (Signed)
[facility] EMERGENCY DEPARTMENT Provider Note   CSN: [id number] Arrival date & time: 07/09/21  1919     History Chief Complaint  Patient presents with   Code Stroke    JEMMIE RHINEHART is a 65 y.o. male hx of DM, HTN, here presenting with headache and trouble speaking.  Patient had headache all day and a cute onset of aphasia around 6:30 PM.  EMS got there and he is speech is gradually improving.  Code stroke was activated.  Per chart review, patient does not have a history of stroke and is not on blood thinners   The history is provided by the patient.      Past Medical History:  Diagnosis Date   Bell palsy    Diabetes mellitus without complication ([place])    HTN (hypertension)    Hyperglycemia     Patient Active Problem List   Diagnosis Date Noted   Routine general medical examination at a health care facility 02/02/2016   Type 2 diabetes mellitus ([place]) 06/12/2012   Syncope - neurally mediated 07/04/2011   Essential hypertension 12/24/2007   HYPERGLYCEMIA 12/24/2007    Past Surgical History:  Procedure Laterality Date   bellpalsy     COLONOSCOPY     COLONOSCOPY W/ POLYPECTOMY         Family History  Problem Relation Age of Onset   Stroke Mother    Colon cancer Brother    High blood pressure Brother    Stomach cancer Brother    Colon cancer Other        1st degree relative <60   Diabetes Other        1st degree relative    Stroke Other    Colon cancer Sister    High blood pressure Sister    Esophageal cancer Neg Hx    Rectal cancer Neg Hx     Social History   Tobacco Use   Smoking status: Never   Smokeless tobacco: Never  Vaping Use   Vaping Use: Never used  Substance Use Topics   Alcohol use: Yes    Alcohol/week: 6.0 standard drinks    Types: 6 Cans of beer per week    Comment: on weekends   Drug use: No    Home Medications Prior to Admission medications   Medication Sig Start Date End Date Taking? Authorizing  Provider  Blood Glucose Monitoring Suppl (ONE TOUCH ULTRA 2) w/Device KIT Use the device daily to check your blood sugars 1-4 times as instructed. 05/19/20   Nafziger, Tommi Rumps, NP  diltiazem (CARDIZEM CD) 120 MG 24 hr capsule TAKE 1 CAPSULE BY MOUTH EVERY DAY 06/17/21   Nafziger, Tommi Rumps, NP  glipiZIDE (GLUCOTROL XL) 10 MG 24 hr tablet Take 1 tablet (10 mg total) by mouth daily with breakfast. 06/17/21   Nafziger, Tommi Rumps, NP  glucose blood (ONE TOUCH ULTRA TEST) test strip USE TO TEST BLOOD GLUCOSE TWICE DAILY 06/17/21   Nafziger, Tommi Rumps, NP  JARDIANCE 25 MG TABS tablet TAKE 1 TABLET (25 MG TOTAL) BY MOUTH DAILY. 05/06/21   Nafziger, Tommi Rumps, NP  Lancets [facility] ULTRASOFT) lancets Use to check blood sugar BID 06/17/21   Nafziger, Tommi Rumps, NP  metFORMIN (GLUCOPHAGE XR) 500 MG 24 hr tablet Take 2 tablets (1,000 mg total) by mouth in the morning and at bedtime. 06/17/21 09/15/21  Nafziger, Tommi Rumps, NP  rosuvastatin (CRESTOR) 5 MG tablet Take 1 tablet (5 mg total) by mouth at bedtime. 06/17/21   [name], NP  Allergies    Lisinopril and Reglan [metoclopramide]  Review of Systems   Review of Systems  Neurological:  Positive for speech difficulty and headaches.  All other systems reviewed and are negative.  Physical Exam Updated Vital Signs BP (!) 148/83   Pulse 95   Temp 97.6 F (36.4 C) (Oral)   Resp 20   SpO2 100%   Physical Exam Vitals and nursing note reviewed.  Constitutional:      Appearance: Normal appearance.  HENT:     Head: Normocephalic.     Nose: Nose normal.     Mouth/Throat:     Mouth: Mucous membranes are moist.  Eyes:     Extraocular Movements: Extraocular movements intact.     Pupils: Pupils are equal, round, and reactive to light.  Cardiovascular:     Rate and Rhythm: Normal rate and regular rhythm.     Pulses: Normal pulses.     Heart sounds: Normal heart sounds.  Pulmonary:     Effort: Pulmonary effort is normal.     Breath sounds: Normal breath sounds.  Abdominal:     General:  Abdomen is flat.     Palpations: Abdomen is soft.  Musculoskeletal:        General: Normal range of motion.     Cervical back: Normal range of motion and neck supple.  Skin:    General: Skin is warm.     Capillary Refill: Capillary refill takes less than 2 seconds.  Neurological:     Mental Status: He is alert.     Comments: No obvious facial droop.  Patient's speech is slightly slowed and mildly slurred.  Patient has normal strength bilateral arms and legs.  Psychiatric:        Mood and Affect: Mood normal.        Behavior: Behavior normal.    ED Results / Procedures / Treatments   Labs (all labs ordered are listed, but only abnormal results are displayed) Labs Reviewed  DIFFERENTIAL - Abnormal; Notable for the following components:      Result Value   Lymphs Abs 5.3 (*)    Basophils Absolute 0.2 (*)    All other components within normal limits  COMPREHENSIVE METABOLIC PANEL - Abnormal; Notable for the following components:   Glucose, Bld 115 (*)    All other components within normal limits  I-STAT CHEM 8, ED - Abnormal; Notable for the following components:   Glucose, Bld 113 (*)    All other components within normal limits  CBG MONITORING, ED - Abnormal; Notable for the following components:   Glucose-Capillary 114 (*)    All other components within normal limits  RESP PANEL BY RT-PCR (FLU A&B, COVID) [id number]  PROTIME-INR  APTT  CBC    EKG None  Radiology MR BRAIN WO CONTRAST  Result Date: 07/09/2021 CLINICAL DATA:  Neuro deficit, acute stroke suspected. EXAM: MRI HEAD WITHOUT CONTRAST TECHNIQUE: Multiplanar, multiecho pulse sequences of the brain and surrounding structures were obtained without intravenous contrast. COMPARISON:  Same day head CT. FINDINGS: Motion limited evaluation.  Within limitation: Brain: No acute infarction, hemorrhage, hydrocephalus, extra-axial collection or mass lesion. Mild scattered T2 hyperintensities in the white matter, nonspecific but  most likely related to mild chronic microvascular ischemic disease. Vascular: Major arterial flow voids are maintained at the skull base. Skull and upper cervical spine: Normal marrow signal. Sinuses/Orbits: Small left maxillary sinus retention cyst. Trace ethmoid air cell and frontal sinus mucosal thickening. No acute orbital findings. Other: No  sizable mastoid effusions. IMPRESSION: 1. Motion limited exam without evidence of acute intracranial abnormality. Specifically, no acute infarct. 2. Mild chronic microvascular ischemic disease. Electronically Signed   By: Margaretha Sheffield MD   On: 07/09/2021 20:13   CT HEAD CODE STROKE WO CONTRAST  Result Date: 07/09/2021 CLINICAL DATA:  Code stroke. Neuro deficit, acute, stroke suspected. Additional history provided: Headache and slurred speech. EXAM: CT HEAD WITHOUT CONTRAST TECHNIQUE: Contiguous axial images were obtained from the base of the skull through the vertex without intravenous contrast. COMPARISON:  Head CT 12/11/2007.  Brain MRI 03/08/2004. FINDINGS: Brain: The examination is significantly motion degraded at the level of the maxillofacial structures and skull base. Cerebral volume is normal for age. There is no acute intracranial hemorrhage. No demarcated cortical infarct. No extra-axial fluid collection. No evidence of an intracranial mass. No midline shift. Vascular: No hyperdense vessel. Skull: No calvarial fracture or focal suspicious calvarial lesion. Sinuses/Orbits: Visualized orbits show no acute finding. Left maxillary sinus mucous retention cyst. Trace mucosal thickening within the bilateral ethmoid air cells. ASPECTS [facility] Stroke Program Early CT Score) - Ganglionic level infarction (caudate, lentiform nuclei, internal capsule, insula, M1-M3 cortex): 7 - Supraganglionic infarction (M4-M6 cortex): 3 Total score (0-10 with 10 being normal): 10 These results were communicated to Dr. Rory Percy at 7:40 pmon 7/29/2022by text page via the [facility]  messaging system. IMPRESSION: Significantly motion degraded examination at the level of the maxillofacial structures and skull base. No evidence of acute intracranial abnormality Paranasal sinus disease, as described. Electronically Signed   By: Kellie Simmering DO   On: 07/09/2021 19:40    Procedures Procedures   Medications Ordered in ED Medications  sodium chloride 0.9 % bolus 1,000 mL (has no administration in time range)  diphenhydrAMINE (BENADRYL) 50 MG/ML injection (has no administration in time range)  sodium chloride flush (NS) 0.9 % injection 3 mL (3 mLs Intravenous Given 07/09/21 2105)  metoCLOPramide (REGLAN) injection 10 mg (10 mg Intravenous Given 07/09/21 2100)  diphenhydrAMINE (BENADRYL) injection 25 mg (25 mg Intravenous Given 07/09/21 2101)  ketorolac (TORADOL) 30 MG/ML injection 30 mg (30 mg Intravenous Given 07/09/21 2101)  diphenhydrAMINE (BENADRYL) injection 50 mg (50 mg Intravenous Given 07/09/21 2117)  LORazepam (ATIVAN) injection 2 mg (2 mg Intravenous Given 07/09/21 2138)    ED Course  I have reviewed the triage vital signs and the nursing notes.  Pertinent labs & imaging results that were available during my care of the patient were reviewed by me and considered in my medical decision making (see chart for details).    MDM Rules/Calculators/A&P                          QUASHAWN JEWKES is a 65 y.o. male who presented with altered mental status.  Likely complex migraine versus TIA.  Neurology saw patient and will hold off on tPA right now given improving symptoms.  We will get CT head and MRI brain.  10:18 PM CT head and MRI unremarkable.  Given migraine cocktail initially but had a bad reaction with Reglan.  He was given Benadryl and Ativan and is more comfortable.  Will discharge home.  He states that he does not want to get Reglan again.   Final Clinical Impression(s) / ED Diagnoses Final diagnoses:  None    Rx / DC Orders ED Discharge Orders     None         Drenda Freeze, MD 07/09/21 2220

## 2021-07-10 ENCOUNTER — Inpatient Hospital Stay (HOSPITAL_COMMUNITY)
Admission: EM | Admit: 2021-07-10 | Discharge: 2021-07-15 | DRG: 075 | Disposition: A | Discharge status: Home / Self Care | Payer: Medicare Other | Attending: Internal Medicine | Admitting: Internal Medicine

## 2021-07-10 ENCOUNTER — Emergency Department (HOSPITAL_COMMUNITY): Payer: Medicare Other

## 2021-07-10 ENCOUNTER — Other Ambulatory Visit: Payer: Self-pay

## 2021-07-10 ENCOUNTER — Encounter (HOSPITAL_COMMUNITY): Payer: Self-pay | Admitting: Emergency Medicine

## 2021-07-10 ENCOUNTER — Inpatient Hospital Stay (HOSPITAL_COMMUNITY): Payer: Medicare Other

## 2021-07-10 DIAGNOSIS — B003 Herpesviral meningitis: Secondary | ICD-10-CM | POA: Diagnosis present

## 2021-07-10 DIAGNOSIS — Z20822 Contact with and (suspected) exposure to covid-19: Secondary | ICD-10-CM | POA: Diagnosis present

## 2021-07-10 DIAGNOSIS — E669 Obesity, unspecified: Secondary | ICD-10-CM | POA: Diagnosis present

## 2021-07-10 DIAGNOSIS — E1165 Type 2 diabetes mellitus with hyperglycemia: Secondary | ICD-10-CM | POA: Diagnosis present

## 2021-07-10 DIAGNOSIS — R4182 Altered mental status, unspecified: Secondary | ICD-10-CM | POA: Diagnosis present

## 2021-07-10 DIAGNOSIS — R29701 NIHSS score 1: Secondary | ICD-10-CM | POA: Diagnosis present

## 2021-07-10 DIAGNOSIS — E785 Hyperlipidemia, unspecified: Secondary | ICD-10-CM | POA: Diagnosis present

## 2021-07-10 DIAGNOSIS — R29810 Facial weakness: Secondary | ICD-10-CM | POA: Diagnosis present

## 2021-07-10 DIAGNOSIS — R4701 Aphasia: Secondary | ICD-10-CM | POA: Diagnosis present

## 2021-07-10 DIAGNOSIS — F1210 Cannabis abuse, uncomplicated: Secondary | ICD-10-CM | POA: Diagnosis present

## 2021-07-10 DIAGNOSIS — G928 Other toxic encephalopathy: Secondary | ICD-10-CM | POA: Diagnosis not present

## 2021-07-10 DIAGNOSIS — R519 Headache, unspecified: Secondary | ICD-10-CM | POA: Diagnosis present

## 2021-07-10 DIAGNOSIS — F1220 Cannabis dependence, uncomplicated: Secondary | ICD-10-CM | POA: Diagnosis not present

## 2021-07-10 DIAGNOSIS — G4730 Sleep apnea, unspecified: Secondary | ICD-10-CM | POA: Diagnosis not present

## 2021-07-10 DIAGNOSIS — Z833 Family history of diabetes mellitus: Secondary | ICD-10-CM

## 2021-07-10 DIAGNOSIS — I10 Essential (primary) hypertension: Secondary | ICD-10-CM | POA: Diagnosis present

## 2021-07-10 DIAGNOSIS — Z79899 Other long term (current) drug therapy: Secondary | ICD-10-CM

## 2021-07-10 DIAGNOSIS — G039 Meningitis, unspecified: Secondary | ICD-10-CM

## 2021-07-10 DIAGNOSIS — E6609 Other obesity due to excess calories: Secondary | ICD-10-CM | POA: Diagnosis not present

## 2021-07-10 DIAGNOSIS — Z823 Family history of stroke: Secondary | ICD-10-CM | POA: Diagnosis not present

## 2021-07-10 DIAGNOSIS — G9341 Metabolic encephalopathy: Secondary | ICD-10-CM | POA: Diagnosis not present

## 2021-07-10 DIAGNOSIS — Z800 Family history of malignant neoplasm of digestive organs: Secondary | ICD-10-CM | POA: Diagnosis not present

## 2021-07-10 DIAGNOSIS — Z6830 Body mass index (BMI) 30.0-30.9, adult: Secondary | ICD-10-CM

## 2021-07-10 DIAGNOSIS — Z7984 Long term (current) use of oral hypoglycemic drugs: Secondary | ICD-10-CM | POA: Diagnosis not present

## 2021-07-10 DIAGNOSIS — E119 Type 2 diabetes mellitus without complications: Secondary | ICD-10-CM

## 2021-07-10 LAB — AMMONIA: Ammonia: 22 umol/L (ref 9–35)

## 2021-07-10 LAB — LIPID PANEL
Cholesterol: 150 mg/dL (ref 0–200)
HDL: 61 mg/dL (ref >40)
LDL Cholesterol: 79 mg/dL (ref 0–99)
Total CHOL/HDL Ratio: 2.5 RATIO
Triglycerides: 49 mg/dL (ref <150)
VLDL: 10 mg/dL (ref 0–40)

## 2021-07-10 LAB — CBC WITH DIFFERENTIAL/PLATELET
Abs Immature Granulocytes: 0.05 10*3/uL (ref 0.00–0.07)
Basophils Absolute: 0 10*3/uL (ref 0.0–0.1)
Basophils Relative: 0 %
Eosinophils Absolute: 0 10*3/uL (ref 0.0–0.5)
Eosinophils Relative: 0 %
HCT: 44.3 % (ref 39.0–52.0)
Hemoglobin: 14.7 g/dL (ref 13.0–17.0)
Immature Granulocytes: 1 %
Lymphocytes Relative: 12 %
Lymphs Abs: 1.2 10*3/uL (ref 0.7–4.0)
MCH: 32.4 pg (ref 26.0–34.0)
MCHC: 33.2 g/dL (ref 30.0–36.0)
MCV: 97.6 fL (ref 80.0–100.0)
Monocytes Absolute: 0.3 10*3/uL (ref 0.1–1.0)
Monocytes Relative: 3 %
Neutro Abs: 8.3 10*3/uL — ABNORMAL HIGH (ref 1.7–7.7)
Neutrophils Relative %: 84 %
Platelets: 189 10*3/uL (ref 150–400)
RBC: 4.54 MIL/uL (ref 4.22–5.81)
RDW: 13.9 % (ref 11.5–15.5)
WBC: 9.9 10*3/uL (ref 4.0–10.5)
nRBC: 0 % (ref 0.0–0.2)

## 2021-07-10 LAB — I-STAT ARTERIAL BLOOD GAS, ED
Acid-base deficit: 2 mmol/L (ref 0.0–2.0)
Bicarbonate: 23.2 mmol/L (ref 20.0–28.0)
Calcium, Ion: 1.28 mmol/L (ref 1.15–1.40)
HCT: 42 % (ref 39.0–52.0)
Hemoglobin: 14.3 g/dL (ref 13.0–17.0)
O2 Saturation: 93 %
Potassium: 5.1 mmol/L (ref 3.5–5.1)
Sodium: 138 mmol/L (ref 135–145)
TCO2: 24 mmol/L (ref 22–32)
pCO2 arterial: 40.4 mmHg (ref 32.0–48.0)
pH, Arterial: 7.366 (ref 7.350–7.450)
pO2, Arterial: 71 mmHg — ABNORMAL LOW (ref 83.0–108.0)

## 2021-07-10 LAB — URINALYSIS, ROUTINE W REFLEX MICROSCOPIC
Bacteria, UA: NONE SEEN
Bilirubin Urine: NEGATIVE
Glucose, UA: >=500 mg/dL — AB
Hgb urine dipstick: NEGATIVE
Ketones, ur: 5 mg/dL — AB
Leukocytes,Ua: NEGATIVE
Nitrite: NEGATIVE
Protein, ur: NEGATIVE mg/dL
Specific Gravity, Urine: 1.032 — ABNORMAL HIGH (ref 1.005–1.030)
pH: 7 (ref 5.0–8.0)

## 2021-07-10 LAB — BASIC METABOLIC PANEL
Anion gap: 8 (ref 5–15)
BUN: 17 mg/dL (ref 8–23)
CO2: 25 mmol/L (ref 22–32)
Calcium: 9.3 mg/dL (ref 8.9–10.3)
Chloride: 106 mmol/L (ref 98–111)
Creatinine, Ser: 1.35 mg/dL — ABNORMAL HIGH (ref 0.61–1.24)
GFR, Estimated: 58 mL/min — ABNORMAL LOW (ref >60)
Glucose, Bld: 105 mg/dL — ABNORMAL HIGH (ref 70–99)
Potassium: 4.1 mmol/L (ref 3.5–5.1)
Sodium: 139 mmol/L (ref 135–145)

## 2021-07-10 LAB — C-REACTIVE PROTEIN: CRP: <0.5 mg/dL (ref <1.0)

## 2021-07-10 LAB — RAPID URINE DRUG SCREEN, HOSP PERFORMED
Amphetamines: NOT DETECTED
Barbiturates: NOT DETECTED
Benzodiazepines: NOT DETECTED
Cocaine: NOT DETECTED
Opiates: NOT DETECTED
Tetrahydrocannabinol: POSITIVE — AB

## 2021-07-10 LAB — ACETAMINOPHEN LEVEL: Acetaminophen (Tylenol), Serum: <10 ug/mL — ABNORMAL LOW (ref 10–30)

## 2021-07-10 LAB — GLUCOSE, CAPILLARY
Glucose-Capillary: 97 mg/dL (ref 70–99)
Glucose-Capillary: 116 mg/dL — ABNORMAL HIGH (ref 70–99)

## 2021-07-10 LAB — CBG MONITORING, ED: Glucose-Capillary: 102 mg/dL — ABNORMAL HIGH (ref 70–99)

## 2021-07-10 LAB — SALICYLATE LEVEL: Salicylate Lvl: <7 mg/dL — ABNORMAL LOW (ref 7.0–30.0)

## 2021-07-10 LAB — RESP PANEL BY RT-PCR (FLU A&B, COVID) ARPGX2
Influenza A by PCR: NEGATIVE
Influenza B by PCR: NEGATIVE
SARS Coronavirus 2 by RT PCR: NEGATIVE

## 2021-07-10 LAB — ETHANOL: Alcohol, Ethyl (B): <10 mg/dL (ref <10)

## 2021-07-10 LAB — PROCALCITONIN: Procalcitonin: <0.1 ng/mL

## 2021-07-10 LAB — VITAMIN B12: Vitamin B-12: 219 pg/mL (ref 180–914)

## 2021-07-10 LAB — HIV ANTIBODY (ROUTINE TESTING W REFLEX): HIV Screen 4th Generation wRfx: NONREACTIVE

## 2021-07-10 LAB — TSH: TSH: 2.119 u[IU]/mL (ref 0.350–4.500)

## 2021-07-10 LAB — SEDIMENTATION RATE: Sed Rate: 8 mm/hr (ref 0–16)

## 2021-07-10 LAB — BRAIN NATRIURETIC PEPTIDE: B Natriuretic Peptide: 21.1 pg/mL (ref 0.0–100.0)

## 2021-07-10 LAB — MAGNESIUM: Magnesium: 2.1 mg/dL (ref 1.7–2.4)

## 2021-07-10 MED ORDER — VITAMIN B-12 1000 MCG PO TABS
2000.0000 ug | ORAL_TABLET | Freq: Every day | ORAL | Status: DC
  Administered 2021-07-10: 2000 ug via ORAL
  Filled 2021-07-10: qty 2

## 2021-07-10 MED ORDER — INSULIN ASPART 100 UNIT/ML IJ SOLN
0.0000 [IU] | Freq: Three times a day (TID) | INTRAMUSCULAR | Status: DC
  Administered 2021-07-11 – 2021-07-12 (×4): 2 [IU] via SUBCUTANEOUS
  Administered 2021-07-12 – 2021-07-13 (×4): 3 [IU] via SUBCUTANEOUS
  Administered 2021-07-13: 8 [IU] via SUBCUTANEOUS
  Administered 2021-07-14: 2 [IU] via SUBCUTANEOUS
  Administered 2021-07-14: 5 [IU] via SUBCUTANEOUS
  Administered 2021-07-14 – 2021-07-15 (×3): 3 [IU] via SUBCUTANEOUS

## 2021-07-10 MED ORDER — INSULIN ASPART 100 UNIT/ML IJ SOLN
0.0000 [IU] | Freq: Every day | INTRAMUSCULAR | Status: DC
Start: 2021-07-10 — End: 2021-07-15

## 2021-07-10 MED ORDER — ENSURE ENLIVE PO LIQD
237.0000 mL | Freq: Two times a day (BID) | ORAL | Status: DC
  Administered 2021-07-11 – 2021-07-15 (×7): 237 mL via ORAL

## 2021-07-10 MED ORDER — IOHEXOL 350 MG/ML SOLN
75.0000 mL | Freq: Once | INTRAVENOUS | Status: AC | PRN
  Administered 2021-07-10: 75 mL via INTRAVENOUS

## 2021-07-10 MED ORDER — ROSUVASTATIN CALCIUM 5 MG PO TABS
5.0000 mg | ORAL_TABLET | Freq: Every day | ORAL | Status: DC
  Administered 2021-07-10 – 2021-07-14 (×5): 5 mg via ORAL
  Filled 2021-07-10 (×5): qty 1

## 2021-07-10 MED ORDER — VANCOMYCIN HCL 1250 MG/250ML IV SOLN
1250.0000 mg | Freq: Two times a day (BID) | INTRAVENOUS | Status: DC
  Administered 2021-07-10: 1250 mg via INTRAVENOUS
  Filled 2021-07-10 (×2): qty 250

## 2021-07-10 MED ORDER — SODIUM CHLORIDE 0.9 % IV SOLN: INTRAVENOUS | Status: DC

## 2021-07-10 MED ORDER — CYANOCOBALAMIN 1000 MCG/ML IJ SOLN
1000.0000 ug | Freq: Every day | INTRAMUSCULAR | Status: DC
  Administered 2021-07-10 – 2021-07-15 (×6): 1000 ug via INTRAMUSCULAR
  Filled 2021-07-10 (×6): qty 1

## 2021-07-10 MED ORDER — VANCOMYCIN HCL 2000 MG/400ML IV SOLN
2000.0000 mg | Freq: Once | INTRAVENOUS | Status: AC
  Administered 2021-07-10: 2000 mg via INTRAVENOUS
  Filled 2021-07-10: qty 400

## 2021-07-10 MED ORDER — DEXTROSE 5 % IV SOLN
1000.0000 mg | Freq: Three times a day (TID) | INTRAVENOUS | Status: DC
  Administered 2021-07-10 – 2021-07-15 (×16): 1000 mg via INTRAVENOUS
  Filled 2021-07-10 (×20): qty 20

## 2021-07-10 MED ORDER — DILTIAZEM HCL ER COATED BEADS 120 MG PO CP24
120.0000 mg | ORAL_CAPSULE | Freq: Every day | ORAL | Status: DC
  Administered 2021-07-11 – 2021-07-15 (×5): 120 mg via ORAL
  Filled 2021-07-10 (×5): qty 1

## 2021-07-10 MED ORDER — SODIUM CHLORIDE 0.9 % IV SOLN
2.0000 g | Freq: Two times a day (BID) | INTRAVENOUS | Status: DC
  Administered 2021-07-10 – 2021-07-13 (×8): 2 g via INTRAVENOUS
  Filled 2021-07-10 (×5): qty 20
  Filled 2021-07-10 (×2): qty 2
  Filled 2021-07-10 (×3): qty 20
  Filled 2021-07-10: qty 2

## 2021-07-10 MED ORDER — SODIUM CHLORIDE 0.9 % IV SOLN: INTRAVENOUS | Status: AC

## 2021-07-10 MED ORDER — LORAZEPAM 2 MG/ML IJ SOLN
2.0000 mg | Freq: Once | INTRAMUSCULAR | Status: AC
  Administered 2021-07-12: 2 mg via INTRAVENOUS
  Filled 2021-07-10 (×2): qty 1

## 2021-07-10 MED ORDER — SODIUM CHLORIDE 0.9 % IV SOLN
2.0000 g | INTRAVENOUS | Status: DC
  Administered 2021-07-10 – 2021-07-13 (×18): 2 g via INTRAVENOUS
  Filled 2021-07-10 (×11): qty 2000
  Filled 2021-07-10: qty 2
  Filled 2021-07-10 (×5): qty 2000
  Filled 2021-07-10: qty 2
  Filled 2021-07-10 (×5): qty 2000

## 2021-07-10 MED ORDER — VITAMIN B-12 1000 MCG PO TABS: 2000.0000 ug | ORAL_TABLET | Freq: Every day | ORAL | Status: DC

## 2021-07-10 NOTE — ED Notes (Signed)
Pt got urine on bed linen while trying to use urinal. Pt provided with clean linens. Pt resting quietly in bed at this time, wife at bedside

## 2021-07-10 NOTE — Progress Notes (Addendum)
B12 came back low by neurological standards at 219 (desired level > 400).   UTOX came back positive for THC. Unknown how much THC in his system as the screen is not quantitiative. Excessive THC levels can cause somnolence/delirium and therefore this is also on the DDx.   Has been started on empiric meningitis-dose ABX after failed LP.   EEG:  This study is within normal limits. No seizures or epileptiform discharges were seen throughout the recording..   Starting patient on high-dose B12 supplementation. Will need follow up level as outpatient.   Electronically signed: Dr. Kerney Elbe

## 2021-07-10 NOTE — Procedures (Signed)
LUMBAR PUNCTURE (SPINAL TAP) PROCEDURE NOTE   Indication: AMS, r/o meningitis     Proceduralists: A. Rory Percy, MD   Risks of the procedure were dicussed with the patient including post-LP headache, bleeding, infection, weakness/numbness of legs(radiculopathy), death.    Consent obtained from: Girlfriend at bedside given emergent nature  Procedure Note: The patient was prepped and draped, and using sterile technique a 20 gauge quinke spinal needle was inserted in the L4-5 space.   No CSF return on 4 tries - patient was extremely agitated and did not cooperate, arching his back every time needle was inserted.   Negligible blood loss.  Will request IR guided LP in the AM.  -- Amie Portland, MD Neurologist [facility] Pager: [phone]

## 2021-07-10 NOTE — ED Provider Notes (Signed)
[facility] EMERGENCY DEPARTMENT Provider Note   CSN: [id number] Arrival date & time: 07/10/21  0115     History Chief Complaint  Patient presents with   Altered Mental Status   Headache    Jason [name] is a 65 y.o. male.  The history is provided by the spouse. The history is limited by the condition of the patient.  Altered Mental Status Presenting symptoms: behavior changes, confusion, disorientation and lethargy   Severity:  Moderate Most recent episode:  Yesterday Episode history:  Single Duration:  1 day Timing:  Constant Progression:  Worsening Chronicity:  New Context: not dementia   Associated symptoms: normal movement, no depression, no rash and no seizures   Associated symptoms comment:  Dry heaves this evening  Patient with DM who came is a code stroke with aphasia and a week of headache.  Stroke was ruled out and the patient was treated as a complex migraine and discharged.  Had dry heaves and was persistently altered. Headache for more than a week.  Has been taking a lot of BC powders     Past Medical History:  Diagnosis Date   Bell palsy    Diabetes mellitus without complication ([place])    HTN (hypertension)    Hyperglycemia     Patient Active Problem List   Diagnosis Date Noted   AMS (altered mental status) 07/10/2021   Routine general medical examination at a health care facility 02/02/2016   Type 2 diabetes mellitus ([place]) 06/12/2012   Syncope - neurally mediated 07/04/2011   Essential hypertension 12/24/2007   HYPERGLYCEMIA 12/24/2007    Past Surgical History:  Procedure Laterality Date   bellpalsy     COLONOSCOPY     COLONOSCOPY W/ POLYPECTOMY         Family History  Problem Relation Age of Onset   Stroke Mother    Colon cancer Brother    High blood pressure Brother    Stomach cancer Brother    Colon cancer Other        1st degree relative <60   Diabetes Other        1st degree relative    Stroke Other     Colon cancer Sister    High blood pressure Sister    Esophageal cancer Neg Hx    Rectal cancer Neg Hx     Social History   Tobacco Use   Smoking status: Never   Smokeless tobacco: Never  Vaping Use   Vaping Use: Never used  Substance Use Topics   Alcohol use: Yes    Alcohol/week: 6.0 standard drinks    Types: 6 Cans of beer per week    Comment: on weekends   Drug use: No    Home Medications Prior to Admission medications   Medication Sig Start Date End Date Taking? Authorizing Provider  Blood Glucose Monitoring Suppl (ONE TOUCH ULTRA 2) w/Device KIT Use the device daily to check your blood sugars 1-4 times as instructed. 05/19/20   Nafziger, Tommi Rumps, NP  butalbital-acetaminophen-caffeine (FIORICET) [phone] MG tablet Take 1 tablet by mouth every 6 (six) hours as needed for headache. 07/09/21 07/09/22  Drenda Freeze, MD  diltiazem (CARDIZEM CD) 120 MG 24 hr capsule TAKE 1 CAPSULE BY MOUTH EVERY DAY 06/17/21   Nafziger, Tommi Rumps, NP  glipiZIDE (GLUCOTROL XL) 10 MG 24 hr tablet Take 1 tablet (10 mg total) by mouth daily with breakfast. 06/17/21   Nafziger, Tommi Rumps, NP  glucose blood (ONE TOUCH ULTRA TEST)  test strip USE TO TEST BLOOD GLUCOSE TWICE DAILY 06/17/21   Nafziger, Tommi Rumps, NP  JARDIANCE 25 MG TABS tablet TAKE 1 TABLET (25 MG TOTAL) BY MOUTH DAILY. 05/06/21   Nafziger, Tommi Rumps, NP  Lancets [facility] ULTRASOFT) lancets Use to check blood sugar BID 06/17/21   Nafziger, Tommi Rumps, NP  metFORMIN (GLUCOPHAGE XR) 500 MG 24 hr tablet Take 2 tablets (1,000 mg total) by mouth in the morning and at bedtime. 06/17/21 09/15/21  Nafziger, Tommi Rumps, NP  rosuvastatin (CRESTOR) 5 MG tablet Take 1 tablet (5 mg total) by mouth at bedtime. 06/17/21   Nafziger, Tommi Rumps, NP    Allergies    Lisinopril and Reglan [metoclopramide]  Review of Systems   Review of Systems  Unable to perform ROS: Mental status change  HENT:  Negative for ear discharge and facial swelling.   Eyes:  Negative for redness.  Respiratory:  Negative for  wheezing and stridor.   Cardiovascular:  Negative for leg swelling.  Gastrointestinal:  Negative for diarrhea.  Musculoskeletal:  Negative for neck stiffness.  Skin:  Negative for rash.  Neurological:  Negative for seizures.  Psychiatric/Behavioral:  Positive for behavioral problems and confusion.    Physical Exam Updated Vital Signs BP 134/64   Pulse 73   Temp 100 F (37.8 C) (Oral)   Resp (!) 21   Wt 109.3 kg Comment: from 06/17/21  SpO2 96%   BMI 30.32 kg/m   Physical Exam Vitals and nursing note reviewed.  Constitutional:      Appearance: He is not diaphoretic.  HENT:     Head: Normocephalic and atraumatic.     Nose: Nose normal.     Mouth/Throat:     Mouth: Mucous membranes are moist.  Eyes:     Conjunctiva/sclera: Conjunctivae normal.     Pupils: Pupils are equal, round, and reactive to light.  Cardiovascular:     Rate and Rhythm: Normal rate and regular rhythm.     Pulses: Normal pulses.     Heart sounds: Normal heart sounds.  Pulmonary:     Effort: Pulmonary effort is normal.     Breath sounds: Normal breath sounds.  Abdominal:     General: Abdomen is flat. Bowel sounds are normal.     Palpations: Abdomen is soft.     Tenderness: There is no abdominal tenderness. There is no guarding.  Musculoskeletal:        General: Normal range of motion.     Cervical back: Neck supple. No rigidity.  Lymphadenopathy:     Cervical: No cervical adenopathy.  Skin:    General: Skin is warm and dry.     Capillary Refill: Capillary refill takes less than 2 seconds.  Neurological:     Deep Tendon Reflexes: Reflexes normal.  Psychiatric:     Comments: Unable     ED Results / Procedures / Treatments   Labs (all labs ordered are listed, but only abnormal results are displayed) Results for orders placed or performed during the hospital encounter of 07/10/21  Resp Panel by RT-PCR (Flu A&B, Covid) Nasopharyngeal Swab   Specimen: Nasopharyngeal Swab; Nasopharyngeal(NP) swabs  in vial transport medium  Result Value Ref Range   SARS Coronavirus 2 by RT PCR NEGATIVE NEGATIVE   Influenza A by PCR NEGATIVE NEGATIVE   Influenza B by PCR NEGATIVE NEGATIVE  Ethanol  Result Value Ref Range   Alcohol, Ethyl (B) <10 <10 mg/dL  Urinalysis, Routine w reflex microscopic Urine, Clean Catch  Result Value Ref Range  Color, Urine YELLOW YELLOW   APPearance CLEAR CLEAR   Specific Gravity, Urine 1.032 (H) 1.005 - 1.030   pH 7.0 5.0 - 8.0   Glucose, UA >=500 (A) NEGATIVE mg/dL   Hgb urine dipstick NEGATIVE NEGATIVE   Bilirubin Urine NEGATIVE NEGATIVE   Ketones, ur 5 (A) NEGATIVE mg/dL   Protein, ur NEGATIVE NEGATIVE mg/dL   Nitrite NEGATIVE NEGATIVE   Leukocytes,Ua NEGATIVE NEGATIVE   RBC / HPF 0-5 0 - 5 RBC/hpf   WBC, UA 0-5 0 - 5 WBC/hpf   Bacteria, UA NONE SEEN NONE SEEN   Squamous Epithelial / LPF 0-5 0 - 5  CBC with Differential/Platelet  Result Value Ref Range   WBC 9.9 4.0 - 10.5 K/uL   RBC 4.54 4.22 - 5.81 MIL/uL   Hemoglobin 14.7 13.0 - 17.0 g/dL   HCT 44.3 39.0 - 52.0 %   MCV 97.6 80.0 - 100.0 fL   MCH 32.4 26.0 - 34.0 pg   MCHC 33.2 30.0 - 36.0 g/dL   RDW 13.9 11.5 - 15.5 %   Platelets 189 150 - 400 K/uL   nRBC 0.0 0.0 - 0.2 %   Neutrophils Relative % 84 %   Neutro Abs 8.3 (H) 1.7 - 7.7 K/uL   Lymphocytes Relative 12 %   Lymphs Abs 1.2 0.7 - 4.0 K/uL   Monocytes Relative 3 %   Monocytes Absolute 0.3 0.1 - 1.0 K/uL   Eosinophils Relative 0 %   Eosinophils Absolute 0.0 0.0 - 0.5 K/uL   Basophils Relative 0 %   Basophils Absolute 0.0 0.0 - 0.1 K/uL   Immature Granulocytes 1 %   Abs Immature Granulocytes 0.05 0.00 - 0.07 K/uL  Rapid urine drug screen (hospital performed)  Result Value Ref Range   Opiates NONE DETECTED NONE DETECTED   Cocaine NONE DETECTED NONE DETECTED   Benzodiazepines NONE DETECTED NONE DETECTED   Amphetamines NONE DETECTED NONE DETECTED   Tetrahydrocannabinol POSITIVE (A) NONE DETECTED   Barbiturates NONE DETECTED NONE  DETECTED  Ammonia  Result Value Ref Range   Ammonia 22 9 - 35 umol/L  Vitamin B12  Result Value Ref Range   Vitamin B-12 219 180 - 914 pg/mL  TSH  Result Value Ref Range   TSH 2.119 0.350 - 4.500 uIU/mL  Acetaminophen level  Result Value Ref Range   Acetaminophen (Tylenol), Serum <10 (L) 10 - 30 ug/mL  Salicylate level  Result Value Ref Range   Salicylate Lvl <6.2 (L) 7.0 - 30.0 mg/dL  I-Stat arterial blood gas, ED  Result Value Ref Range   pH, Arterial 7.366 7.350 - 7.450   pCO2 arterial 40.4 32.0 - 48.0 mmHg   pO2, Arterial 71 (L) 83.0 - 108.0 mmHg   Bicarbonate 23.2 20.0 - 28.0 mmol/L   TCO2 24 22 - 32 mmol/L   O2 Saturation 93.0 %   Acid-base deficit 2.0 0.0 - 2.0 mmol/L   Sodium 138 135 - 145 mmol/L   Potassium 5.1 3.5 - 5.1 mmol/L   Calcium, Ion 1.28 1.15 - 1.40 mmol/L   HCT 42.0 39.0 - 52.0 %   Hemoglobin 14.3 13.0 - 17.0 g/dL   Collection site Radial    Drawn by RT    Sample type ARTERIAL    CT Angio Head W or Wo Contrast  Result Date: 07/10/2021 CLINICAL DATA:  65 year old male. Persistent headache and confusion. Acute stroke suspected. EXAM: CT HEAD WITHOUT CONTRAST CTA HEAD AND NECK. TECHNIQUE: Contiguous axial images were obtained from the  base of the skull through the vertex without intravenous contrast. Multi detector CTA of the head and neck during bolus administration of IV contrast. Multiplanar reformatted images, thick MIP images provided. COMPARISON:  Brain MRI 07/09/2021 at 2141 hours.  Head CT yesterday. FINDINGS: Brain: Mild motion artifact now. Stable non contrast CT appearance of the brain. No midline shift, ventriculomegaly, mass effect, evidence of mass lesion, intracranial hemorrhage or evidence of cortically based acute infarction. Vascular: No suspicious intracranial vascular hyperdensity. Skull: No acute osseous abnormality identified. Sinuses/Orbits: Visualized paranasal sinuses and mastoids are stable and well aerated. Other: Visualized orbits and  scalp soft tissues are within normal limits. ASPECTS [facility] Stroke Program Early CT Score) Total score (0-10 with 10 being normal): 10 CTA NECK Skeleton: Absent and carious dentition. Mild for age cervical spine degeneration. No acute osseous abnormality identified. Upper chest: Atelectasis. Other neck: Negative. Aortic arch: 3 vessel arch configuration.  No arch atherosclerosis. Right carotid system: Negative. Left carotid system: Negative. Vertebral arteries: Normal proximal right subclavian artery and right vertebral artery origin. Right vertebral artery appears patent and normal to the skull base. Normal proximal left subclavian artery and left vertebral artery origin. Left vertebral artery appears mildly dominant, mildly tortuous, patent and otherwise normal to the skull base. CTA HEAD Posterior circulation: Slightly dominant left vertebral artery V4 segment. Normal vertebral arteries to the vertebrobasilar junction. Normal PICA origins. Patent basilar artery without stenosis. Patent SCA and PCA origins. Both posterior communicating arteries are present. Bilateral PCA branches are within normal limits. Anterior circulation: Both ICA siphons are patent. No siphon plaque or stenosis. Normal ophthalmic and posterior communicating artery origins. Patent carotid termini, MCA and ACA origins. Left ACA A1 appears mildly dominant. Anterior communicating artery and bilateral ACA branches are within normal limits. Left MCA M1 segment and bifurcation are patent without stenosis. Left MCA branches are within normal limits. Right MCA M1 segment and trifurcation are within normal limits. Right MCA branches are within normal limits. Venous sinuses: Patent. Anatomic variants: Mildly dominant left vertebral artery, left ACA A1 segment. Review of the MIP images confirms the above findings IMPRESSION: 1. Negative CTA Head and Neck. No large vessel occlusion, no atherosclerosis or stenosis identified. 2. Stable and negative for  age noncontrast CT appearance of the brain. 3. Poor dentition. Electronically Signed   By: Genevie Ann M.D.   On: 07/10/2021 04:44   MR BRAIN WO CONTRAST  Result Date: 07/09/2021 CLINICAL DATA:  Neuro deficit, acute stroke suspected. EXAM: MRI HEAD WITHOUT CONTRAST TECHNIQUE: Multiplanar, multiecho pulse sequences of the brain and surrounding structures were obtained without intravenous contrast. COMPARISON:  Same day head CT. FINDINGS: Motion limited evaluation.  Within limitation: Brain: No acute infarction, hemorrhage, hydrocephalus, extra-axial collection or mass lesion. Mild scattered T2 hyperintensities in the white matter, nonspecific but most likely related to mild chronic microvascular ischemic disease. Vascular: Major arterial flow voids are maintained at the skull base. Skull and upper cervical spine: Normal marrow signal. Sinuses/Orbits: Small left maxillary sinus retention cyst. Trace ethmoid air cell and frontal sinus mucosal thickening. No acute orbital findings. Other: No sizable mastoid effusions. IMPRESSION: 1. Motion limited exam without evidence of acute intracranial abnormality. Specifically, no acute infarct. 2. Mild chronic microvascular ischemic disease. Electronically Signed   By: Margaretha Sheffield MD   On: 07/09/2021 20:13   DG Chest Portable 1 View  Result Date: 07/10/2021 CLINICAL DATA:  65 year old male with altered mental status. EXAM: PORTABLE CHEST 1 VIEW COMPARISON:  Chest radiograph 05/24/2011. FINDINGS: Portable  AP supine view at 0351 hours. Mildly rotated to the left. Lung volumes and mediastinal contours are stable and within normal limits. Visualized tracheal air column is within normal limits. Allowing for portable technique the lungs are clear. No pneumothorax or pleural effusion. No acute osseous abnormality identified. IMPRESSION: Negative portable chest. Electronically Signed   By: Genevie Ann M.D.   On: 07/10/2021 04:45   CT HEAD CODE STROKE WO CONTRAST  Result Date:  07/09/2021 CLINICAL DATA:  Code stroke. Neuro deficit, acute, stroke suspected. Additional history provided: Headache and slurred speech. EXAM: CT HEAD WITHOUT CONTRAST TECHNIQUE: Contiguous axial images were obtained from the base of the skull through the vertex without intravenous contrast. COMPARISON:  Head CT 12/11/2007.  Brain MRI 03/08/2004. FINDINGS: Brain: The examination is significantly motion degraded at the level of the maxillofacial structures and skull base. Cerebral volume is normal for age. There is no acute intracranial hemorrhage. No demarcated cortical infarct. No extra-axial fluid collection. No evidence of an intracranial mass. No midline shift. Vascular: No hyperdense vessel. Skull: No calvarial fracture or focal suspicious calvarial lesion. Sinuses/Orbits: Visualized orbits show no acute finding. Left maxillary sinus mucous retention cyst. Trace mucosal thickening within the bilateral ethmoid air cells. ASPECTS [facility] Stroke Program Early CT Score) - Ganglionic level infarction (caudate, lentiform nuclei, internal capsule, insula, M1-M3 cortex): 7 - Supraganglionic infarction (M4-M6 cortex): 3 Total score (0-10 with 10 being normal): 10 These results were communicated to Dr. Rory Percy at 7:40 pmon 7/29/2022by text page via the [facility] messaging system. IMPRESSION: Significantly motion degraded examination at the level of the maxillofacial structures and skull base. No evidence of acute intracranial abnormality Paranasal sinus disease, as described. Electronically Signed   By: Kellie Simmering DO   On: 07/09/2021 19:40    EKG None  Radiology CT Angio Head W or Wo Contrast  Result Date: 07/10/2021 CLINICAL DATA:  65 year old male. Persistent headache and confusion. Acute stroke suspected. EXAM: CT HEAD WITHOUT CONTRAST CTA HEAD AND NECK. TECHNIQUE: Contiguous axial images were obtained from the base of the skull through the vertex without intravenous contrast. Multi detector CTA of the head and  neck during bolus administration of IV contrast. Multiplanar reformatted images, thick MIP images provided. COMPARISON:  Brain MRI 07/09/2021 at 2141 hours.  Head CT yesterday. FINDINGS: Brain: Mild motion artifact now. Stable non contrast CT appearance of the brain. No midline shift, ventriculomegaly, mass effect, evidence of mass lesion, intracranial hemorrhage or evidence of cortically based acute infarction. Vascular: No suspicious intracranial vascular hyperdensity. Skull: No acute osseous abnormality identified. Sinuses/Orbits: Visualized paranasal sinuses and mastoids are stable and well aerated. Other: Visualized orbits and scalp soft tissues are within normal limits. ASPECTS [facility] Stroke Program Early CT Score) Total score (0-10 with 10 being normal): 10 CTA NECK Skeleton: Absent and carious dentition. Mild for age cervical spine degeneration. No acute osseous abnormality identified. Upper chest: Atelectasis. Other neck: Negative. Aortic arch: 3 vessel arch configuration.  No arch atherosclerosis. Right carotid system: Negative. Left carotid system: Negative. Vertebral arteries: Normal proximal right subclavian artery and right vertebral artery origin. Right vertebral artery appears patent and normal to the skull base. Normal proximal left subclavian artery and left vertebral artery origin. Left vertebral artery appears mildly dominant, mildly tortuous, patent and otherwise normal to the skull base. CTA HEAD Posterior circulation: Slightly dominant left vertebral artery V4 segment. Normal vertebral arteries to the vertebrobasilar junction. Normal PICA origins. Patent basilar artery without stenosis. Patent SCA and PCA origins. Both posterior communicating  arteries are present. Bilateral PCA branches are within normal limits. Anterior circulation: Both ICA siphons are patent. No siphon plaque or stenosis. Normal ophthalmic and posterior communicating artery origins. Patent carotid termini, MCA and ACA  origins. Left ACA A1 appears mildly dominant. Anterior communicating artery and bilateral ACA branches are within normal limits. Left MCA M1 segment and bifurcation are patent without stenosis. Left MCA branches are within normal limits. Right MCA M1 segment and trifurcation are within normal limits. Right MCA branches are within normal limits. Venous sinuses: Patent. Anatomic variants: Mildly dominant left vertebral artery, left ACA A1 segment. Review of the MIP images confirms the above findings IMPRESSION: 1. Negative CTA Head and Neck. No large vessel occlusion, no atherosclerosis or stenosis identified. 2. Stable and negative for age noncontrast CT appearance of the brain. 3. Poor dentition. Electronically Signed   By: Genevie Ann M.D.   On: 07/10/2021 04:44   MR BRAIN WO CONTRAST  Result Date: 07/09/2021 CLINICAL DATA:  Neuro deficit, acute stroke suspected. EXAM: MRI HEAD WITHOUT CONTRAST TECHNIQUE: Multiplanar, multiecho pulse sequences of the brain and surrounding structures were obtained without intravenous contrast. COMPARISON:  Same day head CT. FINDINGS: Motion limited evaluation.  Within limitation: Brain: No acute infarction, hemorrhage, hydrocephalus, extra-axial collection or mass lesion. Mild scattered T2 hyperintensities in the white matter, nonspecific but most likely related to mild chronic microvascular ischemic disease. Vascular: Major arterial flow voids are maintained at the skull base. Skull and upper cervical spine: Normal marrow signal. Sinuses/Orbits: Small left maxillary sinus retention cyst. Trace ethmoid air cell and frontal sinus mucosal thickening. No acute orbital findings. Other: No sizable mastoid effusions. IMPRESSION: 1. Motion limited exam without evidence of acute intracranial abnormality. Specifically, no acute infarct. 2. Mild chronic microvascular ischemic disease. Electronically Signed   By: Margaretha Sheffield MD   On: 07/09/2021 20:13   DG Chest Portable 1  View  Result Date: 07/10/2021 CLINICAL DATA:  65 year old male with altered mental status. EXAM: PORTABLE CHEST 1 VIEW COMPARISON:  Chest radiograph 05/24/2011. FINDINGS: Portable AP supine view at 0351 hours. Mildly rotated to the left. Lung volumes and mediastinal contours are stable and within normal limits. Visualized tracheal air column is within normal limits. Allowing for portable technique the lungs are clear. No pneumothorax or pleural effusion. No acute osseous abnormality identified. IMPRESSION: Negative portable chest. Electronically Signed   By: Genevie Ann M.D.   On: 07/10/2021 04:45   CT HEAD CODE STROKE WO CONTRAST  Result Date: 07/09/2021 CLINICAL DATA:  Code stroke. Neuro deficit, acute, stroke suspected. Additional history provided: Headache and slurred speech. EXAM: CT HEAD WITHOUT CONTRAST TECHNIQUE: Contiguous axial images were obtained from the base of the skull through the vertex without intravenous contrast. COMPARISON:  Head CT 12/11/2007.  Brain MRI 03/08/2004. FINDINGS: Brain: The examination is significantly motion degraded at the level of the maxillofacial structures and skull base. Cerebral volume is normal for age. There is no acute intracranial hemorrhage. No demarcated cortical infarct. No extra-axial fluid collection. No evidence of an intracranial mass. No midline shift. Vascular: No hyperdense vessel. Skull: No calvarial fracture or focal suspicious calvarial lesion. Sinuses/Orbits: Visualized orbits show no acute finding. Left maxillary sinus mucous retention cyst. Trace mucosal thickening within the bilateral ethmoid air cells. ASPECTS [facility] Stroke Program Early CT Score) - Ganglionic level infarction (caudate, lentiform nuclei, internal capsule, insula, M1-M3 cortex): 7 - Supraganglionic infarction (M4-M6 cortex): 3 Total score (0-10 with 10 being normal): 10 These results were communicated to Dr. Rory Percy  at 7:40 pmon 7/29/2022by text page via the [facility] messaging system.  IMPRESSION: Significantly motion degraded examination at the level of the maxillofacial structures and skull base. No evidence of acute intracranial abnormality Paranasal sinus disease, as described. Electronically Signed   By: Kellie Simmering DO   On: 07/09/2021 19:40    Procedures Procedures   Medications Ordered in ED Medications  vancomycin (VANCOREADY) IVPB 2000 mg/400 mL (has no administration in time range)  cefTRIAXone (ROCEPHIN) 2 g in sodium chloride 0.9 % 100 mL IVPB (has no administration in time range)  ampicillin (OMNIPEN) 2 g in sodium chloride 0.9 % 100 mL IVPB (has no administration in time range)  iohexol (OMNIPAQUE) 350 MG/ML injection 75 mL (75 mLs Intravenous Contrast Given 07/10/21 0431)    ED Course  I have reviewed the triage vital signs and the nursing notes.  Pertinent labs & imaging results that were available during my care of the patient were reviewed by me and considered in my medical decision making (see chart for details).   Case d/w Dr. Rory Percy. Cover for meningitis.  Will need fluoro guided LP. This has been ordered    I am also concerned for salicylate toxicity as patient is altered with low grade temperature having taken lots of BC powders.  Tylenol and salicylate levels drawn  Jason [name] was evaluated in Emergency Department on 07/10/2021 for the symptoms described in the history of present illness. He was evaluated in the context of the global COVID-19 pandemic, which necessitated consideration that the patient might be at risk for infection with the SARS-CoV-2 virus that causes COVID-19. Institutional protocols and algorithms that pertain to the evaluation of patients at risk for COVID-19 are in a state of rapid change based on information released by regulatory bodies including the CDC and federal and state organizations. These policies and algorithms were followed during the patient's care in the ED.    Final Clinical Impression(s) / ED  Diagnoses Final diagnoses:  Altered mental status, unspecified altered mental status type   Admit to medicine  Rx / DC Orders ED Discharge Orders     None        Estelle Greenleaf, MD 07/10/21 [phone]

## 2021-07-10 NOTE — ED Triage Notes (Signed)
Pt brought back by significant other concerning continuation of headache and confusion. Was discharged from this ED today.

## 2021-07-10 NOTE — ED Notes (Signed)
Report given to Zoe H, RN  

## 2021-07-10 NOTE — Progress Notes (Signed)
PROGRESS NOTE                                                                                                                                                                                                             Patient Demographics:    Jason [name], is a 65 y.o. male, DOB - [birth year], [id number]  Outpatient Primary MD for the patient is [name], NP    LOS - 0  Admit date - 07/10/2021    Chief Complaint  Patient presents with   Altered Mental Status   Headache       Brief Narrative (HPI from H&P)  - Jason [name] is a 65 y.o. male with medical history significant of DM and HTN presenting with AMS after prior ER visit earlier in the day for the same.  He has been complaining of an intermittent headache for about a week, he was at times acting confused with mumbling speech.  He was brought to the ER where he was seen by neurology, bedside LP attempt failed.  He was placed on empiric coverage for meningitis and admitted for further work-up including fluoroscopy guided LP.   Subjective:    Jason [name] today has, No headache, No chest pain, No abdominal pain - No Nausea, No new weakness tingling or numbness, no SOB.   Assessment  & Plan :     AMS with Intermittent Headaches - per Neuro proceed with Fluoro guided LP, bedside failed, empiric Meningitis coverage per Neuro, current stable, headache resolved, No fever - leukocytosis - stable CRP and Procal, B12 was low and THC ++. Monitor for now.  2. THC use - counseled to quit  3. Low B12, replace SQ here.  4. Dyslipidemia.  On statin.    5. Obesity: BMI 30, follow with PCP  6. DM type II.  Sliding scale for now.  Lab Results  Component Value Date   HGBA1C 8.3 (H) 06/17/2021   CBG (last 3)  Recent Labs    07/09/21 1921  GLUCAP 114*      Recent Labs  Lab 07/09/21 1922 07/09/21 1926 07/09/21 2009 07/10/21 0227 07/10/21 0349  07/10/21 0433 07/10/21 0622  WBC 8.9  --   --   --  9.9  --   --   HGB 15.3 16.0  --   --  14.7  14.3  --   HCT 46.5 47.0  --   --  44.3 42.0  --   PLT 192  --   --   --  189  --   --   CRP  --   --   --   --  <0.5  --   --   BNP  --   --   --   --   --   --  21.1  PROCALCITON  --   --   --   --  <0.10  --   --   AST 29  --   --   --   --   --   --   ALT 30  --   --   --   --   --   --   ALKPHOS 62  --   --   --   --   --   --   BILITOT 0.6  --   --   --   --   --   --   ALBUMIN 4.0  --   --   --   --   --   --   INR 1.1  --   --   --   --   --   --   SARSCOV2NAA  --   --  NEGATIVE NEGATIVE  --   --   --               Condition -  Guarded  Family Communication  :  None present  Code Status :  Full  Consults  :  Neuro  PUD Prophylaxis :    Procedures  :     CT Head, CTA Head and Neck - Non acute  MRI Head - non acute      Disposition Plan  :    Status is: Inpatient  Remains inpatient appropriate because:IV treatments appropriate due to intensity of illness or inability to take PO  Dispo:  Patient From: Home  Planned Disposition: Home  Medically stable for discharge: No   DVT Prophylaxis  :    SCDs Start: 07/10/21 0458    Lab Results  Component Value Date   PLT 189 07/10/2021    Diet :  Diet Order             Diet Carb Modified Fluid consistency: Thin; Room service appropriate? Yes  Diet effective ____                    Inpatient Medications  Scheduled Meds:  [START ON 07/11/2021] diltiazem  120 mg Oral Daily   insulin aspart  0-15 Units Subcutaneous TID WC   insulin aspart  0-5 Units Subcutaneous QHS   rosuvastatin  5 mg Oral QHS   vitamin B-12  2,000 mcg Oral Daily   Continuous Infusions:  sodium chloride     acyclovir Stopped (07/10/21 0854)   ampicillin (OMNIPEN) IV Stopped (07/10/21 0750)   cefTRIAXone (ROCEPHIN)  IV Stopped (07/10/21 0725)   vancomycin     vancomycin 2,000 mg (07/10/21 0857)   PRN Meds:.  Antibiotics   :    Anti-infectives (From admission, onward)    Start     Dose/Rate Route Frequency Ordered Stop   07/10/21 2200  vancomycin (VANCOREADY) IVPB 1250 mg/250 mL        1,250 mg 166.7 mL/hr over 90 Minutes Intravenous Every 12 hours 07/10/21 0501     07/10/21  0700  ampicillin (OMNIPEN) 2 g in sodium chloride 0.9 % 100 mL IVPB        2 g 300 mL/hr over 20 Minutes Intravenous Every 4 hours 07/10/21 0500     07/10/21 0600  acyclovir (ZOVIRAX) 1,000 mg in dextrose 5 % 150 mL IVPB        1,000 mg 170 mL/hr over 60 Minutes Intravenous Every 8 hours 07/10/21 0504     07/10/21 0515  vancomycin (VANCOREADY) IVPB 2000 mg/400 mL        2,000 mg 200 mL/hr over 120 Minutes Intravenous  Once 07/10/21 0500     07/10/21 0515  cefTRIAXone (ROCEPHIN) 2 g in sodium chloride 0.9 % 100 mL IVPB        2 g 200 mL/hr over 30 Minutes Intravenous Every 12 hours 07/10/21 0500          Time Spent in minutes  30   Lala Lund M.D on 07/10/2021 at 10:29 AM  To page go to [email or web address]   [facility] -  Office  [phone]    See all Orders from today for further details    Objective:   Vitals:   07/10/21 0930 07/10/21 0945 07/10/21 1000 07/10/21 1015  BP: 126/67 (!) 117/53 126/81   Pulse: 99 67 61 62  Resp:  '17 20 17  '$ Temp:      TempSrc:      SpO2: 95% 100% 99% 100%  Weight:        Wt Readings from Last 3 Encounters:  07/10/21 109.3 kg  06/17/21 109.3 kg  03/04/21 114 kg     Intake/Output Summary (Last 24 hours) at 07/10/2021 1029 Last data filed at 07/10/2021 0710 Gross per 24 hour  Intake --  Output 400 ml  Net -400 ml     Physical Exam  Awake Alert, No new F.N deficits, Supple neck [place].AT,PERRAL Supple Neck,No JVD, No cervical lymphadenopathy appriciated.  Symmetrical Chest wall movement, Good air movement bilaterally, CTAB RRR,No Gallops,Rubs or new Murmurs, No Parasternal Heave +ve B.Sounds, Abd Soft, No tenderness, No organomegaly appriciated, No rebound - guarding  or rigidity. No Cyanosis, Clubbing or edema, No new Rash or bruise          Data Review:    CBC Recent Labs  Lab 07/09/21 1922 07/09/21 1926 07/10/21 0349 07/10/21 0433  WBC 8.9  --  9.9  --   HGB 15.3 16.0 14.7 14.3  HCT 46.5 47.0 44.3 42.0  PLT 192  --  189  --   MCV 97.9  --  97.6  --   MCH 32.2  --  32.4  --   MCHC 32.9  --  33.2  --   RDW 13.7  --  13.9  --   LYMPHSABS 5.3*  --  1.2  --   MONOABS 1.0  --  0.3  --   EOSABS 0.4  --  0.0  --   BASOSABS 0.2*  --  0.0  --     Recent Labs  Lab 07/09/21 1922 07/09/21 1926 07/10/21 0349 07/10/21 0412 07/10/21 0433 07/10/21 0622  NA 139 141  --   --  138  --   K 4.1 4.0  --   --  5.1  --   CL 104 107  --   --   --   --   CO2 23  --   --   --   --   --   GLUCOSE 115* 113*  --   --   --   --  BUN 13 15  --   --   --   --   CREATININE 1.19 1.10  --   --   --   --   CALCIUM 9.8  --   --   --   --   --   AST 29  --   --   --   --   --   ALT 30  --   --   --   --   --   ALKPHOS 62  --   --   --   --   --   BILITOT 0.6  --   --   --   --   --   ALBUMIN 4.0  --   --   --   --   --   MG  --   --  2.1  --   --   --   CRP  --   --  <0.5  --   --   --   PROCALCITON  --   --  <0.10  --   --   --   INR 1.1  --   --   --   --   --   TSH  --   --  2.119  --   --   --   AMMONIA  --   --   --  22  --   --   BNP  --   --   --   --   --  21.1    ------------------------------------------------------------------------------------------------------------------ Recent Labs    07/10/21 0349  CHOL 150  HDL 61  LDLCALC 79  TRIG 49  CHOLHDL 2.5    Lab Results  Component Value Date   HGBA1C 8.3 (H) 06/17/2021   ------------------------------------------------------------------------------------------------------------------ Recent Labs    07/10/21 0349  TSH 2.119    Cardiac Enzymes No results for input(s): CKMB, TROPONINI, MYOGLOBIN in the last 168 hours.  Invalid input(s):  CK ------------------------------------------------------------------------------------------------------------------    Component Value Date/Time   BNP 21.1 07/10/2021 0622    Micro Results Recent Results (from the past 240 hour(s))  Resp Panel by RT-PCR (Flu A&B, Covid) Nasopharyngeal Swab     Status: None   Collection Time: 07/09/21  8:09 PM   Specimen: Nasopharyngeal Swab; Nasopharyngeal(NP) swabs in vial transport medium  Result Value Ref Range Status   SARS Coronavirus 2 by RT PCR NEGATIVE NEGATIVE Final    Comment: (NOTE) SARS-CoV-2 target nucleic acids are NOT DETECTED.  The SARS-CoV-2 RNA is generally detectable in upper respiratory specimens during the acute phase of infection. The lowest concentration of SARS-CoV-2 viral copies this assay can detect is 138 copies/mL. A negative result does not preclude SARS-Cov-2 infection and should not be used as the sole basis for treatment or other patient management decisions. A negative result may occur with  improper specimen collection/handling, submission of specimen other than nasopharyngeal swab, presence of viral mutation(s) within the areas targeted by this assay, and inadequate number of viral copies(<138 copies/mL). A negative result must be combined with clinical observations, patient history, and epidemiological information. The expected result is Negative.  Fact Sheet for Patients:  EntrepreneurPulse.com.au  Fact Sheet for Healthcare Providers:  IncredibleEmployment.be  This test is no t yet approved or cleared by the [place] FDA and  has been authorized for detection and/or diagnosis of SARS-CoV-2 by FDA under an Emergency Use Authorization (EUA). This EUA will remain  in effect (meaning this test can be  used) for the duration of the COVID-19 declaration under Section 564(b)(1) of the Act, 21 U.S.C.section 360bbb-3(b)(1), unless the authorization is terminated  or revoked  sooner.       Influenza A by PCR NEGATIVE NEGATIVE Final   Influenza B by PCR NEGATIVE NEGATIVE Final    Comment: (NOTE) The Xpert Xpress SARS-CoV-2/FLU/RSV plus assay is intended as an aid in the diagnosis of influenza from Nasopharyngeal swab specimens and should not be used as a sole basis for treatment. Nasal washings and aspirates are unacceptable for Xpert Xpress SARS-CoV-2/FLU/RSV testing.  Fact Sheet for Patients: EntrepreneurPulse.com.au  Fact Sheet for Healthcare Providers: IncredibleEmployment.be  This test is not yet approved or cleared by the [place] FDA and has been authorized for detection and/or diagnosis of SARS-CoV-2 by FDA under an Emergency Use Authorization (EUA). This EUA will remain in effect (meaning this test can be used) for the duration of the COVID-19 declaration under Section 564(b)(1) of the Act, 21 U.S.C. section 360bbb-3(b)(1), unless the authorization is terminated or revoked.  Performed at [facility], [place] [address] [place], [place] [address]   Resp Panel by RT-PCR (Flu A&B, Covid) Nasopharyngeal Swab     Status: None   Collection Time: 07/10/21  2:27 AM   Specimen: Nasopharyngeal Swab; Nasopharyngeal(NP) swabs in vial transport medium  Result Value Ref Range Status   SARS Coronavirus 2 by RT PCR NEGATIVE NEGATIVE Final    Comment: (NOTE) SARS-CoV-2 target nucleic acids are NOT DETECTED.  The SARS-CoV-2 RNA is generally detectable in upper respiratory specimens during the acute phase of infection. The lowest concentration of SARS-CoV-2 viral copies this assay can detect is 138 copies/mL. A negative result does not preclude SARS-Cov-2 infection and should not be used as the sole basis for treatment or other patient management decisions. A negative result may occur with  improper specimen collection/handling, submission of specimen other than nasopharyngeal swab, presence of viral  mutation(s) within the areas targeted by this assay, and inadequate number of viral copies(<138 copies/mL). A negative result must be combined with clinical observations, patient history, and epidemiological information. The expected result is Negative.  Fact Sheet for Patients:  EntrepreneurPulse.com.au  Fact Sheet for Healthcare Providers:  IncredibleEmployment.be  This test is no t yet approved or cleared by the [place] FDA and  has been authorized for detection and/or diagnosis of SARS-CoV-2 by FDA under an Emergency Use Authorization (EUA). This EUA will remain  in effect (meaning this test can be used) for the duration of the COVID-19 declaration under Section 564(b)(1) of the Act, 21 U.S.C.section 360bbb-3(b)(1), unless the authorization is terminated  or revoked sooner.       Influenza A by PCR NEGATIVE NEGATIVE Final   Influenza B by PCR NEGATIVE NEGATIVE Final    Comment: (NOTE) The Xpert Xpress SARS-CoV-2/FLU/RSV plus assay is intended as an aid in the diagnosis of influenza from Nasopharyngeal swab specimens and should not be used as a sole basis for treatment. Nasal washings and aspirates are unacceptable for Xpert Xpress SARS-CoV-2/FLU/RSV testing.  Fact Sheet for Patients: EntrepreneurPulse.com.au  Fact Sheet for Healthcare Providers: IncredibleEmployment.be  This test is not yet approved or cleared by the [place] FDA and has been authorized for detection and/or diagnosis of SARS-CoV-2 by FDA under an Emergency Use Authorization (EUA). This EUA will remain in effect (meaning this test can be used) for the duration of the COVID-19 declaration under Section 564(b)(1) of the Act, 21 U.S.C. section 360bbb-3(b)(1), unless the authorization is  terminated or revoked.  Performed at [facility], [place] [address], [place], [place] [address]     Radiology Reports CT Angio Head  W or [place] Contrast  Result Date: 07/10/2021 CLINICAL DATA:  65 year old male. Persistent headache and confusion. Acute stroke suspected. EXAM: CT HEAD WITHOUT CONTRAST CTA HEAD AND NECK. TECHNIQUE: Contiguous axial images were obtained from the base of the skull through the vertex without intravenous contrast. Multi detector CTA of the head and neck during bolus administration of IV contrast. Multiplanar reformatted images, thick MIP images provided. COMPARISON:  Brain MRI 07/09/2021 at 2141 hours.  Head CT yesterday. FINDINGS: Brain: Mild motion artifact now. Stable non contrast CT appearance of the brain. No midline shift, ventriculomegaly, mass effect, evidence of mass lesion, intracranial hemorrhage or evidence of cortically based acute infarction. Vascular: No suspicious intracranial vascular hyperdensity. Skull: No acute osseous abnormality identified. Sinuses/Orbits: Visualized paranasal sinuses and mastoids are stable and well aerated. Other: Visualized orbits and scalp soft tissues are within normal limits. ASPECTS [facility] Stroke Program Early CT Score) Total score (0-10 with 10 being normal): 10 CTA NECK Skeleton: Absent and carious dentition. Mild for age cervical spine degeneration. No acute osseous abnormality identified. Upper chest: Atelectasis. Other neck: Negative. Aortic arch: 3 vessel arch configuration.  No arch atherosclerosis. Right carotid system: Negative. Left carotid system: Negative. Vertebral arteries: Normal proximal right subclavian artery and right vertebral artery origin. Right vertebral artery appears patent and normal to the skull base. Normal proximal left subclavian artery and left vertebral artery origin. Left vertebral artery appears mildly dominant, mildly tortuous, patent and otherwise normal to the skull base. CTA HEAD Posterior circulation: Slightly dominant left vertebral artery V4 segment. Normal vertebral arteries to the vertebrobasilar junction. Normal PICA origins.  Patent basilar artery without stenosis. Patent SCA and PCA origins. Both posterior communicating arteries are present. Bilateral PCA branches are within normal limits. Anterior circulation: Both ICA siphons are patent. No siphon plaque or stenosis. Normal ophthalmic and posterior communicating artery origins. Patent carotid termini, MCA and ACA origins. Left ACA A1 appears mildly dominant. Anterior communicating artery and bilateral ACA branches are within normal limits. Left MCA M1 segment and bifurcation are patent without stenosis. Left MCA branches are within normal limits. Right MCA M1 segment and trifurcation are within normal limits. Right MCA branches are within normal limits. Venous sinuses: Patent. Anatomic variants: Mildly dominant left vertebral artery, left ACA A1 segment. Review of the MIP images confirms the above findings IMPRESSION: 1. Negative CTA Head and Neck. No large vessel occlusion, no atherosclerosis or stenosis identified. 2. Stable and negative for age noncontrast CT appearance of the brain. 3. Poor dentition. Electronically Signed   By: Genevie Ann M.D.   On: 07/10/2021 04:44   MR BRAIN WO CONTRAST  Result Date: 07/09/2021 CLINICAL DATA:  Neuro deficit, acute stroke suspected. EXAM: MRI HEAD WITHOUT CONTRAST TECHNIQUE: Multiplanar, multiecho pulse sequences of the brain and surrounding structures were obtained without intravenous contrast. COMPARISON:  Same day head CT. FINDINGS: Motion limited evaluation.  Within limitation: Brain: No acute infarction, hemorrhage, hydrocephalus, extra-axial collection or mass lesion. Mild scattered T2 hyperintensities in the white matter, nonspecific but most likely related to mild chronic microvascular ischemic disease. Vascular: Major arterial flow voids are maintained at the skull base. Skull and upper cervical spine: Normal marrow signal. Sinuses/Orbits: Small left maxillary sinus retention cyst. Trace ethmoid air cell and frontal sinus mucosal  thickening. No acute orbital findings. Other: No sizable mastoid effusions. IMPRESSION: 1. Motion  limited exam without evidence of acute intracranial abnormality. Specifically, no acute infarct. 2. Mild chronic microvascular ischemic disease. Electronically Signed   By: Margaretha Sheffield MD   On: 07/09/2021 20:13   DG Chest Portable 1 View  Result Date: 07/10/2021 CLINICAL DATA:  65 year old male with altered mental status. EXAM: PORTABLE CHEST 1 VIEW COMPARISON:  Chest radiograph 05/24/2011. FINDINGS: Portable AP supine view at 0351 hours. Mildly rotated to the left. Lung volumes and mediastinal contours are stable and within normal limits. Visualized tracheal air column is within normal limits. Allowing for portable technique the lungs are clear. No pneumothorax or pleural effusion. No acute osseous abnormality identified. IMPRESSION: Negative portable chest. Electronically Signed   By: Genevie Ann M.D.   On: 07/10/2021 04:45   CT HEAD CODE STROKE WO CONTRAST  Result Date: 07/09/2021 CLINICAL DATA:  Code stroke. Neuro deficit, acute, stroke suspected. Additional history provided: Headache and slurred speech. EXAM: CT HEAD WITHOUT CONTRAST TECHNIQUE: Contiguous axial images were obtained from the base of the skull through the vertex without intravenous contrast. COMPARISON:  Head CT 12/11/2007.  Brain MRI 03/08/2004. FINDINGS: Brain: The examination is significantly motion degraded at the level of the maxillofacial structures and skull base. Cerebral volume is normal for age. There is no acute intracranial hemorrhage. No demarcated cortical infarct. No extra-axial fluid collection. No evidence of an intracranial mass. No midline shift. Vascular: No hyperdense vessel. Skull: No calvarial fracture or focal suspicious calvarial lesion. Sinuses/Orbits: Visualized orbits show no acute finding. Left maxillary sinus mucous retention cyst. Trace mucosal thickening within the bilateral ethmoid air cells. ASPECTS  [facility] Stroke Program Early CT Score) - Ganglionic level infarction (caudate, lentiform nuclei, internal capsule, insula, M1-M3 cortex): 7 - Supraganglionic infarction (M4-M6 cortex): 3 Total score (0-10 with 10 being normal): 10 These results were communicated to Dr. Rory Percy at 7:40 pmon 7/29/2022by text page via the [facility] messaging system. IMPRESSION: Significantly motion degraded examination at the level of the maxillofacial structures and skull base. No evidence of acute intracranial abnormality Paranasal sinus disease, as described. Electronically Signed   By: Kellie Simmering DO   On: 07/09/2021 19:40

## 2021-07-10 NOTE — ED Notes (Signed)
Pt alert to self, place, dob, and situation.

## 2021-07-10 NOTE — Progress Notes (Signed)
EEG complete - results pending 

## 2021-07-10 NOTE — Progress Notes (Addendum)
Neurology Progress Note  Consult completed few hours ago, patient discharged and came back. Please see the consultation note from 07/09/2021 under the separate admission.  S:// Patient seen a few hours ago as an acute code stroke, with nonfocal findings and negative imaging-see my consultation note from 07/09/2021 in the chart. He was brought back by family because he was very somnolent, difficult to even get out of the car and be taken to his bed. He appears very drowsy and it is difficult for his wife to take care of him hence she brought him back. She denies again any preceding illnesses or sicknesses.  He has not been febrile. He had a temperature of up to 100 Fahrenheit but nothing over that. No sick exposures.  He has a history of Bell's palsy.  Wife also reported that she noticed some right-sided facial drooping worsening which at this point I was not able to see.  O:// Current vital signs: BP 135/64   Pulse 74   Temp 100 F (37.8 C) (Oral)   Resp 12   SpO2 99%  Vital signs in last 24 hours: Temp:  [97.6 F (36.4 C)-100 F (37.8 C)] 100 F (37.8 C) (07/30 0134) Pulse Rate:  [32-96] 74 (07/30 0330) Resp:  [12-21] 12 (07/30 0330) BP: (135-165)/(62-104) 135/64 (07/30 0330) SpO2:  [86 %-100 %] 99 % (07/30 0330) General: Drowsy, opens eyes to voice and follows commands HEENT: Normocephalic/atraumatic, neck is supple CVs: Regular rhythm Abdomen nondistended nontender Respiratory: Chest clear Extremities warm well perfused Neurological exam He is drowsy, opens eyes to voice, follows commands. Is able to tell me his name, correct age, the current month. But keeps falling asleep during the whole encounter. He is laying in bed with a sheet over him and no clothes on. He is able to name objects but requires to wake him up multiple times. Cranial nerves: Pupils equal round react light, extraocular movements intact, visual fields full, face appears symmetric both at rest and on  smiling, tongue and palate midline. Motor exam: He has no vertical drift in spite of his reduced attention concentration in any of the 4 extremities. Sensation intact to touch Coordination: Difficult to assess given his mentation NIH stroke scale 1a Level of Conscious.: 1 1b LOC Questions: 0 1c LOC Commands: 0 2 Best Gaze: 0 3 Visual: 0 4 Facial Palsy: 0 5a Motor Arm - left: 0 5b Motor Arm - Right: 0 6a Motor Leg - Left: 0 6b Motor Leg - Right: 0 7 Limb Ataxia:  8 Sensory: 0 9 Best Language:  10 Dysarthria: 0 11 Extinct. and Inatten.: 0 TOTAL: 1    Medications No current facility-administered medications for this encounter.  Current Outpatient Medications:    Blood Glucose Monitoring Suppl (ONE TOUCH ULTRA 2) w/Device KIT, Use the device daily to check your blood sugars 1-4 times as instructed., Disp: 1 kit, Rfl: 0   butalbital-acetaminophen-caffeine (FIORICET) 50-325-40 MG tablet, Take 1 tablet by mouth every 6 (six) hours as needed for headache., Disp: 10 tablet, Rfl: 0   diltiazem (CARDIZEM CD) 120 MG 24 hr capsule, TAKE 1 CAPSULE BY MOUTH EVERY DAY, Disp: 90 capsule, Rfl: 3   glipiZIDE (GLUCOTROL XL) 10 MG 24 hr tablet, Take 1 tablet (10 mg total) by mouth daily with breakfast., Disp: 90 tablet, Rfl: 0   glucose blood (ONE TOUCH ULTRA TEST) test strip, USE TO TEST BLOOD GLUCOSE TWICE DAILY, Disp: 200 each, Rfl: 3   JARDIANCE 25 MG TABS tablet, TAKE 1  TABLET (25 MG TOTAL) BY MOUTH DAILY., Disp: 90 tablet, Rfl: 0   Lancets (ONETOUCH ULTRASOFT) lancets, Use to check blood sugar BID, Disp: 100 each, Rfl: 12   metFORMIN (GLUCOPHAGE XR) 500 MG 24 hr tablet, Take 2 tablets (1,000 mg total) by mouth in the morning and at bedtime., Disp: 360 tablet, Rfl: 0   rosuvastatin (CRESTOR) 5 MG tablet, Take 1 tablet (5 mg total) by mouth at bedtime., Disp: 90 tablet, Rfl: 3  Labs Labs do not reveal any evidence of leukocytosis, show mild hyperglycemia but no other chemical  abnormalities.  Imaging I have reviewed images in epic and the results pertinent to this consultation are: MRI of the brain done a few hours ago-limited MRI, motion riddled but no acute stroke.  Assessment: 65 year old past medical history of hyperlipidemia hypertension, diabetes, Bell's palsy with no residual deficit which was on the right side when it happened, presented initially night of 07/09/2021 as an acute code stroke for headache has been going on for a week but new onset speech difficulty and confusion.  Imaging was negative when a code stroke was run.  Migraine cocktail was given and he was discharged home.  Wife brought him back because he remains very drowsy and lethargic and there was question of worsening right-sided facial droop.  On my examination, he is definitely drowsy but I did not appreciate any other focalities.  There was report of temperature up to 100 Fahrenheit but no true fever. His neck is supple. There is no leukocytosis on his labs. I doubt that this is a CNS infection but given the fact that he is altered and off of his baseline and has had headache for over a week, a CNS infectious cause should be evaluated for  Other differentials include seizures versus toxin ingestion such as sedating medications.  This could also be leftover side effects from the migraine cocktail that he received earlier in the night.  Wife says he was not fully awake when he was discharged-and might be more sleepy now. Also has possible history of sleep apnea and might be slightly hypercarbic as well.  Low suspicion for GCA given no history of jaw claudication or temporal tenderness, but the persistent headache at his age should make GCA in the differentials.  Given that we do not have a clear answer for his presentation, I would recommend at least observing him overnight and recommendations are below.  Impression: Encephalopathy, likely metabolic Rule out CNS  infection  Recommendations: Because of the persistent headache for a week or over that, and mentation changes, I would recommend a spinal tap-send CSF for cell count, glucose, protein, gram stain, HSV PCR. Blood cultures Chest x-ray Urinalysis Decision on antibiotics based on the CSF studies.  If CSF is not obtainable or spinal tap is difficult and requires IR, might cover him with broad-spectrum CNS antibiotic and antiviral coverage. Routine EEG in the morning Check ammonia levels Check B12, TSH Check urinary toxicology screen Check ABG Check ESR  Plan relayed to Dr. Randal Buba  -- Amie Portland, MD Neurologist [facility] Pager: [phone]

## 2021-07-10 NOTE — ED Provider Notes (Signed)
Emergency Medicine Provider Triage Evaluation Note  Jason [name] , a 66 y.o. male  was evaluated in triage.  Pt complains of confusion/altered mental status.  Seen earlier as a code stroke.  Was released a few hours ago.  Wife says he is not acting right.  Low grade temp in triage (100).  Review of Systems  Positive: Headache, confusion Negative: Vomiting, diarrhea  Physical Exam  BP 136/78 (BP Location: Left Arm)   Pulse 96   Temp 100 F (37.8 C) (Oral)   Resp 14   SpO2 99%  Gen:   confused Resp:  Normal effort  MSK:   Moves extremities without difficulty  Other:    Medical Decision Making  Medically screening exam initiated at 1:40 AM.  Appropriate orders placed.  TAYLOR BRODERSEN was informed that the remainder of the evaluation will be completed by another provider, this initial triage assessment does not replace that evaluation, and the importance of remaining in the ED until their evaluation is complete.  Confusion   Montine Circle, Hershal Coria 07/10/21 0142    Palumbo, April, MD 07/10/21 0230

## 2021-07-10 NOTE — Progress Notes (Signed)
Pharmacy Antibiotic Note  Jason [name] is a 65 y.o. male admitted on 07/10/2021 with  headache/confusion .  Pharmacy has been consulted for Vancomycin/Acyclovir dosing for r/o meningitis. WBC WNL. Renal function good.   Plan: Vancomycin 2000 mg IV x 1, then 1250 mg IV q12h >>Estimated AUC: 461 Ceftriaxone 2g IV q12h Ampicillin 2g IV q4h Acyclovir 1000 mg IV q8h Trend WBC, temp, renal function  F/U infectious work-up Drug levels as indicated   Weight: 109.3 kg (241 lb) (from 06/17/21)  Temp (24hrs), Avg:98.8 F (37.1 C), Min:97.6 F (36.4 C), Max:100 F (37.8 C)  Recent Labs  Lab 07/09/21 1922 07/09/21 1926 07/10/21 0349  WBC 8.9  --  9.9  CREATININE 1.19 1.10  --     Estimated Creatinine Clearance: 86.7 mL/min (by C-G formula based on SCr of 1.1 mg/dL).    Allergies  Allergen Reactions   Lisinopril     hyperkalemia   Reglan [Metoclopramide] Anxiety    Patient had bad reaction requiring ativan in addition to benadryl. He doesn't want to receive it any more    Narda Bonds, PharmD, BCPS Clinical Pharmacist Phone: [phone]

## 2021-07-10 NOTE — Procedures (Signed)
Patient Name: Jason [name]  MRN: [id number]  Epilepsy Attending: Lora Havens  Referring Physician/Provider: Dr Kerney Elbe Date: 07/10/2021 Duration: 25.22 mins  Patient history: 65yo M with new onset speech difficulty and confusion. EEG to evaluate for seizure  Level of alertness: Awake, asleep  AEDs during EEG study: None  Technical aspects: This EEG study was done with scalp electrodes positioned according to the 10-20 International system of electrode placement. Electrical activity was acquired at a sampling rate of '500Hz'$  and reviewed with a high frequency filter of '70Hz'$  and a low frequency filter of '1Hz'$ . EEG data were recorded continuously and digitally stored.   Description: The posterior dominant rhythm consists of 8-'9Hz'$  activity of moderate voltage (25-35 uV) seen predominantly in posterior head regions, symmetric and reactive to eye opening and eye closing. Sleep was characterized by vertex waves, sleep spindles (12 to 14 Hz), maximal frontocentral region. Physiologic photic driving was seen during photic stimulation.  Hyperventilation was not performed.     IMPRESSION: This study is within normal limits. No seizures or epileptiform discharges were seen throughout the recording.  [name]

## 2021-07-10 NOTE — H&P (Signed)
History and Physical    Jason [name] SWF:[id number] DOB: 1956-01-13 DOA: 07/10/2021  PCP: [name], NP Consultants:  Loletha Carrow - GI Patient coming from:  Home - lives with girlfriend; NOK: Girlfriend, [phone]  Chief Complaint: AMS  HPI: Jason [name] is a 65 y.o. male with medical history significant of DM and HTN presenting with AMS after prior ER visit earlier in the day for the same.  He has been complaining of an intermittent headache for about a week, was taking Advil and ES migraine formulation.  Yesterday, he was not acting himself - tried to walk outside and mumbled, incoherent speech.  He girlfriend was concerned a stroke.  He has h/o Bell's palsy and it seemed similar, dazed, confused, combative.  She brought him to the ER and CT/MRI unremarkable. He was discharged and he got worse and she called EMS again within an hour.  He was "out of it."  He got Reglan "and that just set everything off.  It was like he was on acid.  He doesn't do drugs but it was like he was coming off a really big high."  He was combative, very unusual.  He stripped himself naked.    ED Course: Carryover, per Dr. Nevada Crane:  65 year old presents as a code stroke.  Suspected meningitis.  Neurology following.  Dr. Rory Percy attempting LP.   Review of Systems: unable to perform  Ambulatory Status:  Ambulates without assistance  COVID Vaccine Status:  Complete plus booster  Past Medical History:  Diagnosis Date   Bell palsy    Diabetes mellitus without complication (HCC)    HTN (hypertension)     Past Surgical History:  Procedure Laterality Date   COLONOSCOPY W/ POLYPECTOMY      Social History   Socioeconomic History   Marital status: Married    Spouse name: Not on file   Number of children: 2   Years of education: 12   Highest education level: Not on file  Occupational History   Occupation: transportation    Comment: Publishing rights manager  Tobacco Use   Smoking status: Never    Smokeless tobacco: Never  Vaping Use   Vaping Use: Never used  Substance and Sexual Activity   Alcohol use: Yes    Alcohol/week: 6.0 standard drinks    Types: 6 Cans of beer per week    Comment: on weekends   Drug use: No   Sexual activity: Not on file  Other Topics Concern   Not on file  Social History Narrative      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       Fun: fix cars, drag race    Social Determinants of Radio broadcast assistant Strain: Low Risk    Difficulty of Paying Living Expenses: Not hard at all  Food Insecurity: No Food Insecurity   Worried About Charity fundraiser in the Last Year: Never true   Arboriculturist in the Last Year: Never true  Transportation Needs: No Transportation Needs   Lack of Transportation (Medical): No   Lack of Transportation (Non-Medical): No  Physical Activity: Insufficiently Active   Days of Exercise per Week: 3 days   Minutes of Exercise per Session: 30 min  Stress: No Stress Concern Present   Feeling of Stress : Not at all  Social Connections: Moderately Isolated   Frequency of Communication with Friends and Family: More  than three times a week   Frequency of Social Gatherings with Friends and Family: More than three times a week   Attends Religious Services: More than 4 times per year   Active Member of Clubs or Organizations: No   Attends Archivist Meetings: Never   Marital Status: Widowed  Human resources officer Violence: Not At Risk   Fear of Current or Ex-Partner: No   Emotionally Abused: No   Physically Abused: No   Sexually Abused: No    Allergies  Allergen Reactions   Lisinopril     hyperkalemia   Reglan [Metoclopramide] Anxiety    Patient had bad reaction requiring ativan in addition to benadryl. He doesn't want to receive it any more    Family History  Problem Relation Age of Onset   Stroke Mother    Colon cancer Brother    High blood pressure Brother     Stomach cancer Brother    Colon cancer Other        1st degree relative <60   Diabetes Other        1st degree relative    Stroke Other    Colon cancer Sister    High blood pressure Sister    Esophageal cancer Neg Hx    Rectal cancer Neg Hx     Prior to Admission medications   Medication Sig Start Date End Date Taking? Authorizing Provider  Blood Glucose Monitoring Suppl (ONE TOUCH ULTRA 2) w/Device KIT Use the device daily to check your blood sugars 1-4 times as instructed. 05/19/20   Nafziger, Tommi Rumps, NP  butalbital-acetaminophen-caffeine (FIORICET) [phone] MG tablet Take 1 tablet by mouth every 6 (six) hours as needed for headache. 07/09/21 07/09/22  Drenda Freeze, MD  diltiazem (CARDIZEM CD) 120 MG 24 hr capsule TAKE 1 CAPSULE BY MOUTH EVERY DAY 06/17/21   Nafziger, Tommi Rumps, NP  glipiZIDE (GLUCOTROL XL) 10 MG 24 hr tablet Take 1 tablet (10 mg total) by mouth daily with breakfast. 06/17/21   Nafziger, Tommi Rumps, NP  glucose blood (ONE TOUCH ULTRA TEST) test strip USE TO TEST BLOOD GLUCOSE TWICE DAILY 06/17/21   Nafziger, Tommi Rumps, NP  JARDIANCE 25 MG TABS tablet TAKE 1 TABLET (25 MG TOTAL) BY MOUTH DAILY. 05/06/21   Nafziger, Tommi Rumps, NP  Lancets [facility] ULTRASOFT) lancets Use to check blood sugar BID 06/17/21   Nafziger, Tommi Rumps, NP  metFORMIN (GLUCOPHAGE XR) 500 MG 24 hr tablet Take 2 tablets (1,000 mg total) by mouth in the morning and at bedtime. 06/17/21 09/15/21  Nafziger, Tommi Rumps, NP  rosuvastatin (CRESTOR) 5 MG tablet Take 1 tablet (5 mg total) by mouth at bedtime. 06/17/21   [name], NP    Physical Exam: Vitals:   07/10/21 1400 07/10/21 1430 07/10/21 1444 07/10/21 1550  BP: 125/81 (!) 141/77  131/73  Pulse: 60 61  (!) 55  Resp: '18 16  18  ' Temp:   97.9 F (36.6 C) 98.1 F (36.7 C)  TempSrc:   Oral Oral  SpO2: 99% 100%  100%  Weight:         General:  Somnolent, follows commands and answers a few questions when directly instructed Eyes:   EOMI, normal lids, iris ENT:  grossly normal  hearing, lips & tongue, mmm Neck:  no LAD, masses or thyromegaly Cardiovascular:  RRR, no m/r/g. No LE edema.  Respiratory:   CTA bilaterally with no wheezes/rales/rhonchi.  Normal respiratory effort. Abdomen:  soft, NT, ND Back:   normal alignment, no CVAT Skin:  no  rash or induration seen on limited exam Musculoskeletal:  grossly normal tone BUE/BLE, good ROM, no bony abnormality Psychiatric:  somnolent and disinterested mood and affect, speech sparse but appropriate Neurologic:  unable to effectively perform    Radiological Exams on Admission: Independently reviewed - see discussion in A/P where applicable  CT Angio Head W or Wo Contrast  Result Date: 07/10/2021 CLINICAL DATA:  64 year old male. Persistent headache and confusion. Acute stroke suspected. EXAM: CT HEAD WITHOUT CONTRAST CTA HEAD AND NECK. TECHNIQUE: Contiguous axial images were obtained from the base of the skull through the vertex without intravenous contrast. Multi detector CTA of the head and neck during bolus administration of IV contrast. Multiplanar reformatted images, thick MIP images provided. COMPARISON:  Brain MRI 07/09/2021 at 2141 hours.  Head CT yesterday. FINDINGS: Brain: Mild motion artifact now. Stable non contrast CT appearance of the brain. No midline shift, ventriculomegaly, mass effect, evidence of mass lesion, intracranial hemorrhage or evidence of cortically based acute infarction. Vascular: No suspicious intracranial vascular hyperdensity. Skull: No acute osseous abnormality identified. Sinuses/Orbits: Visualized paranasal sinuses and mastoids are stable and well aerated. Other: Visualized orbits and scalp soft tissues are within normal limits. ASPECTS [facility] Stroke Program Early CT Score) Total score (0-10 with 10 being normal): 10 CTA NECK Skeleton: Absent and carious dentition. Mild for age cervical spine degeneration. No acute osseous abnormality identified. Upper chest: Atelectasis. Other neck:  Negative. Aortic arch: 3 vessel arch configuration.  No arch atherosclerosis. Right carotid system: Negative. Left carotid system: Negative. Vertebral arteries: Normal proximal right subclavian artery and right vertebral artery origin. Right vertebral artery appears patent and normal to the skull base. Normal proximal left subclavian artery and left vertebral artery origin. Left vertebral artery appears mildly dominant, mildly tortuous, patent and otherwise normal to the skull base. CTA HEAD Posterior circulation: Slightly dominant left vertebral artery V4 segment. Normal vertebral arteries to the vertebrobasilar junction. Normal PICA origins. Patent basilar artery without stenosis. Patent SCA and PCA origins. Both posterior communicating arteries are present. Bilateral PCA branches are within normal limits. Anterior circulation: Both ICA siphons are patent. No siphon plaque or stenosis. Normal ophthalmic and posterior communicating artery origins. Patent carotid termini, MCA and ACA origins. Left ACA A1 appears mildly dominant. Anterior communicating artery and bilateral ACA branches are within normal limits. Left MCA M1 segment and bifurcation are patent without stenosis. Left MCA branches are within normal limits. Right MCA M1 segment and trifurcation are within normal limits. Right MCA branches are within normal limits. Venous sinuses: Patent. Anatomic variants: Mildly dominant left vertebral artery, left ACA A1 segment. Review of the MIP images confirms the above findings IMPRESSION: 1. Negative CTA Head and Neck. No large vessel occlusion, no atherosclerosis or stenosis identified. 2. Stable and negative for age noncontrast CT appearance of the brain. 3. Poor dentition. Electronically Signed   By: Genevie Ann M.D.   On: 07/10/2021 04:44   MR BRAIN WO CONTRAST  Result Date: 07/09/2021 CLINICAL DATA:  Neuro deficit, acute stroke suspected. EXAM: MRI HEAD WITHOUT CONTRAST TECHNIQUE: Multiplanar, multiecho pulse  sequences of the brain and surrounding structures were obtained without intravenous contrast. COMPARISON:  Same day head CT. FINDINGS: Motion limited evaluation.  Within limitation: Brain: No acute infarction, hemorrhage, hydrocephalus, extra-axial collection or mass lesion. Mild scattered T2 hyperintensities in the white matter, nonspecific but most likely related to mild chronic microvascular ischemic disease. Vascular: Major arterial flow voids are maintained at the skull base. Skull and upper cervical spine: Normal marrow  signal. Sinuses/Orbits: Small left maxillary sinus retention cyst. Trace ethmoid air cell and frontal sinus mucosal thickening. No acute orbital findings. Other: No sizable mastoid effusions. IMPRESSION: 1. Motion limited exam without evidence of acute intracranial abnormality. Specifically, no acute infarct. 2. Mild chronic microvascular ischemic disease. Electronically Signed   By: Margaretha Sheffield MD   On: 07/09/2021 20:13   DG Chest Portable 1 View  Result Date: 07/10/2021 CLINICAL DATA:  65 year old male with altered mental status. EXAM: PORTABLE CHEST 1 VIEW COMPARISON:  Chest radiograph 05/24/2011. FINDINGS: Portable AP supine view at 0351 hours. Mildly rotated to the left. Lung volumes and mediastinal contours are stable and within normal limits. Visualized tracheal air column is within normal limits. Allowing for portable technique the lungs are clear. No pneumothorax or pleural effusion. No acute osseous abnormality identified. IMPRESSION: Negative portable chest. Electronically Signed   By: Genevie Ann M.D.   On: 07/10/2021 04:45   EEG adult  Result Date: 07/10/2021 Lora Havens, MD     07/10/2021  2:55 PM Patient Name: Jason [name] MRN: [id number] Epilepsy Attending: Lora Havens Referring Physician/Provider: Dr Kerney Elbe Date: 07/10/2021 Duration: 25.22 mins Patient history: 65yo M with new onset speech difficulty and confusion. EEG to evaluate for seizure Level  of alertness: Awake, asleep AEDs during EEG study: None Technical aspects: This EEG study was done with scalp electrodes positioned according to the 10-20 International system of electrode placement. Electrical activity was acquired at a sampling rate of '500Hz'  and reviewed with a high frequency filter of '70Hz'  and a low frequency filter of '1Hz' . EEG data were recorded continuously and digitally stored. Description: The posterior dominant rhythm consists of 8-'9Hz'  activity of moderate voltage (25-35 uV) seen predominantly in posterior head regions, symmetric and reactive to eye opening and eye closing. Sleep was characterized by vertex waves, sleep spindles (12 to 14 Hz), maximal frontocentral region. Physiologic photic driving was seen during photic stimulation.  Hyperventilation was not performed.   IMPRESSION: This study is within normal limits. No seizures or epileptiform discharges were seen throughout the recording. Lora Havens   CT HEAD CODE STROKE WO CONTRAST  Result Date: 07/09/2021 CLINICAL DATA:  Code stroke. Neuro deficit, acute, stroke suspected. Additional history provided: Headache and slurred speech. EXAM: CT HEAD WITHOUT CONTRAST TECHNIQUE: Contiguous axial images were obtained from the base of the skull through the vertex without intravenous contrast. COMPARISON:  Head CT 12/11/2007.  Brain MRI 03/08/2004. FINDINGS: Brain: The examination is significantly motion degraded at the level of the maxillofacial structures and skull base. Cerebral volume is normal for age. There is no acute intracranial hemorrhage. No demarcated cortical infarct. No extra-axial fluid collection. No evidence of an intracranial mass. No midline shift. Vascular: No hyperdense vessel. Skull: No calvarial fracture or focal suspicious calvarial lesion. Sinuses/Orbits: Visualized orbits show no acute finding. Left maxillary sinus mucous retention cyst. Trace mucosal thickening within the bilateral ethmoid air cells. ASPECTS  [facility] Stroke Program Early CT Score) - Ganglionic level infarction (caudate, lentiform nuclei, internal capsule, insula, M1-M3 cortex): 7 - Supraganglionic infarction (M4-M6 cortex): 3 Total score (0-10 with 10 being normal): 10 These results were communicated to Dr. Rory Percy at 7:40 pmon 7/29/2022by text page via the [facility] messaging system. IMPRESSION: Significantly motion degraded examination at the level of the maxillofacial structures and skull base. No evidence of acute intracranial abnormality Paranasal sinus disease, as described. Electronically Signed   By: Kellie Simmering DO   On: 07/09/2021 19:40  EKG: Independently reviewed.  NSR with rate 86; RBBB and LPFB   Labs on Admission: I have personally reviewed the available labs and imaging studies at the time of the admission.  Pertinent labs:   ABG: 7.366/40.4/71/23.2/93% Lipids: 150/61/79/49 Normal CBC ESR 8 TSH WNL UA: >500 glucose, 5 ketones COVID/flu  negative UDS: THC + ETOH <10 CMP unremarkable  Assessment/Plan Principal Problem:   AMS (altered mental status) Active Problems:   Essential hypertension   Type 2 diabetes mellitus (HCC)   Marijuana abuse   Class 1 obesity due to excess calories with body mass index (BMI) of 30.0 to 30.9 in adult   AMS -Patient presenting with intermittent headache and AMS -He was seen in the ER yesterday and had a negative CT and MRI so was discharged -He came back quickly and is quite somnolent -There is concern for meningitis -Neurology was unable to obtain LP and so has requested LP under fluoro -This is likely viral meningitis based on presentation of headache, AMS, but low suspicion for bacterial meningitis. -Will also need to cover empirically for aseptic meningitis with IV acyclovir; will try to maintain at least 3L IVF/day while the patient is taking IV acyclovir.  -Will also cover empirically for bacterial meningitis while awaiting LP with Rocephin, Vanc -EEG ordered -Neurology  consulted -Also started on high-dose B12 supplementation -THC is a consideration for this symptoms, particularly if it was laced with something else  DM -Recent A1c was 8.3, indicating poor control -hold Glucophage, Glucotrol, Jardiance -Cover with moderate-scale SSI   HTN -Continue Cardizem  Marijuana abuse -Cessation should be encouraged on an ongoing basis -UDS ordered   Obesity -Body mass index is 30.32 kg/m..  -Weight loss should be encouraged -Outpatient PCP/bariatric medicine f/u encouraged     Note: This patient has been tested and is negative for the novel coronavirus COVID-19. The patient has been fully vaccinated against COVID-19.   Level of care: Progressive DVT prophylaxis:  SCDs Code Status:  Full - confirmed with family Family Communication: Girlfriend was present throughout evaluation Disposition Plan:  The patient is from: home  Anticipated d/c is to: home without [facility] services once his mental status clears  Anticipated d/c date will depend on clinical response to treatment, likely up to 5 days if ongoing concern for aseptic meningitis  Patient is currently: acutely ill Consults called: Neurology; IR for LP; PT/OT  Admission status:  Admit - It is my clinical opinion that admission to INPATIENT is reasonable and necessary because of the expectation that this patient will require hospital care that crosses at least 2 midnights to treat this condition based on the medical complexity of the problems presented.  Given the aforementioned information, the predictability of an adverse outcome is felt to be significant.    Karmen Bongo MD [facility]   How to contact the [facility] Attending or Consulting provider [place] or covering provider during after hours [place], for this patient?  Check the care team in [facility] and look for a) attending/consulting [facility] provider listed and b) the [facility] team listed Log into [email or web address] and use [place]'s universal password to  access. If you do not have the password, please contact the hospital operator. Locate the [facility] provider you are looking for under [facility] and page to a number that you can be directly reached. If you still have difficulty reaching the provider, please page the [facility] (Director on Call) for the Hospitalists listed on amion for assistance.   07/10/2021,  5:49 PM

## 2021-07-10 NOTE — Plan of Care (Signed)

## 2021-07-11 DIAGNOSIS — I10 Essential (primary) hypertension: Secondary | ICD-10-CM | POA: Diagnosis not present

## 2021-07-11 LAB — URINE CULTURE
Culture: 20000 — AB
Special Requests: NORMAL

## 2021-07-11 LAB — BLOOD CULTURE ID PANEL (REFLEXED) - BCID2

## 2021-07-11 LAB — CBC WITH DIFFERENTIAL/PLATELET
Abs Immature Granulocytes: 0.01 10*3/uL (ref 0.00–0.07)
Basophils Absolute: 0 10*3/uL (ref 0.0–0.1)
Basophils Relative: 1 %
Eosinophils Absolute: 0.2 10*3/uL (ref 0.0–0.5)
Eosinophils Relative: 3 %
HCT: 41.2 % (ref 39.0–52.0)
Hemoglobin: 13.7 g/dL (ref 13.0–17.0)
Immature Granulocytes: 0 %
Lymphocytes Relative: 47 %
Lymphs Abs: 3.2 10*3/uL (ref 0.7–4.0)
MCH: 32.5 pg (ref 26.0–34.0)
MCHC: 33.3 g/dL (ref 30.0–36.0)
MCV: 97.6 fL (ref 80.0–100.0)
Monocytes Absolute: 0.5 10*3/uL (ref 0.1–1.0)
Monocytes Relative: 8 %
Neutro Abs: 2.7 10*3/uL (ref 1.7–7.7)
Neutrophils Relative %: 41 %
Platelets: 160 10*3/uL (ref 150–400)
RBC: 4.22 MIL/uL (ref 4.22–5.81)
RDW: 14.1 % (ref 11.5–15.5)
WBC: 6.7 10*3/uL (ref 4.0–10.5)
nRBC: 0 % (ref 0.0–0.2)

## 2021-07-11 LAB — COMPREHENSIVE METABOLIC PANEL
ALT: 25 U/L (ref 0–44)
AST: 21 U/L (ref 15–41)
Albumin: 3.3 g/dL — ABNORMAL LOW (ref 3.5–5.0)
Alkaline Phosphatase: 56 U/L (ref 38–126)
Anion gap: 9 (ref 5–15)
BUN: 16 mg/dL (ref 8–23)
CO2: 24 mmol/L (ref 22–32)
Calcium: 9.1 mg/dL (ref 8.9–10.3)
Chloride: 104 mmol/L (ref 98–111)
Creatinine, Ser: 1.14 mg/dL (ref 0.61–1.24)
GFR, Estimated: >60 mL/min (ref >60)
Glucose, Bld: 108 mg/dL — ABNORMAL HIGH (ref 70–99)
Potassium: 4.1 mmol/L (ref 3.5–5.1)
Sodium: 137 mmol/L (ref 135–145)
Total Bilirubin: 0.5 mg/dL (ref 0.3–1.2)
Total Protein: 7 g/dL (ref 6.5–8.1)

## 2021-07-11 LAB — GLUCOSE, CAPILLARY
Glucose-Capillary: 132 mg/dL — ABNORMAL HIGH (ref 70–99)
Glucose-Capillary: 131 mg/dL — ABNORMAL HIGH (ref 70–99)
Glucose-Capillary: 148 mg/dL — ABNORMAL HIGH (ref 70–99)
Glucose-Capillary: 135 mg/dL — ABNORMAL HIGH (ref 70–99)

## 2021-07-11 LAB — MAGNESIUM: Magnesium: 2.2 mg/dL (ref 1.7–2.4)

## 2021-07-11 LAB — BRAIN NATRIURETIC PEPTIDE: B Natriuretic Peptide: 52.7 pg/mL (ref 0.0–100.0)

## 2021-07-11 LAB — PROCALCITONIN: Procalcitonin: <0.1 ng/mL

## 2021-07-11 LAB — C-REACTIVE PROTEIN: CRP: 1 mg/dL — ABNORMAL HIGH (ref <1.0)

## 2021-07-11 MED ORDER — VANCOMYCIN HCL IN DEXTROSE 1-5 GM/200ML-% IV SOLN
1000.0000 mg | Freq: Two times a day (BID) | INTRAVENOUS | Status: DC
  Administered 2021-07-11 – 2021-07-12 (×3): 1000 mg via INTRAVENOUS
  Filled 2021-07-11 (×3): qty 200

## 2021-07-11 NOTE — Progress Notes (Signed)
Pharmacy Antibiotic Note  Jason [name] is a 65 y.o. male admitted on 07/10/2021 with  headache/confusion .  Pharmacy has been consulted for Vancomycin/Acyclovir dosing for r/o meningitis. WBC WNL. Scr improved to 1.14 from 1.35.   Plan: Adjust Vancomycin to 1 gm q 12 with Vd 0.5 (Closer to nomogram dosing for meningitis) Ceftriaxone 2g IV q12h Ampicillin 2g IV q4h Acyclovir 1000 mg IV q8h Trend WBC, temp, renal function  F/U infectious work-up and length of therapy  Drug levels as indicated   Weight: 109.3 kg (241 lb) (from 06/17/21)  Temp (24hrs), Avg:98.4 F (36.9 C), Min:97.9 F (36.6 C), Max:99.3 F (37.4 C)  Recent Labs  Lab 07/09/21 1922 07/09/21 1926 07/10/21 0349 07/10/21 1700 07/11/21 0132 07/11/21 0431  WBC 8.9  --  9.9  --   --  6.7  CREATININE 1.19 1.10  --  1.35* 1.14  --      Estimated Creatinine Clearance: 83.7 mL/min (by C-G formula based on SCr of 1.14 mg/dL).    Allergies  Allergen Reactions   Lisinopril     hyperkalemia   Reglan [Metoclopramide] Anxiety    Patient had bad reaction requiring ativan in addition to benadryl. He doesn't want to receive it any more    Jimmy Footman, PharmD, BCPS, [place] [facility] Clinical Pharmacist Phone: [phone] 07/11/2021

## 2021-07-11 NOTE — Plan of Care (Signed)

## 2021-07-11 NOTE — Evaluation (Signed)
Occupational Therapy Evaluation Patient Details Name: Jason [name] MRN: [id number] DOB: 1956/11/06 Today's Date: 07/11/2021    History of Present Illness 65 yo male s/p AMS episode and recurrent headaches. Failed lumbar puncture and to redo 7/31.+THC. PMHx: T2 DM.   Clinical Impression   Pt PTA: pt living with sig other reporting independence. Pt currently, performing toilet hygiene, ADL standing at sink, mobilizing in room and hallway with no physical assist and no AD. Pt following multi-step commands with no delayed processing. Pt modified independent for tasks. Pt does not require continued OT skilled services. OT signing off.    Follow Up Recommendations  No OT follow up    Equipment Recommendations  None recommended by OT    Recommendations for Other Services       Precautions / Restrictions Precautions Precautions: None Restrictions Weight Bearing Restrictions: No      Mobility Bed Mobility Overal bed mobility: Modified Independent                  Transfers Overall transfer level: Modified independent               General transfer comment: no physical assist    Balance Overall balance assessment: Modified Independent;Needs assistance Sitting-balance support: No upper extremity supported;Feet supported Sitting balance-Leahy Scale: Good       Standing balance-Leahy Scale: Good Standing balance comment: obstacles in hallway and large steps; manging stairs without assist                           ADL either performed or assessed with clinical judgement   ADL Overall ADL's : Modified independent                                       General ADL Comments: Pt performing toilet hygiene, ADL standing at sink, mobilizing in room and hallway with no physical assist and no AD. Pt modified independent for tasks.     Vision Baseline Vision/History: No visual deficits Patient Visual Report: No change from  baseline Vision Assessment?: Yes Eye Alignment: Within Functional Limits Ocular Range of Motion: Within Functional Limits Alignment/Gaze Preference: Within Defined Limits Tracking/Visual Pursuits: Able to track stimulus in all quads without difficulty     Perception     Praxis      Pertinent Vitals/Pain Pain Assessment: No/denies pain     Hand Dominance Right   Extremity/Trunk Assessment Upper Extremity Assessment Upper Extremity Assessment: Overall WFL for tasks assessed   Lower Extremity Assessment Lower Extremity Assessment: Overall WFL for tasks assessed   Cervical / Trunk Assessment Cervical / Trunk Assessment: Normal   Communication Communication Communication: No difficulties   Cognition Arousal/Alertness: Awake/alert Behavior During Therapy: WFL for tasks assessed/performed Overall Cognitive Status: Within Functional Limits for tasks assessed                                     General Comments  78 BPM with exertion    Exercises     Shoulder Instructions      Home Living Family/patient expects to be discharged to:: Private residence Living Arrangements: Spouse/significant other Available Help at Discharge: Family;Available 24 hours/day Type of Home: House Home Access: Stairs to enter [employer] of Steps: 5 Entrance Stairs-Rails: None Home Layout: One level  Bathroom Shower/Tub: Teacher, early years/pre: Standard     Home Equipment: Grab bars - tub/shower          Prior Functioning/Environment Level of Independence: Independent        Comments: driving        OT Problem List: Decreased activity tolerance      OT Treatment/Interventions:      OT Goals(Current goals can be found in the care plan section) Acute Rehab OT Goals Patient Stated Goal: to go home OT Goal Formulation: All assessment and education complete, DC therapy Potential to Achieve Goals: Good  OT Frequency:     Barriers to  D/C:            Co-evaluation              AM-PAC OT "6 Clicks" Daily Activity     Outcome Measure Help from another person eating meals?: None Help from another person taking care of personal grooming?: None Help from another person toileting, which includes using toliet, bedpan, or urinal?: None Help from another person bathing (including washing, rinsing, drying)?: None Help from another person to put on and taking off regular upper body clothing?: None Help from another person to put on and taking off regular lower body clothing?: None 6 Click Score: 24   End of Session Nurse Communication: Mobility status  Activity Tolerance: Patient tolerated treatment well Patient left: in bed;with call bell/phone within reach;with family/visitor present  OT Visit Diagnosis: Unsteadiness on feet (R26.81)                Time: [id number] OT Time Calculation (min): 28 min Charges:  OT General Charges $OT Visit: 1 Visit OT Evaluation $OT Eval Moderate Complexity: 1 Mod OT Treatments $Therapeutic Activity: 8-22 mins  Jefferey Pica, OTR/L Acute Rehabilitation Services Pager: [phone] Office: [phone]   Djuna Frechette C 07/11/2021, 1:25 PM

## 2021-07-11 NOTE — Progress Notes (Signed)
PT Cancellation Note  Patient Details Name: Jason [name] MRN: [id number] DOB: 1956/06/13   Cancelled Treatment:    Reason Eval/Treat Not Completed: PT screened, no needs identified, will sign off. Per OT pt totally independent with mobility.    Shary Decamp [facility] 07/11/2021, 9:37 AM [place] Pager [phone] Office [phone]

## 2021-07-11 NOTE — Progress Notes (Signed)
PROGRESS NOTE                                                                                                                                                                                                             Patient Demographics:    Jason [name], is a 65 y.o. male, DOB - 1956/09/13, [id number]  Outpatient Primary MD for the patient is [name], NP    LOS - 1  Admit date - 07/10/2021    Chief Complaint  Patient presents with   Altered Mental Status   Headache       Brief Narrative (HPI from H&P)  - Jason [name] is a 65 y.o. male with medical history significant of DM and HTN presenting with AMS after prior ER visit earlier in the day for the same.  He has been complaining of an intermittent headache for about a week, he was at times acting confused with mumbling speech.  He was brought to the ER where he was seen by neurology, bedside LP attempt failed.  He was placed on empiric coverage for meningitis and admitted for further work-up including fluoroscopy guided LP.   Subjective:   Patient in bed, appears comfortable, denies any headache, no fever, no chest pain or pressure, no shortness of breath , no abdominal pain. No new focal weakness.    Assessment  & Plan :     AMS with Intermittent Headaches - per Neuro proceed with Fluoro guided LP, bedside failed, empiric Meningitis coverage per Neuro, current stable, headache resolved, No fever - leukocytosis - stable CRP and Procal, B12 was low and THC ++. Monitor for now. LP under flouro due 07/11/21.  2. THC use - counseled to quit  3. Low B12, replace SQ here.  4. Dyslipidemia.  On statin.    5. Obesity: BMI 30, follow with PCP  6. 1/2 positive blood culture with gram-positive cocci likely contaminant.  Afebrile without leukocytosis, stable procalcitonin and CRP.  7. DM type II.  Sliding scale for now.  Lab Results  Component Value  Date   HGBA1C 8.3 (H) 06/17/2021   CBG (last 3)  Recent Labs    07/10/21 1602 07/10/21 2125 07/11/21 0609  GLUCAP 97 116* 132*      Recent Labs  Lab 07/09/21 1922 07/09/21 1926 07/09/21 2009 07/10/21 [id number] 07/10/21 [id number]  07/10/21 0433 07/10/21 0622 07/11/21 0132 07/11/21 0431  WBC 8.9  --   --   --  9.9  --   --   --  6.7  HGB 15.3 16.0  --   --  14.7 14.3  --   --  13.7  HCT 46.5 47.0  --   --  44.3 42.0  --   --  41.2  PLT 192  --   --   --  189  --   --   --  160  CRP  --   --   --   --  <0.5  --   --  1.0*  --   BNP  --   --   --   --   --   --  21.1 52.7  --   PROCALCITON  --   --   --   --  <0.10  --   --  <0.10  --   AST 29  --   --   --   --   --   --  21  --   ALT 30  --   --   --   --   --   --  25  --   ALKPHOS 62  --   --   --   --   --   --  56  --   BILITOT 0.6  --   --   --   --   --   --  0.5  --   ALBUMIN 4.0  --   --   --   --   --   --  3.3*  --   INR 1.1  --   --   --   --   --   --   --   --   SARSCOV2NAA  --   --  NEGATIVE NEGATIVE  --   --   --   --   --            Condition -  Guarded  Family Communication  :  wife bedside 07/11/21  Code Status :  Full  Consults  :  Neuro  PUD Prophylaxis :    Procedures  :     CT Head, CTA Head and Neck - Non acute  MRI Head - non acute      Disposition Plan  :    Status is: Inpatient  Remains inpatient appropriate because:IV treatments appropriate due to intensity of illness or inability to take PO  Dispo:  Patient From: Home  Planned Disposition: Home  Medically stable for discharge: No   DVT Prophylaxis  :    SCDs Start: 07/10/21 0458    Lab Results  Component Value Date   PLT 160 07/11/2021    Diet :  Diet Order             Diet Carb Modified Fluid consistency: Thin; Room service appropriate? Yes  Diet effective ____                    Inpatient Medications  Scheduled Meds:  cyanocobalamin  1,000 mcg Intramuscular Daily   diltiazem  120 mg Oral Daily    feeding supplement  237 mL Oral BID BM   insulin aspart  0-15 Units Subcutaneous TID WC   insulin aspart  0-5 Units Subcutaneous QHS   LORazepam  2 mg Intravenous Once   rosuvastatin  5 mg  Oral QHS   [START ON 07/16/2021] vitamin B-12  2,000 mcg Oral Daily   Continuous Infusions:  acyclovir 1,000 mg (07/11/21 0544)   ampicillin (OMNIPEN) IV 2 g (07/11/21 0846)   cefTRIAXone (ROCEPHIN)  IV 2 g (07/11/21 1031)   vancomycin     PRN Meds:.  Antibiotics  :    Anti-infectives (From admission, onward)    Start     Dose/Rate Route Frequency Ordered Stop   07/11/21 1100  vancomycin (VANCOCIN) IVPB 1000 mg/200 mL premix        1,000 mg 200 mL/hr over 60 Minutes Intravenous Every 12 hours 07/11/21 1009     07/10/21 2200  vancomycin (VANCOREADY) IVPB 1250 mg/250 mL  Status:  Discontinued        1,250 mg 166.7 mL/hr over 90 Minutes Intravenous Every 12 hours 07/10/21 0501 07/11/21 1009   07/10/21 0700  ampicillin (OMNIPEN) 2 g in sodium chloride 0.9 % 100 mL IVPB        2 g 300 mL/hr over 20 Minutes Intravenous Every 4 hours 07/10/21 0500     07/10/21 0600  acyclovir (ZOVIRAX) 1,000 mg in dextrose 5 % 150 mL IVPB        1,000 mg 170 mL/hr over 60 Minutes Intravenous Every 8 hours 07/10/21 0504     07/10/21 0515  vancomycin (VANCOREADY) IVPB 2000 mg/400 mL        2,000 mg 200 mL/hr over 120 Minutes Intravenous  Once 07/10/21 0500 07/10/21 1111   07/10/21 0515  cefTRIAXone (ROCEPHIN) 2 g in sodium chloride 0.9 % 100 mL IVPB        2 g 200 mL/hr over 30 Minutes Intravenous Every 12 hours 07/10/21 0500          Time Spent in minutes  30   Lala Lund M.D on 07/11/2021 at 10:52 AM  To page go to [email or web address]   [facility] -  Office  [phone]    See all Orders from today for further details    Objective:   Vitals:   07/10/21 2330 07/11/21 0405 07/11/21 0739 07/11/21 1032  BP: (!) 112/54 126/60 121/78 139/80  Pulse: (!) 54 66 (!) 53   Resp: '17 17 18   '$ Temp: 98.1  F (36.7 C) 98.3 F (36.8 C) 98.4 F (36.9 C)   TempSrc: Oral Oral Oral   SpO2: 100% 100% 99%   Weight:        Wt Readings from Last 3 Encounters:  07/10/21 109.3 kg  06/17/21 109.3 kg  03/04/21 114 kg     Intake/Output Summary (Last 24 hours) at 07/11/2021 1052 Last data filed at 07/11/2021 0720 Gross per 24 hour  Intake 1311.94 ml  Output 2000 ml  Net -688.06 ml     Physical Exam  Awake Alert, No new F.N deficits, Supple neck, negative Kernig's and Brudzinski sign Dale.AT,PERRAL Supple Neck,No JVD, No cervical lymphadenopathy appriciated.  Symmetrical Chest wall movement, Good air movement bilaterally, CTAB RRR,No Gallops, Rubs or new Murmurs, No Parasternal Heave +ve B.Sounds, Abd Soft, No tenderness, No organomegaly appriciated, No rebound - guarding or rigidity. No Cyanosis, Clubbing or edema, No new Rash or bruise    Data Review:    CBC Recent Labs  Lab 07/09/21 1922 07/09/21 1926 07/10/21 0349 07/10/21 0433 07/11/21 0431  WBC 8.9  --  9.9  --  6.7  HGB 15.3 16.0 14.7 14.3 13.7  HCT 46.5 47.0 44.3 42.0 41.2  PLT 192  --  189  --  160  MCV 97.9  --  97.6  --  97.6  MCH 32.2  --  32.4  --  32.5  MCHC 32.9  --  33.2  --  33.3  RDW 13.7  --  13.9  --  14.1  LYMPHSABS 5.3*  --  1.2  --  3.2  MONOABS 1.0  --  0.3  --  0.5  EOSABS 0.4  --  0.0  --  0.2  BASOSABS 0.2*  --  0.0  --  0.0    Recent Labs  Lab 07/09/21 1922 07/09/21 1926 07/10/21 0349 07/10/21 0412 07/10/21 0433 07/10/21 0622 07/10/21 1700 07/11/21 0132  NA 139 141  --   --  138  --  139 137  K 4.1 4.0  --   --  5.1  --  4.1 4.1  CL 104 107  --   --   --   --  106 104  CO2 23  --   --   --   --   --  25 24  GLUCOSE 115* 113*  --   --   --   --  105* 108*  BUN 13 15  --   --   --   --  17 16  CREATININE 1.19 1.10  --   --   --   --  1.35* 1.14  CALCIUM 9.8  --   --   --   --   --  9.3 9.1  AST 29  --   --   --   --   --   --  21  ALT 30  --   --   --   --   --   --  25  ALKPHOS 62   --   --   --   --   --   --  56  BILITOT 0.6  --   --   --   --   --   --  0.5  ALBUMIN 4.0  --   --   --   --   --   --  3.3*  MG  --   --  2.1  --   --   --   --  2.2  CRP  --   --  <0.5  --   --   --   --  1.0*  PROCALCITON  --   --  <0.10  --   --   --   --  <0.10  INR 1.1  --   --   --   --   --   --   --   TSH  --   --  2.119  --   --   --   --   --   AMMONIA  --   --   --  22  --   --   --   --   BNP  --   --   --   --   --  21.1  --  52.7    ------------------------------------------------------------------------------------------------------------------ Recent Labs    07/10/21 0349  CHOL 150  HDL 61  LDLCALC 79  TRIG 49  CHOLHDL 2.5    Lab Results  Component Value Date   HGBA1C 8.3 (H) 06/17/2021   ------------------------------------------------------------------------------------------------------------------ Recent Labs    07/10/21 0349  TSH 2.119    Cardiac Enzymes No results for input(s): CKMB, TROPONINI, MYOGLOBIN in the last 168 hours.  Invalid  input(s): CK ------------------------------------------------------------------------------------------------------------------    Component Value Date/Time   BNP 52.7 07/11/2021 0132    Micro Results Recent Results (from the past 240 hour(s))  Resp Panel by RT-PCR (Flu A&B, Covid) Nasopharyngeal Swab     Status: None   Collection Time: 07/09/21  8:09 PM   Specimen: Nasopharyngeal Swab; Nasopharyngeal(NP) swabs in vial transport medium  Result Value Ref Range Status   SARS Coronavirus 2 by RT PCR NEGATIVE NEGATIVE Final    Comment: (NOTE) SARS-CoV-2 target nucleic acids are NOT DETECTED.  The SARS-CoV-2 RNA is generally detectable in upper respiratory specimens during the acute phase of infection. The lowest concentration of SARS-CoV-2 viral copies this assay can detect is 138 copies/mL. A negative result does not preclude SARS-Cov-2 infection and should not be used as the sole basis for treatment  or other patient management decisions. A negative result may occur with  improper specimen collection/handling, submission of specimen other than nasopharyngeal swab, presence of viral mutation(s) within the areas targeted by this assay, and inadequate number of viral copies(<138 copies/mL). A negative result must be combined with clinical observations, patient history, and epidemiological information. The expected result is Negative.  Fact Sheet for Patients:  EntrepreneurPulse.com.au  Fact Sheet for Healthcare Providers:  IncredibleEmployment.be  This test is no t yet approved or cleared by the [place] FDA and  has been authorized for detection and/or diagnosis of SARS-CoV-2 by FDA under an Emergency Use Authorization (EUA). This EUA will remain  in effect (meaning this test can be used) for the duration of the COVID-19 declaration under Section 564(b)(1) of the Act, 21 U.S.C.section 360bbb-3(b)(1), unless the authorization is terminated  or revoked sooner.       Influenza A by PCR NEGATIVE NEGATIVE Final   Influenza B by PCR NEGATIVE NEGATIVE Final    Comment: (NOTE) The Xpert Xpress SARS-CoV-2/FLU/RSV plus assay is intended as an aid in the diagnosis of influenza from Nasopharyngeal swab specimens and should not be used as a sole basis for treatment. Nasal washings and aspirates are unacceptable for Xpert Xpress SARS-CoV-2/FLU/RSV testing.  Fact Sheet for Patients: EntrepreneurPulse.com.au  Fact Sheet for Healthcare Providers: IncredibleEmployment.be  This test is not yet approved or cleared by the [place] FDA and has been authorized for detection and/or diagnosis of SARS-CoV-2 by FDA under an Emergency Use Authorization (EUA). This EUA will remain in effect (meaning this test can be used) for the duration of the COVID-19 declaration under Section 564(b)(1) of the Act, 21 U.S.C. section  360bbb-3(b)(1), unless the authorization is terminated or revoked.  Performed at [facility], [place] [address] [place], [place] [address]   Resp Panel by RT-PCR (Flu A&B, Covid) Nasopharyngeal Swab     Status: None   Collection Time: 07/10/21  2:27 AM   Specimen: Nasopharyngeal Swab; Nasopharyngeal(NP) swabs in vial transport medium  Result Value Ref Range Status   SARS Coronavirus 2 by RT PCR NEGATIVE NEGATIVE Final    Comment: (NOTE) SARS-CoV-2 target nucleic acids are NOT DETECTED.  The SARS-CoV-2 RNA is generally detectable in upper respiratory specimens during the acute phase of infection. The lowest concentration of SARS-CoV-2 viral copies this assay can detect is 138 copies/mL. A negative result does not preclude SARS-Cov-2 infection and should not be used as the sole basis for treatment or other patient management decisions. A negative result may occur with  improper specimen collection/handling, submission of specimen other than nasopharyngeal swab, presence of viral mutation(s) within the areas targeted by this  assay, and inadequate number of viral copies(<138 copies/mL). A negative result must be combined with clinical observations, patient history, and epidemiological information. The expected result is Negative.  Fact Sheet for Patients:  EntrepreneurPulse.com.au  Fact Sheet for Healthcare Providers:  IncredibleEmployment.be  This test is no t yet approved or cleared by the [place] FDA and  has been authorized for detection and/or diagnosis of SARS-CoV-2 by FDA under an Emergency Use Authorization (EUA). This EUA will remain  in effect (meaning this test can be used) for the duration of the COVID-19 declaration under Section 564(b)(1) of the Act, 21 U.S.C.section 360bbb-3(b)(1), unless the authorization is terminated  or revoked sooner.       Influenza A by PCR NEGATIVE NEGATIVE Final   Influenza B by PCR NEGATIVE  NEGATIVE Final    Comment: (NOTE) The Xpert Xpress SARS-CoV-2/FLU/RSV plus assay is intended as an aid in the diagnosis of influenza from Nasopharyngeal swab specimens and should not be used as a sole basis for treatment. Nasal washings and aspirates are unacceptable for Xpert Xpress SARS-CoV-2/FLU/RSV testing.  Fact Sheet for Patients: EntrepreneurPulse.com.au  Fact Sheet for Healthcare Providers: IncredibleEmployment.be  This test is not yet approved or cleared by the [place] FDA and has been authorized for detection and/or diagnosis of SARS-CoV-2 by FDA under an Emergency Use Authorization (EUA). This EUA will remain in effect (meaning this test can be used) for the duration of the COVID-19 declaration under Section 564(b)(1) of the Act, 21 U.S.C. section 360bbb-3(b)(1), unless the authorization is terminated or revoked.  Performed at [facility], [place] [address] [place], [place] [address]   Blood culture (routine x 2)     Status: None (Preliminary result)   Collection Time: 07/10/21  3:49 AM   Specimen: BLOOD  Result Value Ref Range Status   Specimen Description BLOOD SITE NOT SPECIFIED  Final   Special Requests   Final    BOTTLES DRAWN AEROBIC AND ANAEROBIC Blood Culture adequate volume   Culture  Setup Time   Final    IN BOTH AEROBIC AND ANAEROBIC BOTTLES GRAM POSITIVE COCCI IN CLUSTERS CRITICAL RESULT CALLED TO, READ BACK BY AND VERIFIED WITHSalli Real, AT [id number] 07/11/21 [facility] Performed at [facility], [place] [address], [place],  [address]    Culture GRAM POSITIVE COCCI  Final   Report Status PENDING  Incomplete  Blood Culture ID Panel (Reflexed)     Status: Abnormal   Collection Time: 07/10/21  3:49 AM  Result Value Ref Range Status   Enterococcus faecalis NOT DETECTED NOT DETECTED Final   Enterococcus Faecium NOT DETECTED NOT DETECTED Final   Listeria monocytogenes NOT DETECTED NOT DETECTED  Final   Staphylococcus species DETECTED (A) NOT DETECTED Final    Comment: CRITICAL RESULT CALLED TO, READ BACK BY AND VERIFIED WITH: Denton Brick PHARMD, AT [id number] 07/11/21 D. VANHOOK    Staphylococcus aureus (BCID) NOT DETECTED NOT DETECTED Final   Staphylococcus epidermidis DETECTED (A) NOT DETECTED Final    Comment: CRITICAL RESULT CALLED TO, READ BACK BY AND VERIFIED WITH: Denton Brick PHARMD, AT [id number] 07/11/21 D. VANHOOK    Staphylococcus lugdunensis NOT DETECTED NOT DETECTED Final   Streptococcus species NOT DETECTED NOT DETECTED Final   Streptococcus agalactiae NOT DETECTED NOT DETECTED Final   Streptococcus pneumoniae NOT DETECTED NOT DETECTED Final   Streptococcus pyogenes NOT DETECTED NOT DETECTED Final   A.calcoaceticus-baumannii NOT DETECTED NOT DETECTED Final   Bacteroides fragilis NOT DETECTED NOT DETECTED  Final   Enterobacterales NOT DETECTED NOT DETECTED Final   Enterobacter cloacae complex NOT DETECTED NOT DETECTED Final   Escherichia coli NOT DETECTED NOT DETECTED Final   Klebsiella aerogenes NOT DETECTED NOT DETECTED Final   Klebsiella oxytoca NOT DETECTED NOT DETECTED Final   Klebsiella pneumoniae NOT DETECTED NOT DETECTED Final   Proteus species NOT DETECTED NOT DETECTED Final   Salmonella species NOT DETECTED NOT DETECTED Final   Serratia marcescens NOT DETECTED NOT DETECTED Final   Haemophilus influenzae NOT DETECTED NOT DETECTED Final   Neisseria meningitidis NOT DETECTED NOT DETECTED Final   Pseudomonas aeruginosa NOT DETECTED NOT DETECTED Final   Stenotrophomonas maltophilia NOT DETECTED NOT DETECTED Final   Candida albicans NOT DETECTED NOT DETECTED Final   Candida auris NOT DETECTED NOT DETECTED Final   Candida glabrata NOT DETECTED NOT DETECTED Final   Candida krusei NOT DETECTED NOT DETECTED Final   Candida parapsilosis NOT DETECTED NOT DETECTED Final   Candida tropicalis NOT DETECTED NOT DETECTED Final   Cryptococcus neoformans/gattii NOT DETECTED NOT DETECTED  Final   Methicillin resistance mecA/C NOT DETECTED NOT DETECTED Final    Comment: Performed at [facility], [place] [address] [place], [place] [address]    Radiology Reports CT Angio Head W or [place] Contrast  Result Date: 07/10/2021 CLINICAL DATA:  65 year old male. Persistent headache and confusion. Acute stroke suspected. EXAM: CT HEAD WITHOUT CONTRAST CTA HEAD AND NECK. TECHNIQUE: Contiguous axial images were obtained from the base of the skull through the vertex without intravenous contrast. Multi detector CTA of the head and neck during bolus administration of IV contrast. Multiplanar reformatted images, thick MIP images provided. COMPARISON:  Brain MRI 07/09/2021 at 2141 hours.  Head CT yesterday. FINDINGS: Brain: Mild motion artifact now. Stable non contrast CT appearance of the brain. No midline shift, ventriculomegaly, mass effect, evidence of mass lesion, intracranial hemorrhage or evidence of cortically based acute infarction. Vascular: No suspicious intracranial vascular hyperdensity. Skull: No acute osseous abnormality identified. Sinuses/Orbits: Visualized paranasal sinuses and mastoids are stable and well aerated. Other: Visualized orbits and scalp soft tissues are within normal limits. ASPECTS [facility] Stroke Program Early CT Score) Total score (0-10 with 10 being normal): 10 CTA NECK Skeleton: Absent and carious dentition. Mild for age cervical spine degeneration. No acute osseous abnormality identified. Upper chest: Atelectasis. Other neck: Negative. Aortic arch: 3 vessel arch configuration.  No arch atherosclerosis. Right carotid system: Negative. Left carotid system: Negative. Vertebral arteries: Normal proximal right subclavian artery and right vertebral artery origin. Right vertebral artery appears patent and normal to the skull base. Normal proximal left subclavian artery and left vertebral artery origin. Left vertebral artery appears mildly dominant, mildly tortuous, patent and  otherwise normal to the skull base. CTA HEAD Posterior circulation: Slightly dominant left vertebral artery V4 segment. Normal vertebral arteries to the vertebrobasilar junction. Normal PICA origins. Patent basilar artery without stenosis. Patent SCA and PCA origins. Both posterior communicating arteries are present. Bilateral PCA branches are within normal limits. Anterior circulation: Both ICA siphons are patent. No siphon plaque or stenosis. Normal ophthalmic and posterior communicating artery origins. Patent carotid termini, MCA and ACA origins. Left ACA A1 appears mildly dominant. Anterior communicating artery and bilateral ACA branches are within normal limits. Left MCA M1 segment and bifurcation are patent without stenosis. Left MCA branches are within normal limits. Right MCA M1 segment and trifurcation are within normal limits. Right MCA branches are within normal limits. Venous sinuses: Patent. Anatomic variants: Mildly dominant left  vertebral artery, left ACA A1 segment. Review of the MIP images confirms the above findings IMPRESSION: 1. Negative CTA Head and Neck. No large vessel occlusion, no atherosclerosis or stenosis identified. 2. Stable and negative for age noncontrast CT appearance of the brain. 3. Poor dentition. Electronically Signed   By: Genevie Ann M.D.   On: 07/10/2021 04:44   MR BRAIN WO CONTRAST  Result Date: 07/09/2021 CLINICAL DATA:  Neuro deficit, acute stroke suspected. EXAM: MRI HEAD WITHOUT CONTRAST TECHNIQUE: Multiplanar, multiecho pulse sequences of the brain and surrounding structures were obtained without intravenous contrast. COMPARISON:  Same day head CT. FINDINGS: Motion limited evaluation.  Within limitation: Brain: No acute infarction, hemorrhage, hydrocephalus, extra-axial collection or mass lesion. Mild scattered T2 hyperintensities in the white matter, nonspecific but most likely related to mild chronic microvascular ischemic disease. Vascular: Major arterial flow voids  are maintained at the skull base. Skull and upper cervical spine: Normal marrow signal. Sinuses/Orbits: Small left maxillary sinus retention cyst. Trace ethmoid air cell and frontal sinus mucosal thickening. No acute orbital findings. Other: No sizable mastoid effusions. IMPRESSION: 1. Motion limited exam without evidence of acute intracranial abnormality. Specifically, no acute infarct. 2. Mild chronic microvascular ischemic disease. Electronically Signed   By: Margaretha Sheffield MD   On: 07/09/2021 20:13   DG Chest Portable 1 View  Result Date: 07/10/2021 CLINICAL DATA:  65 year old male with altered mental status. EXAM: PORTABLE CHEST 1 VIEW COMPARISON:  Chest radiograph 05/24/2011. FINDINGS: Portable AP supine view at 0351 hours. Mildly rotated to the left. Lung volumes and mediastinal contours are stable and within normal limits. Visualized tracheal air column is within normal limits. Allowing for portable technique the lungs are clear. No pneumothorax or pleural effusion. No acute osseous abnormality identified. IMPRESSION: Negative portable chest. Electronically Signed   By: Genevie Ann M.D.   On: 07/10/2021 04:45   EEG adult  Result Date: 07/10/2021 Lora Havens, MD     07/10/2021  2:55 PM Patient Name: DREXLER FALCON MRN: [id number] Epilepsy Attending: Lora Havens Referring Physician/Provider: Dr Kerney Elbe Date: 07/10/2021 Duration: 25.22 mins Patient history: 65yo M with new onset speech difficulty and confusion. EEG to evaluate for seizure Level of alertness: Awake, asleep AEDs during EEG study: None Technical aspects: This EEG study was done with scalp electrodes positioned according to the 10-20 International system of electrode placement. Electrical activity was acquired at a sampling rate of '500Hz'$  and reviewed with a high frequency filter of '70Hz'$  and a low frequency filter of '1Hz'$ . EEG data were recorded continuously and digitally stored. Description: The posterior dominant rhythm  consists of 8-'9Hz'$  activity of moderate voltage (25-35 uV) seen predominantly in posterior head regions, symmetric and reactive to eye opening and eye closing. Sleep was characterized by vertex waves, sleep spindles (12 to 14 Hz), maximal frontocentral region. Physiologic photic driving was seen during photic stimulation.  Hyperventilation was not performed.   IMPRESSION: This study is within normal limits. No seizures or epileptiform discharges were seen throughout the recording. Lora Havens   CT HEAD CODE STROKE WO CONTRAST  Result Date: 07/09/2021 CLINICAL DATA:  Code stroke. Neuro deficit, acute, stroke suspected. Additional history provided: Headache and slurred speech. EXAM: CT HEAD WITHOUT CONTRAST TECHNIQUE: Contiguous axial images were obtained from the base of the skull through the vertex without intravenous contrast. COMPARISON:  Head CT 12/11/2007.  Brain MRI 03/08/2004. FINDINGS: Brain: The examination is significantly motion degraded at the level of the maxillofacial structures and skull  base. Cerebral volume is normal for age. There is no acute intracranial hemorrhage. No demarcated cortical infarct. No extra-axial fluid collection. No evidence of an intracranial mass. No midline shift. Vascular: No hyperdense vessel. Skull: No calvarial fracture or focal suspicious calvarial lesion. Sinuses/Orbits: Visualized orbits show no acute finding. Left maxillary sinus mucous retention cyst. Trace mucosal thickening within the bilateral ethmoid air cells. ASPECTS [facility] Stroke Program Early CT Score) - Ganglionic level infarction (caudate, lentiform nuclei, internal capsule, insula, M1-M3 cortex): 7 - Supraganglionic infarction (M4-M6 cortex): 3 Total score (0-10 with 10 being normal): 10 These results were communicated to Dr. Rory Percy at 7:40 pmon 7/29/2022by text page via the [facility] messaging system. IMPRESSION: Significantly motion degraded examination at the level of the maxillofacial structures and  skull base. No evidence of acute intracranial abnormality Paranasal sinus disease, as described. Electronically Signed   By: Kellie Simmering DO   On: 07/09/2021 19:40

## 2021-07-11 NOTE — Therapy (Signed)
   Pt is back to baseline for ADL and mobility- managing steps and balance is good. Pt denies headache throughout session. Pt and spouse agree that pt is back to his "normal self." HR 78 BPM with exertion. Formal OT eval to follow. No DME required. No further OT skilled services. OT signing off.  Jefferey Pica, OTR/L Acute Rehabilitation Services Pager: [phone] Office: [phone]'

## 2021-07-11 NOTE — Progress Notes (Signed)
PHARMACY - PHYSICIAN COMMUNICATION CRITICAL VALUE ALERT - BLOOD CULTURE IDENTIFICATION (BCID)  Jason [name] is an 65 y.o. male who presented to [facility] on 07/10/2021 with a chief complaint of AMS, possible meningitis  Assessment:   1/2 blood cultures growing Staph epidermidis, possible contaminant   Name of physician (or Provider) Contacted:  Dr. Hal Hope  Current antibiotics:  Vancomycin and Rocephin  Changes to prescribed antibiotics recommended:  No changes at this time  Results for orders placed or performed during the hospital encounter of 07/10/21  Blood Culture ID Panel (Reflexed) (Collected: 07/10/2021  3:49 AM)  Result Value Ref Range   Enterococcus faecalis NOT DETECTED NOT DETECTED   Enterococcus Faecium NOT DETECTED NOT DETECTED   Listeria monocytogenes NOT DETECTED NOT DETECTED   Staphylococcus species DETECTED (A) NOT DETECTED   Staphylococcus aureus (BCID) NOT DETECTED NOT DETECTED   Staphylococcus epidermidis DETECTED (A) NOT DETECTED   Staphylococcus lugdunensis NOT DETECTED NOT DETECTED   Streptococcus species NOT DETECTED NOT DETECTED   Streptococcus agalactiae NOT DETECTED NOT DETECTED   Streptococcus pneumoniae NOT DETECTED NOT DETECTED   Streptococcus pyogenes NOT DETECTED NOT DETECTED   A.calcoaceticus-baumannii NOT DETECTED NOT DETECTED   Bacteroides fragilis NOT DETECTED NOT DETECTED   Enterobacterales NOT DETECTED NOT DETECTED   Enterobacter cloacae complex NOT DETECTED NOT DETECTED   Escherichia coli NOT DETECTED NOT DETECTED   Klebsiella aerogenes NOT DETECTED NOT DETECTED   Klebsiella oxytoca NOT DETECTED NOT DETECTED   Klebsiella pneumoniae NOT DETECTED NOT DETECTED   Proteus species NOT DETECTED NOT DETECTED   Salmonella species NOT DETECTED NOT DETECTED   Serratia marcescens NOT DETECTED NOT DETECTED   Haemophilus influenzae NOT DETECTED NOT DETECTED   Neisseria meningitidis NOT DETECTED NOT DETECTED   Pseudomonas aeruginosa NOT  DETECTED NOT DETECTED   Stenotrophomonas maltophilia NOT DETECTED NOT DETECTED   Candida albicans NOT DETECTED NOT DETECTED   Candida auris NOT DETECTED NOT DETECTED   Candida glabrata NOT DETECTED NOT DETECTED   Candida krusei NOT DETECTED NOT DETECTED   Candida parapsilosis NOT DETECTED NOT DETECTED   Candida tropicalis NOT DETECTED NOT DETECTED   Cryptococcus neoformans/gattii NOT DETECTED NOT DETECTED   Methicillin resistance mecA/C NOT DETECTED NOT DETECTED    [name] 07/11/2021  4:20 AM

## 2021-07-12 ENCOUNTER — Inpatient Hospital Stay (HOSPITAL_COMMUNITY): Payer: Medicare Other

## 2021-07-12 DIAGNOSIS — G4730 Sleep apnea, unspecified: Secondary | ICD-10-CM | POA: Diagnosis not present

## 2021-07-12 DIAGNOSIS — F1220 Cannabis dependence, uncomplicated: Secondary | ICD-10-CM | POA: Diagnosis not present

## 2021-07-12 DIAGNOSIS — I10 Essential (primary) hypertension: Secondary | ICD-10-CM | POA: Diagnosis not present

## 2021-07-12 DIAGNOSIS — G9341 Metabolic encephalopathy: Secondary | ICD-10-CM

## 2021-07-12 LAB — COMPREHENSIVE METABOLIC PANEL
ALT: 25 U/L (ref 0–44)
AST: 22 U/L (ref 15–41)
Albumin: 3.3 g/dL — ABNORMAL LOW (ref 3.5–5.0)
Alkaline Phosphatase: 55 U/L (ref 38–126)
Anion gap: 9 (ref 5–15)
BUN: 15 mg/dL (ref 8–23)
CO2: 24 mmol/L (ref 22–32)
Calcium: 9.5 mg/dL (ref 8.9–10.3)
Chloride: 102 mmol/L (ref 98–111)
Creatinine, Ser: 1.11 mg/dL (ref 0.61–1.24)
GFR, Estimated: >60 mL/min (ref >60)
Glucose, Bld: 144 mg/dL — ABNORMAL HIGH (ref 70–99)
Potassium: 4.5 mmol/L (ref 3.5–5.1)
Sodium: 135 mmol/L (ref 135–145)
Total Bilirubin: 0.8 mg/dL (ref 0.3–1.2)
Total Protein: 7.2 g/dL (ref 6.5–8.1)

## 2021-07-12 LAB — CBC WITH DIFFERENTIAL/PLATELET
Abs Immature Granulocytes: 0.02 10*3/uL (ref 0.00–0.07)
Basophils Absolute: 0 10*3/uL (ref 0.0–0.1)
Basophils Relative: 1 %
Eosinophils Absolute: 0.2 10*3/uL (ref 0.0–0.5)
Eosinophils Relative: 4 %
HCT: 42.6 % (ref 39.0–52.0)
Hemoglobin: 14.4 g/dL (ref 13.0–17.0)
Immature Granulocytes: 0 %
Lymphocytes Relative: 36 %
Lymphs Abs: 2.2 10*3/uL (ref 0.7–4.0)
MCH: 32.6 pg (ref 26.0–34.0)
MCHC: 33.8 g/dL (ref 30.0–36.0)
MCV: 96.4 fL (ref 80.0–100.0)
Monocytes Absolute: 0.5 10*3/uL (ref 0.1–1.0)
Monocytes Relative: 8 %
Neutro Abs: 3.1 10*3/uL (ref 1.7–7.7)
Neutrophils Relative %: 51 %
Platelets: 159 10*3/uL (ref 150–400)
RBC: 4.42 MIL/uL (ref 4.22–5.81)
RDW: 13.4 % (ref 11.5–15.5)
WBC: 6.1 10*3/uL (ref 4.0–10.5)
nRBC: 0 % (ref 0.0–0.2)

## 2021-07-12 LAB — GLUCOSE, CAPILLARY
Glucose-Capillary: 134 mg/dL — ABNORMAL HIGH (ref 70–99)
Glucose-Capillary: 166 mg/dL — ABNORMAL HIGH (ref 70–99)
Glucose-Capillary: 192 mg/dL — ABNORMAL HIGH (ref 70–99)
Glucose-Capillary: 142 mg/dL — ABNORMAL HIGH (ref 70–99)

## 2021-07-12 LAB — C-REACTIVE PROTEIN: CRP: 0.5 mg/dL (ref <1.0)

## 2021-07-12 LAB — CSF CELL COUNT WITH DIFFERENTIAL
Lymphs, CSF: 98 % — ABNORMAL HIGH (ref 40–80)
Monocyte-Macrophage-Spinal Fluid: 2 % — ABNORMAL LOW (ref 15–45)
RBC Count, CSF: 2 /mm3 — ABNORMAL HIGH
Tube #: 3
WBC, CSF: 165 /mm3 (ref 0–5)

## 2021-07-12 LAB — PROCALCITONIN: Procalcitonin: <0.1 ng/mL

## 2021-07-12 LAB — GLUCOSE, CSF: Glucose, CSF: 66 mg/dL (ref 40–70)

## 2021-07-12 LAB — MAGNESIUM: Magnesium: 2.1 mg/dL (ref 1.7–2.4)

## 2021-07-12 LAB — PROTEIN, CSF: Total  Protein, CSF: 81 mg/dL — ABNORMAL HIGH (ref 15–45)

## 2021-07-12 LAB — BRAIN NATRIURETIC PEPTIDE: B Natriuretic Peptide: 54.3 pg/mL (ref 0.0–100.0)

## 2021-07-12 MED ORDER — LIDOCAINE HCL (PF) 1 % IJ SOLN
5.0000 mL | Freq: Once | INTRAMUSCULAR | Status: AC
  Administered 2021-07-12: 10 mL via INTRADERMAL

## 2021-07-12 MED ORDER — ACETAMINOPHEN 325 MG PO TABS
650.0000 mg | ORAL_TABLET | Freq: Four times a day (QID) | ORAL | Status: DC | PRN
  Administered 2021-07-12: 650 mg via ORAL
  Filled 2021-07-12: qty 2

## 2021-07-12 MED ORDER — SODIUM CHLORIDE 0.9 % IV SOLN: INTRAVENOUS | Status: DC

## 2021-07-12 MED ORDER — HYDRALAZINE HCL 20 MG/ML IJ SOLN: 10.0000 mg | Freq: Four times a day (QID) | INTRAMUSCULAR | Status: DC | PRN

## 2021-07-12 NOTE — Procedures (Signed)
Technically successful image guided L4-L5 lumbar puncture yielding 12 mL of clear CSF  Please see full dictation under Imaging tab in Epic.  Soyla Dryer, [place] [phone] 07/12/2021, 10:08 AM

## 2021-07-12 NOTE — Progress Notes (Signed)
Initial Nutrition Assessment  DOCUMENTATION CODES:   Not applicable  INTERVENTION:  -Continue Ensure Enlive po BID, each supplement provides 350 kcal and 20 grams of protein  NUTRITION DIAGNOSIS:   Inadequate oral intake related to decreased appetite as evidenced by other (comment) (per MST report).  GOAL:   Patient will meet greater than or equal to 90% of their needs  MONITOR:   PO intake, Supplement acceptance, Labs, I & O's, Weight trends  REASON FOR ASSESSMENT:   Malnutrition Screening Tool    ASSESSMENT:   Jason [name] with PMH significant for DM and HTN presenting with AMS. Bedside LP attempt failed and Jason [name] was placed on empiric coverage for meningitis and admitted for further work-up.  Jason [name] had L4L5 LP today yielding 35m clear CSF  Jason [name] unavailable at time of RD visit x2 attempts. No PO intake documented. Jason [name] with orders for Ensure Enlive/Plus BID and, per RN, is doing well with supplement thus far.  Medications: vitamin B12, SSI, IV abx Labs reviewed. CBGs 134-166  UOP: [id number] hours I/O: +1,301.4m78mince admit  No significant weight changes noted per available weight readings.   NUTRITION - FOCUSED PHYSICAL EXAM: Unable to perform at this time. Will attempt at follow-up.   Diet Order:   Diet Order             Diet heart healthy/carb modified Room service appropriate? Yes; Fluid consistency: Thin  Diet effective now                   EDUCATION NEEDS:   No education needs have been identified at this time  Skin:  Skin Assessment: Reviewed RN Assessment  Last BM:  7/29  Height:   Ht Readings from Last 1 Encounters:  06/17/21 6' 2.75" (1.899 m)    Weight:   Wt Readings from Last 10 Encounters:  07/10/21 109.3 kg  06/17/21 109.3 kg  03/04/21 114 kg  12/03/20 111.5 kg  09/03/20 111.9 kg  05/28/20 111.6 kg  09/05/19 112 kg  05/30/19 111.1 kg  05/16/19 111.1 kg  01/10/19 116.1 kg   BMI:  Body mass index is 30.32 kg/m.  Estimated  Nutritional Needs:   Kcal:  2000-2200  Protein:  125-135 grams  Fluid:  >2L    AmaLarkin InaS, RD, LDN (she/her/hers) RD pager number and weekend/on-call pager number located in [facility]

## 2021-07-12 NOTE — Progress Notes (Signed)
PROGRESS NOTE                                                                                                                                                                                                             Patient Demographics:    Jason [name], is a 65 y.o. male, DOB - 30-Dec-1955, [id number]  Outpatient Primary MD for the patient is [name], NP    LOS - 2  Admit date - 07/10/2021    Chief Complaint  Patient presents with   Altered Mental Status   Headache       Brief Narrative (HPI from H&P)  - Jason [name] is a 65 y.o. male with medical history significant of DM and HTN presenting with AMS after prior ER visit earlier in the day for the same.  He has been complaining of an intermittent headache for about a week, he was at times acting confused with mumbling speech.  He was brought to the ER where he was seen by neurology, bedside LP attempt failed.  He was placed on empiric coverage for meningitis and admitted for further work-up including fluoroscopy guided LP.   Subjective:   Patient in bed, appears comfortable, denies any headache, no fever, no chest pain or pressure, no shortness of breath , no abdominal pain. No new focal weakness.   Assessment  & Plan :     AMS with Intermittent Headaches - per Neuro proceed with Fluoro guided LP, bedside failed, empiric Meningitis coverage per Neuro, current stable, headache resolved, No fever - leukocytosis - stable CRP and Procal, B12 was low and THC ++. Monitor for now.  Also added RMSF and Lyme serology to blood work, LP under flouro due 07/12/21.  2. THC use - counseled to quit  3. Low B12, replace SQ here.  4. Dyslipidemia.  On statin.    5. Obesity: BMI 30, follow with PCP  6. 1/2 positive blood culture with gram-positive cocci likely contaminant.  Afebrile without leukocytosis, stable procalcitonin and CRP.  7. DM type II.  Sliding  scale for now.  Lab Results  Component Value Date   HGBA1C 8.3 (H) 06/17/2021   CBG (last 3)  Recent Labs    07/11/21 1621 07/11/21 2115 07/12/21 0621  GLUCAP 148* 135* 134*          Condition -  Guarded  Family Communication  :  wife bedside 07/11/21  Code Status :  Full  Consults  :  Neuro  PUD Prophylaxis :    Procedures  :     CT Head, CTA Head and Neck - Non acute  MRI Head - non acute      Disposition Plan  :    Status is: Inpatient  Remains inpatient appropriate because:IV treatments appropriate due to intensity of illness or inability to take PO  Dispo:  Patient From: Home  Planned Disposition: Home  Medically stable for discharge: No   DVT Prophylaxis  :    Place and maintain sequential compression device Start: 07/11/21 1821 SCDs Start: 07/10/21 0458    Lab Results  Component Value Date   PLT 159 07/12/2021    Diet :  Diet Order             Diet heart healthy/carb modified Room service appropriate? Yes; Fluid consistency: Thin  Diet effective now                    Inpatient Medications  Scheduled Meds:  cyanocobalamin  1,000 mcg Intramuscular Daily   diltiazem  120 mg Oral Daily   feeding supplement  237 mL Oral BID BM   insulin aspart  0-15 Units Subcutaneous TID WC   insulin aspart  0-5 Units Subcutaneous QHS   rosuvastatin  5 mg Oral QHS   [START ON 07/16/2021] vitamin B-12  2,000 mcg Oral Daily   Continuous Infusions:  acyclovir 170 mL/hr at 07/12/21 0608   ampicillin (OMNIPEN) IV 2 g (07/12/21 0859)   cefTRIAXone (ROCEPHIN)  IV Stopped (07/11/21 2156)   vancomycin Stopped (07/11/21 2338)   PRN Meds:.  Antibiotics  :    Anti-infectives (From admission, onward)    Start     Dose/Rate Route Frequency Ordered Stop   07/11/21 1100  vancomycin (VANCOCIN) IVPB 1000 mg/200 mL premix        1,000 mg 200 mL/hr over 60 Minutes Intravenous Every 12 hours 07/11/21 1009     07/10/21 2200  vancomycin (VANCOREADY) IVPB  1250 mg/250 mL  Status:  Discontinued        1,250 mg 166.7 mL/hr over 90 Minutes Intravenous Every 12 hours 07/10/21 0501 07/11/21 1009   07/10/21 0700  ampicillin (OMNIPEN) 2 g in sodium chloride 0.9 % 100 mL IVPB        2 g 300 mL/hr over 20 Minutes Intravenous Every 4 hours 07/10/21 0500     07/10/21 0600  acyclovir (ZOVIRAX) 1,000 mg in dextrose 5 % 150 mL IVPB        1,000 mg 170 mL/hr over 60 Minutes Intravenous Every 8 hours 07/10/21 0504     07/10/21 0515  vancomycin (VANCOREADY) IVPB 2000 mg/400 mL        2,000 mg 200 mL/hr over 120 Minutes Intravenous  Once 07/10/21 0500 07/10/21 1111   07/10/21 0515  cefTRIAXone (ROCEPHIN) 2 g in sodium chloride 0.9 % 100 mL IVPB        2 g 200 mL/hr over 30 Minutes Intravenous Every 12 hours 07/10/21 0500          Time Spent in minutes  30   Lala Lund M.D on 07/12/2021 at 10:06 AM  To page go to [email or web address]   [facility] -  Office  [phone]    See all Orders from today for further details    Objective:   Vitals:   07/11/21  1948 07/11/21 2333 07/12/21 0341 07/12/21 0825  BP: (!) 143/76 133/76 (!) 161/84 (!) 168/89  Pulse: 81 (!) 59 (!) 56 65  Resp: '18 17 18 20  '$ Temp: 98.1 F (36.7 C) 98.1 F (36.7 C) 97.9 F (36.6 C) 97.8 F (36.6 C)  TempSrc: Oral Oral Oral Oral  SpO2: 99% 98% 99% 100%  Weight:        Wt Readings from Last 3 Encounters:  07/10/21 109.3 kg  06/17/21 109.3 kg  03/04/21 114 kg     Intake/Output Summary (Last 24 hours) at 07/12/2021 1006 Last data filed at 07/12/2021 0608 Gross per 24 hour  Intake 1940 ml  Output --  Net 1940 ml     Physical Exam  Awake Alert, No new F.N deficits, Supple neck, negative Kernig's and Brudzinski sign Ledbetter.AT,PERRAL Supple Neck,No JVD, No cervical lymphadenopathy appriciated.  Symmetrical Chest wall movement, Good air movement bilaterally, CTAB RRR,No Gallops, Rubs or new Murmurs, No Parasternal Heave +ve B.Sounds, Abd Soft, No tenderness, No  organomegaly appriciated, No rebound - guarding or rigidity. No Cyanosis, Clubbing or edema, No new Rash or bruise     Data Review:    CBC Recent Labs  Lab 07/09/21 1922 07/09/21 1926 07/10/21 0349 07/10/21 0433 07/11/21 0431 07/12/21 0224  WBC 8.9  --  9.9  --  6.7 6.1  HGB 15.3 16.0 14.7 14.3 13.7 14.4  HCT 46.5 47.0 44.3 42.0 41.2 42.6  PLT 192  --  189  --  160 159  MCV 97.9  --  97.6  --  97.6 96.4  MCH 32.2  --  32.4  --  32.5 32.6  MCHC 32.9  --  33.2  --  33.3 33.8  RDW 13.7  --  13.9  --  14.1 13.4  LYMPHSABS 5.3*  --  1.2  --  3.2 2.2  MONOABS 1.0  --  0.3  --  0.5 0.5  EOSABS 0.4  --  0.0  --  0.2 0.2  BASOSABS 0.2*  --  0.0  --  0.0 0.0    Recent Labs  Lab 07/09/21 1922 07/09/21 1926 07/10/21 0349 07/10/21 0412 07/10/21 0433 07/10/21 0622 07/10/21 1700 07/11/21 0132 07/12/21 0224  NA 139 141  --   --  138  --  139 137 135  K 4.1 4.0  --   --  5.1  --  4.1 4.1 4.5  CL 104 107  --   --   --   --  106 104 102  CO2 23  --   --   --   --   --  '25 24 24  '$ GLUCOSE 115* 113*  --   --   --   --  105* 108* 144*  BUN 13 15  --   --   --   --  '17 16 15  '$ CREATININE 1.19 1.10  --   --   --   --  1.35* 1.14 1.11  CALCIUM 9.8  --   --   --   --   --  9.3 9.1 9.5  AST 29  --   --   --   --   --   --  21 22  ALT 30  --   --   --   --   --   --  25 25  ALKPHOS 62  --   --   --   --   --   --  56  55  BILITOT 0.6  --   --   --   --   --   --  0.5 0.8  ALBUMIN 4.0  --   --   --   --   --   --  3.3* 3.3*  MG  --   --  2.1  --   --   --   --  2.2 2.1  CRP  --   --  <0.5  --   --   --   --  1.0* 0.5  PROCALCITON  --   --  <0.10  --   --   --   --  <0.10 <0.10  INR 1.1  --   --   --   --   --   --   --   --   TSH  --   --  2.119  --   --   --   --   --   --   AMMONIA  --   --   --  22  --   --   --   --   --   BNP  --   --   --   --   --  21.1  --  52.7 54.3     ------------------------------------------------------------------------------------------------------------------ Recent Labs    07/10/21 0349  CHOL 150  HDL 61  LDLCALC 79  TRIG 49  CHOLHDL 2.5    Lab Results  Component Value Date   HGBA1C 8.3 (H) 06/17/2021   ------------------------------------------------------------------------------------------------------------------ Recent Labs    07/10/21 0349  TSH 2.119    Cardiac Enzymes No results for input(s): CKMB, TROPONINI, MYOGLOBIN in the last 168 hours.  Invalid input(s): CK ------------------------------------------------------------------------------------------------------------------    Component Value Date/Time   BNP 54.3 07/12/2021 0224    Micro Results Recent Results (from the past 240 hour(s))  Resp Panel by RT-PCR (Flu A&B, Covid) Nasopharyngeal Swab     Status: None   Collection Time: 07/09/21  8:09 PM   Specimen: Nasopharyngeal Swab; Nasopharyngeal(NP) swabs in vial transport medium  Result Value Ref Range Status   SARS Coronavirus 2 by RT PCR NEGATIVE NEGATIVE Final    Comment: (NOTE) SARS-CoV-2 target nucleic acids are NOT DETECTED.  The SARS-CoV-2 RNA is generally detectable in upper respiratory specimens during the acute phase of infection. The lowest concentration of SARS-CoV-2 viral copies this assay can detect is 138 copies/mL. A negative result does not preclude SARS-Cov-2 infection and should not be used as the sole basis for treatment or other patient management decisions. A negative result may occur with  improper specimen collection/handling, submission of specimen other than nasopharyngeal swab, presence of viral mutation(s) within the areas targeted by this assay, and inadequate number of viral copies(<138 copies/mL). A negative result must be combined with clinical observations, patient history, and epidemiological information. The expected result is Negative.  Fact Sheet for  Patients:  EntrepreneurPulse.com.au  Fact Sheet for Healthcare Providers:  IncredibleEmployment.be  This test is no t yet approved or cleared by the [place] FDA and  has been authorized for detection and/or diagnosis of SARS-CoV-2 by FDA under an Emergency Use Authorization (EUA). This EUA will remain  in effect (meaning this test can be used) for the duration of the COVID-19 declaration under Section 564(b)(1) of the Act, 21 U.S.C.section 360bbb-3(b)(1), unless the authorization is terminated  or revoked sooner.       Influenza A by PCR NEGATIVE NEGATIVE Final   Influenza B by  PCR NEGATIVE NEGATIVE Final    Comment: (NOTE) The Xpert Xpress SARS-CoV-2/FLU/RSV plus assay is intended as an aid in the diagnosis of influenza from Nasopharyngeal swab specimens and should not be used as a sole basis for treatment. Nasal washings and aspirates are unacceptable for Xpert Xpress SARS-CoV-2/FLU/RSV testing.  Fact Sheet for Patients: EntrepreneurPulse.com.au  Fact Sheet for Healthcare Providers: IncredibleEmployment.be  This test is not yet approved or cleared by the [place] FDA and has been authorized for detection and/or diagnosis of SARS-CoV-2 by FDA under an Emergency Use Authorization (EUA). This EUA will remain in effect (meaning this test can be used) for the duration of the COVID-19 declaration under Section 564(b)(1) of the Act, 21 U.S.C. section 360bbb-3(b)(1), unless the authorization is terminated or revoked.  Performed at [facility], [place] [address] [place], [place] [address]   Resp Panel by RT-PCR (Flu A&B, Covid) Nasopharyngeal Swab     Status: None   Collection Time: 07/10/21  2:27 AM   Specimen: Nasopharyngeal Swab; Nasopharyngeal(NP) swabs in vial transport medium  Result Value Ref Range Status   SARS Coronavirus 2 by RT PCR NEGATIVE NEGATIVE Final    Comment:  (NOTE) SARS-CoV-2 target nucleic acids are NOT DETECTED.  The SARS-CoV-2 RNA is generally detectable in upper respiratory specimens during the acute phase of infection. The lowest concentration of SARS-CoV-2 viral copies this assay can detect is 138 copies/mL. A negative result does not preclude SARS-Cov-2 infection and should not be used as the sole basis for treatment or other patient management decisions. A negative result may occur with  improper specimen collection/handling, submission of specimen other than nasopharyngeal swab, presence of viral mutation(s) within the areas targeted by this assay, and inadequate number of viral copies(<138 copies/mL). A negative result must be combined with clinical observations, patient history, and epidemiological information. The expected result is Negative.  Fact Sheet for Patients:  EntrepreneurPulse.com.au  Fact Sheet for Healthcare Providers:  IncredibleEmployment.be  This test is no t yet approved or cleared by the [place] FDA and  has been authorized for detection and/or diagnosis of SARS-CoV-2 by FDA under an Emergency Use Authorization (EUA). This EUA will remain  in effect (meaning this test can be used) for the duration of the COVID-19 declaration under Section 564(b)(1) of the Act, 21 U.S.C.section 360bbb-3(b)(1), unless the authorization is terminated  or revoked sooner.       Influenza A by PCR NEGATIVE NEGATIVE Final   Influenza B by PCR NEGATIVE NEGATIVE Final    Comment: (NOTE) The Xpert Xpress SARS-CoV-2/FLU/RSV plus assay is intended as an aid in the diagnosis of influenza from Nasopharyngeal swab specimens and should not be used as a sole basis for treatment. Nasal washings and aspirates are unacceptable for Xpert Xpress SARS-CoV-2/FLU/RSV testing.  Fact Sheet for Patients: EntrepreneurPulse.com.au  Fact Sheet for Healthcare  Providers: IncredibleEmployment.be  This test is not yet approved or cleared by the [place] FDA and has been authorized for detection and/or diagnosis of SARS-CoV-2 by FDA under an Emergency Use Authorization (EUA). This EUA will remain in effect (meaning this test can be used) for the duration of the COVID-19 declaration under Section 564(b)(1) of the Act, 21 U.S.C. section 360bbb-3(b)(1), unless the authorization is terminated or revoked.  Performed at [facility], [place] [address] [place],  [address]   Blood culture (routine x 2)     Status: Abnormal (Preliminary result)   Collection Time: 07/10/21  3:49 AM   Specimen: BLOOD  Result Value Ref Range Status   Specimen Description BLOOD SITE NOT SPECIFIED  Final   Special Requests   Final    BOTTLES DRAWN AEROBIC AND ANAEROBIC Blood Culture adequate volume   Culture  Setup Time   Final    IN BOTH AEROBIC AND ANAEROBIC BOTTLES GRAM POSITIVE COCCI IN CLUSTERS CRITICAL RESULT CALLED TO, READ BACK BY AND VERIFIED WITH: Denton Brick PHARMD, AT [id number] 07/11/21 D. Victoriano Lain    Culture (A)  Final    STAPHYLOCOCCUS EPIDERMIDIS CULTURE REINCUBATED FOR BETTER GROWTH Performed at [facility], [place] [address], [place], [place] [address]    Report Status PENDING  Incomplete  Blood Culture ID Panel (Reflexed)     Status: Abnormal   Collection Time: 07/10/21  3:49 AM  Result Value Ref Range Status   Enterococcus faecalis NOT DETECTED NOT DETECTED Final   Enterococcus Faecium NOT DETECTED NOT DETECTED Final   Listeria monocytogenes NOT DETECTED NOT DETECTED Final   Staphylococcus species DETECTED (A) NOT DETECTED Final    Comment: CRITICAL RESULT CALLED TO, READ BACK BY AND VERIFIED WITH: Denton Brick PHARMD, AT [id number] 07/11/21 D. VANHOOK    Staphylococcus aureus (BCID) NOT DETECTED NOT DETECTED Final   Staphylococcus epidermidis DETECTED (A) NOT DETECTED Final    Comment: CRITICAL RESULT CALLED TO, READ BACK  BY AND VERIFIED WITH: Denton Brick PHARMD, AT 0312 07/11/21 D. VANHOOK    Staphylococcus lugdunensis NOT DETECTED NOT DETECTED Final   Streptococcus species NOT DETECTED NOT DETECTED Final   Streptococcus agalactiae NOT DETECTED NOT DETECTED Final   Streptococcus pneumoniae NOT DETECTED NOT DETECTED Final   Streptococcus pyogenes NOT DETECTED NOT DETECTED Final   A.calcoaceticus-baumannii NOT DETECTED NOT DETECTED Final   Bacteroides fragilis NOT DETECTED NOT DETECTED Final   Enterobacterales NOT DETECTED NOT DETECTED Final   Enterobacter cloacae complex NOT DETECTED NOT DETECTED Final   Escherichia coli NOT DETECTED NOT DETECTED Final   Klebsiella aerogenes NOT DETECTED NOT DETECTED Final   Klebsiella oxytoca NOT DETECTED NOT DETECTED Final   Klebsiella pneumoniae NOT DETECTED NOT DETECTED Final   Proteus species NOT DETECTED NOT DETECTED Final   Salmonella species NOT DETECTED NOT DETECTED Final   Serratia marcescens NOT DETECTED NOT DETECTED Final   Haemophilus influenzae NOT DETECTED NOT DETECTED Final   Neisseria meningitidis NOT DETECTED NOT DETECTED Final   Pseudomonas aeruginosa NOT DETECTED NOT DETECTED Final   Stenotrophomonas maltophilia NOT DETECTED NOT DETECTED Final   Candida albicans NOT DETECTED NOT DETECTED Final   Candida auris NOT DETECTED NOT DETECTED Final   Candida glabrata NOT DETECTED NOT DETECTED Final   Candida krusei NOT DETECTED NOT DETECTED Final   Candida parapsilosis NOT DETECTED NOT DETECTED Final   Candida tropicalis NOT DETECTED NOT DETECTED Final   Cryptococcus neoformans/gattii NOT DETECTED NOT DETECTED Final   Methicillin resistance mecA/C NOT DETECTED NOT DETECTED Final    Comment: Performed at [facility], [address], [place], [place] [address]  Urine Culture     Status: Abnormal   Collection Time: 07/10/21  4:18 AM   Specimen: Urine, Clean Catch  Result Value Ref Range Status   Specimen Description URINE, CLEAN CATCH  Final   Special  Requests Normal  Final   Culture (A)  Final    20,000 COLONIES/mL STREPTOCOCCUS AGALACTIAE TESTING AGAINST S. AGALACTIAE NOT ROUTINELY PERFORMED DUE TO PREDICTABILITY OF AMP/PEN/VAN SUSCEPTIBILITY. Performed at [facility], [place] [address], [place], [place] [address]    Report Status  07/11/2021 FINAL  Final  Blood culture (routine x 2)     Status: None (Preliminary result)   Collection Time: 07/10/21  4:47 AM   Specimen: BLOOD  Result Value Ref Range Status   Specimen Description BLOOD SITE NOT SPECIFIED  Final   Special Requests   Final    BOTTLES DRAWN AEROBIC AND ANAEROBIC Blood Culture adequate volume   Culture   Final    NO GROWTH 2 DAYS Performed at [facility], [address] [place], [place] [address]    Report Status PENDING  Incomplete    Radiology Reports CT Angio Head W or Wo Contrast  Result Date: 07/10/2021 CLINICAL DATA:  65 year old male. Persistent headache and confusion. Acute stroke suspected. EXAM: CT HEAD WITHOUT CONTRAST CTA HEAD AND NECK. TECHNIQUE: Contiguous axial images were obtained from the base of the skull through the vertex without intravenous contrast. Multi detector CTA of the head and neck during bolus administration of IV contrast. Multiplanar reformatted images, thick MIP images provided. COMPARISON:  Brain MRI 07/09/2021 at 2141 hours.  Head CT yesterday. FINDINGS: Brain: Mild motion artifact now. Stable non contrast CT appearance of the brain. No midline shift, ventriculomegaly, mass effect, evidence of mass lesion, intracranial hemorrhage or evidence of cortically based acute infarction. Vascular: No suspicious intracranial vascular hyperdensity. Skull: No acute osseous abnormality identified. Sinuses/Orbits: Visualized paranasal sinuses and mastoids are stable and well aerated. Other: Visualized orbits and scalp soft tissues are within normal limits. ASPECTS [facility] Stroke Program Early CT Score) Total score (0-10 with 10 being normal):  10 CTA NECK Skeleton: Absent and carious dentition. Mild for age cervical spine degeneration. No acute osseous abnormality identified. Upper chest: Atelectasis. Other neck: Negative. Aortic arch: 3 vessel arch configuration.  No arch atherosclerosis. Right carotid system: Negative. Left carotid system: Negative. Vertebral arteries: Normal proximal right subclavian artery and right vertebral artery origin. Right vertebral artery appears patent and normal to the skull base. Normal proximal left subclavian artery and left vertebral artery origin. Left vertebral artery appears mildly dominant, mildly tortuous, patent and otherwise normal to the skull base. CTA HEAD Posterior circulation: Slightly dominant left vertebral artery V4 segment. Normal vertebral arteries to the vertebrobasilar junction. Normal PICA origins. Patent basilar artery without stenosis. Patent SCA and PCA origins. Both posterior communicating arteries are present. Bilateral PCA branches are within normal limits. Anterior circulation: Both ICA siphons are patent. No siphon plaque or stenosis. Normal ophthalmic and posterior communicating artery origins. Patent carotid termini, MCA and ACA origins. Left ACA A1 appears mildly dominant. Anterior communicating artery and bilateral ACA branches are within normal limits. Left MCA M1 segment and bifurcation are patent without stenosis. Left MCA branches are within normal limits. Right MCA M1 segment and trifurcation are within normal limits. Right MCA branches are within normal limits. Venous sinuses: Patent. Anatomic variants: Mildly dominant left vertebral artery, left ACA A1 segment. Review of the MIP images confirms the above findings IMPRESSION: 1. Negative CTA Head and Neck. No large vessel occlusion, no atherosclerosis or stenosis identified. 2. Stable and negative for age noncontrast CT appearance of the brain. 3. Poor dentition. Electronically Signed   By: Genevie Ann M.D.   On: 07/10/2021 04:44   MR  BRAIN WO CONTRAST  Result Date: 07/09/2021 CLINICAL DATA:  Neuro deficit, acute stroke suspected. EXAM: MRI HEAD WITHOUT CONTRAST TECHNIQUE: Multiplanar, multiecho pulse sequences of the brain and surrounding structures were obtained without intravenous contrast. COMPARISON:  Same day head CT. FINDINGS: Motion limited evaluation.  Within limitation: Brain: No acute infarction, hemorrhage, hydrocephalus, extra-axial collection or mass lesion. Mild scattered T2 hyperintensities in the white matter, nonspecific but most likely related to mild chronic microvascular ischemic disease. Vascular: Major arterial flow voids are maintained at the skull base. Skull and upper cervical spine: Normal marrow signal. Sinuses/Orbits: Small left maxillary sinus retention cyst. Trace ethmoid air cell and frontal sinus mucosal thickening. No acute orbital findings. Other: No sizable mastoid effusions. IMPRESSION: 1. Motion limited exam without evidence of acute intracranial abnormality. Specifically, no acute infarct. 2. Mild chronic microvascular ischemic disease. Electronically Signed   By: Margaretha Sheffield MD   On: 07/09/2021 20:13   DG Chest Portable 1 View  Result Date: 07/10/2021 CLINICAL DATA:  65 year old male with altered mental status. EXAM: PORTABLE CHEST 1 VIEW COMPARISON:  Chest radiograph 05/24/2011. FINDINGS: Portable AP supine view at 0351 hours. Mildly rotated to the left. Lung volumes and mediastinal contours are stable and within normal limits. Visualized tracheal air column is within normal limits. Allowing for portable technique the lungs are clear. No pneumothorax or pleural effusion. No acute osseous abnormality identified. IMPRESSION: Negative portable chest. Electronically Signed   By: Genevie Ann M.D.   On: 07/10/2021 04:45   EEG adult  Result Date: 07/10/2021 Lora Havens, MD     07/10/2021  2:55 PM Patient Name: RONTAVIOUS STAS MRN: [id number] Epilepsy Attending: Lora Havens Referring  Physician/Provider: Dr Kerney Elbe Date: 07/10/2021 Duration: 25.22 mins Patient history: 65yo M with new onset speech difficulty and confusion. EEG to evaluate for seizure Level of alertness: Awake, asleep AEDs during EEG study: None Technical aspects: This EEG study was done with scalp electrodes positioned according to the 10-20 International system of electrode placement. Electrical activity was acquired at a sampling rate of '500Hz'$  and reviewed with a high frequency filter of '70Hz'$  and a low frequency filter of '1Hz'$ . EEG data were recorded continuously and digitally stored. Description: The posterior dominant rhythm consists of 8-'9Hz'$  activity of moderate voltage (25-35 uV) seen predominantly in posterior head regions, symmetric and reactive to eye opening and eye closing. Sleep was characterized by vertex waves, sleep spindles (12 to 14 Hz), maximal frontocentral region. Physiologic photic driving was seen during photic stimulation.  Hyperventilation was not performed.   IMPRESSION: This study is within normal limits. No seizures or epileptiform discharges were seen throughout the recording. Lora Havens   CT HEAD CODE STROKE WO CONTRAST  Result Date: 07/09/2021 CLINICAL DATA:  Code stroke. Neuro deficit, acute, stroke suspected. Additional history provided: Headache and slurred speech. EXAM: CT HEAD WITHOUT CONTRAST TECHNIQUE: Contiguous axial images were obtained from the base of the skull through the vertex without intravenous contrast. COMPARISON:  Head CT 12/11/2007.  Brain MRI 03/08/2004. FINDINGS: Brain: The examination is significantly motion degraded at the level of the maxillofacial structures and skull base. Cerebral volume is normal for age. There is no acute intracranial hemorrhage. No demarcated cortical infarct. No extra-axial fluid collection. No evidence of an intracranial mass. No midline shift. Vascular: No hyperdense vessel. Skull: No calvarial fracture or focal suspicious calvarial  lesion. Sinuses/Orbits: Visualized orbits show no acute finding. Left maxillary sinus mucous retention cyst. Trace mucosal thickening within the bilateral ethmoid air cells. ASPECTS [facility] Stroke Program Early CT Score) - Ganglionic level infarction (caudate, lentiform nuclei, internal capsule, insula, M1-M3 cortex): 7 - Supraganglionic infarction (M4-M6 cortex): 3 Total score (0-10 with 10 being normal): 10 These results were communicated to Dr. Rory Percy at 7:40 pmon 7/29/2022by  text page via the [facility] messaging system. IMPRESSION: Significantly motion degraded examination at the level of the maxillofacial structures and skull base. No evidence of acute intracranial abnormality Paranasal sinus disease, as described. Electronically Signed   By: Kellie Simmering DO   On: 07/09/2021 19:40

## 2021-07-12 NOTE — Plan of Care (Signed)

## 2021-07-12 NOTE — Progress Notes (Signed)
NEUROLOGY CONSULTATION PROGRESS NOTE   Date of service: July 12, 2021 Patient Name: Jason [name] MRN:  [id number] DOB:  03-03-1956  Brief HPI  Jason [name] is a 65 y.o. male with PMH significant for Bell's palsy, diabetes, hypertension who was seen by her team for headache/drowsiness/lethargy/T-max of 100 and no meningismus and no leukocytosis.  He was initially discharged home but was brought back in by family with same day for persistent somnolence.  Work-up with MRI brain without contrast with no acute abnormalities.  U tox positive for THC.  He was placed on empiric antibiotics and failed bedside LP.  Routine EEG with no seizures or epileptiform discharges.  Noted to have low normal vitamin B12 levels and so was started on B12 supplement.   Interval Hx   Is back to his baseline.  He is awake alert talking.  Reports he had a headache last night was a 8 out of 10.  It resolved with Tylenol.  Endorses that he snores a lot, endorses excessive daytime sleepiness and reports that he has been scheduled for a outpatient sleep study by his PCP this month. Vitals   Vitals:   07/11/21 1948 07/11/21 2333 07/12/21 0341 07/12/21 0825  BP: (!) 143/76 133/76 (!) 161/84 (!) 168/89  Pulse: 81 (!) 59 (!) 56 65  Resp: '18 17 18 20  '$ Temp: 98.1 F (36.7 C) 98.1 F (36.7 C) 97.9 F (36.6 C) 97.8 F (36.6 C)  TempSrc: Oral Oral Oral Oral  SpO2: 99% 98% 99% 100%  Weight:         Body mass index is 30.32 kg/m.  Physical Exam   General: Laying comfortably in bed; in no acute distress.  HENT: Normal oropharynx and mucosa. Normal external appearance of ears and nose.  Neck: Supple, no pain or tenderness  CV: No JVD. No peripheral edema.  Pulmonary: Symmetric Chest rise. Normal respiratory effort.  Abdomen: Soft to touch, non-tender.  Ext: No cyanosis, edema, or deformity  Skin: No rash. Normal palpation of skin.   Musculoskeletal: Normal digits and nails by inspection. No clubbing.    Neurologic Examination  Mental status/Cognition: Alert, oriented to self, place, month and year, good attention.  Speech/language: Fluent, comprehension intact, object naming intact, repetition intact.  Cranial nerves:   CN II Pupils equal and reactive to light, no VF deficits    CN III,IV,VI EOM intact, no gaze preference or deviation, no nystagmus    CN V normal sensation in V1, V2, and V3 segments bilaterally    CN VII no asymmetry, no nasolabial fold flattening    CN VIII normal hearing to speech    CN IX & X normal palatal elevation, no uvular deviation    CN XI 5/5 head turn and 5/5 shoulder shrug bilaterally    CN XII midline tongue protrusion    Motor:  Muscle bulk: normal, tone normal, pronator drift none tremor none Mvmt Root Nerve  Muscle Right Left Comments  SA C5/6 Ax Deltoid     EF C5/6 Mc Biceps 5 5   EE C6/7/8 Rad Triceps 5 5   WF C6/7 Med FCR     WE C7/8 PIN ECU     F Ab C8/T1 U ADM/FDI 5 5   HF L1/2/3 Fem Illopsoas 5 5   KE L2/3/4 Fem Quad     DF L4/5 D Peron Tib Ant     PF S1/2 Tibial Grc/Sol      Reflexes:  Right Left Comments  Pectoralis  Biceps (C5/6) 2 2   Brachioradialis (C5/6) 2 2    Triceps (C6/7) 2 2    Patellar (L3/4) 2 2    Achilles (S1)      Hoffman      Plantar     Jaw jerk    Sensation:  Light touch intact   Pin prick    Temperature    Vibration   Proprioception    Coordination/Complex Motor:  - Finger to Nose intact - Rapid alternating movement are normal - Gait: Deferred.  Labs   Basic Metabolic Panel:  Lab Results  Component Value Date   NA 135 07/12/2021   K 4.5 07/12/2021   CO2 24 07/12/2021   GLUCOSE 144 (H) 07/12/2021   BUN 15 07/12/2021   CREATININE 1.11 07/12/2021   CALCIUM 9.5 07/12/2021   GFRNONAA >60 07/12/2021   GFRAA >60 05/25/2011   HbA1c:  Lab Results  Component Value Date   HGBA1C 8.3 (H) 06/17/2021   LDL:  Lab Results  Component Value Date   LDLCALC 79 07/10/2021   Urine Drug Screen:      Component Value Date/Time   LABOPIA NONE DETECTED 07/10/2021 0345   COCAINSCRNUR NONE DETECTED 07/10/2021 0345   COCAINSCRNUR NEGATIVE 05/24/2011 2012   LABBENZ NONE DETECTED 07/10/2021 0345   LABBENZ NEGATIVE 05/24/2011 2012   AMPHETMU NONE DETECTED 07/10/2021 0345   THCU POSITIVE (A) 07/10/2021 0345   LABBARB NONE DETECTED 07/10/2021 0345    Alcohol Level     Component Value Date/Time   ETH <10 07/10/2021 0143   No results found for: PHENYTOIN, ZONISAMIDE, LAMOTRIGINE, LEVETIRACETA No results found for: PHENYTOIN, PHENOBARB, VALPROATE, CBMZ  Imaging and Diagnostic studies   MRI brain without contrast: No acute abnormalities.  Vitamin B12 levels: 219.  Impression   Jason [name] is a 65 y.o. male with PMH significant as above who was evaluated by our team for headache, drowsiness, lethargic, T-max of 100.  Work-up so far with MRI brain without contrast, routine EEG with no acute abnormalities.  Was started on empiric antibiotics for potential meningitis and an LP is pending.  Suspect that his drowsiness and lethargy is probably due to excessive THC in contributed by underlying and undiagnosed OSA.  Recommendations  - Further recs pending LP. If findings are not concerning for meningitis, can discontinue antibiotics and discharge home. - Agree with out patient sleep study. ______________________________________________________________________   Thank you for the opportunity to take part in the care of this patient. If you have any further questions, please contact the neurology consultation attending.  Signed,  [place] Pager Number [phone]

## 2021-07-12 NOTE — Progress Notes (Signed)
Ok to resume NS '@50'$  ml/hr while on acyclovir to avoid nephrotoxicity per Dr Candiss Norse.  Onnie Boer, PharmD, BCIDP, AAHIVP, CPP Infectious Disease Pharmacist 07/12/2021 1:57 PM

## 2021-07-13 DIAGNOSIS — G039 Meningitis, unspecified: Secondary | ICD-10-CM | POA: Diagnosis not present

## 2021-07-13 DIAGNOSIS — I10 Essential (primary) hypertension: Secondary | ICD-10-CM | POA: Diagnosis not present

## 2021-07-13 LAB — MAGNESIUM: Magnesium: 2.1 mg/dL (ref 1.7–2.4)

## 2021-07-13 LAB — COMPREHENSIVE METABOLIC PANEL
ALT: 39 U/L (ref 0–44)
AST: 33 U/L (ref 15–41)
Albumin: 3.1 g/dL — ABNORMAL LOW (ref 3.5–5.0)
Alkaline Phosphatase: 51 U/L (ref 38–126)
Anion gap: 9 (ref 5–15)
BUN: 13 mg/dL (ref 8–23)
CO2: 25 mmol/L (ref 22–32)
Calcium: 9.3 mg/dL (ref 8.9–10.3)
Chloride: 102 mmol/L (ref 98–111)
Creatinine, Ser: 1.06 mg/dL (ref 0.61–1.24)
GFR, Estimated: >60 mL/min (ref >60)
Glucose, Bld: 134 mg/dL — ABNORMAL HIGH (ref 70–99)
Potassium: 4 mmol/L (ref 3.5–5.1)
Sodium: 136 mmol/L (ref 135–145)
Total Bilirubin: 0.4 mg/dL (ref 0.3–1.2)
Total Protein: 6.9 g/dL (ref 6.5–8.1)

## 2021-07-13 LAB — CBC WITH DIFFERENTIAL/PLATELET
Abs Immature Granulocytes: 0.02 10*3/uL (ref 0.00–0.07)
Basophils Absolute: 0 10*3/uL (ref 0.0–0.1)
Basophils Relative: 1 %
Eosinophils Absolute: 0.3 10*3/uL (ref 0.0–0.5)
Eosinophils Relative: 5 %
HCT: 42.5 % (ref 39.0–52.0)
Hemoglobin: 14 g/dL (ref 13.0–17.0)
Immature Granulocytes: 0 %
Lymphocytes Relative: 38 %
Lymphs Abs: 2.3 10*3/uL (ref 0.7–4.0)
MCH: 31.9 pg (ref 26.0–34.0)
MCHC: 32.9 g/dL (ref 30.0–36.0)
MCV: 96.8 fL (ref 80.0–100.0)
Monocytes Absolute: 0.5 10*3/uL (ref 0.1–1.0)
Monocytes Relative: 8 %
Neutro Abs: 2.9 10*3/uL (ref 1.7–7.7)
Neutrophils Relative %: 48 %
Platelets: 149 10*3/uL — ABNORMAL LOW (ref 150–400)
RBC: 4.39 MIL/uL (ref 4.22–5.81)
RDW: 13.4 % (ref 11.5–15.5)
WBC: 6 10*3/uL (ref 4.0–10.5)
nRBC: 0 % (ref 0.0–0.2)

## 2021-07-13 LAB — CULTURE, BLOOD (ROUTINE X 2): Special Requests: ADEQUATE

## 2021-07-13 LAB — ROCKY MTN SPOTTED FVR ABS PNL(IGG+IGM)
RMSF IgG: NEGATIVE
RMSF IgM: 0.52 index (ref 0.00–0.89)

## 2021-07-13 LAB — GLUCOSE, CAPILLARY
Glucose-Capillary: 158 mg/dL — ABNORMAL HIGH (ref 70–99)
Glucose-Capillary: 167 mg/dL — ABNORMAL HIGH (ref 70–99)
Glucose-Capillary: 287 mg/dL — ABNORMAL HIGH (ref 70–99)
Glucose-Capillary: 102 mg/dL — ABNORMAL HIGH (ref 70–99)

## 2021-07-13 LAB — C-REACTIVE PROTEIN: CRP: 0.6 mg/dL (ref <1.0)

## 2021-07-13 LAB — VDRL, CSF: VDRL Quant, CSF: NONREACTIVE

## 2021-07-13 LAB — BRAIN NATRIURETIC PEPTIDE: B Natriuretic Peptide: 30.5 pg/mL (ref 0.0–100.0)

## 2021-07-13 LAB — LYME DISEASE SEROLOGY W/REFLEX: Lyme Total Antibody EIA: NEGATIVE

## 2021-07-13 NOTE — Progress Notes (Signed)
NEUROLOGY CONSULTATION PROGRESS NOTE   Date of service: July 13, 2021 Patient Name: Jason [name] MRN:  [id number] DOB:  10-08-1956  Brief HPI  Jason [name] is a 65 y.o. male with PMH significant for Bell's palsy, diabetes, hypertension who was seen by her team for headache/drowsiness/lethargy/T-max of 100 and no meningismus and no leukocytosis.  He was initially discharged home but was brought back in by family with same day for persistent somnolence.  Work-up with MRI brain without contrast with no acute abnormalities.  U tox positive for THC.  He was placed on empiric antibiotics.  Routine EEG with no seizures or epileptiform discharges.  Noted to have low normal vitamin B12 levels and so was started on B12 supplement.   Interval Hx   Fluoro LP with WBC count of 165, protein of 81 with normal glucose. CSF cultures with no growth so far but he was also on Antibiotics before the LP. Gram stain shows both PMN and mononuclear cells. Procalcitonin neg, CRP and ESR negative.  Lyme PCR and HSV is still pending.  He is non toxic, no headache, no complains with intact mentation and cognitiion and no focal deficit.  Vitals   Vitals:   07/12/21 2009 07/12/21 2350 07/13/21 0354 07/13/21 0739  BP: (!) 148/80 (!) 148/92 (!) 139/91 (!) 142/80  Pulse: 74 60 69 65  Resp: '20 20 16 17  ' Temp: 98.7 F (37.1 C) 98.6 F (37 C) 98.7 F (37.1 C) 97.9 F (36.6 C)  TempSrc: Oral Oral Oral Oral  SpO2: 98% 100% 99% 100%  Weight:         Body mass index is 30.32 kg/m.  Physical Exam   General: Laying comfortably in bed; in no acute distress.  HENT: Normal oropharynx and mucosa. Normal external appearance of ears and nose.  Neck: Supple, no pain or tenderness  CV: No JVD. No peripheral edema.  Pulmonary: Symmetric Chest rise. Normal respiratory effort.  Abdomen: Soft to touch, non-tender.  Ext: No cyanosis, edema, or deformity  Skin: No rash. Normal palpation of skin.    Musculoskeletal: Normal digits and nails by inspection. No clubbing.   Neurologic Examination  Mental status/Cognition: Alert, oriented to self, place, month and year, good attention.  Speech/language: Fluent, comprehension intact, object naming intact, repetition intact.  Cranial nerves:   CN II Pupils equal and reactive to light, no VF deficits    CN III,IV,VI EOM intact, no gaze preference or deviation, no nystagmus    CN V normal sensation in V1, V2, and V3 segments bilaterally    CN VII no asymmetry, no nasolabial fold flattening    CN VIII normal hearing to speech    CN IX & X normal palatal elevation, no uvular deviation    CN XI 5/5 head turn and 5/5 shoulder shrug bilaterally    CN XII midline tongue protrusion    Motor:  Muscle bulk: normal, tone normal, pronator drift none tremor none Mvmt Root Nerve  Muscle Right Left Comments  SA C5/6 Ax Deltoid     EF C5/6 Mc Biceps 5 5   EE C6/7/8 Rad Triceps 5 5   WF C6/7 Med FCR     WE C7/8 PIN ECU     F Ab C8/T1 U ADM/FDI 5 5   HF L1/2/3 Fem Illopsoas 5 5   KE L2/3/4 Fem Quad     DF L4/5 D Peron Tib Ant 5 5   PF S1/2 Tibial Grc/Sol 5 5  Reflexes:  Right Left Comments  Pectoralis      Biceps (C5/6) 2 2   Brachioradialis (C5/6) 2 2    Triceps (C6/7) 2 2    Patellar (L3/4) 2 2    Achilles (S1)      Hoffman      Plantar     Jaw jerk    Sensation:  Light touch intact   Pin prick    Temperature    Vibration   Proprioception    Coordination/Complex Motor:  - Finger to Nose intact - Rapid alternating movement are normal - Gait: normal arm swing, narrow base width, able to toe walk and heel walk.  Labs   Basic Metabolic Panel:  Lab Results  Component Value Date   NA 136 07/13/2021   K 4.0 07/13/2021   CO2 25 07/13/2021   GLUCOSE 134 (H) 07/13/2021   BUN 13 07/13/2021   CREATININE 1.06 07/13/2021   CALCIUM 9.3 07/13/2021   GFRNONAA >60 07/13/2021   GFRAA >60 05/25/2011   HbA1c:  Lab Results  Component  Value Date   HGBA1C 8.3 (H) 06/17/2021   LDL:  Lab Results  Component Value Date   LDLCALC 79 07/10/2021   Urine Drug Screen:     Component Value Date/Time   LABOPIA NONE DETECTED 07/10/2021 0345   COCAINSCRNUR NONE DETECTED 07/10/2021 0345   COCAINSCRNUR NEGATIVE 05/24/2011 2012   LABBENZ NONE DETECTED 07/10/2021 0345   LABBENZ NEGATIVE 05/24/2011 2012   AMPHETMU NONE DETECTED 07/10/2021 0345   THCU POSITIVE (A) 07/10/2021 0345   LABBARB NONE DETECTED 07/10/2021 0345    Alcohol Level     Component Value Date/Time   ETH <10 07/10/2021 0143   No results found for: PHENYTOIN, ZONISAMIDE, LAMOTRIGINE, LEVETIRACETA No results found for: PHENYTOIN, PHENOBARB, VALPROATE, CBMZ  Imaging and Diagnostic studies   MRI brain without contrast: No acute abnormalities.  Vitamin B12 levels: 219.  LP with CSF cell count of 165. CSF protein of 81. Normal CSF glucose. CSF gram stain and culture with no organisms seen yet.  Noted to have both PMN and mononuclear cells. HSV 1 and 2 PCR in the CSF, VDRL CSF, Lyme serology and [facility] spotted fever antibody panel pending. Oligoclonal bands pending. CSF fungal cultures with preliminary results with no fungus isolated after 1 day.  Impression   Jason [name] is a 65 y.o. male with PMH significant as above who was evaluated by our team for headache, drowsiness, lethargic, T-max of 100.  Work-up so far with MRI brain without contrast, routine EEG with no acute abnormalities.  LP concerning for potential meningitis with pleocytosis and elevated protein.  He was on antibiotics before LP was obtained which makes it a little difficult to interpret.  There is also most consistent with a viral meningitis given he had normal glucose level, procalcitonin was negative, CRP was borderline, ESR is normal.  However, his CSF Gram stain does demonstrate both PMN and mononuclear cells.  The presence of PMN is somewhat unusual for a viral  meningitis.  Recommendations  -Continue meningitis dose ceftriaxone and acyclovir. - Follow-up on HSV 1 and 2 PCR in CSF and rest of the pending CSF studies. - Will recommend reaching out to infectious diseases team to get their input on CSF interpretation especially with the noted PMN's on gram stain. -Outpatient sleep study. ______________________________________________________________________   Thank you for the opportunity to take part in the care of this patient. If you have any further questions, please contact the  neurology consultation attending.  Signed,  [place] Pager Number [phone]

## 2021-07-13 NOTE — Progress Notes (Signed)
PROGRESS NOTE                                                                                                                                                                                                             Patient Demographics:    Jason [name], is a 65 y.o. male, DOB - 1956-02-08, [id number]  Outpatient Primary MD for the patient is [name], NP    LOS - 3  Admit date - 07/10/2021    Chief Complaint  Patient presents with   Altered Mental Status   Headache       Brief Narrative (HPI from H&P)  - Jason [name] is a 65 y.o. male with medical history significant of DM and HTN presenting with AMS after prior ER visit earlier in the day for the same.  He has been complaining of an intermittent headache for about a week, he was at times acting confused with mumbling speech.  He was brought to the ER where he was seen by neurology, bedside LP attempt failed.  He was placed on empiric coverage for meningitis and admitted for further work-up including fluoroscopy guided LP.   Subjective:   Patient in bed, appears comfortable, denies any headache, no fever, no chest pain or pressure, no shortness of breath , no abdominal pain. No new focal weakness.   Assessment  & Plan :     AMS with Intermittent Headaches - per Neuro he underwent fluoroscopy guided LP on 07/12/2021, CSF consistent with viral encephalitis/meningitis, no meningeal signs at this time, continue empiric IV acyclovir along with Rocephin, await Lyme and RMSF serum serology and HSV serology in CSF, discussed in detail with neurology and ID Dr. Baxter Flattery on 07/12/2021 including CSF cell count and gram stain of CSF findings.  2. THC use - counseled to quit  3. Low B12, replace SQ here.  4. Dyslipidemia.  On statin.    5. Obesity: BMI 30, follow with PCP  6. 1/2 positive blood culture with gram-positive cocci likely contaminant.  Afebrile without  leukocytosis, stable procalcitonin and CRP.  7. DM type II.  Sliding scale for now.  Lab Results  Component Value Date   HGBA1C 8.3 (H) 06/17/2021   CBG (last 3)  Recent Labs    07/12/21 1626 07/12/21 2052 07/13/21 0654  GLUCAP 192* 142* 158*  Condition -  Guarded  Family Communication  :  wife bedside 07/11/21, 07/12/21  Code Status :  Full  Consults  :  Neuro  PUD Prophylaxis :    Procedures  :     LP 07/12/21  CT Head, CTA Head and Neck - Non acute  MRI Head - non acute      Disposition Plan  :    Status is: Inpatient  Remains inpatient appropriate because:IV treatments appropriate due to intensity of illness or inability to take PO  Dispo:  Patient From: Home  Planned Disposition: Home  Medically stable for discharge: No   DVT Prophylaxis  :    Place and maintain sequential compression device Start: 07/11/21 1821 SCDs Start: 07/10/21 0458    Lab Results  Component Value Date   PLT 149 (L) 07/13/2021    Diet :  Diet Order             Diet heart healthy/carb modified Room service appropriate? Yes; Fluid consistency: Thin  Diet effective now                    Inpatient Medications  Scheduled Meds:  cyanocobalamin  1,000 mcg Intramuscular Daily   diltiazem  120 mg Oral Daily   feeding supplement  237 mL Oral BID BM   insulin aspart  0-15 Units Subcutaneous TID WC   insulin aspart  0-5 Units Subcutaneous QHS   rosuvastatin  5 mg Oral QHS   [START ON 07/16/2021] vitamin B-12  2,000 mcg Oral Daily   Continuous Infusions:  sodium chloride 50 mL/hr at 07/13/21 0524   acyclovir 1,000 mg (07/13/21 0612)   cefTRIAXone (ROCEPHIN)  IV Stopped (07/12/21 2158)   PRN Meds:.  Antibiotics  :    Anti-infectives (From admission, onward)    Start     Dose/Rate Route Frequency Ordered Stop   07/11/21 1100  vancomycin (VANCOCIN) IVPB 1000 mg/200 mL premix  Status:  Discontinued        1,000 mg 200 mL/hr over 60 Minutes Intravenous  Every 12 hours 07/11/21 1009 07/12/21 1548   07/10/21 2200  vancomycin (VANCOREADY) IVPB 1250 mg/250 mL  Status:  Discontinued        1,250 mg 166.7 mL/hr over 90 Minutes Intravenous Every 12 hours 07/10/21 0501 07/11/21 1009   07/10/21 0700  ampicillin (OMNIPEN) 2 g in sodium chloride 0.9 % 100 mL IVPB  Status:  Discontinued        2 g 300 mL/hr over 20 Minutes Intravenous Every 4 hours 07/10/21 0500 07/13/21 0716   07/10/21 0600  acyclovir (ZOVIRAX) 1,000 mg in dextrose 5 % 150 mL IVPB        1,000 mg 170 mL/hr over 60 Minutes Intravenous Every 8 hours 07/10/21 0504     07/10/21 0515  vancomycin (VANCOREADY) IVPB 2000 mg/400 mL        2,000 mg 200 mL/hr over 120 Minutes Intravenous  Once 07/10/21 0500 07/10/21 1111   07/10/21 0515  cefTRIAXone (ROCEPHIN) 2 g in sodium chloride 0.9 % 100 mL IVPB        2 g 200 mL/hr over 30 Minutes Intravenous Every 12 hours 07/10/21 0500          Time Spent in minutes  30   Lala Lund M.D on 07/13/2021 at 9:31 AM  To page go to [email or web address]   [facility] -  Office  [phone]    See all Orders from today for further details  Objective:   Vitals:   07/12/21 2009 07/12/21 2350 07/13/21 0354 07/13/21 0739  BP: (!) 148/80 (!) 148/92 (!) 139/91 (!) 142/80  Pulse: 74 60 69 65  Resp: '20 20 16 17  '$ Temp: 98.7 F (37.1 C) 98.6 F (37 C) 98.7 F (37.1 C) 97.9 F (36.6 C)  TempSrc: Oral Oral Oral Oral  SpO2: 98% 100% 99% 100%  Weight:        Wt Readings from Last 3 Encounters:  07/10/21 109.3 kg  06/17/21 109.3 kg  03/04/21 114 kg     Intake/Output Summary (Last 24 hours) at 07/13/2021 0931 Last data filed at 07/13/2021 0524 Gross per 24 hour  Intake 1642.37 ml  Output --  Net 1642.37 ml     Physical Exam  Awake Alert, No new F.N deficits, Supple neck, negative Kernig's and Brudzinski sign [place].AT,PERRAL Supple Neck,No JVD, No cervical lymphadenopathy appriciated.  Symmetrical Chest wall movement, Good air movement  bilaterally, CTAB RRR,No Gallops, Rubs or new Murmurs, No Parasternal Heave +ve B.Sounds, Abd Soft, No tenderness, No organomegaly appriciated, No rebound - guarding or rigidity. No Cyanosis, Clubbing or edema, No new Rash or bruise    Data Review:    CBC Recent Labs  Lab 07/09/21 1922 07/09/21 1926 07/10/21 0349 07/10/21 0433 07/11/21 0431 07/12/21 0224 07/13/21 0325  WBC 8.9  --  9.9  --  6.7 6.1 6.0  HGB 15.3   < > 14.7 14.3 13.7 14.4 14.0  HCT 46.5   < > 44.3 42.0 41.2 42.6 42.5  PLT 192  --  189  --  160 159 149*  MCV 97.9  --  97.6  --  97.6 96.4 96.8  MCH 32.2  --  32.4  --  32.5 32.6 31.9  MCHC 32.9  --  33.2  --  33.3 33.8 32.9  RDW 13.7  --  13.9  --  14.1 13.4 13.4  LYMPHSABS 5.3*  --  1.2  --  3.2 2.2 2.3  MONOABS 1.0  --  0.3  --  0.5 0.5 0.5  EOSABS 0.4  --  0.0  --  0.2 0.2 0.3  BASOSABS 0.2*  --  0.0  --  0.0 0.0 0.0   < > = values in this interval not displayed.    Recent Labs  Lab 07/09/21 1922 07/09/21 1926 07/10/21 0349 07/10/21 0412 07/10/21 0433 07/10/21 0622 07/10/21 1700 07/11/21 0132 07/12/21 0224 07/13/21 0325  NA 139 141  --   --  138  --  139 137 135 136  K 4.1 4.0  --   --  5.1  --  4.1 4.1 4.5 4.0  CL 104 107  --   --   --   --  106 104 102 102  CO2 23  --   --   --   --   --  '25 24 24 25  '$ GLUCOSE 115* 113*  --   --   --   --  105* 108* 144* 134*  BUN 13 15  --   --   --   --  '17 16 15 13  '$ CREATININE 1.19 1.10  --   --   --   --  1.35* 1.14 1.11 1.06  CALCIUM 9.8  --   --   --   --   --  9.3 9.1 9.5 9.3  AST 29  --   --   --   --   --   --  21  22 33  ALT 30  --   --   --   --   --   --  25 25 39  ALKPHOS 62  --   --   --   --   --   --  56 55 51  BILITOT 0.6  --   --   --   --   --   --  0.5 0.8 0.4  ALBUMIN 4.0  --   --   --   --   --   --  3.3* 3.3* 3.1*  MG  --   --  2.1  --   --   --   --  2.2 2.1 2.1  CRP  --   --  <0.5  --   --   --   --  1.0* 0.5 0.6  PROCALCITON  --   --  <0.10  --   --   --   --  <0.10 <0.10  --    INR 1.1  --   --   --   --   --   --   --   --   --   TSH  --   --  2.119  --   --   --   --   --   --   --   AMMONIA  --   --   --  22  --   --   --   --   --   --   BNP  --   --   --   --   --  21.1  --  52.7 54.3 30.5    ------------------------------------------------------------------------------------------------------------------ No results for input(s): CHOL, HDL, LDLCALC, TRIG, CHOLHDL, LDLDIRECT in the last 72 hours.   Lab Results  Component Value Date   HGBA1C 8.3 (H) 06/17/2021   ------------------------------------------------------------------------------------------------------------------ No results for input(s): TSH, T4TOTAL, T3FREE, THYROIDAB in the last 72 hours.  Invalid input(s): FREET3   Cardiac Enzymes No results for input(s): CKMB, TROPONINI, MYOGLOBIN in the last 168 hours.  Invalid input(s): CK ------------------------------------------------------------------------------------------------------------------    Component Value Date/Time   BNP 30.5 07/13/2021 0325   Radiology Reports CT Angio Head W or Wo Contrast  Result Date: 07/10/2021 CLINICAL DATA:  65 year old male. Persistent headache and confusion. Acute stroke suspected. EXAM: CT HEAD WITHOUT CONTRAST CTA HEAD AND NECK. TECHNIQUE: Contiguous axial images were obtained from the base of the skull through the vertex without intravenous contrast. Multi detector CTA of the head and neck during bolus administration of IV contrast. Multiplanar reformatted images, thick MIP images provided. COMPARISON:  Brain MRI 07/09/2021 at 2141 hours.  Head CT yesterday. FINDINGS: Brain: Mild motion artifact now. Stable non contrast CT appearance of the brain. No midline shift, ventriculomegaly, mass effect, evidence of mass lesion, intracranial hemorrhage or evidence of cortically based acute infarction. Vascular: No suspicious intracranial vascular hyperdensity. Skull: No acute osseous abnormality identified.  Sinuses/Orbits: Visualized paranasal sinuses and mastoids are stable and well aerated. Other: Visualized orbits and scalp soft tissues are within normal limits. ASPECTS [facility] Stroke Program Early CT Score) Total score (0-10 with 10 being normal): 10 CTA NECK Skeleton: Absent and carious dentition. Mild for age cervical spine degeneration. No acute osseous abnormality identified. Upper chest: Atelectasis. Other neck: Negative. Aortic arch: 3 vessel arch configuration.  No arch atherosclerosis. Right carotid system: Negative. Left carotid system: Negative. Vertebral arteries: Normal proximal right subclavian artery and right vertebral artery origin. Right vertebral artery  appears patent and normal to the skull base. Normal proximal left subclavian artery and left vertebral artery origin. Left vertebral artery appears mildly dominant, mildly tortuous, patent and otherwise normal to the skull base. CTA HEAD Posterior circulation: Slightly dominant left vertebral artery V4 segment. Normal vertebral arteries to the vertebrobasilar junction. Normal PICA origins. Patent basilar artery without stenosis. Patent SCA and PCA origins. Both posterior communicating arteries are present. Bilateral PCA branches are within normal limits. Anterior circulation: Both ICA siphons are patent. No siphon plaque or stenosis. Normal ophthalmic and posterior communicating artery origins. Patent carotid termini, MCA and ACA origins. Left ACA A1 appears mildly dominant. Anterior communicating artery and bilateral ACA branches are within normal limits. Left MCA M1 segment and bifurcation are patent without stenosis. Left MCA branches are within normal limits. Right MCA M1 segment and trifurcation are within normal limits. Right MCA branches are within normal limits. Venous sinuses: Patent. Anatomic variants: Mildly dominant left vertebral artery, left ACA A1 segment. Review of the MIP images confirms the above findings IMPRESSION: 1. Negative  CTA Head and Neck. No large vessel occlusion, no atherosclerosis or stenosis identified. 2. Stable and negative for age noncontrast CT appearance of the brain. 3. Poor dentition. Electronically Signed   By: Genevie Ann M.D.   On: 07/10/2021 04:44   MR BRAIN WO CONTRAST  Result Date: 07/09/2021 CLINICAL DATA:  Neuro deficit, acute stroke suspected. EXAM: MRI HEAD WITHOUT CONTRAST TECHNIQUE: Multiplanar, multiecho pulse sequences of the brain and surrounding structures were obtained without intravenous contrast. COMPARISON:  Same day head CT. FINDINGS: Motion limited evaluation.  Within limitation: Brain: No acute infarction, hemorrhage, hydrocephalus, extra-axial collection or mass lesion. Mild scattered T2 hyperintensities in the white matter, nonspecific but most likely related to mild chronic microvascular ischemic disease. Vascular: Major arterial flow voids are maintained at the skull base. Skull and upper cervical spine: Normal marrow signal. Sinuses/Orbits: Small left maxillary sinus retention cyst. Trace ethmoid air cell and frontal sinus mucosal thickening. No acute orbital findings. Other: No sizable mastoid effusions. IMPRESSION: 1. Motion limited exam without evidence of acute intracranial abnormality. Specifically, no acute infarct. 2. Mild chronic microvascular ischemic disease. Electronically Signed   By: Margaretha Sheffield MD   On: 07/09/2021 20:13   DG Chest Portable 1 View  Result Date: 07/10/2021 CLINICAL DATA:  65 year old male with altered mental status. EXAM: PORTABLE CHEST 1 VIEW COMPARISON:  Chest radiograph 05/24/2011. FINDINGS: Portable AP supine view at 0351 hours. Mildly rotated to the left. Lung volumes and mediastinal contours are stable and within normal limits. Visualized tracheal air column is within normal limits. Allowing for portable technique the lungs are clear. No pneumothorax or pleural effusion. No acute osseous abnormality identified. IMPRESSION: Negative portable chest.  Electronically Signed   By: Genevie Ann M.D.   On: 07/10/2021 04:45   EEG adult  Result Date: 07/10/2021 Lora Havens, MD     07/10/2021  2:55 PM Patient Name: MACAI MULROY MRN: [id number] Epilepsy Attending: Lora Havens Referring Physician/Provider: Dr Kerney Elbe Date: 07/10/2021 Duration: 25.22 mins Patient history: 65yo M with new onset speech difficulty and confusion. EEG to evaluate for seizure Level of alertness: Awake, asleep AEDs during EEG study: None Technical aspects: This EEG study was done with scalp electrodes positioned according to the 10-20 International system of electrode placement. Electrical activity was acquired at a sampling rate of '500Hz'$  and reviewed with a high frequency filter of '70Hz'$  and a low frequency filter of '1Hz'$ . EEG data  were recorded continuously and digitally stored. Description: The posterior dominant rhythm consists of 8-'9Hz'$  activity of moderate voltage (25-35 uV) seen predominantly in posterior head regions, symmetric and reactive to eye opening and eye closing. Sleep was characterized by vertex waves, sleep spindles (12 to 14 Hz), maximal frontocentral region. Physiologic photic driving was seen during photic stimulation.  Hyperventilation was not performed.   IMPRESSION: This study is within normal limits. No seizures or epileptiform discharges were seen throughout the recording. Lora Havens   CT HEAD CODE STROKE WO CONTRAST  Result Date: 07/09/2021 CLINICAL DATA:  Code stroke. Neuro deficit, acute, stroke suspected. Additional history provided: Headache and slurred speech. EXAM: CT HEAD WITHOUT CONTRAST TECHNIQUE: Contiguous axial images were obtained from the base of the skull through the vertex without intravenous contrast. COMPARISON:  Head CT 12/11/2007.  Brain MRI 03/08/2004. FINDINGS: Brain: The examination is significantly motion degraded at the level of the maxillofacial structures and skull base. Cerebral volume is normal for age. There is no  acute intracranial hemorrhage. No demarcated cortical infarct. No extra-axial fluid collection. No evidence of an intracranial mass. No midline shift. Vascular: No hyperdense vessel. Skull: No calvarial fracture or focal suspicious calvarial lesion. Sinuses/Orbits: Visualized orbits show no acute finding. Left maxillary sinus mucous retention cyst. Trace mucosal thickening within the bilateral ethmoid air cells. ASPECTS [facility] Stroke Program Early CT Score) - Ganglionic level infarction (caudate, lentiform nuclei, internal capsule, insula, M1-M3 cortex): 7 - Supraganglionic infarction (M4-M6 cortex): 3 Total score (0-10 with 10 being normal): 10 These results were communicated to Dr. Rory Percy at 7:40 pmon 7/29/2022by text page via the [facility] messaging system. IMPRESSION: Significantly motion degraded examination at the level of the maxillofacial structures and skull base. No evidence of acute intracranial abnormality Paranasal sinus disease, as described. Electronically Signed   By: Kellie Simmering DO   On: 07/09/2021 19:40   DG FL GUIDED LUMBAR PUNCTURE  Result Date: 07/12/2021 CLINICAL DATA:  Patient admitted for headache, drowsiness, lethargy presents to radiology today for image guided lumbar puncture. EXAM: DIAGNOSTIC LUMBAR PUNCTURE UNDER FLUOROSCOPIC GUIDANCE COMPARISON:  None FLUOROSCOPY TIME:  Fluoroscopy Time: 42 seconds Radiation Exposure Index (if provided by the fluoroscopic device): 9.4 mGy Number of Acquired Spot Images: 1 PROCEDURE: Informed consent was obtained from the patient prior to the procedure, including potential complications of headache, allergy, and pain. With the patient prone, the lower back was prepped with Betadine. 1% Lidocaine was used for local anesthesia. Lumbar puncture was performed at the L4-L5 level using a 20 gauge needle with return of clear CSF with an opening pressure of 22 cm water. 12 ml of CSF were obtained for laboratory studies. The patient tolerated the procedure well  and there were no apparent complications. The needle was removed and the site covered with a band aid. IMPRESSION: Technically successful image guided L4-L5 lumbar puncture yielding 12 mL of clear CSF. Read by: Soyla Dryer, NP Study was performed by advanced practice provider as outlined above under my supervision, interpreted by me. Electronically Signed   By: Zetta Bills M.D.   On: 07/12/2021 10:09

## 2021-07-13 NOTE — Care Management Important Message (Signed)
Important Message  Patient Details  Name: Jason [name] MRN: [id number] Date of Birth: [birth year]   Medicare Important Message Given:  Yes     [name] 07/13/2021, 2:44 PM

## 2021-07-14 DIAGNOSIS — I10 Essential (primary) hypertension: Secondary | ICD-10-CM | POA: Diagnosis not present

## 2021-07-14 LAB — GLUCOSE, CAPILLARY
Glucose-Capillary: 155 mg/dL — ABNORMAL HIGH (ref 70–99)
Glucose-Capillary: 213 mg/dL — ABNORMAL HIGH (ref 70–99)
Glucose-Capillary: 150 mg/dL — ABNORMAL HIGH (ref 70–99)
Glucose-Capillary: 183 mg/dL — ABNORMAL HIGH (ref 70–99)

## 2021-07-14 LAB — PATHOLOGIST SMEAR REVIEW

## 2021-07-14 LAB — BASIC METABOLIC PANEL
Anion gap: 7 (ref 5–15)
BUN: 13 mg/dL (ref 8–23)
CO2: 25 mmol/L (ref 22–32)
Calcium: 9.1 mg/dL (ref 8.9–10.3)
Chloride: 105 mmol/L (ref 98–111)
Creatinine, Ser: 1.06 mg/dL (ref 0.61–1.24)
GFR, Estimated: >60 mL/min (ref >60)
Glucose, Bld: 125 mg/dL — ABNORMAL HIGH (ref 70–99)
Potassium: 4.4 mmol/L (ref 3.5–5.1)
Sodium: 137 mmol/L (ref 135–145)

## 2021-07-14 LAB — HSV 1/2 PCR, CSF
HSV-1 DNA: NEGATIVE
HSV-2 DNA: NEGATIVE

## 2021-07-14 LAB — OLIGOCLONAL BANDS, CSF + SERM

## 2021-07-14 NOTE — Progress Notes (Signed)
PROGRESS NOTE                                                                                                                                                                                                             Patient Demographics:    Jason [name], is a 65 y.o. male, DOB - Oct 16, 1956, [id number]  Outpatient Primary MD for the patient is [name], NP    LOS - 4  Admit date - 07/10/2021    Chief Complaint  Patient presents with   Altered Mental Status   Headache       Brief Narrative (HPI from H&P)  - MONTECO YAZELL is a 65 y.o. male with medical history significant of DM and HTN presenting with AMS after prior ER visit earlier in the day for the same.  He has been complaining of an intermittent headache for about a week, he was at times acting confused with mumbling speech.  He was brought to the ER where he was seen by neurology, bedside LP attempt failed.  He was placed on empiric coverage for meningitis and admitted for further work-up including fluoroscopy guided LP.   Subjective:   Patient in bed, appears comfortable, denies any headache, no fever, no chest pain or pressure, no shortness of breath , no abdominal pain. No new focal weakness.    Assessment  & Plan :     AMS with Intermittent Headaches - per Neuro he underwent fluoroscopy guided LP on 07/12/2021, CSF consistent with viral encephalitis/meningitis, no meningeal signs at this time, continue empiric IV acyclovir along with Rocephin, negative Lyme and RMSF serum serology pending HSV serology in CSF, discussed in detail with neurology and ID Dr. Baxter Flattery on 07/12/2021 including CSF cell count and gram stain of CSF findings, IV Acylovir only for now.  2. THC use - counseled to quit  3. Low B12, replace SQ here >> PO.  4. Dyslipidemia.  On statin.    5. Obesity: BMI 30, follow with PCP  6. 1/2 positive blood culture with gram-positive  cocci likely contaminant.  Afebrile without leukocytosis, stable procalcitonin and CRP.  7. DM type II.  Sliding scale for now.  Lab Results  Component Value Date   HGBA1C 8.3 (H) 06/17/2021   CBG (last 3)  Recent Labs    07/13/21 1607 07/13/21 2122 07/14/21  Strum          Condition -  Guarded  Family Communication  :  wife bedside 07/11/21, 07/12/21  Code Status :  Full  Consults  :  Neuro  PUD Prophylaxis :    Procedures  :     LP 07/12/21  CT Head, CTA Head and Neck - Non acute  MRI Head - non acute      Disposition Plan  :    Status is: Inpatient  Remains inpatient appropriate because:IV treatments appropriate due to intensity of illness or inability to take PO  Dispo:  Patient From: Home  Planned Disposition: Home  Medically stable for discharge: No   DVT Prophylaxis  :    Place and maintain sequential compression device Start: 07/11/21 1821 SCDs Start: 07/10/21 0458    Lab Results  Component Value Date   PLT 149 (L) 07/13/2021    Diet :  Diet Order             Diet heart healthy/carb modified Room service appropriate? Yes; Fluid consistency: Thin  Diet effective now                    Inpatient Medications  Scheduled Meds:  cyanocobalamin  1,000 mcg Intramuscular Daily   diltiazem  120 mg Oral Daily   feeding supplement  237 mL Oral BID BM   insulin aspart  0-15 Units Subcutaneous TID WC   insulin aspart  0-5 Units Subcutaneous QHS   rosuvastatin  5 mg Oral QHS   [START ON 07/16/2021] vitamin B-12  2,000 mcg Oral Daily   Continuous Infusions:  sodium chloride 50 mL/hr at 07/13/21 2148   acyclovir 1,000 mg (07/14/21 0520)   PRN Meds:.  Antibiotics  :    Anti-infectives (From admission, onward)    Start     Dose/Rate Route Frequency Ordered Stop   07/11/21 1100  vancomycin (VANCOCIN) IVPB 1000 mg/200 mL premix  Status:  Discontinued        1,000 mg 200 mL/hr over 60 Minutes Intravenous Every 12 hours  07/11/21 1009 07/12/21 1548   07/10/21 2200  vancomycin (VANCOREADY) IVPB 1250 mg/250 mL  Status:  Discontinued        1,250 mg 166.7 mL/hr over 90 Minutes Intravenous Every 12 hours 07/10/21 0501 07/11/21 1009   07/10/21 0700  ampicillin (OMNIPEN) 2 g in sodium chloride 0.9 % 100 mL IVPB  Status:  Discontinued        2 g 300 mL/hr over 20 Minutes Intravenous Every 4 hours 07/10/21 0500 07/13/21 0716   07/10/21 0600  acyclovir (ZOVIRAX) 1,000 mg in dextrose 5 % 150 mL IVPB        1,000 mg 170 mL/hr over 60 Minutes Intravenous Every 8 hours 07/10/21 0504     07/10/21 0515  vancomycin (VANCOREADY) IVPB 2000 mg/400 mL        2,000 mg 200 mL/hr over 120 Minutes Intravenous  Once 07/10/21 0500 07/10/21 1111   07/10/21 0515  cefTRIAXone (ROCEPHIN) 2 g in sodium chloride 0.9 % 100 mL IVPB  Status:  Discontinued        2 g 200 mL/hr over 30 Minutes Intravenous Every 12 hours 07/10/21 0500 07/14/21 0656        Time Spent in minutes  30   Lala Lund M.D on 07/14/2021 at 9:32 AM  To page go to [email or web address]   [facility] -  Office  [phone]  See all Orders from today for further details    Objective:   Vitals:   07/13/21 2100 07/13/21 2343 07/14/21 0308 07/14/21 0737  BP: (!) 149/83 137/82 (!) 148/72 (!) 146/94  Pulse: 72 63 63 69  Resp:  '18 17 18  '$ Temp:  98.3 F (36.8 C) 98.2 F (36.8 C) 97.7 F (36.5 C)  TempSrc:  Oral  Oral  SpO2:  96% 99% 98%  Weight:        Wt Readings from Last 3 Encounters:  07/10/21 109.3 kg  06/17/21 109.3 kg  03/04/21 114 kg     Intake/Output Summary (Last 24 hours) at 07/14/2021 0932 Last data filed at 07/14/2021 0547 Gross per 24 hour  Intake 1145.56 ml  Output --  Net 1145.56 ml     Physical Exam  Awake Alert, No new F.N deficits, Normal affect [place].AT,PERRAL Supple Neck,No JVD, No cervical lymphadenopathy appriciated.  Symmetrical Chest wall movement, Good air movement bilaterally, CTAB RRR,No Gallops, Rubs or new  Murmurs, No Parasternal Heave +ve B.Sounds, Abd Soft, No tenderness, No organomegaly appriciated, No rebound - guarding or rigidity. No Cyanosis, Clubbing or edema, No new Rash or bruise     Data Review:    CBC Recent Labs  Lab 07/09/21 1922 07/09/21 1926 07/10/21 0349 07/10/21 0433 07/11/21 0431 07/12/21 0224 07/13/21 0325  WBC 8.9  --  9.9  --  6.7 6.1 6.0  HGB 15.3   < > 14.7 14.3 13.7 14.4 14.0  HCT 46.5   < > 44.3 42.0 41.2 42.6 42.5  PLT 192  --  189  --  160 159 149*  MCV 97.9  --  97.6  --  97.6 96.4 96.8  MCH 32.2  --  32.4  --  32.5 32.6 31.9  MCHC 32.9  --  33.2  --  33.3 33.8 32.9  RDW 13.7  --  13.9  --  14.1 13.4 13.4  LYMPHSABS 5.3*  --  1.2  --  3.2 2.2 2.3  MONOABS 1.0  --  0.3  --  0.5 0.5 0.5  EOSABS 0.4  --  0.0  --  0.2 0.2 0.3  BASOSABS 0.2*  --  0.0  --  0.0 0.0 0.0   < > = values in this interval not displayed.    Recent Labs  Lab 07/09/21 1922 07/09/21 1926 07/10/21 0349 07/10/21 0412 07/10/21 0433 07/10/21 0622 07/10/21 1700 07/11/21 0132 07/12/21 0224 07/13/21 0325 07/14/21 0202  NA 139   < >  --   --    < >  --  139 137 135 136 137  K 4.1   < >  --   --    < >  --  4.1 4.1 4.5 4.0 4.4  CL 104   < >  --   --   --   --  106 104 102 102 105  CO2 23  --   --   --   --   --  '25 24 24 25 25  '$ GLUCOSE 115*   < >  --   --   --   --  105* 108* 144* 134* 125*  BUN 13   < >  --   --   --   --  '17 16 15 13 13  '$ CREATININE 1.19   < >  --   --   --   --  1.35* 1.14 1.11 1.06 1.06  CALCIUM 9.8  --   --   --   --   --  9.3 9.1 9.5 9.3 9.1  AST 29  --   --   --   --   --   --  21 22 33  --   ALT 30  --   --   --   --   --   --  25 25 39  --   ALKPHOS 62  --   --   --   --   --   --  56 55 51  --   BILITOT 0.6  --   --   --   --   --   --  0.5 0.8 0.4  --   ALBUMIN 4.0  --   --   --   --   --   --  3.3* 3.3* 3.1*  --   MG  --   --  2.1  --   --   --   --  2.2 2.1 2.1  --   CRP  --   --  <0.5  --   --   --   --  1.0* 0.5 0.6  --   PROCALCITON  --    --  <0.10  --   --   --   --  <0.10 <0.10  --   --   INR 1.1  --   --   --   --   --   --   --   --   --   --   TSH  --   --  2.119  --   --   --   --   --   --   --   --   AMMONIA  --   --   --  22  --   --   --   --   --   --   --   BNP  --   --   --   --   --  21.1  --  52.7 54.3 30.5  --    < > = values in this interval not displayed.    ------------------------------------------------------------------------------------------------------------------ No results for input(s): CHOL, HDL, LDLCALC, TRIG, CHOLHDL, LDLDIRECT in the last 72 hours.   Lab Results  Component Value Date   HGBA1C 8.3 (H) 06/17/2021   ------------------------------------------------------------------------------------------------------------------ No results for input(s): TSH, T4TOTAL, T3FREE, THYROIDAB in the last 72 hours.  Invalid input(s): FREET3   Cardiac Enzymes No results for input(s): CKMB, TROPONINI, MYOGLOBIN in the last 168 hours.  Invalid input(s): CK ------------------------------------------------------------------------------------------------------------------    Component Value Date/Time   BNP 30.5 07/13/2021 0325   Radiology Reports CT Angio Head W or Wo Contrast  Result Date: 07/10/2021 CLINICAL DATA:  65 year old male. Persistent headache and confusion. Acute stroke suspected. EXAM: CT HEAD WITHOUT CONTRAST CTA HEAD AND NECK. TECHNIQUE: Contiguous axial images were obtained from the base of the skull through the vertex without intravenous contrast. Multi detector CTA of the head and neck during bolus administration of IV contrast. Multiplanar reformatted images, thick MIP images provided. COMPARISON:  Brain MRI 07/09/2021 at 2141 hours.  Head CT yesterday. FINDINGS: Brain: Mild motion artifact now. Stable non contrast CT appearance of the brain. No midline shift, ventriculomegaly, mass effect, evidence of mass lesion, intracranial hemorrhage or evidence of cortically based acute  infarction. Vascular: No suspicious intracranial vascular hyperdensity. Skull: No acute osseous abnormality identified. Sinuses/Orbits: Visualized paranasal sinuses and mastoids are stable and well aerated. Other: Visualized orbits and scalp soft tissues are within  normal limits. ASPECTS [facility] Stroke Program Early CT Score) Total score (0-10 with 10 being normal): 10 CTA NECK Skeleton: Absent and carious dentition. Mild for age cervical spine degeneration. No acute osseous abnormality identified. Upper chest: Atelectasis. Other neck: Negative. Aortic arch: 3 vessel arch configuration.  No arch atherosclerosis. Right carotid system: Negative. Left carotid system: Negative. Vertebral arteries: Normal proximal right subclavian artery and right vertebral artery origin. Right vertebral artery appears patent and normal to the skull base. Normal proximal left subclavian artery and left vertebral artery origin. Left vertebral artery appears mildly dominant, mildly tortuous, patent and otherwise normal to the skull base. CTA HEAD Posterior circulation: Slightly dominant left vertebral artery V4 segment. Normal vertebral arteries to the vertebrobasilar junction. Normal PICA origins. Patent basilar artery without stenosis. Patent SCA and PCA origins. Both posterior communicating arteries are present. Bilateral PCA branches are within normal limits. Anterior circulation: Both ICA siphons are patent. No siphon plaque or stenosis. Normal ophthalmic and posterior communicating artery origins. Patent carotid termini, MCA and ACA origins. Left ACA A1 appears mildly dominant. Anterior communicating artery and bilateral ACA branches are within normal limits. Left MCA M1 segment and bifurcation are patent without stenosis. Left MCA branches are within normal limits. Right MCA M1 segment and trifurcation are within normal limits. Right MCA branches are within normal limits. Venous sinuses: Patent. Anatomic variants: Mildly dominant  left vertebral artery, left ACA A1 segment. Review of the MIP images confirms the above findings IMPRESSION: 1. Negative CTA Head and Neck. No large vessel occlusion, no atherosclerosis or stenosis identified. 2. Stable and negative for age noncontrast CT appearance of the brain. 3. Poor dentition. Electronically Signed   By: Genevie Ann M.D.   On: 07/10/2021 04:44   MR BRAIN WO CONTRAST  Result Date: 07/09/2021 CLINICAL DATA:  Neuro deficit, acute stroke suspected. EXAM: MRI HEAD WITHOUT CONTRAST TECHNIQUE: Multiplanar, multiecho pulse sequences of the brain and surrounding structures were obtained without intravenous contrast. COMPARISON:  Same day head CT. FINDINGS: Motion limited evaluation.  Within limitation: Brain: No acute infarction, hemorrhage, hydrocephalus, extra-axial collection or mass lesion. Mild scattered T2 hyperintensities in the white matter, nonspecific but most likely related to mild chronic microvascular ischemic disease. Vascular: Major arterial flow voids are maintained at the skull base. Skull and upper cervical spine: Normal marrow signal. Sinuses/Orbits: Small left maxillary sinus retention cyst. Trace ethmoid air cell and frontal sinus mucosal thickening. No acute orbital findings. Other: No sizable mastoid effusions. IMPRESSION: 1. Motion limited exam without evidence of acute intracranial abnormality. Specifically, no acute infarct. 2. Mild chronic microvascular ischemic disease. Electronically Signed   By: Margaretha Sheffield MD   On: 07/09/2021 20:13   DG Chest Portable 1 View  Result Date: 07/10/2021 CLINICAL DATA:  65 year old male with altered mental status. EXAM: PORTABLE CHEST 1 VIEW COMPARISON:  Chest radiograph 05/24/2011. FINDINGS: Portable AP supine view at 0351 hours. Mildly rotated to the left. Lung volumes and mediastinal contours are stable and within normal limits. Visualized tracheal air column is within normal limits. Allowing for portable technique the lungs are  clear. No pneumothorax or pleural effusion. No acute osseous abnormality identified. IMPRESSION: Negative portable chest. Electronically Signed   By: Genevie Ann M.D.   On: 07/10/2021 04:45   EEG adult  Result Date: 07/10/2021 Lora Havens, MD     07/10/2021  2:55 PM Patient Name: SHERLEY VIVO MRN: [id number] Epilepsy Attending: Lora Havens Referring Physician/Provider: Dr Kerney Elbe Date: 07/10/2021 Duration: 25.22 mins  Patient history: 65yo M with new onset speech difficulty and confusion. EEG to evaluate for seizure Level of alertness: Awake, asleep AEDs during EEG study: None Technical aspects: This EEG study was done with scalp electrodes positioned according to the 10-20 International system of electrode placement. Electrical activity was acquired at a sampling rate of '500Hz'$  and reviewed with a high frequency filter of '70Hz'$  and a low frequency filter of '1Hz'$ . EEG data were recorded continuously and digitally stored. Description: The posterior dominant rhythm consists of 8-'9Hz'$  activity of moderate voltage (25-35 uV) seen predominantly in posterior head regions, symmetric and reactive to eye opening and eye closing. Sleep was characterized by vertex waves, sleep spindles (12 to 14 Hz), maximal frontocentral region. Physiologic photic driving was seen during photic stimulation.  Hyperventilation was not performed.   IMPRESSION: This study is within normal limits. No seizures or epileptiform discharges were seen throughout the recording. Lora Havens   CT HEAD CODE STROKE WO CONTRAST  Result Date: 07/09/2021 CLINICAL DATA:  Code stroke. Neuro deficit, acute, stroke suspected. Additional history provided: Headache and slurred speech. EXAM: CT HEAD WITHOUT CONTRAST TECHNIQUE: Contiguous axial images were obtained from the base of the skull through the vertex without intravenous contrast. COMPARISON:  Head CT 12/11/2007.  Brain MRI 03/08/2004. FINDINGS: Brain: The examination is significantly  motion degraded at the level of the maxillofacial structures and skull base. Cerebral volume is normal for age. There is no acute intracranial hemorrhage. No demarcated cortical infarct. No extra-axial fluid collection. No evidence of an intracranial mass. No midline shift. Vascular: No hyperdense vessel. Skull: No calvarial fracture or focal suspicious calvarial lesion. Sinuses/Orbits: Visualized orbits show no acute finding. Left maxillary sinus mucous retention cyst. Trace mucosal thickening within the bilateral ethmoid air cells. ASPECTS [facility] Stroke Program Early CT Score) - Ganglionic level infarction (caudate, lentiform nuclei, internal capsule, insula, M1-M3 cortex): 7 - Supraganglionic infarction (M4-M6 cortex): 3 Total score (0-10 with 10 being normal): 10 These results were communicated to Dr. Rory Percy at 7:40 pmon 7/29/2022by text page via the [facility] messaging system. IMPRESSION: Significantly motion degraded examination at the level of the maxillofacial structures and skull base. No evidence of acute intracranial abnormality Paranasal sinus disease, as described. Electronically Signed   By: Kellie Simmering DO   On: 07/09/2021 19:40   DG FL GUIDED LUMBAR PUNCTURE  Result Date: 07/12/2021 CLINICAL DATA:  Patient admitted for headache, drowsiness, lethargy presents to radiology today for image guided lumbar puncture. EXAM: DIAGNOSTIC LUMBAR PUNCTURE UNDER FLUOROSCOPIC GUIDANCE COMPARISON:  None FLUOROSCOPY TIME:  Fluoroscopy Time: 42 seconds Radiation Exposure Index (if provided by the fluoroscopic device): 9.4 mGy Number of Acquired Spot Images: 1 PROCEDURE: Informed consent was obtained from the patient prior to the procedure, including potential complications of headache, allergy, and pain. With the patient prone, the lower back was prepped with Betadine. 1% Lidocaine was used for local anesthesia. Lumbar puncture was performed at the L4-L5 level using a 20 gauge needle with return of clear CSF with an  opening pressure of 22 cm water. 12 ml of CSF were obtained for laboratory studies. The patient tolerated the procedure well and there were no apparent complications. The needle was removed and the site covered with a band aid. IMPRESSION: Technically successful image guided L4-L5 lumbar puncture yielding 12 mL of clear CSF. Read by: Soyla Dryer, NP Study was performed by advanced practice provider as outlined above under my supervision, interpreted by me. Electronically Signed   By: Jewel Baize.D.  On: 07/12/2021 10:09

## 2021-07-14 NOTE — Plan of Care (Signed)

## 2021-07-14 NOTE — Plan of Care (Signed)

## 2021-07-14 NOTE — Progress Notes (Signed)
Pharmacy Antibiotic Note  Jason [name] is a 65 y.o. male admitted on 07/10/2021 with  headache/confusion .  Pharmacy has been consulted for Vancomycin/Acyclovir dosing for r/o meningitis. WBC WNL. Scr improved to 1.14 from 1.35.    Abx have been streamlined to acyclovir only. Work up for meningitis has been neg so far. HSV PCR is still in process. Acyclovir can be dced if neg. Renal function has been stable. He is also on NS to decrease risk of acyclovir nephrotoxicity.   Plan:  Acyclovir 1000 mg IV q8h F/u HSV PCR  Weight: 109.3 kg (241 lb) (from 06/17/21)  Temp (24hrs), Avg:98 F (36.7 C), Min:97.7 F (36.5 C), Max:98.3 F (36.8 C)  Recent Labs  Lab 07/09/21 1922 07/09/21 1926 07/10/21 0349 07/10/21 1700 07/11/21 0132 07/11/21 0431 07/12/21 0224 07/13/21 0325 07/14/21 0202  WBC 8.9  --  9.9  --   --  6.7 6.1 6.0  --   CREATININE 1.19   < >  --  1.35* 1.14  --  1.11 1.06 1.06   < > = values in this interval not displayed.     Estimated Creatinine Clearance: 90 mL/min (by C-G formula based on SCr of 1.06 mg/dL).    Allergies  Allergen Reactions   Lisinopril     hyperkalemia   Reglan [Metoclopramide] Anxiety    Patient had bad reaction requiring ativan in addition to benadryl. He doesn't want to receive it any more    Vancomycin 7/30 >>8/1 Ampicillin 7/30 >>8/2 Ceftriaxone 7/30 >>8/3 Acyclovir 7/30 >>   7/30 Bcx: 1/4 staph epi- likely contaminant 8/1 CSF>>ngtd 7/30 urine>>20k group b strep 8/1 lyme>>neg 8/1 RMSF>>neg 8/1 CSF HSV 1/2>>IP 8/1 VDRL>neg  Onnie Boer, PharmD, BCIDP, AAHIVP, CPP Infectious Disease Pharmacist 07/14/2021 10:14 AM

## 2021-07-15 ENCOUNTER — Telehealth: Payer: Self-pay | Admitting: Pharmacist

## 2021-07-15 DIAGNOSIS — I10 Essential (primary) hypertension: Secondary | ICD-10-CM | POA: Diagnosis not present

## 2021-07-15 LAB — BASIC METABOLIC PANEL
Anion gap: 10 (ref 5–15)
BUN: 14 mg/dL (ref 8–23)
CO2: 24 mmol/L (ref 22–32)
Calcium: 9.6 mg/dL (ref 8.9–10.3)
Chloride: 102 mmol/L (ref 98–111)
Creatinine, Ser: 1.02 mg/dL (ref 0.61–1.24)
GFR, Estimated: >60 mL/min (ref >60)
Glucose, Bld: 137 mg/dL — ABNORMAL HIGH (ref 70–99)
Potassium: 4 mmol/L (ref 3.5–5.1)
Sodium: 136 mmol/L (ref 135–145)

## 2021-07-15 LAB — CSF CULTURE W GRAM STAIN: Culture: NO GROWTH

## 2021-07-15 LAB — CULTURE, BLOOD (ROUTINE X 2)
Culture: NO GROWTH
Special Requests: ADEQUATE

## 2021-07-15 LAB — GLUCOSE, CAPILLARY
Glucose-Capillary: 170 mg/dL — ABNORMAL HIGH (ref 70–99)
Glucose-Capillary: 188 mg/dL — ABNORMAL HIGH (ref 70–99)

## 2021-07-15 MED ORDER — VITAMIN B-12 1000 MCG PO TABS: 1000.0000 ug | ORAL_TABLET | Freq: Every day | ORAL | 0 refills | Status: AC

## 2021-07-15 NOTE — Discharge Summary (Signed)
Jason [name] BMS:[id number] DOB: August 27, 1956 DOA: 07/10/2021  PCP: [name], Jason [name]  Admit date: 07/10/2021  Discharge date: 07/15/2021  Admitted From: Home  Disposition:  Home   Recommendations for Outpatient Follow-up:   Follow up with PCP in 1-2 weeks  PCP Please obtain BMP/CBC, 2 view CXR in 1week,  (see Discharge instructions)   PCP Please follow up on the following pending results: Monitor outpatient B12 levels, monitor for any signs of meningitis reoccurrence.   Home Health: None   Equipment/Devices: None  Consultations: Neuro, ID over phone Discharge Condition: Stable   CODE STATUS: Full   Diet Recommendation: Heart Healthy - Low Carb    Chief Complaint  Patient presents with   Altered Mental Status   Headache     Brief history of present illness from the day of admission and additional interim summary    Jason [name] is a 65 y.o. male with medical history significant of DM and HTN presenting with AMS after prior ER visit earlier in the day for the same.  He has been complaining of an intermittent headache for about a week, he was at times acting confused with mumbling speech.  He was brought to the ER where he was seen by neurology, bedside LP attempt failed.  He was placed on empiric coverage for meningitis and admitted for further work-up including fluoroscopy guided LP.                                                                  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  Before getting negative HSV serology he was treated with IV acyclovir for 5 days with IV fluids.   2. THC use - counseled to quit   3. Low B12, replace SQ here >> PO.  PCP to monitor levels.   4. Dyslipidemia.  On statin.     5. Obesity: BMI 30, follow with PCP   6. 1/2 positive blood culture with gram-positive cocci likely contaminant.  Afebrile without leukocytosis, stable procalcitonin and CRP.  Negative repeat cultures.   7.  Hypertension.  Continue home regimen.    8. DM type II.  Continue home regimen upon discharge.  Follow-up with PCP for glycemic control.  Lab Results  Component Value Date   HGBA1C 8.3 (H) 06/17/2021     Discharge diagnosis     Principal Problem:  AMS (altered mental status) Active Problems:   Essential hypertension   Type 2 diabetes mellitus (HCC)   Marijuana abuse   Class 1 obesity due to excess calories with body mass index (BMI) of 30.0 to 30.9 in adult    Discharge instructions    Discharge Instructions     Discharge instructions   Complete by: As directed    Follow with Primary MD [name], Jason [name] in 7 days   Get CBC, CMP -  checked next visit within 1 week by Primary MD     Activity: As tolerated with Full fall precautions use walker/cane & assistance as needed  Disposition Home   Diet: Heart Healthy  Low Carb  Special Instructions: If you have smoked or chewed Tobacco  in the last 2 yrs please stop smoking, stop any regular Alcohol  and or any Recreational drug use.  On your next visit with your primary care physician please Get Medicines reviewed and adjusted.  Please request your Prim.MD to go over all Hospital Tests and Procedure/Radiological results at the follow up, please get all Hospital records sent to your Prim MD by signing hospital release before you go home.  If you experience worsening  of your admission symptoms, develop shortness of breath, life threatening emergency, suicidal or homicidal thoughts you must seek medical attention immediately by calling 911 or calling your MD immediately  if symptoms less severe.  You Must read complete instructions/literature along with all the possible adverse reactions/side effects for all the Medicines you take and that have been prescribed to you. Take any new Medicines after you have completely understood and accpet all the possible adverse reactions/side effects.   Increase activity slowly   Complete by: As directed        Discharge Medications   Allergies as of 07/15/2021       Reactions   Lisinopril    hyperkalemia   Reglan [metoclopramide] Anxiety   Patient had bad reaction requiring ativan in addition to benadryl. He doesn't want to receive it any more        Medication List     STOP taking these medications    butalbital-acetaminophen-caffeine 50-325-40 MG tablet Commonly known as: FIORICET       TAKE these medications    aspirin-acetaminophen-caffeine 250-250-65 MG tablet Commonly known as: EXCEDRIN MIGRAINE Take 1 tablet by mouth every 6 (six) hours as needed for headache.   CENTRUM SILVER PO Take 1 tablet by mouth daily.   diltiazem 120 MG 24 hr capsule Commonly known as: CARDIZEM CD TAKE 1 CAPSULE BY MOUTH EVERY DAY What changed:  how much to take how to take this when to take this additional instructions   glipiZIDE 10 MG 24 hr tablet Commonly known as: GLUCOTROL XL Take 1 tablet (10 mg total) by mouth daily with breakfast.   glucose blood test strip Commonly known as: ONE TOUCH ULTRA TEST USE TO TEST BLOOD GLUCOSE TWICE DAILY   ibuprofen 200 MG tablet Commonly known as: ADVIL Take 200 mg by mouth every 6 (six) hours as needed for mild pain.   Jardiance 25 MG Tabs tablet Generic drug: empagliflozin TAKE 1 TABLET (25 MG TOTAL) BY MOUTH DAILY. What changed: how much to take    metFORMIN 500 MG 24 hr tablet Commonly known as: Glucophage XR Take 2 tablets (1,000 mg total) by mouth in the morning and at bedtime.   ONE TOUCH ULTRA 2 w/Device Kit Use the device daily to check your blood sugars 1-4  times as instructed.   onetouch ultrasoft lancets Use to check blood sugar BID   rosuvastatin 5 MG tablet Commonly known as: Crestor Take 1 tablet (5 mg total) by mouth at bedtime.   vitamin B-12 1000 MCG tablet Commonly known as: CYANOCOBALAMIN Take 1 tablet (1,000 mcg total) by mouth daily. Start taking on: July 16, 2021         Follow-up Information     Jason [name], Jason Rumps, Jason [name]. Schedule an appointment as soon as possible for a visit in 1 week(s).   Specialty: Family Medicine Contact information: [address] [place]  [address] [phone]                 Major procedures and Radiology Reports - PLEASE review detailed and final reports thoroughly  -       CT Angio Head W or Wo Contrast  Result Date: 07/10/2021 CLINICAL DATA:  65 year old male. Persistent headache and confusion. Acute stroke suspected. EXAM: CT HEAD WITHOUT CONTRAST CTA HEAD AND NECK. TECHNIQUE: Contiguous axial images were obtained from the base of the skull through the vertex without intravenous contrast. Multi detector CTA of the head and neck during bolus administration of IV contrast. Multiplanar reformatted images, thick MIP images provided. COMPARISON:  Brain MRI 07/09/2021 at 2141 hours.  Head CT yesterday. FINDINGS: Brain: Mild motion artifact now. Stable non contrast CT appearance of the brain. No midline shift, ventriculomegaly, mass effect, evidence of mass lesion, intracranial hemorrhage or evidence of cortically based acute infarction. Vascular: No suspicious intracranial vascular hyperdensity. Skull: No acute osseous abnormality identified. Sinuses/Orbits: Visualized paranasal sinuses and mastoids are stable and well aerated. Other: Visualized orbits and scalp  soft tissues are within normal limits. ASPECTS [facility] Stroke Program Early CT Score) Total score (0-10 with 10 being normal): 10 CTA NECK Skeleton: Absent and carious dentition. Mild for age cervical spine degeneration. No acute osseous abnormality identified. Upper chest: Atelectasis. Other neck: Negative. Aortic arch: 3 vessel arch configuration.  No arch atherosclerosis. Right carotid system: Negative. Left carotid system: Negative. Vertebral arteries: Normal proximal right subclavian artery and right vertebral artery origin. Right vertebral artery appears patent and normal to the skull base. Normal proximal left subclavian artery and left vertebral artery origin. Left vertebral artery appears mildly dominant, mildly tortuous, patent and otherwise normal to the skull base. CTA HEAD Posterior circulation: Slightly dominant left vertebral artery V4 segment. Normal vertebral arteries to the vertebrobasilar junction. Normal PICA origins. Patent basilar artery without stenosis. Patent SCA and PCA origins. Both posterior communicating arteries are present. Bilateral PCA branches are within normal limits. Anterior circulation: Both ICA siphons are patent. No siphon plaque or stenosis. Normal ophthalmic and posterior communicating artery origins. Patent carotid termini, MCA and ACA origins. Left ACA A1 appears mildly dominant. Anterior communicating artery and bilateral ACA branches are within normal limits. Left MCA M1 segment and bifurcation are patent without stenosis. Left MCA branches are within normal limits. Right MCA M1 segment and trifurcation are within normal limits. Right MCA branches are within normal limits. Venous sinuses: Patent. Anatomic variants: Mildly dominant left vertebral artery, left ACA A1 segment. Review of the MIP images confirms the above findings IMPRESSION: 1. Negative CTA Head and Neck. No large vessel occlusion, no atherosclerosis or stenosis identified. 2. Stable and negative for age  noncontrast CT appearance of the brain. 3. Poor dentition. Electronically Signed   By: Genevie Ann M.D.   On: 07/10/2021 04:44   MR BRAIN WO CONTRAST  Result Date: 07/09/2021 CLINICAL  DATA:  Neuro deficit, acute stroke suspected. EXAM: MRI HEAD WITHOUT CONTRAST TECHNIQUE: Multiplanar, multiecho pulse sequences of the brain and surrounding structures were obtained without intravenous contrast. COMPARISON:  Same day head CT. FINDINGS: Motion limited evaluation.  Within limitation: Brain: No acute infarction, hemorrhage, hydrocephalus, extra-axial collection or mass lesion. Mild scattered T2 hyperintensities in the white matter, nonspecific but most likely related to mild chronic microvascular ischemic disease. Vascular: Major arterial flow voids are maintained at the skull base. Skull and upper cervical spine: Normal marrow signal. Sinuses/Orbits: Small left maxillary sinus retention cyst. Trace ethmoid air cell and frontal sinus mucosal thickening. No acute orbital findings. Other: No sizable mastoid effusions. IMPRESSION: 1. Motion limited exam without evidence of acute intracranial abnormality. Specifically, no acute infarct. 2. Mild chronic microvascular ischemic disease. Electronically Signed   By: Margaretha Sheffield MD   On: 07/09/2021 20:13   DG Chest Portable 1 View  Result Date: 07/10/2021 CLINICAL DATA:  65 year old male with altered mental status. EXAM: PORTABLE CHEST 1 VIEW COMPARISON:  Chest radiograph 05/24/2011. FINDINGS: Portable AP supine view at 0351 hours. Mildly rotated to the left. Lung volumes and mediastinal contours are stable and within normal limits. Visualized tracheal air column is within normal limits. Allowing for portable technique the lungs are clear. No pneumothorax or pleural effusion. No acute osseous abnormality identified. IMPRESSION: Negative portable chest. Electronically Signed   By: Genevie Ann M.D.   On: 07/10/2021 04:45   EEG adult  Result Date: 07/10/2021 Lora Havens, MD     07/10/2021  2:55 PM Patient Name: KARANDEEP RESENDE MRN: [id number] Epilepsy Attending: Lora Havens Referring Physician/Provider: Dr Kerney Elbe Date: 07/10/2021 Duration: 25.22 mins Patient history: 65yo M with new onset speech difficulty and confusion. EEG to evaluate for seizure Level of alertness: Awake, asleep AEDs during EEG study: None Technical aspects: This EEG study was done with scalp electrodes positioned according to the 10-20 International system of electrode placement. Electrical activity was acquired at a sampling rate of '500Hz'  and reviewed with a high frequency filter of '70Hz'  and a low frequency filter of '1Hz' . EEG data were recorded continuously and digitally stored. Description: The posterior dominant rhythm consists of 8-'9Hz'  activity of moderate voltage (25-35 uV) seen predominantly in posterior head regions, symmetric and reactive to eye opening and eye closing. Sleep was characterized by vertex waves, sleep spindles (12 to 14 Hz), maximal frontocentral region. Physiologic photic driving was seen during photic stimulation.  Hyperventilation was not performed.   IMPRESSION: This study is within normal limits. No seizures or epileptiform discharges were seen throughout the recording. Lora Havens   CT HEAD CODE STROKE WO CONTRAST  Result Date: 07/09/2021 CLINICAL DATA:  Code stroke. Neuro deficit, acute, stroke suspected. Additional history provided: Headache and slurred speech. EXAM: CT HEAD WITHOUT CONTRAST TECHNIQUE: Contiguous axial images were obtained from the base of the skull through the vertex without intravenous contrast. COMPARISON:  Head CT 12/11/2007.  Brain MRI 03/08/2004. FINDINGS: Brain: The examination is significantly motion degraded at the level of the maxillofacial structures and skull base. Cerebral volume is normal for age. There is no acute intracranial hemorrhage. No demarcated cortical infarct. No extra-axial fluid collection. No evidence of an  intracranial mass. No midline shift. Vascular: No hyperdense vessel. Skull: No calvarial fracture or focal suspicious calvarial lesion. Sinuses/Orbits: Visualized orbits show no acute finding. Left maxillary sinus mucous retention cyst. Trace mucosal thickening within the bilateral ethmoid air cells. ASPECTS [facility] Stroke Program Early CT  Score) - Ganglionic level infarction (caudate, lentiform nuclei, internal capsule, insula, M1-M3 cortex): 7 - Supraganglionic infarction (M4-M6 cortex): 3 Total score (0-10 with 10 being normal): 10 These results were communicated to Dr. Rory Percy at 7:40 pmon 7/29/2022by text page via the [facility] messaging system. IMPRESSION: Significantly motion degraded examination at the level of the maxillofacial structures and skull base. No evidence of acute intracranial abnormality Paranasal sinus disease, as described. Electronically Signed   By: Kellie Simmering DO   On: 07/09/2021 19:40   DG FL GUIDED LUMBAR PUNCTURE  Result Date: 07/12/2021 CLINICAL DATA:  Patient admitted for headache, drowsiness, lethargy presents to radiology today for image guided lumbar puncture. EXAM: DIAGNOSTIC LUMBAR PUNCTURE UNDER FLUOROSCOPIC GUIDANCE COMPARISON:  None FLUOROSCOPY TIME:  Fluoroscopy Time: 42 seconds Radiation Exposure Index (if provided by the fluoroscopic device): 9.4 mGy Number of Acquired Spot Images: 1 PROCEDURE: Informed consent was obtained from the patient prior to the procedure, including potential complications of headache, allergy, and pain. With the patient prone, the lower back was prepped with Betadine. 1% Lidocaine was used for local anesthesia. Lumbar puncture was performed at the L4-L5 level using a 20 gauge needle with return of clear CSF with an opening pressure of 22 cm water. 12 ml of CSF were obtained for laboratory studies. The patient tolerated the procedure well and there were no apparent complications. The needle was removed and the site covered with a band aid. IMPRESSION:  Technically successful image guided L4-L5 lumbar puncture yielding 12 mL of clear CSF. Read by: Soyla Dryer, Jason [name] Study was performed by advanced practice provider as outlined above under my supervision, interpreted by me. Electronically Signed   By: Zetta Bills M.D.   On: 07/12/2021 10:09    Micro Results     Recent Results (from the past 240 hour(s))  Resp Panel by RT-PCR (Flu A&B, Covid) Nasopharyngeal Swab     Status: None   Collection Time: 07/09/21  8:09 PM   Specimen: Nasopharyngeal Swab; Nasopharyngeal(Jason [name]) swabs in vial transport medium  Result Value Ref Range Status   SARS Coronavirus 2 by RT PCR NEGATIVE NEGATIVE Final    Comment: (NOTE) SARS-CoV-2 target nucleic acids are NOT DETECTED.  The SARS-CoV-2 RNA is generally detectable in upper respiratory specimens during the acute phase of infection. The lowest concentration of SARS-CoV-2 viral copies this assay can detect is 138 copies/mL. A negative result does not preclude SARS-Cov-2 infection and should not be used as the sole basis for treatment or other patient management decisions. A negative result may occur with  improper specimen collection/handling, submission of specimen other than nasopharyngeal swab, presence of viral mutation(s) within the areas targeted by this assay, and inadequate number of viral copies(<138 copies/mL). A negative result must be combined with clinical observations, patient history, and epidemiological information. The expected result is Negative.  Fact Sheet for Patients:  EntrepreneurPulse.com.au  Fact Sheet for Healthcare Providers:  IncredibleEmployment.be  This test is no t yet approved or cleared by the [place] FDA and  has been authorized for detection and/or diagnosis of SARS-CoV-2 by FDA under an Emergency Use Authorization (EUA). This EUA will remain  in effect (meaning this test can be used) for the duration of the COVID-19  declaration under Section 564(b)(1) of the Act, 21 U.S.C.section 360bbb-3(b)(1), unless the authorization is terminated  or revoked sooner.       Influenza A by PCR NEGATIVE NEGATIVE Final   Influenza B by PCR NEGATIVE NEGATIVE Final    Comment: (NOTE)  The Xpert Xpress SARS-CoV-2/FLU/RSV plus assay is intended as an aid in the diagnosis of influenza from Nasopharyngeal swab specimens and should not be used as a sole basis for treatment. Nasal washings and aspirates are unacceptable for Xpert Xpress SARS-CoV-2/FLU/RSV testing.  Fact Sheet for Patients: EntrepreneurPulse.com.au  Fact Sheet for Healthcare Providers: IncredibleEmployment.be  This test is not yet approved or cleared by the [place] FDA and has been authorized for detection and/or diagnosis of SARS-CoV-2 by FDA under an Emergency Use Authorization (EUA). This EUA will remain in effect (meaning this test can be used) for the duration of the COVID-19 declaration under Section 564(b)(1) of the Act, 21 U.S.C. section 360bbb-3(b)(1), unless the authorization is terminated or revoked.  Performed at [facility], [place] [address] [place], [place] [address]   Resp Panel by RT-PCR (Flu A&B, Covid) Nasopharyngeal Swab     Status: None   Collection Time: 07/10/21  2:27 AM   Specimen: Nasopharyngeal Swab; Nasopharyngeal(Jason [name]) swabs in vial transport medium  Result Value Ref Range Status   SARS Coronavirus 2 by RT PCR NEGATIVE NEGATIVE Final    Comment: (NOTE) SARS-CoV-2 target nucleic acids are NOT DETECTED.  The SARS-CoV-2 RNA is generally detectable in upper respiratory specimens during the acute phase of infection. The lowest concentration of SARS-CoV-2 viral copies this assay can detect is 138 copies/mL. A negative result does not preclude SARS-Cov-2 infection and should not be used as the sole basis for treatment or other patient management decisions. A negative result may  occur with  improper specimen collection/handling, submission of specimen other than nasopharyngeal swab, presence of viral mutation(s) within the areas targeted by this assay, and inadequate number of viral copies(<138 copies/mL). A negative result must be combined with clinical observations, patient history, and epidemiological information. The expected result is Negative.  Fact Sheet for Patients:  EntrepreneurPulse.com.au  Fact Sheet for Healthcare Providers:  IncredibleEmployment.be  This test is no t yet approved or cleared by the [place] FDA and  has been authorized for detection and/or diagnosis of SARS-CoV-2 by FDA under an Emergency Use Authorization (EUA). This EUA will remain  in effect (meaning this test can be used) for the duration of the COVID-19 declaration under Section 564(b)(1) of the Act, 21 U.S.C.section 360bbb-3(b)(1), unless the authorization is terminated  or revoked sooner.       Influenza A by PCR NEGATIVE NEGATIVE Final   Influenza B by PCR NEGATIVE NEGATIVE Final    Comment: (NOTE) The Xpert Xpress SARS-CoV-2/FLU/RSV plus assay is intended as an aid in the diagnosis of influenza from Nasopharyngeal swab specimens and should not be used as a sole basis for treatment. Nasal washings and aspirates are unacceptable for Xpert Xpress SARS-CoV-2/FLU/RSV testing.  Fact Sheet for Patients: EntrepreneurPulse.com.au  Fact Sheet for Healthcare Providers: IncredibleEmployment.be  This test is not yet approved or cleared by the [place] FDA and has been authorized for detection and/or diagnosis of SARS-CoV-2 by FDA under an Emergency Use Authorization (EUA). This EUA will remain in effect (meaning this test can be used) for the duration of the COVID-19 declaration under Section 564(b)(1) of the Act, 21 U.S.C. section 360bbb-3(b)(1), unless the authorization is terminated  or revoked.  Performed at [facility], [place] [address], [place], [place] [address]   Blood culture (routine x 2)     Status: Abnormal   Collection Time: 07/10/21  3:49 AM   Specimen: BLOOD  Result Value Ref Range Status   Specimen Description BLOOD  SITE NOT SPECIFIED  Final   Special Requests   Final    BOTTLES DRAWN AEROBIC AND ANAEROBIC Blood Culture adequate volume   Culture  Setup Time   Final    IN BOTH AEROBIC AND ANAEROBIC BOTTLES GRAM POSITIVE COCCI IN CLUSTERS CRITICAL RESULT CALLED TO, READ BACK BY AND VERIFIED WITH: Denton Brick PHARMD, AT 1696 07/11/21 D. VANHOOK    Culture (A)  Final    STAPHYLOCOCCUS EPIDERMIDIS THE SIGNIFICANCE OF ISOLATING THIS ORGANISM FROM A SINGLE SET OF BLOOD CULTURES WHEN MULTIPLE SETS ARE DRAWN IS UNCERTAIN. PLEASE NOTIFY THE MICROBIOLOGY DEPARTMENT WITHIN ONE WEEK IF SPECIATION AND SENSITIVITIES ARE REQUIRED. Performed at [facility], [place] [address] [place], [place] [address]    Report Status 07/13/2021 FINAL  Final  Blood Culture ID Panel (Reflexed)     Status: Abnormal   Collection Time: 07/10/21  3:49 AM  Result Value Ref Range Status   Enterococcus faecalis NOT DETECTED NOT DETECTED Final   Enterococcus Faecium NOT DETECTED NOT DETECTED Final   Listeria monocytogenes NOT DETECTED NOT DETECTED Final   Staphylococcus species DETECTED (A) NOT DETECTED Final    Comment: CRITICAL RESULT CALLED TO, READ BACK BY AND VERIFIED WITH: Denton Brick PHARMD, AT 1017 07/11/21 D. VANHOOK    Staphylococcus aureus (BCID) NOT DETECTED NOT DETECTED Final   Staphylococcus epidermidis DETECTED (A) NOT DETECTED Final    Comment: CRITICAL RESULT CALLED TO, READ BACK BY AND VERIFIED WITH: Denton Brick PHARMD, AT 0312 07/11/21 D. VANHOOK    Staphylococcus lugdunensis NOT DETECTED NOT DETECTED Final   Streptococcus species NOT DETECTED NOT DETECTED Final   Streptococcus agalactiae NOT DETECTED NOT DETECTED Final   Streptococcus pneumoniae NOT DETECTED NOT  DETECTED Final   Streptococcus pyogenes NOT DETECTED NOT DETECTED Final   A.calcoaceticus-baumannii NOT DETECTED NOT DETECTED Final   Bacteroides fragilis NOT DETECTED NOT DETECTED Final   Enterobacterales NOT DETECTED NOT DETECTED Final   Enterobacter cloacae complex NOT DETECTED NOT DETECTED Final   Escherichia coli NOT DETECTED NOT DETECTED Final   Klebsiella aerogenes NOT DETECTED NOT DETECTED Final   Klebsiella oxytoca NOT DETECTED NOT DETECTED Final   Klebsiella pneumoniae NOT DETECTED NOT DETECTED Final   Proteus species NOT DETECTED NOT DETECTED Final   Salmonella species NOT DETECTED NOT DETECTED Final   Serratia marcescens NOT DETECTED NOT DETECTED Final   Haemophilus influenzae NOT DETECTED NOT DETECTED Final   Neisseria meningitidis NOT DETECTED NOT DETECTED Final   Pseudomonas aeruginosa NOT DETECTED NOT DETECTED Final   Stenotrophomonas maltophilia NOT DETECTED NOT DETECTED Final   Candida albicans NOT DETECTED NOT DETECTED Final   Candida auris NOT DETECTED NOT DETECTED Final   Candida glabrata NOT DETECTED NOT DETECTED Final   Candida krusei NOT DETECTED NOT DETECTED Final   Candida parapsilosis NOT DETECTED NOT DETECTED Final   Candida tropicalis NOT DETECTED NOT DETECTED Final   Cryptococcus neoformans/gattii NOT DETECTED NOT DETECTED Final   Methicillin resistance mecA/C NOT DETECTED NOT DETECTED Final    Comment: Performed at [facility], [address], [place], [place] [address]  Urine Culture     Status: Abnormal   Collection Time: 07/10/21  4:18 AM   Specimen: Urine, Clean Catch  Result Value Ref Range Status   Specimen Description URINE, CLEAN CATCH  Final   Special Requests Normal  Final   Culture (A)  Final    20,000 COLONIES/mL STREPTOCOCCUS AGALACTIAE TESTING AGAINST S. AGALACTIAE NOT ROUTINELY PERFORMED DUE TO PREDICTABILITY OF AMP/PEN/VAN  SUSCEPTIBILITY. Performed at [facility], [place] [address], [place], [place] [address]    Report  Status 07/11/2021 FINAL  Final  Blood culture (routine x 2)     Status: None   Collection Time: 07/10/21  4:47 AM   Specimen: BLOOD  Result Value Ref Range Status   Specimen Description BLOOD SITE NOT SPECIFIED  Final   Special Requests   Final    BOTTLES DRAWN AEROBIC AND ANAEROBIC Blood Culture adequate volume   Culture   Final    NO GROWTH 5 DAYS Performed at [facility], [place] [address], [place], [place] [address]    Report Status 07/15/2021 FINAL  Final  CSF culture w Gram Stain     Status: None   Collection Time: 07/12/21  9:54 AM   Specimen: PATH Cytology CSF; Cerebrospinal Fluid  Result Value Ref Range Status   Specimen Description CSF  Final   Special Requests NONE  Final   Gram Stain   Final    WBC PRESENT,BOTH PMN AND MONONUCLEAR NO ORGANISMS SEEN CYTOSPIN SMEAR    Culture   Final    NO GROWTH 3 DAYS Performed at [facility], [place] [address] [place], [place] [address]    Report Status 07/15/2021 FINAL  Final  Culture, fungus without smear     Status: None (Preliminary result)   Collection Time: 07/12/21  9:54 AM   Specimen: PATH Cytology CSF; Cerebrospinal Fluid  Result Value Ref Range Status   Specimen Description CSF  Final   Special Requests NONE  Final   Culture   Final    NO FUNGUS ISOLATED AFTER 3 DAYS Performed at [facility], [place] [address] [place], [place] [address]    Report Status PENDING  Incomplete    Today   Subjective    Jason [name] today has no headache,no chest abdominal pain,no new weakness tingling or numbness, feels much better wants to go home today.    Objective   Blood pressure 160/70, pulse 61, temperature 98 F (36.7 C), temperature source Oral, resp. rate 16, weight 109.3 kg, SpO2 100 %.   Intake/Output Summary (Last 24 hours) at 07/15/2021 1118 Last data filed at 07/14/2021 2210 Gross per 24 hour  Intake 399.97 ml  Output --  Net 399.97 ml    Exam  Awake Alert, No new F.N deficits, Normal  affect, supple neck [place].AT,PERRAL Supple Neck,No JVD, No cervical lymphadenopathy appriciated.  Symmetrical Chest wall movement, Good air movement bilaterally, CTAB RRR,No Gallops,Rubs or new Murmurs, No Parasternal Heave +ve B.Sounds, Abd Soft, Non tender, No organomegaly appriciated, No rebound -guarding or rigidity. No Cyanosis, Clubbing or edema, No new Rash or bruise   Data Review   CBC w Diff:  Lab Results  Component Value Date   WBC 6.0 07/13/2021   HGB 14.0 07/13/2021   HCT 42.5 07/13/2021   PLT 149 (L) 07/13/2021   LYMPHOPCT 38 07/13/2021   MONOPCT 8 07/13/2021   EOSPCT 5 07/13/2021   BASOPCT 1 07/13/2021    CMP:  Lab Results  Component Value Date   NA 136 07/15/2021   K 4.0 07/15/2021   CL 102 07/15/2021   CO2 24 07/15/2021   BUN 14 07/15/2021   CREATININE 1.02 07/15/2021   PROT 6.9 07/13/2021   ALBUMIN 3.1 (L) 07/13/2021   BILITOT 0.4 07/13/2021   ALKPHOS 51 07/13/2021   AST 33 07/13/2021   ALT 39 07/13/2021  .   Total Time in  preparing paper work, data evaluation and todays exam - 45 minutes  Lala Lund M.D on 07/15/2021 at 11:18 AM  [facility]

## 2021-07-15 NOTE — Progress Notes (Signed)
Discharge instructions (including medications) discussed with and copy provided to patient/caregiver 

## 2021-07-15 NOTE — TOC Transition Note (Signed)
Transition of Care [facility]) - CM/SW Discharge Note   Patient Details  Name: Jason [name] MRN: [id number] Date of Birth: 1956-05-03  [facility] CM/SW Contact:  Tom-Johnson, Renea Ee, RN Phone Number: 07/15/2021, 11:22 AM   Clinical Narrative:    Patient is discharging home. Denies any DME needs. No needs from therapy. Wife will transport at discharge. No further needs from Case Manager at this time.   Final next level of care: Home/Self Care Barriers to Discharge: Barriers Resolved   Patient Goals and CMS Choice Patient states their goals for this hospitalization and ongoing recovery are:: To go home      Discharge Placement                       Discharge Plan and Services                                     Social Determinants of Health (SDOH) Interventions     Readmission Risk Interventions No flowsheet data found.

## 2021-07-15 NOTE — Plan of Care (Signed)
  Problem: Education: Goal: Knowledge of General Education information will improve Description: Including pain rating scale, medication(s)/side effects and non-pharmacologic comfort measures Outcome: Adequate for Discharge   

## 2021-07-15 NOTE — Chronic Care Management (AMB) (Signed)
    Chronic Care Management Pharmacy Assistant   Name: Jason [name]  MRN: [id number] DOB: [birth year]  07/15/21-  Called patient to remind of appointment with [name]) on (07/16/21 at 8:30am via telephone.)  Patient aware of appointment date, time, and type of appointment (either telephone or in person). Patient aware to have/bring all medications, supplements, blood pressure and/or blood sugar logs to visit.  Questions: Have you had any recent office visit or specialist visit outside of [place]? No.  Are there any concerns you would like to discuss during your office visit? Patient is being released from the hospital today due to having a headache. Patient has been in hospital since last Friday. They performed a lot of tests and everything came out good. Patient is going to have a sleep study next.  Are you having any problems obtaining your medications? (Whether it pharmacy issues or cost) No issues getting or affording his medications. Patient states he had just refilled all of his medications before going to the hospital.  If patient has any PAP medications ask if they are having any problems getting their PAP medication or refill? No patient assistance at this time.   Care Gaps:  AWV- scheduled for 09/08/21 Zoster vaccines - overdue since 04/22/21 Covid -19 vaccine booster 4 - overdue since 06/27/21 Influenza vaccine - overdue since 07/12/21  Star Rating Drug:  Rosuvastatin '5mg'$  - last filled on 07/02/21 90DS at [facility]  Any gaps in medications fill history? No.  [place]  Clinical Pharmacist Assistant [phone]

## 2021-07-15 NOTE — Discharge Instructions (Signed)
Follow with Primary MD [name], NP in 7 days   Get CBC, CMP -  checked next visit within 1 week by Primary MD     Activity: As tolerated with Full fall precautions use walker/cane & assistance as needed  Disposition Home   Diet: Heart Healthy  Low Carb  Special Instructions: If you have smoked or chewed Tobacco  in the last 2 yrs please stop smoking, stop any regular Alcohol  and or any Recreational drug use.  On your next visit with your primary care physician please Get Medicines reviewed and adjusted.  Please request your Prim.MD to go over all Hospital Tests and Procedure/Radiological results at the follow up, please get all Hospital records sent to your Prim MD by signing hospital release before you go home.  If you experience worsening of your admission symptoms, develop shortness of breath, life threatening emergency, suicidal or homicidal thoughts you must seek medical attention immediately by calling 911 or calling your MD immediately  if symptoms less severe.  You Must read complete instructions/literature along with all the possible adverse reactions/side effects for all the Medicines you take and that have been prescribed to you. Take any new Medicines after you have completely understood and accpet all the possible adverse reactions/side effects.

## 2021-07-16 ENCOUNTER — Ambulatory Visit (INDEPENDENT_AMBULATORY_CARE_PROVIDER_SITE_OTHER): Payer: Medicare Other | Admitting: Pharmacist

## 2021-07-16 DIAGNOSIS — E1169 Type 2 diabetes mellitus with other specified complication: Secondary | ICD-10-CM

## 2021-07-16 DIAGNOSIS — I10 Essential (primary) hypertension: Secondary | ICD-10-CM | POA: Diagnosis not present

## 2021-07-16 NOTE — Progress Notes (Signed)
Chronic Care Management Pharmacy Note  07/16/2021 Name:  Jason [name] MRN:  [id number] DOB:  [birth year]  Summary: A1c not at goal < 7%   Recommendations/Changes made from today's visit: -Once headaches resolves, consider switching glipizide to GLP1 for better A1c lowering and ASCVD benefits -Recommended neurology referral and sent message to PCP to place    Plan: Follow up BP and DM assessment in 1 month    Subjective: Jason [name] is an 65 y.o. year old male who is a primary patient of [name], NP.  The CCM team was consulted for assistance with disease management and care coordination needs.    Engaged with patient by telephone for follow up visit in response to provider referral for pharmacy case management and/or care coordination services.   Consent to Services:  The patient was given information about Chronic Care Management services, agreed to services, and gave verbal consent prior to initiation of services.  Please see initial visit note for detailed documentation.   Patient Care Team: [name], NP as PCP - General (Family Medicine) [name], [facility] as Pharmacist (Pharmacist)  Recent office visits: 06/17/21 [name], NP: Patient presented for DM follow up. Referral to pulmonary for sleep study.  03/04/21 [name], NP: Patient presented for DM follow up. No change in A1c at 7.2%. No change in medications.  12/03/20 [name], NP: Patient presented for annual exam. A1c 7.2%. No changes made.  09/07/20 [name], LPN: Patient presented for medicare annual wellness exam.  Recent consult visits: 10/12/20 Gala Romney (ophthalmology): Unable to access notes.  Hospital visits: 7/30-07/15/21 Patient admitted for altered mental status. Vitamin B12 low and recommended 1000 mcg daily supplementation.  Objective:  Lab Results  Component Value Date   CREATININE 1.02 07/15/2021   BUN 14 07/15/2021   GFR 58.60 (L) 06/17/2021    GFRNONAA >60 07/15/2021   GFRAA >60 05/25/2011   NA 136 07/15/2021   K 4.0 07/15/2021   CALCIUM 9.6 07/15/2021   CO2 24 07/15/2021   GLUCOSE 137 (H) 07/15/2021    Lab Results  Component Value Date/Time   HGBA1C 8.3 (H) 06/17/2021 08:31 AM   HGBA1C 7.2 (A) 03/04/2021 07:59 AM   HGBA1C 7.2 (A) 12/03/2020 08:45 AM   HGBA1C 7.8 (H) 05/28/2020 07:46 AM   HGBA1C 9.5 (A) 01/10/2019 08:30 AM   GFR 58.60 (L) 06/17/2021 08:31 AM   GFR 66.81 05/28/2020 07:46 AM   MICROALBUR 6.6 (H) 06/17/2021 08:31 AM   MICROALBUR 3.1 (H) 10/10/2018 08:30 AM    Last diabetic Eye exam:  Lab Results  Component Value Date/Time   HMDIABEYEEXA No Retinopathy 10/12/2020 12:00 AM    Last diabetic Foot exam: No results found for: HMDIABFOOTEX   Lab Results  Component Value Date   CHOL 150 07/10/2021   HDL 61 07/10/2021   LDLCALC 79 07/10/2021   TRIG 49 07/10/2021   CHOLHDL 2.5 07/10/2021    Hepatic Function Latest Ref Rng & Units 07/13/2021 07/12/2021 07/11/2021  Total Protein 6.5 - 8.1 g/dL 6.9 7.2 7.0  Albumin 3.5 - 5.0 g/dL 3.1(L) 3.3(L) 3.3(L)  AST 15 - 41 U/L 33 22 21  ALT 0 - 44 U/L 39 25 25  Alk Phosphatase 38 - 126 U/L 51 55 56  Total Bilirubin 0.3 - 1.2 mg/dL 0.4 0.8 0.5  Bilirubin, Direct 0.0 - 0.3 mg/dL - - -    Lab Results  Component Value Date/Time   TSH 2.119 07/10/2021 03:49 AM   TSH  2.92 06/17/2021 08:31 AM   TSH 2.96 05/28/2020 07:46 AM   FREET4 0.92 05/24/2011 01:59 PM    CBC Latest Ref Rng & Units 07/13/2021 07/12/2021 07/11/2021  WBC 4.0 - 10.5 K/uL 6.0 6.1 6.7  Hemoglobin 13.0 - 17.0 g/dL 14.0 14.4 13.7  Hematocrit 39.0 - 52.0 % 42.5 42.6 41.2  Platelets 150 - 400 K/uL 149(L) 159 160    No results found for: VD25OH  Clinical ASCVD: No  The 10-year ASCVD risk score Mikey Bussing DC Jr., et al., 2013) is: 41.1%   Values used to calculate the score:     Age: 36 years     Sex: Male     Is Non-Hispanic African American: Yes     Diabetic: Yes     Tobacco smoker: No     Systolic Blood  Pressure: 169 mmHg     Is BP treated: Yes     HDL Cholesterol: 61 mg/dL     Total Cholesterol: 150 mg/dL    Depression screen [facility] 2/9 09/07/2020 09/05/2019 02/02/2016  Decreased Interest 0 0 0  Down, Depressed, Hopeless 0 0 0  PHQ - 2 Score 0 0 0  Altered sleeping 0 - -  Tired, decreased energy 0 - -  Change in appetite 0 - -  Feeling bad or failure about yourself  0 - -  Trouble concentrating 0 - -  Moving slowly or fidgety/restless 0 - -  Suicidal thoughts 0 - -  PHQ-9 Score 0 - -  Difficult doing work/chores Not difficult at all - -      Social History   Tobacco Use  Smoking Status Never  Smokeless Tobacco Never   BP Readings from Last 3 Encounters:  07/15/21 (!) 169/76  07/09/21 (!) 152/94  06/17/21 106/80   Pulse Readings from Last 3 Encounters:  07/15/21 61  07/09/21 83  06/17/21 85   Wt Readings from Last 3 Encounters:  07/10/21 241 lb (109.3 kg)  06/17/21 241 lb (109.3 kg)  03/04/21 251 lb 6.4 oz (114 kg)   BMI Readings from Last 3 Encounters:  07/10/21 30.32 kg/m  06/17/21 30.32 kg/m  03/04/21 31.42 kg/m    Assessment/Interventions: Review of patient past medical history, allergies, medications, health status, including review of consultants reports, laboratory and other test data, was performed as part of comprehensive evaluation and provision of chronic care management services.   SDOH:  (Social Determinants of Health) assessments and interventions performed: No  SDOH Screenings   Alcohol Screen: Low Risk    Last Alcohol Screening Score (AUDIT): 4  Depression (PHQ2-9): Low Risk    PHQ-2 Score: 0  Financial Resource Strain: Low Risk    Difficulty of Paying Living Expenses: Not hard at all  Food Insecurity: No Food Insecurity   Worried About Charity fundraiser in the Last Year: Never true   Ran Out of Food in the Last Year: Never true  Housing: Low Risk    Last Housing Risk Score: 0  Physical Activity: Insufficiently Active   Days of  Exercise per Week: 3 days   Minutes of Exercise per Session: 30 min  Social Connections: Moderately Isolated   Frequency of Communication with Friends and Family: More than three times a week   Frequency of Social Gatherings with Friends and Family: More than three times a week   Attends Religious Services: More than 4 times per year   Active Member of [employer] or Organizations: No   Attends Archivist Meetings: Never  Marital Status: Widowed  Stress: No Stress Concern Present   Feeling of Stress : Not at all  Tobacco Use: Low Risk    Smoking Tobacco Use: Never   Smokeless Tobacco Use: Never  Transportation Needs: No Transportation Needs   Lack of Transportation (Medical): No   Lack of Transportation (Non-Medical): No    CCM Care Plan  Allergies  Allergen Reactions   Lisinopril     hyperkalemia   Reglan [Metoclopramide] Anxiety    Patient had bad reaction requiring ativan in addition to benadryl. He doesn't want to receive it any more    Medications Reviewed Today     Reviewed by Cassell Smiles, RN (Registered Nurse) on 07/10/21 at 1633  Med List Status: Complete   Medication Order Taking? Sig Documenting Provider Last Dose Status Informant  aspirin-acetaminophen-caffeine (EXCEDRIN MIGRAINE) 053-976-73 MG tablet [id number] Yes Take 1 tablet by mouth every 6 (six) hours as needed for headache. [provider] Past Month Active Spouse/Significant Other  Blood Glucose Monitoring Suppl (ONE TOUCH ULTRA 2) w/Device KIT [id number]  Use the device daily to check your blood sugars 1-4 times as instructed. Nafziger, Tommi Rumps, NP  Active Spouse/Significant Other  butalbital-acetaminophen-caffeine (FIORICET) 50-325-40 MG tablet [id number] Yes Take 1 tablet by mouth every 6 (six) hours as needed for headache. Drenda Freeze, MD  Active Spouse/Significant Other           Med Note Tollie Pizza   QQI Jul 10, 2021 10:41 AM) Picked up but has not yet started  diltiazem  (CARDIZEM CD) 120 MG 24 hr capsule [id number] Yes TAKE 1 CAPSULE BY MOUTH EVERY DAY  Patient taking differently: Take 120 mg by mouth daily.   [name], NP 07/09/2021 Active Spouse/Significant Other  glipiZIDE (GLUCOTROL XL) 10 MG 24 hr tablet [id number] Yes Take 1 tablet (10 mg total) by mouth daily with breakfast. [name], NP 07/09/2021 Active Spouse/Significant Other  glucose blood (ONE TOUCH ULTRA TEST) test strip [id number]  USE TO TEST BLOOD GLUCOSE TWICE DAILY Nafziger, Tommi Rumps, NP  Active Spouse/Significant Other  ibuprofen (ADVIL) 200 MG tablet [id number] Yes Take 200 mg by mouth every 6 (six) hours as needed for mild pain. [provider] Past Week Active Spouse/Significant Other  JARDIANCE 25 MG TABS tablet [id number] Yes TAKE 1 TABLET (25 MG TOTAL) BY MOUTH DAILY.  Patient taking differently: Take 25 mg by mouth daily.   [name], NP 07/09/2021 Active Spouse/Significant Other  Lancets (ONETOUCH ULTRASOFT) lancets [id number]  Use to check blood sugar BID Nafziger, Tommi Rumps, NP  Active Spouse/Significant Other  metFORMIN (GLUCOPHAGE XR) 500 MG 24 hr tablet [id number] Yes Take 2 tablets (1,000 mg total) by mouth in the morning and at bedtime. [name], NP 07/09/2021 Active Spouse/Significant Other  Multiple Vitamins-Minerals (CENTRUM SILVER PO) [id number] Yes Take 1 tablet by mouth daily. [provider] 07/09/2021 Active Spouse/Significant Other  rosuvastatin (CRESTOR) 5 MG tablet [id number] Yes Take 1 tablet (5 mg total) by mouth at bedtime. [name], NP 07/09/2021 Active Spouse/Significant Other            Patient Active Problem List   Diagnosis Date Noted   AMS (altered mental status) 07/10/2021   Marijuana abuse 07/10/2021   Class 1 obesity due to excess calories with body mass index (BMI) of 30.0 to 30.9 in adult 07/10/2021   Routine general medical examination at a health care facility 02/02/2016   Type 2 diabetes mellitus ([place]) 06/12/2012    Syncope - neurally mediated 07/04/2011  Essential hypertension 12/24/2007   HYPERGLYCEMIA 12/24/2007    Immunization History  Administered Date(s) Administered   Fluad Quad(high Dose 65+) 09/11/2019, 09/03/2020   Influenza Split 09/12/2012   Influenza Whole 12/24/2007   Influenza,inj,Quad PF,6+ Mos 02/02/2016, 10/10/2018   PFIZER(Purple Top)SARS-COV-2 Vaccination 02/24/2020, 03/16/2020, 02/25/2021   Pneumococcal Conjugate-13 09/11/2019   Pneumococcal Polysaccharide-23 02/25/2016   Td 01/31/2001   Tdap 02/02/2016   Zoster Recombinat (Shingrix) 02/25/2021   Patient started to have headaches again after he left the hospital. The work up was mostly negative and he is unsure of what is causing them. He did admit to light sensitivity but denies any nausea/vomiting. Will send message to PCP about getting a neurology referral. Patient requested Dr. Krista Blue.  Conditions to be addressed/monitored:  Hypertension, Hyperlipidemia and Diabetes  Care Plan : [place]  Updates made by [name], [place] since 07/16/2021 12:00 AM     Problem: Problem: diabetes, hypertension, hyperlipidemia      Long-Range Goal: Patient-Specific Goal   Start Date: 03/18/2021  Expected End Date: 03/18/2022  Recent Progress: On track  Priority: High  Note:   Current Barriers:  Unable to achieve control of diabetes   Pharmacist Clinical Goal(s):  Patient will achieve adherence to monitoring guidelines and medication adherence to achieve therapeutic efficacy achieve control of diabetes as evidenced by A1c  through collaboration with PharmD and provider.   Interventions: 1:1 collaboration with [name], NP regarding development and update of comprehensive plan of care as evidenced by provider attestation and co-signature Inter-disciplinary care team collaboration (see longitudinal plan of care) Comprehensive medication review performed; medication list updated in electronic medical  record  Hypertension (BP goal <130/80) -Not ideally controlled -Current treatment: Diltiazem (Cardizem CD) 189m, 1 capsule once daily -Medications previously tried: none  -Current home readings: higher in hospital (just discharged yesterday) -Current dietary habits: limits salt intake -Current exercise habits: walking almost daily -Denies hypotensive/hypertensive symptoms -Educated on Exercise goal of 150 minutes per week; Importance of home blood pressure monitoring; -Counseled to monitor BP at home twice weekly, document, and provide log at future appointments -Counseled on diet and exercise extensively Recommended to continue current medication Recommended bringing BP cuff to next office visit to make sure it is accurate.  Hyperlipidemia: (LDL goal < 70) -Uncontrolled -Current treatment: Rosuvastatin 5 mg 1 tablet daily  -Medications previously tried: none  -Current dietary patterns: working on cutting out sweets -Current exercise habits: walking almost daily -Educated on Cholesterol goals;  Benefits of statin for ASCVD risk reduction; Importance of limiting foods high in cholesterol; Exercise goal of 150 minutes per week; -Counseled on diet and exercise extensively Recommended to continue current medication  Diabetes (A1c goal <7%) -Uncontrolled -Current medications: empagliflozin (Jardiance) 234m 1 tablet once daily  Glipizide (Glucotrol XL) 1039m1 tablet once daily with breakfast Metformin ER 500m65m tablets twice daily with a meal -Medications previously tried: none  -Current home glucose readings fasting glucose: 139, 110-140 (only in the mornings) post prandial glucose: none -Reports hypoglycemic/hyperglycemic symptoms -Current meal patterns:  breakfast: did not discuss lunch: did not discuss  dinner: did not discuss snacks: did not discuss drinks: did not discuss -Current exercise: walking almost daily -Educated on A1c and blood sugar goals; Exercise  goal of 150 minutes per week; Carbohydrate counting and/or plate method -Counseled to check feet daily and get yearly eye exams -Counseled on diet and exercise extensively Recommended to continue current medication Recommended checking other times of the day a few times  a week such as after meals or before bedtime  Headaches (Goal: minimize severity of headaches) -Uncontrolled -Current treatment  Excedrin as needed -Medications previously tried: [place] on risk for medication overuse headache with caffeine component and recommended limiting use.   Health Maintenance -Vaccine gaps: Prevnar 20 -Current therapy:  Vitamin B12 1000 mcg daily -Educated on Cost vs benefit of each product must be carefully weighed by individual consumer -Patient is satisfied with current therapy and denies issues -Recommended to continue as is  Patient Goals/Self-Care Activities Patient will:  - take medications as prescribed check glucose daily, document, and provide at future appointments check blood pressure weekly, document, and provide at future appointments target a minimum of 150 minutes of moderate intensity exercise weekly engage in dietary modifications by cutting back on portion sizes  Follow Up Plan: Telephone follow up appointment with care management team member scheduled for: 3 months        Medication Assistance: None required.  Patient affirms current coverage meets needs.  Compliance/Adherence/Medication fill history: Care Gaps: Shingrix, COVID booster, influenza   Star-Rating Drugs: Rosuvastatin 26m - [facility]02/21 [facility] Metformin Jardiance 25 mg - last filled 06/23/21 for 30 DS at [facility] Metformin 500 mg - last filled 04/06/21 for 90 ds at [facility] Glipizide 10 mg - last filled 03/01/21 for [facility]  [facility]:  [id number] [place] [id number]:  [phone]: : [phone] pill box? No - patient reports routine; and taking all meds once a day in the morning. Pt endorses 100% compliance  We discussed: Current pharmacy is preferred with insurance plan and patient is satisfied with pharmacy services Patient decided to: Continue current medication management strategy  Care Plan and Follow Up Patient Decision:  Patient agrees to Care Plan and Follow-up.  Plan: Telephone follow up appointment with care management team member scheduled for:  3 months  MaJeni SallesPharmD BC[place]lip Terraceharmacist [phone]

## 2021-07-20 ENCOUNTER — Emergency Department (HOSPITAL_COMMUNITY): Payer: Medicare Other

## 2021-07-20 ENCOUNTER — Other Ambulatory Visit: Payer: Self-pay

## 2021-07-20 ENCOUNTER — Other Ambulatory Visit: Payer: Self-pay | Admitting: Adult Health

## 2021-07-20 ENCOUNTER — Encounter (HOSPITAL_COMMUNITY): Payer: Self-pay | Admitting: Emergency Medicine

## 2021-07-20 ENCOUNTER — Inpatient Hospital Stay (HOSPITAL_COMMUNITY)
Admission: EM | Admit: 2021-07-20 | Discharge: 2021-07-28 | DRG: 097 | Disposition: A | Discharge status: Home / Self Care | Payer: Medicare Other | Attending: Family Medicine | Admitting: Family Medicine

## 2021-07-20 DIAGNOSIS — E538 Deficiency of other specified B group vitamins: Secondary | ICD-10-CM | POA: Diagnosis present

## 2021-07-20 DIAGNOSIS — J341 Cyst and mucocele of nose and nasal sinus: Secondary | ICD-10-CM | POA: Diagnosis not present

## 2021-07-20 DIAGNOSIS — R7401 Elevation of levels of liver transaminase levels: Secondary | ICD-10-CM | POA: Diagnosis not present

## 2021-07-20 DIAGNOSIS — W57XXXA Bitten or stung by nonvenomous insect and other nonvenomous arthropods, initial encounter: Secondary | ICD-10-CM | POA: Diagnosis present

## 2021-07-20 DIAGNOSIS — F29 Unspecified psychosis not due to a substance or known physiological condition: Secondary | ICD-10-CM | POA: Diagnosis not present

## 2021-07-20 DIAGNOSIS — T148XXA Other injury of unspecified body region, initial encounter: Secondary | ICD-10-CM | POA: Diagnosis not present

## 2021-07-20 DIAGNOSIS — Z79899 Other long term (current) drug therapy: Secondary | ICD-10-CM | POA: Diagnosis not present

## 2021-07-20 DIAGNOSIS — A872 Lymphocytic choriomeningitis: Secondary | ICD-10-CM

## 2021-07-20 DIAGNOSIS — Z888 Allergy status to other drugs, medicaments and biological substances status: Secondary | ICD-10-CM

## 2021-07-20 DIAGNOSIS — R4781 Slurred speech: Secondary | ICD-10-CM | POA: Diagnosis not present

## 2021-07-20 DIAGNOSIS — I10 Essential (primary) hypertension: Secondary | ICD-10-CM | POA: Diagnosis present

## 2021-07-20 DIAGNOSIS — G032 Benign recurrent meningitis [Mollaret]: Secondary | ICD-10-CM | POA: Diagnosis not present

## 2021-07-20 DIAGNOSIS — R410 Disorientation, unspecified: Secondary | ICD-10-CM | POA: Diagnosis not present

## 2021-07-20 DIAGNOSIS — G9340 Encephalopathy, unspecified: Secondary | ICD-10-CM | POA: Diagnosis present

## 2021-07-20 DIAGNOSIS — G9341 Metabolic encephalopathy: Secondary | ICD-10-CM | POA: Diagnosis present

## 2021-07-20 DIAGNOSIS — E785 Hyperlipidemia, unspecified: Secondary | ICD-10-CM | POA: Diagnosis present

## 2021-07-20 DIAGNOSIS — Z7984 Long term (current) use of oral hypoglycemic drugs: Secondary | ICD-10-CM

## 2021-07-20 DIAGNOSIS — R4701 Aphasia: Secondary | ICD-10-CM | POA: Diagnosis present

## 2021-07-20 DIAGNOSIS — R29818 Other symptoms and signs involving the nervous system: Secondary | ICD-10-CM | POA: Diagnosis not present

## 2021-07-20 DIAGNOSIS — I639 Cerebral infarction, unspecified: Secondary | ICD-10-CM | POA: Diagnosis not present

## 2021-07-20 DIAGNOSIS — E119 Type 2 diabetes mellitus without complications: Secondary | ICD-10-CM | POA: Diagnosis present

## 2021-07-20 DIAGNOSIS — N179 Acute kidney failure, unspecified: Secondary | ICD-10-CM | POA: Diagnosis present

## 2021-07-20 DIAGNOSIS — E663 Overweight: Secondary | ICD-10-CM | POA: Diagnosis present

## 2021-07-20 DIAGNOSIS — Z20822 Contact with and (suspected) exposure to covid-19: Secondary | ICD-10-CM | POA: Diagnosis present

## 2021-07-20 DIAGNOSIS — W57XXXD Bitten or stung by nonvenomous insect and other nonvenomous arthropods, subsequent encounter: Secondary | ICD-10-CM

## 2021-07-20 DIAGNOSIS — G030 Nonpyogenic meningitis: Secondary | ICD-10-CM | POA: Diagnosis not present

## 2021-07-20 DIAGNOSIS — E1169 Type 2 diabetes mellitus with other specified complication: Secondary | ICD-10-CM | POA: Diagnosis not present

## 2021-07-20 DIAGNOSIS — G0430 Acute necrotizing hemorrhagic encephalopathy, unspecified: Secondary | ICD-10-CM | POA: Diagnosis not present

## 2021-07-20 DIAGNOSIS — Z6829 Body mass index (BMI) 29.0-29.9, adult: Secondary | ICD-10-CM

## 2021-07-20 DIAGNOSIS — D729 Disorder of white blood cells, unspecified: Secondary | ICD-10-CM | POA: Diagnosis not present

## 2021-07-20 DIAGNOSIS — I808 Phlebitis and thrombophlebitis of other sites: Secondary | ICD-10-CM | POA: Diagnosis present

## 2021-07-20 DIAGNOSIS — N281 Cyst of kidney, acquired: Secondary | ICD-10-CM | POA: Diagnosis not present

## 2021-07-20 DIAGNOSIS — M79602 Pain in left arm: Secondary | ICD-10-CM | POA: Diagnosis not present

## 2021-07-20 DIAGNOSIS — Z833 Family history of diabetes mellitus: Secondary | ICD-10-CM

## 2021-07-20 DIAGNOSIS — Z781 Physical restraint status: Secondary | ICD-10-CM

## 2021-07-20 DIAGNOSIS — G43901 Migraine, unspecified, not intractable, with status migrainosus: Secondary | ICD-10-CM

## 2021-07-20 DIAGNOSIS — R471 Dysarthria and anarthria: Secondary | ICD-10-CM | POA: Diagnosis present

## 2021-07-20 DIAGNOSIS — N2889 Other specified disorders of kidney and ureter: Secondary | ICD-10-CM | POA: Diagnosis not present

## 2021-07-20 DIAGNOSIS — I6782 Cerebral ischemia: Secondary | ICD-10-CM | POA: Diagnosis not present

## 2021-07-20 DIAGNOSIS — R2990 Unspecified symptoms and signs involving the nervous system: Secondary | ICD-10-CM | POA: Diagnosis not present

## 2021-07-20 DIAGNOSIS — R519 Headache, unspecified: Secondary | ICD-10-CM | POA: Diagnosis not present

## 2021-07-20 DIAGNOSIS — Z743 Need for continuous supervision: Secondary | ICD-10-CM | POA: Diagnosis not present

## 2021-07-20 DIAGNOSIS — R6889 Other general symptoms and signs: Secondary | ICD-10-CM | POA: Diagnosis not present

## 2021-07-20 DIAGNOSIS — G039 Meningitis, unspecified: Secondary | ICD-10-CM | POA: Diagnosis not present

## 2021-07-20 DIAGNOSIS — R404 Transient alteration of awareness: Secondary | ICD-10-CM | POA: Diagnosis not present

## 2021-07-20 LAB — I-STAT CHEM 8, ED
BUN: 24 mg/dL — ABNORMAL HIGH (ref 8–23)
Calcium, Ion: 1.11 mmol/L — ABNORMAL LOW (ref 1.15–1.40)
Chloride: 109 mmol/L (ref 98–111)
Creatinine, Ser: 1.2 mg/dL (ref 0.61–1.24)
Glucose, Bld: 135 mg/dL — ABNORMAL HIGH (ref 70–99)
HCT: 46 % (ref 39.0–52.0)
Hemoglobin: 15.6 g/dL (ref 13.0–17.0)
Potassium: 3.9 mmol/L (ref 3.5–5.1)
Sodium: 141 mmol/L (ref 135–145)
TCO2: 24 mmol/L (ref 22–32)

## 2021-07-20 LAB — URINALYSIS, ROUTINE W REFLEX MICROSCOPIC
Bacteria, UA: NONE SEEN
Bilirubin Urine: NEGATIVE
Glucose, UA: >=500 mg/dL — AB
Hgb urine dipstick: NEGATIVE
Ketones, ur: 20 mg/dL — AB
Leukocytes,Ua: NEGATIVE
Nitrite: NEGATIVE
Protein, ur: NEGATIVE mg/dL
Specific Gravity, Urine: 1.035 — ABNORMAL HIGH (ref 1.005–1.030)
pH: 5 (ref 5.0–8.0)

## 2021-07-20 LAB — DIFFERENTIAL
Abs Immature Granulocytes: 0.02 10*3/uL (ref 0.00–0.07)
Basophils Absolute: 0.1 10*3/uL (ref 0.0–0.1)
Basophils Relative: 1 %
Eosinophils Absolute: 0.2 10*3/uL (ref 0.0–0.5)
Eosinophils Relative: 2 %
Immature Granulocytes: 0 %
Lymphocytes Relative: 34 %
Lymphs Abs: 3 10*3/uL (ref 0.7–4.0)
Monocytes Absolute: 0.6 10*3/uL (ref 0.1–1.0)
Monocytes Relative: 6 %
Neutro Abs: 5.1 10*3/uL (ref 1.7–7.7)
Neutrophils Relative %: 57 %

## 2021-07-20 LAB — COMPREHENSIVE METABOLIC PANEL
ALT: 62 U/L — ABNORMAL HIGH (ref 0–44)
AST: 37 U/L (ref 15–41)
Albumin: 4 g/dL (ref 3.5–5.0)
Alkaline Phosphatase: 63 U/L (ref 38–126)
Anion gap: 15 (ref 5–15)
BUN: 20 mg/dL (ref 8–23)
CO2: 21 mmol/L — ABNORMAL LOW (ref 22–32)
Calcium: 10.3 mg/dL (ref 8.9–10.3)
Chloride: 102 mmol/L (ref 98–111)
Creatinine, Ser: 1.36 mg/dL — ABNORMAL HIGH (ref 0.61–1.24)
GFR, Estimated: 57 mL/min — ABNORMAL LOW (ref >60)
Glucose, Bld: 137 mg/dL — ABNORMAL HIGH (ref 70–99)
Potassium: 3.7 mmol/L (ref 3.5–5.1)
Sodium: 138 mmol/L (ref 135–145)
Total Bilirubin: 0.7 mg/dL (ref 0.3–1.2)
Total Protein: 7.9 g/dL (ref 6.5–8.1)

## 2021-07-20 LAB — RESP PANEL BY RT-PCR (FLU A&B, COVID) ARPGX2
Influenza A by PCR: NEGATIVE
Influenza B by PCR: NEGATIVE
SARS Coronavirus 2 by RT PCR: NEGATIVE

## 2021-07-20 LAB — CBC
HCT: 47.1 % (ref 39.0–52.0)
Hemoglobin: 15.6 g/dL (ref 13.0–17.0)
MCH: 32.6 pg (ref 26.0–34.0)
MCHC: 33.1 g/dL (ref 30.0–36.0)
MCV: 98.5 fL (ref 80.0–100.0)
Platelets: 186 10*3/uL (ref 150–400)
RBC: 4.78 MIL/uL (ref 4.22–5.81)
RDW: 14 % (ref 11.5–15.5)
WBC: 8.9 10*3/uL (ref 4.0–10.5)
nRBC: 0 % (ref 0.0–0.2)

## 2021-07-20 LAB — RAPID URINE DRUG SCREEN, HOSP PERFORMED
Amphetamines: NOT DETECTED
Barbiturates: POSITIVE — AB
Benzodiazepines: NOT DETECTED
Cocaine: NOT DETECTED
Opiates: NOT DETECTED
Tetrahydrocannabinol: NOT DETECTED

## 2021-07-20 LAB — PROTIME-INR
INR: 1.1 (ref 0.8–1.2)
Prothrombin Time: 14 seconds (ref 11.4–15.2)

## 2021-07-20 LAB — CBG MONITORING, ED
Glucose-Capillary: 143 mg/dL — ABNORMAL HIGH (ref 70–99)
Glucose-Capillary: 166 mg/dL — ABNORMAL HIGH (ref 70–99)

## 2021-07-20 LAB — ETHANOL: Alcohol, Ethyl (B): <10 mg/dL (ref <10)

## 2021-07-20 LAB — APTT: aPTT: 28 seconds (ref 24–36)

## 2021-07-20 MED ORDER — INSULIN ASPART 100 UNIT/ML IJ SOLN
0.0000 [IU] | INTRAMUSCULAR | Status: DC
  Administered 2021-07-21: 2 [IU] via SUBCUTANEOUS
  Administered 2021-07-21: 1 [IU] via SUBCUTANEOUS
  Administered 2021-07-22: 2 [IU] via SUBCUTANEOUS
  Administered 2021-07-22: 1 [IU] via SUBCUTANEOUS
  Administered 2021-07-22: 2 [IU] via SUBCUTANEOUS
  Administered 2021-07-22 (×2): 1 [IU] via SUBCUTANEOUS
  Administered 2021-07-23: 3 [IU] via SUBCUTANEOUS
  Administered 2021-07-23: 1 [IU] via SUBCUTANEOUS
  Administered 2021-07-23: 3 [IU] via SUBCUTANEOUS
  Administered 2021-07-23 (×3): 1 [IU] via SUBCUTANEOUS
  Administered 2021-07-24 (×2): 2 [IU] via SUBCUTANEOUS
  Administered 2021-07-24: 1 [IU] via SUBCUTANEOUS
  Administered 2021-07-24: 2 [IU] via SUBCUTANEOUS
  Administered 2021-07-25 (×2): 5 [IU] via SUBCUTANEOUS
  Administered 2021-07-25: 2 [IU] via SUBCUTANEOUS
  Administered 2021-07-25 – 2021-07-26 (×2): 1 [IU] via SUBCUTANEOUS
  Administered 2021-07-26 (×2): 5 [IU] via SUBCUTANEOUS
  Administered 2021-07-27: 2 [IU] via SUBCUTANEOUS
  Administered 2021-07-27: 3 [IU] via SUBCUTANEOUS
  Administered 2021-07-27: 2 [IU] via SUBCUTANEOUS

## 2021-07-20 MED ORDER — ONDANSETRON HCL 4 MG/2ML IJ SOLN
4.0000 mg | Freq: Four times a day (QID) | INTRAMUSCULAR | Status: DC | PRN
  Administered 2021-07-23: 4 mg via INTRAVENOUS
  Filled 2021-07-20: qty 2

## 2021-07-20 MED ORDER — DEXTROSE 5 % IV SOLN
1000.0000 mg | Freq: Three times a day (TID) | INTRAVENOUS | Status: DC
  Administered 2021-07-21 – 2021-07-24 (×11): 1000 mg via INTRAVENOUS
  Filled 2021-07-20 (×15): qty 20

## 2021-07-20 MED ORDER — LACTATED RINGERS IV SOLN: INTRAVENOUS | Status: DC

## 2021-07-20 MED ORDER — ACETAMINOPHEN 325 MG PO TABS
650.0000 mg | ORAL_TABLET | Freq: Four times a day (QID) | ORAL | Status: DC | PRN
  Administered 2021-07-26 (×2): 650 mg via ORAL
  Filled 2021-07-20 (×2): qty 2

## 2021-07-20 MED ORDER — KETOROLAC TROMETHAMINE 15 MG/ML IJ SOLN
15.0000 mg | Freq: Once | INTRAMUSCULAR | Status: AC
  Administered 2021-07-20: 15 mg via INTRAVENOUS
  Filled 2021-07-20: qty 1

## 2021-07-20 MED ORDER — CYANOCOBALAMIN 1000 MCG/ML IJ SOLN
1000.0000 ug | Freq: Once | INTRAMUSCULAR | Status: DC
  Filled 2021-07-20: qty 1

## 2021-07-20 MED ORDER — DIPHENHYDRAMINE HCL 50 MG/ML IJ SOLN
25.0000 mg | Freq: Once | INTRAMUSCULAR | Status: AC
  Administered 2021-07-20: 25 mg via INTRAVENOUS
  Filled 2021-07-20: qty 1

## 2021-07-20 MED ORDER — SODIUM CHLORIDE 0.9 % IV SOLN
100.0000 mg | Freq: Two times a day (BID) | INTRAVENOUS | Status: DC
  Administered 2021-07-21: 100 mg via INTRAVENOUS
  Filled 2021-07-20 (×3): qty 100

## 2021-07-20 MED ORDER — SODIUM CHLORIDE 0.9 % IV SOLN
3000.0000 mg | INTRAVENOUS | Status: AC
  Administered 2021-07-20: 3000 mg via INTRAVENOUS
  Filled 2021-07-20 (×4): qty 30

## 2021-07-20 MED ORDER — ACETAMINOPHEN 650 MG RE SUPP: 650.0000 mg | Freq: Four times a day (QID) | RECTAL | Status: DC | PRN

## 2021-07-20 MED ORDER — PROCHLORPERAZINE EDISYLATE 10 MG/2ML IJ SOLN
10.0000 mg | Freq: Once | INTRAMUSCULAR | Status: AC
  Administered 2021-07-20: 10 mg via INTRAVENOUS
  Filled 2021-07-20: qty 2

## 2021-07-20 MED ORDER — ONDANSETRON HCL 4 MG PO TABS: 4.0000 mg | ORAL_TABLET | Freq: Four times a day (QID) | ORAL | Status: DC | PRN

## 2021-07-20 NOTE — ED Notes (Signed)
Pt taken to MRI with Stroke RN, rapid RN, and ED RN

## 2021-07-20 NOTE — Progress Notes (Signed)
Pharmacy Antibiotic Note  81 yom presenting with confusion and aphasia. Patient recently admitted for similar presentation for which meningitis coverage was initiated. HSV PCR negative at that time. Pharmacy has been consulted for acyclovir dosing in the setting of concern for aseptic meningitis of non HSV origin.  Plan: Acyclovir '1000mg'$  q8hr unless change in renal function LR @ 177m/hr Monitor renal function F/u neuro recommendations  Height: '6\' 3"'$  (190.5 cm) Weight: 106.4 kg (234 lb 9.1 oz) IBW/kg (Calculated) : 84.5  Temp (24hrs), Avg:97.5 F (36.4 C), Min:97.5 F (36.4 C), Max:97.5 F (36.4 C)  Recent Labs  Lab 07/14/21 0202 07/15/21 0436 07/20/21 1631 07/20/21 1637  WBC  --   --  8.9  --   CREATININE 1.06 1.02 1.36* 1.20    Estimated Creatinine Clearance: 78.8 mL/min (by C-G formula based on SCr of 1.2 mg/dL).    Allergies  Allergen Reactions   Lisinopril     hyperkalemia   Reglan [Metoclopramide] Anxiety    Patient had bad reaction requiring ativan in addition to benadryl. He doesn't want to receive it any more   Antimicrobials this admission: Acyclovir 8/9 >>   Microbiology results: Pending  Thank you for allowing pharmacy to be a part of this patient's care.  JHeloise Purpura8/08/2021 8:23 PM

## 2021-07-20 NOTE — ED Triage Notes (Signed)
Pt arrives via EMS from home as code stroke, LSN 1545 where pt had sudden speech problems. Pt alert but confused on arrival. CBG 143

## 2021-07-20 NOTE — Consult Note (Addendum)
Neurology Consultation  Reason for Consult: Confusion, aphasia Referring Physician: Dr. Laverta Baltimore, EDP  CC: Confusion, aphasia  History is obtained from: Chart 20 minutes  HPI: Jason [name] is a 65 y.o. male past medical history of diabetes, hypertension, recent admission for similar episodes of aphasia and confusion, in the setting of intermittent headaches diagnosed with aseptic meningitis versus partially treated meningitis, B12 deficiency, presenting for sudden onset of confusion and difficulty with word finding started around 3:45 PM today. Examination is detailed below and is very similar to what he has been seen in the past couple of weeks by the neurology service.  Patient is unable to provide any detailed history. Patient's wife is presumably on her way to the hospital-unable to speak with her over the phone Reports a current headache.  LKW: 3:45 PM today tpa given?: no, stereotypical episode as seen before-low suspicion for acute stroke Premorbid modified Rankin scale (mRS): 0   ROS: Unable to obtain due to altered mental status.   Past Medical History:  Diagnosis Date   Bell palsy    Diabetes mellitus without complication (HCC)    HTN (hypertension)       Family History  Problem Relation Age of Onset   Stroke Mother    Colon cancer Brother    High blood pressure Brother    Stomach cancer Brother    Colon cancer Other        1st degree relative <60   Diabetes Other        1st degree relative    Stroke Other    Colon cancer Sister    High blood pressure Sister    Esophageal cancer Neg Hx    Rectal cancer Neg Hx      Social History:   reports that he has never smoked. He has never used smokeless tobacco. He reports current alcohol use of about 6.0 standard drinks of alcohol per week. He reports that he does not use drugs.  Medications No current facility-administered medications for this encounter.  Current Outpatient Medications:     aspirin-acetaminophen-caffeine (EXCEDRIN MIGRAINE) 250-250-65 MG tablet, Take 1 tablet by mouth every 6 (six) hours as needed for headache., Disp: , Rfl:    Blood Glucose Monitoring Suppl (ONE TOUCH ULTRA 2) w/Device KIT, Use the device daily to check your blood sugars 1-4 times as instructed., Disp: 1 kit, Rfl: 0   diltiazem (CARDIZEM CD) 120 MG 24 hr capsule, TAKE 1 CAPSULE BY MOUTH EVERY DAY, Disp: 90 capsule, Rfl: 3   glipiZIDE (GLUCOTROL XL) 10 MG 24 hr tablet, Take 1 tablet (10 mg total) by mouth daily with breakfast., Disp: 90 tablet, Rfl: 0   glucose blood (ONE TOUCH ULTRA TEST) test strip, USE TO TEST BLOOD GLUCOSE TWICE DAILY, Disp: 200 each, Rfl: 3   ibuprofen (ADVIL) 200 MG tablet, Take 200 mg by mouth every 6 (six) hours as needed for mild pain., Disp: , Rfl:    JARDIANCE 25 MG TABS tablet, TAKE 1 TABLET (25 MG TOTAL) BY MOUTH DAILY., Disp: 90 tablet, Rfl: 0   Lancets (ONETOUCH ULTRASOFT) lancets, Use to check blood sugar BID, Disp: 100 each, Rfl: 12   metFORMIN (GLUCOPHAGE XR) 500 MG 24 hr tablet, Take 2 tablets (1,000 mg total) by mouth in the morning and at bedtime., Disp: 360 tablet, Rfl: 0   Multiple Vitamins-Minerals (CENTRUM SILVER PO), Take 1 tablet by mouth daily., Disp: , Rfl:    rosuvastatin (CRESTOR) 5 MG tablet, Take 1 tablet (5 mg total)  by mouth at bedtime., Disp: 90 tablet, Rfl: 3   vitamin B-12 (CYANOCOBALAMIN) 1000 MCG tablet, Take 1 tablet (1,000 mcg total) by mouth daily., Disp: 30 tablet, Rfl: 0   Exam: Current vital signs: BP (!) 152/73   Wt 106.4 kg   BMI 29.52 kg/m  Vital signs in last 24 hours: BP: (152-153)/(73-78) 152/73 (08/09 1640) Weight:  [106.4 kg] 106.4 kg (08/09 1632) General: Awake alert in no distress HEENT: Normocephalic/atraumatic Lungs: Clear Cardiovascular: Regular rhythm next abdomen nondistended nontender Extremities warm well perfused Neurological exam Awake alert oriented to the fact that he is in the hospital, was able to tell me  his correct age and name. Mildly dysarthric Mild aphasia-unable to name all pictures-missed the cactus and the key. Mild loss of fluency Cranial nerves II to XII intact Motor exam with no drift Sensation intact to touch Coordination with no dysmetria in the upper extremities, unable to follow commands in the lower extremities 1a Level of Conscious.: 0 1b LOC Questions: 0 1c LOC Commands: 0 2 Best Gaze: 0 3 Visual: 0 4 Facial Palsy: 0 5a Motor Arm - left: 0 5b Motor Arm - Right: 0 6a Motor Leg - Left: 0 6b Motor Leg - Right: 0 7 Limb Ataxia: 0 8 Sensory: 0 9 Best Language: 1 10 Dysarthria: 1 11 Extinct. and Inatten.: 0 TOTAL: 2  Labs I have reviewed labs in epic and the results pertinent to this consultation are:  CBC    Component Value Date/Time   WBC 6.0 07/13/2021 0325   RBC 4.39 07/13/2021 0325   HGB 14.0 07/13/2021 0325   HCT 42.5 07/13/2021 0325   PLT 149 (L) 07/13/2021 0325   MCV 96.8 07/13/2021 0325   MCH 31.9 07/13/2021 0325   MCHC 32.9 07/13/2021 0325   RDW 13.4 07/13/2021 0325   LYMPHSABS 2.3 07/13/2021 0325   MONOABS 0.5 07/13/2021 0325   EOSABS 0.3 07/13/2021 0325   BASOSABS 0.0 07/13/2021 0325   CMP     Component Value Date/Time   NA 136 07/15/2021 0436   K 4.0 07/15/2021 0436   CL 102 07/15/2021 0436   CO2 24 07/15/2021 0436   GLUCOSE 137 (H) 07/15/2021 0436   BUN 14 07/15/2021 0436   CREATININE 1.02 07/15/2021 0436   CALCIUM 9.6 07/15/2021 0436   PROT 6.9 07/13/2021 0325   ALBUMIN 3.1 (L) 07/13/2021 0325   AST 33 07/13/2021 0325   ALT 39 07/13/2021 0325   ALKPHOS 51 07/13/2021 0325   BILITOT 0.4 07/13/2021 0325   GFRNONAA >60 07/15/2021 0436   GFRAA >60 05/25/2011 0449   Imaging I have reviewed the images obtained: CT head with no acute changes Limited sequence code stroke MRI-preliminary review of DWI sequences negative for acute ischemia  Assessment:  65 year old with above past medical history and recent admission for  presentation for aphasia confusion headaches with CSF concerning for meningitis but no organism identified-deemed to be consistent with aseptic meningitis not HSV viral most likely. Comes in with similar episode aphasia and confusion with last known well at 3:45 PM. Stat MRI negative for acute stroke Due to his now second or third presentation with similar symptoms and concern fo CNS infection that is now presumably being treated, I would hokum after EEG for evaluation for any underlying electrographic abnormalities.  Impression: Stereotypical spells of confusion and aphasia-evaluate for underlying EEG seizures/nonconvulsive status epilepticus Complex migraine B12 deficiency  Recommendations: Cancel code stroke Every 2 neurochecks Routine EEG followed by long-term EEG. Migraine cocktail-Toradol/Compazine/Benadryl  IV Loaded with Keppra 3000 mg x 1 Decision on whether to continue AED will depend on the EEG read. Continue B12 replenishment We will follow with you Plan discussed with Dr. Laverta Baltimore in the ER  -- Amie Portland, MD Neurologist [facility] Pager: [phone]   Addendum Discussed with Dr. Alcario Drought who is admitting. Moderates meningitis-recurrent aseptic meningitis also is a consideration. No need to empirically cover with antivirals or antibiotics. Will appreciate ID input-with the feel strongly about a spinal tap, then we will proceed with that.  Bedside tap unfortunately was unsuccessful last try and he will need IR guided tap at that time. Discussed my plan with Dr. Alcario Drought.   CRITICAL CARE ATTESTATION Performed by: Amie Portland, MD Total critical care time: 50 minutes Critical care time was exclusive of separately billable procedures and treating other patients and/or supervising APPs/Residents/Students Critical care was necessary to treat or prevent imminent or life-threatening deterioration due to strokelike episode, complex migraine versus seizure/status  epilepticus This patient is critically ill and at significant risk for neurological worsening and/or death and care requires constant monitoring. Critical care was time spent personally by me on the following activities: development of treatment plan with patient and/or surrogate as well as nursing, discussions with consultants, evaluation of patient's response to treatment, examination of patient, obtaining history from patient or surrogate, ordering and performing treatments and interventions, ordering and review of laboratory studies, ordering and review of radiographic studies, pulse oximetry, re-evaluation of patient's condition, participation in multidisciplinary rounds and medical decision making of high complexity in the care of this patient.

## 2021-07-20 NOTE — Code Documentation (Signed)
Stroke Response Nurse Documentation Code Documentation  BERLON WIRTHLIN is a 65 y.o. male arriving to [facility] ED via [facility] on 07/20/21 with past medical hx of diabetes and hypertension. Code stroke was activated by EMS. Patient from home where he was LKW today at 31 and now complaining of confusion and aphasia. His girlfriend called EMS, when EMS arrived pt was able to answer yes/no questions but still confused.   On aspirin 81 mg daily. Stroke team at the bedside on patient arrival. Labs drawn and patient cleared for CT by Dr. Laverta Baltimore. Patient to CT with team. NIHSS 3, see documentation for details and code stroke times. The following imaging was completed: CT, CTA head and neck, MRI limited. Patient is not a candidate for tPA due to imaging negative per MD Rory Percy.  Code Stroke cancelled per MD Rory Percy.   Care/Plan: q2h x12h mNIHSS/VS, EEG. Bedside handoff with ED RN Burman Nieves.    Candace Cruise K  Stroke Response RN

## 2021-07-20 NOTE — H&P (Signed)
History and Physical    Jason [name] JME:[id number] DOB: 10-Nov-1956 DOA: 07/20/2021  PCP: [name], NP  Patient coming from: Home  I have personally briefly reviewed patient's old medical records in [place]  Chief Complaint: Confusion, aphasia  HPI: Jason [name] is a 65 y.o. male with medical history significant of HTN, DM2.  Pt with tick bite a couple of weeks ago prior to onset of symptoms.  Pt presented to ED on 7/29 / 7/30 with 1 week h/o headache progressing to confusion.  No reported fever or chills.  Pt admitted to hospital service 7/30-8/4.  Work up that admit was suggestive of aseptic (viral) meningitis:  CSF: 165 WBC, 98% lymphs, did have 81 protein, 66 glucose.  Culture was neg, HSV 1+2 CSF was neg, Lyme CSF = neg, RMSF peripheral IGM was neg.  Initially on broad spectrum ABx + acyclovir, this then tapered to acyclovir and ultimately taken off of acyclovir after HSV came back neg.  Pt had improved significantly per notes and per wife (who is at bedside giving history).  Pt discharged on 8/4.  Following discharge, pt with relapse of symptoms: headache, no meningismus, AMS with word finding difficulty around 3:45pm today.  Returns to ED for recurrent symptoms.  Pt denies meningismus.  Says headache is mild.  Pt does have AMS / lethargy at this time but wakes up to voice and answers questions.   ED Course: Afebrile, no SIRS.  Neurology initially concerned for seizure and so loaded him with keppra.  Gave migraine cocktail.  MRI - unable to get T1 and T2 images, but did get DWIs which were neg for acute stroke.   Review of Systems: As per HPI, otherwise all review of systems negative.  Past Medical History:  Diagnosis Date   Bell palsy    Diabetes mellitus without complication (HCC)    HTN (hypertension)     Past Surgical History:  Procedure Laterality Date   COLONOSCOPY W/ POLYPECTOMY       reports that he has never smoked. He has  never used smokeless tobacco. He reports current alcohol use of about 6.0 standard drinks of alcohol per week. He reports that he does not use drugs.  Allergies  Allergen Reactions   Lisinopril     hyperkalemia   Reglan [Metoclopramide] Anxiety    Patient had bad reaction requiring ativan in addition to benadryl. He doesn't want to receive it any more    Family History  Problem Relation Age of Onset   Stroke Mother    Colon cancer Sister    High blood pressure Sister    Colon cancer Brother    High blood pressure Brother    Stomach cancer Brother    Lymphoma Son    Colon cancer Other        1st degree relative <60   Diabetes Other        1st degree relative    Stroke Other    Esophageal cancer Neg Hx    Rectal cancer Neg Hx      Prior to Admission medications   Medication Sig Start Date End Date Taking? Authorizing Provider  aspirin-acetaminophen-caffeine (EXCEDRIN MIGRAINE) [phone] MG tablet Take 1 tablet by mouth every 6 (six) hours as needed for headache.   Yes [provider]  diltiazem (CARDIZEM CD) 120 MG 24 hr capsule TAKE 1 CAPSULE BY MOUTH EVERY DAY 06/17/21  Yes Nafziger, Tommi Rumps, NP  glipiZIDE (GLUCOTROL XL) 10 MG 24 hr tablet  Take 1 tablet (10 mg total) by mouth daily with breakfast. 06/17/21  Yes Nafziger, Tommi Rumps, NP  ibuprofen (ADVIL) 200 MG tablet Take 200 mg by mouth every 6 (six) hours as needed for mild pain.   Yes [provider]  JARDIANCE 25 MG TABS tablet TAKE 1 TABLET (25 MG TOTAL) BY MOUTH DAILY. 05/06/21  Yes Nafziger, Tommi Rumps, NP  metFORMIN (GLUCOPHAGE XR) 500 MG 24 hr tablet Take 2 tablets (1,000 mg total) by mouth in the morning and at bedtime. 06/17/21 09/15/21 Yes Nafziger, Tommi Rumps, NP  Multiple Vitamins-Minerals (CENTRUM SILVER PO) Take 1 tablet by mouth daily.   Yes [provider]  rosuvastatin (CRESTOR) 5 MG tablet Take 1 tablet (5 mg total) by mouth at bedtime. 06/17/21  Yes Nafziger, Tommi Rumps, NP  vitamin B-12 (CYANOCOBALAMIN) 1000 MCG  tablet Take 1 tablet (1,000 mcg total) by mouth daily. 07/16/21  Yes Thurnell Lose, MD  Blood Glucose Monitoring Suppl (ONE TOUCH ULTRA 2) w/Device KIT Use the device daily to check your blood sugars 1-4 times as instructed. 05/19/20   Nafziger, Tommi Rumps, NP  glucose blood (ONE TOUCH ULTRA TEST) test strip USE TO TEST BLOOD GLUCOSE TWICE DAILY 06/17/21   Nafziger, Tommi Rumps, NP  Lancets [facility] ULTRASOFT) lancets Use to check blood sugar BID 06/17/21   [name], NP    Physical Exam: Vitals:   07/20/21 1801 07/20/21 1803 07/20/21 1830 07/20/21 1845  BP: (!) 133/44 (!) 133/44 94/67 125/75  Pulse: (!) 58 79 (!) 59 (!) 48  Resp: '19 18 17 18  ' Temp:      TempSrc:      SpO2:  99% 99% 100%  Weight:      Height:        Constitutional: NAD, calm, comfortable Eyes: PERRL, lids and conjunctivae normal ENMT: Mucous membranes are moist. Posterior pharynx clear of any exudate or lesions.Normal dentition.  Neck: normal, supple, no masses, no thyromegaly Respiratory: clear to auscultation bilaterally, no wheezing, no crackles. Normal respiratory effort. No accessory muscle use.  Cardiovascular: Regular rate and rhythm, no murmurs / rubs / gallops. No extremity edema. 2+ pedal pulses. No carotid bruits.  Abdomen: no tenderness, no masses palpated. No hepatosplenomegaly. Bowel sounds positive.  Musculoskeletal: no clubbing / cyanosis. No joint deformity upper and lower extremities. Good ROM, no contractures. Normal muscle tone.  Skin: no rashes, lesions, ulcers. No induration Neurologic: Mild dysarthria and aphasia. Psychiatric: Alert, oriented to location, day of week, self.   Labs on Admission: I have personally reviewed following labs and imaging studies  CBC: Recent Labs  Lab 07/20/21 1631 07/20/21 1637  WBC 8.9  --   NEUTROABS 5.1  --   HGB 15.6 15.6  HCT 47.1 46.0  MCV 98.5  --   PLT 186  --    Basic Metabolic Panel: Recent Labs  Lab 07/14/21 0202 07/15/21 0436 07/20/21 1631  07/20/21 1637  NA 137 136 138 141  K 4.4 4.0 3.7 3.9  CL 105 102 102 109  CO2 25 24 21*  --   GLUCOSE 125* 137* 137* 135*  BUN '13 14 20 ' 24*  CREATININE 1.06 1.02 1.36* 1.20  CALCIUM 9.1 9.6 10.3  --    GFR: Estimated Creatinine Clearance: 78.8 mL/min (by C-G formula based on SCr of 1.2 mg/dL). Liver Function Tests: Recent Labs  Lab 07/20/21 1631  AST 37  ALT 62*  ALKPHOS 63  BILITOT 0.7  PROT 7.9  ALBUMIN 4.0   No results for input(s): LIPASE, AMYLASE in  the last 168 hours. No results for input(s): AMMONIA in the last 168 hours. Coagulation Profile: Recent Labs  Lab 07/20/21 1631  INR 1.1   Cardiac Enzymes: No results for input(s): CKTOTAL, CKMB, CKMBINDEX, TROPONINI in the last 168 hours. BNP (last 3 results) No results for input(s): PROBNP in the last 8760 hours. HbA1C: No results for input(s): HGBA1C in the last 72 hours. CBG: Recent Labs  Lab 07/14/21 2101 07/15/21 0616 07/15/21 1121 07/20/21 1628 07/20/21 1957  GLUCAP 183* 170* 188* 143* 166*   Lipid Profile: No results for input(s): CHOL, HDL, LDLCALC, TRIG, CHOLHDL, LDLDIRECT in the last 72 hours. Thyroid Function Tests: No results for input(s): TSH, T4TOTAL, FREET4, T3FREE, THYROIDAB in the last 72 hours. Anemia Panel: No results for input(s): VITAMINB12, FOLATE, FERRITIN, TIBC, IRON, RETICCTPCT in the last 72 hours. Urine analysis:    Component Value Date/Time   COLORURINE YELLOW 07/20/2021 2000   APPEARANCEUR CLEAR 07/20/2021 2000   LABSPEC 1.035 (H) 07/20/2021 2000   PHURINE 5.0 07/20/2021 2000   GLUCOSEU >=500 (A) 07/20/2021 2000   HGBUR NEGATIVE 07/20/2021 2000   HGBUR negative 12/27/2010 0801   BILIRUBINUR NEGATIVE 07/20/2021 2000   KETONESUR 20 (A) 07/20/2021 2000   PROTEINUR NEGATIVE 07/20/2021 2000   UROBILINOGEN 0.2 05/24/2011 1003   NITRITE NEGATIVE 07/20/2021 2000   LEUKOCYTESUR NEGATIVE 07/20/2021 2000    Radiological Exams on Admission: MR BRAIN WO CONTRAST  Result Date:  07/20/2021 CLINICAL DATA:  Neuro deficit, acute, stroke suspected EXAM: MRI HEAD WITHOUT CONTRAST TECHNIQUE: Multiplanar, multiecho pulse sequences of the brain and surrounding structures were obtained without intravenous contrast. COMPARISON:  Same day CT head.  MRI July 09, 2021. FINDINGS: Incomplete study. T2 and T1 weighted imaging was not performed. Within this limitation: Brain: No acute infarction, hemorrhage, hydrocephalus, extra-axial collection or mass lesion. Mild scattered T2 hyperintensities within the white matter, which are nonspecific but most likely related to chronic microvascular ischemic disease. Vascular: Not evaluated due to the absence of T2 weighted imaging Skull and upper cervical spine: Normal marrow signal. Sinuses/Orbits: Left maxillary sinus retention cyst. Mild ethmoid air cell mucosal thickening. No acute orbital findings. Other: No sizable mastoid effusions. IMPRESSION: 1. Incomplete study. Within this limitation, no evidence of acute intracranial abnormality. Diffusion-weighted imaging is of good quality and there is no evidence of acute infarct. 2. Mild chronic microvascular ischemic disease. Electronically Signed   By: Margaretha Sheffield MD   On: 07/20/2021 18:02   CT HEAD CODE STROKE WO CONTRAST  Result Date: 07/20/2021 CLINICAL DATA:  Code stroke.  65 year old male EXAM: CT HEAD WITHOUT CONTRAST TECHNIQUE: Contiguous axial images were obtained from the base of the skull through the vertex without intravenous contrast. COMPARISON:  07/10/2021. FINDINGS: Brain: No evidence of acute infarction, hemorrhage, hydrocephalus, extra-axial collection or mass lesion/mass effect. Vascular: No hyperdense vessel or unexpected calcification. Skull: Normal. Negative for fracture or focal lesion. Sinuses/Orbits: Mucous retention cyst in the left maxillary sinus. Mild mucosal thickening in the anterior ethmoid air cells. Other: None. ASPECTS [facility] Stroke Program Early CT Score) - Ganglionic  level infarction (caudate, lentiform nuclei, internal capsule, insula, M1-M3 cortex): 7 - Supraganglionic infarction (M4-M6 cortex): 3 Total score (0-10 with 10 being normal): 10 IMPRESSION: 1. No acute infarct or other acute intracranial process. 2. ASPECTS is 10 Code stroke imaging results were communicated on 07/20/2021 at 4:47 pm to provider Rory Percy via secure text paging. Electronically Signed   By: Merilyn Baba MD   On: 07/20/2021 16:49    EKG:  Independently reviewed.  Assessment/Plan Principal Problem:   Acute encephalopathy Active Problems:   Essential hypertension   Type 2 diabetes mellitus (HCC)   Mollaret's meningitis   Tick bite    Acute encephalopathy - suspicious presentation for possible Mollaret's meningitis, though is HSV neg last admit, and technically would need a 3rd episode to be formally diagnosed as such. Per neurology - Loaded with anti-epileptics, (will defer further dosing to them). EEG and continuous overnight ordered Cont B12 replacement as he was deficient last admit (I ordered 1 time 1010mg IM). Get cytology / cytometry on LP Per ID - Get repeat LP Will see in AM Empiric acyclovir for the moment (sometimes HSV can be false neg on initial LP) Given h/o tick bite - Will add empiric doxy for the moment IR LP ordered: Cell count and diff Culture Will add AFB culture (not sure what his CBG was at time that CSF showed glucose of 66 last time, Hypoglycorrhachia not excluded at this time with elevated protein) Repeat Lyme CSF Cytology and path review Repeat RMSF IGM / IGG Wife concerned about CNS lymphoma given that Pt son has h/o this - I think this probably less likely in this patient though. NPO - IVF = LR at 125 DM2 - Hold home meds Sensitive SSI Q4H HTN - Holding home meds due to NPO status Add a PRN if needed  DVT prophylaxis: SCDs Code Status: Full Family Communication: Wife at bedside Disposition Plan: Home after mental status improved and  diagnosis obtained Consults called: Dr. ARory Percyand Dr. WJuleen ChinaAdmission status: Place in obs - likely to [place] to [place] [facility]  How to contact the THab[facility] Surgery [facility] LLCAttending or Consulting provider [place] covering provider during after hours 7P -7A, for this patient?  Check the care team in [facility] LPand look for a) attending/consulting TRH provider listed and b) the TWestern [facility] Hospitalteam listed Log into [email or web address]  Amion Physician Scheduling and messaging for groups and whole hospitals  On call and physician scheduling software for group practices, residents, hospitalists and other medical providers for call, clinic, rotation and shift schedules. OnCall [employer] is a hospital-wide system for scheduling doctors and paging doctors on call. EasyPlot is for scientific plotting and data analysis.  [email or web address]  and use 's universal password to access. If you do not have the password, please contact the hospital operator.  Locate the THill Regional Hospitalprovider you are looking for under [facility] and page to a number that you can be directly reached. If you still have difficulty reaching the provider, please page the [facility](Director on Call) for the Hospitalists listed on amion for assistance.  07/20/2021, 9:37 PM

## 2021-07-20 NOTE — ED Provider Notes (Signed)
Emergency Department Provider Note   I have reviewed the triage vital signs and the nursing notes.   HISTORY  Chief Complaint Code Stroke   HPI Jason [name] is a 65 y.o. male with PMH reviewed including recent admit for viral meningitis presents to the ED as a CODE STROKE activated by EMS.  This is the patient's 3rd presentation with similar symptoms.  Patient developed headache with some aphasia and confusion.  Symptoms today began at 3:45 PM.  He had an admission last week with similar presentation.  At that time he had LP along with additional neuro testing. He returned to baseline prior to discharge.   Level 5 caveat: aphasia.    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   AMS (altered mental status) 07/10/2021   Marijuana abuse 07/10/2021   Class 1 obesity due to excess calories with body mass index (BMI) of 30.0 to 30.9 in adult 07/10/2021   Routine general medical examination at a health care facility 02/02/2016   Type 2 diabetes mellitus ([place]) 06/12/2012   Syncope - neurally mediated 07/04/2011   Essential hypertension 12/24/2007   HYPERGLYCEMIA 12/24/2007    Past Surgical History:  Procedure Laterality Date   COLONOSCOPY W/ POLYPECTOMY      Allergies Lisinopril and Reglan [metoclopramide]  Family History  Problem Relation Age of Onset   Stroke Mother    Colon cancer Brother    High blood pressure Brother    Stomach cancer Brother    Colon cancer Other        1st degree relative <60   Diabetes Other        1st degree relative    Stroke Other    Colon cancer Sister    High blood pressure Sister    Esophageal cancer Neg Hx    Rectal cancer Neg Hx     Social History Social History   Tobacco Use   Smoking status: Never   Smokeless  tobacco: Never  Vaping Use   Vaping Use: Never used  Substance Use Topics   Alcohol use: Yes    Alcohol/week: 6.0 standard drinks    Types: 6 Cans of beer per week    Comment: on weekends   Drug use: No    Review of Systems  Level 5 caveat: Aphasia.  ____________________________________________   PHYSICAL EXAM:  VITAL SIGNS: Vitals:   07/20/21 1830 07/20/21 1845  BP: 94/67 125/75  Pulse: (!) 59 (!) 48  Resp: 17 18  Temp:    SpO2: 99% 100%    Constitutional: No acute distress.  Eyes: Conjunctivae are normal. PERRL.  Head: Atraumatic. Nose: No congestion/rhinnorhea. Mouth/Throat: Mucous membranes are moist. Neck: No stridor.   Cardiovascular: Bradycardia. Good peripheral circulation. Grossly normal heart sounds.   Respiratory: Normal respiratory effort.  No retractions. Lungs CTAB. Gastrointestinal: Soft and nontender. No distention.  Musculoskeletal: No lower extremity tenderness nor edema. No gross deformities of extremities. Neurologic:  Speech is slightly dysarthric and aphasic. No gross focal neurologic deficits are appreciated. NIHSS: 2 Skin:  Skin is warm, dry and intact. No rash noted.   ____________________________________________   LABS (all labs ordered are listed, but only abnormal results are displayed)  Labs Reviewed  COMPREHENSIVE METABOLIC PANEL - Abnormal; Notable for the following  components:      Result Value   CO2 21 (*)    Glucose, Bld 137 (*)    Creatinine, Ser 1.36 (*)    ALT 62 (*)    GFR, Estimated 57 (*)    All other components within normal limits  I-STAT CHEM 8, ED - Abnormal; Notable for the following components:   BUN 24 (*)    Glucose, Bld 135 (*)    Calcium, Ion 1.11 (*)    All other components within normal limits  CBG MONITORING, ED - Abnormal; Notable for the following components:   Glucose-Capillary 143 (*)    All other components within normal limits  RESP PANEL BY RT-PCR (FLU A&B, COVID) ARPGX2  ETHANOL   PROTIME-INR  APTT  CBC  DIFFERENTIAL  RAPID URINE DRUG SCREEN, HOSP PERFORMED  URINALYSIS, ROUTINE W REFLEX MICROSCOPIC   ____________________________________________  EKG  EKG reviewed. RBBB noted. No acute ischemic change when compared to prior tracing.   ____________________________________________  RADIOLOGY  MR BRAIN WO CONTRAST  Result Date: 07/20/2021 CLINICAL DATA:  Neuro deficit, acute, stroke suspected EXAM: MRI HEAD WITHOUT CONTRAST TECHNIQUE: Multiplanar, multiecho pulse sequences of the brain and surrounding structures were obtained without intravenous contrast. COMPARISON:  Same day CT head.  MRI July 09, 2021. FINDINGS: Incomplete study. T2 and T1 weighted imaging was not performed. Within this limitation: Brain: No acute infarction, hemorrhage, hydrocephalus, extra-axial collection or mass lesion. Mild scattered T2 hyperintensities within the white matter, which are nonspecific but most likely related to chronic microvascular ischemic disease. Vascular: Not evaluated due to the absence of T2 weighted imaging Skull and upper cervical spine: Normal marrow signal. Sinuses/Orbits: Left maxillary sinus retention cyst. Mild ethmoid air cell mucosal thickening. No acute orbital findings. Other: No sizable mastoid effusions. IMPRESSION: 1. Incomplete study. Within this limitation, no evidence of acute intracranial abnormality. Diffusion-weighted imaging is of good quality and there is no evidence of acute infarct. 2. Mild chronic microvascular ischemic disease. Electronically Signed   By: Margaretha Sheffield MD   On: 07/20/2021 18:02   CT HEAD CODE STROKE WO CONTRAST  Result Date: 07/20/2021 CLINICAL DATA:  Code stroke.  65 year old male EXAM: CT HEAD WITHOUT CONTRAST TECHNIQUE: Contiguous axial images were obtained from the base of the skull through the vertex without intravenous contrast. COMPARISON:  07/10/2021. FINDINGS: Brain: No evidence of acute infarction, hemorrhage,  hydrocephalus, extra-axial collection or mass lesion/mass effect. Vascular: No hyperdense vessel or unexpected calcification. Skull: Normal. Negative for fracture or focal lesion. Sinuses/Orbits: Mucous retention cyst in the left maxillary sinus. Mild mucosal thickening in the anterior ethmoid air cells. Other: None. ASPECTS [facility] Stroke Program Early CT Score) - Ganglionic level infarction (caudate, lentiform nuclei, internal capsule, insula, M1-M3 cortex): 7 - Supraganglionic infarction (M4-M6 cortex): 3 Total score (0-10 with 10 being normal): 10 IMPRESSION: 1. No acute infarct or other acute intracranial process. 2. ASPECTS is 10 Code stroke imaging results were communicated on 07/20/2021 at 4:47 pm to provider Rory Percy via secure text paging. Electronically Signed   By: Merilyn Baba MD   On: 07/20/2021 16:49    ____________________________________________   PROCEDURES  Procedure(s) performed:   Procedures  CRITICAL CARE Performed by: Margette Fast Total critical care time: 35 minutes Critical care time was exclusive of separately billable procedures and treating other patients. Critical care was necessary to treat or prevent imminent or life-threatening deterioration. Critical care was time spent personally by me on the following activities: development of treatment plan with patient and/or  surrogate as well as nursing, discussions with consultants, evaluation of patient's response to treatment, examination of patient, obtaining history from patient or surrogate, ordering and performing treatments and interventions, ordering and review of laboratory studies, ordering and review of radiographic studies, pulse oximetry and re-evaluation of patient's condition.  Nanda Quinton, MD Emergency Medicine  ____________________________________________   INITIAL IMPRESSION / ASSESSMENT AND PLAN / ED COURSE  Pertinent labs & imaging results that were available during my care of the patient were  reviewed by me and considered in my medical decision making (see chart for details).   Patient arrives as a code stroke with acute onset dysarthria, confusion, aphasia.  He has some associated headache.  Similar presentation last week with extensive work-up including LP.  Patient evaluated immediately by neurology and taken to CT.  No hemorrhage or other acute change on CT imaging of the head.  Patient taken straight for MRI.  Study is limited but no acute stroke or other findings.  Labs and imaging reviewed.  Discussed the case with neurology regarding their recommendations.  Patient will be loaded with Keppra and given a migraine cocktail.  Updated patient and family at bedside regarding admit.   Discussed patient's case with [facility] to request admission. Patient and family (if present) updated with plan. Care transferred to [facility] service.  I reviewed all nursing notes, vitals, pertinent old records, EKGs, labs, imaging (as available).    ____________________________________________  FINAL CLINICAL IMPRESSION(S) / ED DIAGNOSES  Final diagnoses:  Dysarthria  Acute nonintractable headache, unspecified headache type  Aphasia     MEDICATIONS GIVEN DURING THIS VISIT:  Medications  levETIRAcetam (KEPPRA) 3,000 mg in sodium chloride 0.9 % 250 mL IVPB (0 mg Intravenous Stopped 07/20/21 1822)  ketorolac (TORADOL) 15 MG/ML injection 15 mg (15 mg Intravenous Given 07/20/21 1727)  prochlorperazine (COMPAZINE) injection 10 mg (10 mg Intravenous Given 07/20/21 1727)  diphenhydrAMINE (BENADRYL) injection 25 mg (25 mg Intravenous Given 07/20/21 1727)     Note:  This document was prepared using Dragon voice recognition software and may include unintentional dictation errors.  Nanda Quinton, MD, [facility] Emergency Medicine    Dionel Archey, Wonda [facility], MD 07/20/21 [place]

## 2021-07-21 ENCOUNTER — Observation Stay (HOSPITAL_COMMUNITY): Payer: Medicare Other

## 2021-07-21 ENCOUNTER — Encounter (HOSPITAL_COMMUNITY): Payer: Self-pay | Admitting: Internal Medicine

## 2021-07-21 ENCOUNTER — Other Ambulatory Visit: Payer: Self-pay

## 2021-07-21 DIAGNOSIS — Z6829 Body mass index (BMI) 29.0-29.9, adult: Secondary | ICD-10-CM | POA: Diagnosis not present

## 2021-07-21 DIAGNOSIS — E663 Overweight: Secondary | ICD-10-CM | POA: Diagnosis present

## 2021-07-21 DIAGNOSIS — E119 Type 2 diabetes mellitus without complications: Secondary | ICD-10-CM | POA: Diagnosis present

## 2021-07-21 DIAGNOSIS — E785 Hyperlipidemia, unspecified: Secondary | ICD-10-CM | POA: Diagnosis present

## 2021-07-21 DIAGNOSIS — E538 Deficiency of other specified B group vitamins: Secondary | ICD-10-CM | POA: Diagnosis present

## 2021-07-21 DIAGNOSIS — Z7984 Long term (current) use of oral hypoglycemic drugs: Secondary | ICD-10-CM | POA: Diagnosis not present

## 2021-07-21 DIAGNOSIS — G9340 Encephalopathy, unspecified: Secondary | ICD-10-CM | POA: Diagnosis present

## 2021-07-21 DIAGNOSIS — W57XXXA Bitten or stung by nonvenomous insect and other nonvenomous arthropods, initial encounter: Secondary | ICD-10-CM | POA: Diagnosis present

## 2021-07-21 DIAGNOSIS — G032 Benign recurrent meningitis [Mollaret]: Secondary | ICD-10-CM | POA: Diagnosis not present

## 2021-07-21 DIAGNOSIS — I10 Essential (primary) hypertension: Secondary | ICD-10-CM | POA: Diagnosis present

## 2021-07-21 DIAGNOSIS — R4701 Aphasia: Secondary | ICD-10-CM | POA: Diagnosis present

## 2021-07-21 DIAGNOSIS — M79602 Pain in left arm: Secondary | ICD-10-CM | POA: Diagnosis not present

## 2021-07-21 DIAGNOSIS — Z20822 Contact with and (suspected) exposure to covid-19: Secondary | ICD-10-CM | POA: Diagnosis present

## 2021-07-21 DIAGNOSIS — G9341 Metabolic encephalopathy: Secondary | ICD-10-CM | POA: Diagnosis present

## 2021-07-21 DIAGNOSIS — D729 Disorder of white blood cells, unspecified: Secondary | ICD-10-CM | POA: Diagnosis not present

## 2021-07-21 DIAGNOSIS — F29 Unspecified psychosis not due to a substance or known physiological condition: Secondary | ICD-10-CM | POA: Diagnosis not present

## 2021-07-21 DIAGNOSIS — R471 Dysarthria and anarthria: Secondary | ICD-10-CM | POA: Diagnosis present

## 2021-07-21 DIAGNOSIS — I808 Phlebitis and thrombophlebitis of other sites: Secondary | ICD-10-CM | POA: Diagnosis present

## 2021-07-21 DIAGNOSIS — Z79899 Other long term (current) drug therapy: Secondary | ICD-10-CM | POA: Diagnosis not present

## 2021-07-21 DIAGNOSIS — G039 Meningitis, unspecified: Secondary | ICD-10-CM | POA: Diagnosis not present

## 2021-07-21 DIAGNOSIS — W57XXXD Bitten or stung by nonvenomous insect and other nonvenomous arthropods, subsequent encounter: Secondary | ICD-10-CM | POA: Diagnosis not present

## 2021-07-21 DIAGNOSIS — Z781 Physical restraint status: Secondary | ICD-10-CM | POA: Diagnosis not present

## 2021-07-21 DIAGNOSIS — Z833 Family history of diabetes mellitus: Secondary | ICD-10-CM | POA: Diagnosis not present

## 2021-07-21 DIAGNOSIS — N179 Acute kidney failure, unspecified: Secondary | ICD-10-CM | POA: Diagnosis present

## 2021-07-21 DIAGNOSIS — Z888 Allergy status to other drugs, medicaments and biological substances status: Secondary | ICD-10-CM | POA: Diagnosis not present

## 2021-07-21 DIAGNOSIS — R7401 Elevation of levels of liver transaminase levels: Secondary | ICD-10-CM | POA: Diagnosis present

## 2021-07-21 DIAGNOSIS — G030 Nonpyogenic meningitis: Secondary | ICD-10-CM | POA: Diagnosis present

## 2021-07-21 DIAGNOSIS — T148XXA Other injury of unspecified body region, initial encounter: Secondary | ICD-10-CM | POA: Diagnosis present

## 2021-07-21 LAB — MISC LABCORP TEST (SEND OUT)
LabCorp test name: 2013305
Labcorp test code: 2013305

## 2021-07-21 LAB — CSF CELL COUNT WITH DIFFERENTIAL
Eosinophils, CSF: 0 % (ref 0–1)
Lymphs, CSF: 96 % — ABNORMAL HIGH (ref 40–80)
Monocyte-Macrophage-Spinal Fluid: 4 % — ABNORMAL LOW (ref 15–45)
RBC Count, CSF: 26 /mm3 — ABNORMAL HIGH
Segmented Neutrophils-CSF: 0 % (ref 0–6)
Tube #: 3
WBC, CSF: 105 /mm3 (ref 0–5)

## 2021-07-21 LAB — CBC
HCT: 45.3 % (ref 39.0–52.0)
Hemoglobin: 15.2 g/dL (ref 13.0–17.0)
MCH: 32.9 pg (ref 26.0–34.0)
MCHC: 33.6 g/dL (ref 30.0–36.0)
MCV: 98.1 fL (ref 80.0–100.0)
Platelets: 185 10*3/uL (ref 150–400)
RBC: 4.62 MIL/uL (ref 4.22–5.81)
RDW: 14.1 % (ref 11.5–15.5)
WBC: 8.6 10*3/uL (ref 4.0–10.5)
nRBC: 0 % (ref 0.0–0.2)

## 2021-07-21 LAB — BASIC METABOLIC PANEL
Anion gap: 11 (ref 5–15)
BUN: 20 mg/dL (ref 8–23)
CO2: 22 mmol/L (ref 22–32)
Calcium: 9.9 mg/dL (ref 8.9–10.3)
Chloride: 105 mmol/L (ref 98–111)
Creatinine, Ser: 1.21 mg/dL (ref 0.61–1.24)
GFR, Estimated: >60 mL/min (ref >60)
Glucose, Bld: 103 mg/dL — ABNORMAL HIGH (ref 70–99)
Potassium: 4.7 mmol/L (ref 3.5–5.1)
Sodium: 138 mmol/L (ref 135–145)

## 2021-07-21 LAB — PROTEIN, CSF: Total  Protein, CSF: 80 mg/dL — ABNORMAL HIGH (ref 15–45)

## 2021-07-21 LAB — GLUCOSE, CAPILLARY
Glucose-Capillary: 101 mg/dL — ABNORMAL HIGH (ref 70–99)
Glucose-Capillary: 139 mg/dL — ABNORMAL HIGH (ref 70–99)
Glucose-Capillary: 175 mg/dL — ABNORMAL HIGH (ref 70–99)
Glucose-Capillary: 96 mg/dL (ref 70–99)

## 2021-07-21 LAB — CRYPTOCOCCAL ANTIGEN: Crypto Ag: NEGATIVE

## 2021-07-21 LAB — GLUCOSE, CSF: Glucose, CSF: 53 mg/dL (ref 40–70)

## 2021-07-21 LAB — CBG MONITORING, ED: Glucose-Capillary: 75 mg/dL (ref 70–99)

## 2021-07-21 MED ORDER — LIDOCAINE HCL (PF) 1 % IJ SOLN
5.0000 mL | Freq: Once | INTRAMUSCULAR | Status: AC
  Administered 2021-07-21: 2 mL via INTRADERMAL

## 2021-07-21 NOTE — Consult Note (Signed)
[facility] for Infectious Disease    Date of Admission:  07/20/2021     Reason for Consult: csf pleocytosis    Referring Provider: Alfredia Ferguson    Abx: 8/9-c acyclovir  8/9-10 doxy        Assessment: 65 yo male with headache/confusion syndrome the last week x2 with initial csf lymphocytic leukocytosis   Unclear what is causing his csf leukocytosis. Doesn't quite act like an infectious process especially this time with quick resolution of sx and lacking sepsis criteria or subjective f/c otherwise  Previous csf 7/30 with mild pleocysosis but negative hsv pcr/cx and normal protein/glucose  Previous hiv screen negative and bcx negative 7/30  We can be complete and send csf pcr panel and cryptococcal ag   He has no syndrome of tick borne disease  Plan: Ok to stop doxy Continue acyclovir for now Check serum cryptococcal ag Sent out order for csf meningoencephalitis pcr Discussed with primary team  I spent 60 minute reviewing data/chart, and coordinating care and >50% direct face to face time providing counseling/discussing diagnostics/treatment plan with patient       ------------------------------------------------ Principal Problem:   Acute encephalopathy Active Problems:   Essential hypertension   Type 2 diabetes mellitus ([place])   Mollaret's meningitis   Tick bite    HPI: KETRON MCCLAMMY is a 65 y.o. male nonsmoker, dm2, htn/hlp admitted 8/9 for recurrent headache/confusion after similar admission a week prior   Patient never had these episodes prior  I reviewed note and discussed with him/his wife regarding presentation  He has acute onset confusion/headache and was admitted 7/29. No sepsis prodrome or documentation of such during admission. Had LP which showed 165 wbc lyphocytic predominance, normal protein/glucose. Negative culture, hsv pcr. On bsAbx/acyclovir but d/c'ed once csf analysis negative. Sx resolved during admission  He did well  until 8/9 when he was working outside in hot weather on his car with his son. When his wife found him later he appears slightly confused/staring blank and complaining of headache. So he was brought to hospital again. No sepsis prodrome/documentation so far  He said his headache already resolved by the time I see him today Normal routine labs. No fever  He is on doxy/acyclovir  No rash, joint pain, n/v/diarrhea  His wife reports his son was dx'ed with some kind of brain cancer in his 81s  No new medications  Take excedrine migraine prn for intermittent headache    Family History  Problem Relation Age of Onset   Stroke Mother    Colon cancer Sister    High blood pressure Sister    Colon cancer Brother    High blood pressure Brother    Stomach cancer Brother    Lymphoma Son    Colon cancer Other        1st degree relative <60   Diabetes Other        1st degree relative    Stroke Other    Esophageal cancer Neg Hx    Rectal cancer Neg Hx     Social History   Tobacco Use   Smoking status: Never   Smokeless tobacco: Never  Vaping Use   Vaping Use: Never used  Substance Use Topics   Alcohol use: Yes    Alcohol/week: 6.0 standard drinks    Types: 6 Cans of beer per week    Comment: on weekends   Drug use: No    Allergies  Allergen Reactions  Lisinopril     hyperkalemia   Reglan [Metoclopramide] Anxiety    Patient had bad reaction requiring ativan in addition to benadryl. He doesn't want to receive it any more    Review of Systems: ROS All Other ROS was negative, except mentioned above   Past Medical History:  Diagnosis Date   Bell palsy    Diabetes mellitus without complication (HCC)    HTN (hypertension)        Scheduled Meds:  cyanocobalamin  1,000 mcg Intramuscular Once   insulin aspart  0-9 Units Subcutaneous Q4H   Continuous Infusions:  acyclovir 170 mL/hr at 07/21/21 0959   lactated ringers 125 mL/hr at 07/20/21 2249   PRN  Meds:.acetaminophen **OR** acetaminophen, ondansetron **OR** ondansetron (ZOFRAN) IV   OBJECTIVE: Blood pressure 115/73, pulse 65, temperature 98.2 F (36.8 C), temperature source Oral, resp. rate 18, height '6\' 3"'$  (1.905 m), weight 106.4 kg, SpO2 100 %.  Physical Exam General/constitutional: no distress, pleasant HEENT: Normocephalic, PER, Conj Clear, EOMI, Oropharynx clear Neck supple CV: rrr no mrg Lungs: clear to auscultation, normal respiratory effort Abd: Soft, Nontender Ext: no edema Skin: No Rash Neuro: nonfocal MSK: no peripheral joint swelling/tenderness/warmth; back spines nontender Psych alert/oriented  Central line presence: no    Lab Results Lab Results  Component Value Date   WBC 8.6 07/21/2021   HGB 15.2 07/21/2021   HCT 45.3 07/21/2021   MCV 98.1 07/21/2021   PLT 185 07/21/2021    Lab Results  Component Value Date   CREATININE 1.21 07/21/2021   BUN 20 07/21/2021   NA 138 07/21/2021   K 4.7 07/21/2021   CL 105 07/21/2021   CO2 22 07/21/2021    Lab Results  Component Value Date   ALT 62 (H) 07/20/2021   AST 37 07/20/2021   ALKPHOS 63 07/20/2021   BILITOT 0.7 07/20/2021      Microbiology: Recent Results (from the past 240 hour(s))  CSF culture w Gram Stain     Status: None   Collection Time: 07/12/21  9:54 AM   Specimen: PATH Cytology CSF; Cerebrospinal Fluid  Result Value Ref Range Status   Specimen Description CSF  Final   Special Requests NONE  Final   Gram Stain   Final    WBC PRESENT,BOTH PMN AND MONONUCLEAR NO ORGANISMS SEEN CYTOSPIN SMEAR    Culture   Final    NO GROWTH 3 DAYS Performed at [facility], [address], [place], [place] [address]    Report Status 07/15/2021 FINAL  Final  Culture, fungus without smear     Status: None (Preliminary result)   Collection Time: 07/12/21  9:54 AM   Specimen: PATH Cytology CSF; Cerebrospinal Fluid  Result Value Ref Range Status   Specimen Description CSF  Final   Special  Requests NONE  Final   Culture   Final    NO FUNGUS ISOLATED AFTER 8 DAYS Performed at [facility], [place] [address] [place],  [address]    Report Status PENDING  Incomplete  Resp Panel by RT-PCR (Flu A&B, Covid) Nasopharyngeal Swab     Status: None   Collection Time: 07/20/21  5:09 PM   Specimen: Nasopharyngeal Swab; Nasopharyngeal(NP) swabs in vial transport medium  Result Value Ref Range Status   SARS Coronavirus 2 by RT PCR NEGATIVE NEGATIVE Final    Comment: (NOTE) SARS-CoV-2 target nucleic acids are NOT DETECTED.  The SARS-CoV-2 RNA is generally detectable in upper respiratory specimens during the acute phase  of infection. The lowest concentration of SARS-CoV-2 viral copies this assay can detect is 138 copies/mL. A negative result does not preclude SARS-Cov-2 infection and should not be used as the sole basis for treatment or other patient management decisions. A negative result may occur with  improper specimen collection/handling, submission of specimen other than nasopharyngeal swab, presence of viral mutation(s) within the areas targeted by this assay, and inadequate number of viral copies(<138 copies/mL). A negative result must be combined with clinical observations, patient history, and epidemiological information. The expected result is Negative.  Fact Sheet for Patients:  EntrepreneurPulse.com.au  Fact Sheet for Healthcare Providers:  IncredibleEmployment.be  This test is no t yet approved or cleared by the [place] FDA and  has been authorized for detection and/or diagnosis of SARS-CoV-2 by FDA under an Emergency Use Authorization (EUA). This EUA will remain  in effect (meaning this test can be used) for the duration of the COVID-19 declaration under Section 564(b)(1) of the Act, 21 U.S.C.section 360bbb-3(b)(1), unless the authorization is terminated  or revoked sooner.       Influenza A by PCR NEGATIVE  NEGATIVE Final   Influenza B by PCR NEGATIVE NEGATIVE Final    Comment: (NOTE) The Xpert Xpress SARS-CoV-2/FLU/RSV plus assay is intended as an aid in the diagnosis of influenza from Nasopharyngeal swab specimens and should not be used as a sole basis for treatment. Nasal washings and aspirates are unacceptable for Xpert Xpress SARS-CoV-2/FLU/RSV testing.  Fact Sheet for Patients: EntrepreneurPulse.com.au  Fact Sheet for Healthcare Providers: IncredibleEmployment.be  This test is not yet approved or cleared by the [place] FDA and has been authorized for detection and/or diagnosis of SARS-CoV-2 by FDA under an Emergency Use Authorization (EUA). This EUA will remain in effect (meaning this test can be used) for the duration of the COVID-19 declaration under Section 564(b)(1) of the Act, 21 U.S.C. section 360bbb-3(b)(1), unless the authorization is terminated or revoked.  Performed at [facility], [place] [address], [place],  [address]      Serology:    Imaging: If present, new imagings (plain films, ct scans, and mri) have been personally visualized and interpreted; radiology reports have been reviewed. Decision making incorporated into the Impression / Recommendations.  8/9 mri brain without contrast Incomplete study. T2 and T1 weighted imaging was not performed. Within this limitation, no evidence of acute intracranial abnormality. Diffusion-weighted imaging is of good quality and there is no evidence of acute infarct. 2. Mild chronic microvascular ischemic disease.  Jabier Mutton, [facility] [place] [phone] pager    07/21/2021, 11:49 AM

## 2021-07-21 NOTE — Procedures (Signed)
Patient Name: Jason [name]  MRN: [id number]  Epilepsy Attending: Lora Havens  Referring Physician/Provider: Dr. Amie Portland Date: 07/21/2021 Duration: 24.25 mins  Patient history: 65 year old male who presented with aphasia and confusion.  EEG to evaluate for seizures.  Level of alertness: Awake, asleep  AEDs during EEG study: None  Technical aspects: This EEG study was done with scalp electrodes positioned according to the 10-20 International system of electrode placement. Electrical activity was acquired at a sampling rate of '500Hz'$  and reviewed with a high frequency filter of '70Hz'$  and a low frequency filter of '1Hz'$ . EEG data were recorded continuously and digitally stored.   Description: The posterior dominant rhythm consists of 8-9 Hz activity of moderate voltage (25-35 uV) seen predominantly in posterior head regions, symmetric and reactive to eye opening and eye closing. Sleep was characterized by vertex waves, sleep spindles (12 to 14 Hz), maximal frontocentral region. Physiologic photic driving was not seen during photic stimulation. Hyperventilation was not performed.     IMPRESSION: This study is within normal limits. No seizures or epileptiform discharges were seen throughout the recording.  Shanielle Correll Barbra Sarks

## 2021-07-21 NOTE — ED Notes (Signed)
Patient transported to X-ray 

## 2021-07-21 NOTE — Progress Notes (Addendum)
Neurology Progress Note   S:// Seen and examined. No complaints at this time    O:// Current vital signs: BP 115/73   Pulse 65   Temp 98.2 F (36.8 C) (Oral)   Resp 18   Ht '6\' 3"'$  (1.905 m)   Wt 106.4 kg   SpO2 100%   BMI 29.32 kg/m  Vital signs in last 24 hours: Temp:  [97.5 F (36.4 C)-98.2 F (36.8 C)] 98.2 F (36.8 C) (08/10 1124) Pulse Rate:  [48-88] 65 (08/10 1124) Resp:  [11-29] 18 (08/10 1124) BP: (94-153)/(44-82) 115/73 (08/10 1124) SpO2:  [96 %-100 %] 100 % (08/10 1124) Weight:  [106.4 kg] 106.4 kg (08/09 1632) GENERAL: Awake, alert in NAD HEENT: - Normocephalic and atraumatic, dry mm, no LN++, no Thyromegally LUNGS - Clear to auscultation bilaterally with no wheezes CV - S1S2 RRR, no m/r/g, equal pulses bilaterally. ABDOMEN - Soft, nontender, nondistended with normoactive BS Ext: warm, well perfused, intact peripheral pulses, no edema NEURO:  Mental Status: AA&Ox3  Language: speech is clear.  Naming, repetition, fluency, and comprehension intact. Cranial Nerves: PERRL EOMI, visual fields full, no facial asymmetry, facial sensation intact, hearing intact, tongue/uvula/soft palate midline, normal sternocleidomastoid and trapezius muscle strength. No evidence of tongue atrophy or fibrillations Motor: 5/5 in all 4s Tone: is normal and bulk is normal Sensation- Intact to light touch bilaterally Coordination: FTN intact bilaterally, no ataxia in BLE. Gait- deferred  Medications  Current Facility-Administered Medications:    acetaminophen (TYLENOL) tablet 650 mg, 650 mg, Oral, Q6H PRN **OR** acetaminophen (TYLENOL) suppository 650 mg, 650 mg, Rectal, Q6H PRN, Alcario Drought, Jared M, DO   acyclovir (ZOVIRAX) 1,000 mg in dextrose 5 % 150 mL IVPB, 1,000 mg, Intravenous, Q8H, Heloise Purpura, RPH, Last Rate: 170 mL/hr at 07/21/21 [id number], Restarted at 07/21/21 0959   cyanocobalamin ((VITAMIN B-12)) injection 1,000 mcg, 1,000 mcg, Intramuscular, Once, Alcario Drought, Jared M,  DO   insulin aspart (novoLOG) injection 0-9 Units, 0-9 Units, Subcutaneous, Q4H, Alcario Drought, Jared M, DO   lactated ringers infusion, , Intravenous, Continuous, Etta Quill, DO, Last Rate: 125 mL/hr at 07/20/21 2249, New Bag at 07/20/21 2249   ondansetron (ZOFRAN) tablet 4 mg, 4 mg, Oral, Q6H PRN **OR** ondansetron (ZOFRAN) injection 4 mg, 4 mg, Intravenous, Q6H PRN, Etta Quill, DO Labs CBC    Component Value Date/Time   WBC 8.6 07/21/2021 0050   RBC 4.62 07/21/2021 0050   HGB 15.2 07/21/2021 0050   HCT 45.3 07/21/2021 0050   PLT 185 07/21/2021 0050   MCV 98.1 07/21/2021 0050   MCH 32.9 07/21/2021 0050   MCHC 33.6 07/21/2021 0050   RDW 14.1 07/21/2021 0050   LYMPHSABS 3.0 07/20/2021 1631   MONOABS 0.6 07/20/2021 1631   EOSABS 0.2 07/20/2021 1631   BASOSABS 0.1 07/20/2021 1631    CMP     Component Value Date/Time   NA 138 07/21/2021 0050   K 4.7 07/21/2021 0050   CL 105 07/21/2021 0050   CO2 22 07/21/2021 0050   GLUCOSE 103 (H) 07/21/2021 0050   BUN 20 07/21/2021 0050   CREATININE 1.21 07/21/2021 0050   CALCIUM 9.9 07/21/2021 0050   PROT 7.9 07/20/2021 1631   ALBUMIN 4.0 07/20/2021 1631   AST 37 07/20/2021 1631   ALT 62 (H) 07/20/2021 1631   ALKPHOS 63 07/20/2021 1631   BILITOT 0.7 07/20/2021 1631   GFRNONAA >60 07/21/2021 0050   GFRAA >60 05/25/2011 0449   Routine EEG-normal  Imaging I have reviewed images in  epic and the results pertinent to this consultation are: MRI brain limited-no stroke  Assessment: 65 year old man presenting for aphasia and confusion-stereotypical episodes-previously seen for this last week and diagnosed with aseptic meningitis.  Stat MRI negative for stroke. Continues to complain of intermittent headaches and spells of confusion. Underwent a spinal tap with CSF revealing 26 red cells, 105 WBCs of which 96% and lymphocytes 4% monocytes 0% segmented neutrophils, total protein of 80, glucose of 53. This is very similar to the previous  spinal tap with WBC count of 165 with 98% lymphocytes and a total protein of 81. Suspect aseptic meningitis.  Could be recurrent aseptic meningitis/Mollarets meningitis ID has been consulted-appreciate involvement. Due to stereotypic nature-seizure should also be considered in differentials  Recommendations: Routine EEG unremarkable-continue LTM-if negative, will discontinue tomorrow. Continue acyclovir for empiric HSV meningitis coverage ID has sent meningeal encephalitis panel that includes 7 bacteria and 7 viruses-it is a send out test and will take some time to come back.  Cryptococcal antigen also ordered. No need for bacterial meningitic coverage based on the CSF findings. Symptomatic treatment of headache as needed Continue B12 replenishment EEG was normal-unless the LTM EEG shows abnormality, will not use antiepileptics Plan discussed with the primary team as well as ID attending.  -- Amie Portland, MD Neurologist [facility] Pager: [phone]

## 2021-07-21 NOTE — Progress Notes (Signed)
PROGRESS NOTE    Jason [name]  [id number] DOB: Apr 02, 1956 DOA: 07/20/2021 PCP: [name], NP   Brief Narrative:  The patient is a 65 year old overweight African-American male with past medical history significant for but not limited to hypertension, diabetes mellitus type 2 who presented a couple weeks ago with a tick bite prior to his onset of symptoms.  He initially presented to the ED on 07/09/2021 -07/10/2021 with a 1 week history of headache progressing to confusion.  He had no reported fevers or chills and he was admitted to hospitalist service on 07/10/2021 until 07/15/2021 and work-up at that time was suggestive of aseptic/viral meningitis.  Initial CSF findings at that time was under 65 WBCs with 90% lymphs and he did have 81 protein in 60s of glucose at that time.  Cultures were negative and it is 1B and 2 CF cephalonegative.  Lyme CSF was negative and arm SF peripheral IgM were negative.  Initially was placed on broad-spectrum antibiotics with acyclovir and then just tapered acyclovir and then ultimately taken off for his HSV came back negative.  Following his discharge he improved significantly but then had relapses with symptoms and had significant headache yesterday with no meningismus.  He had altered mental status and had word finding difficulty around 3 PM yesterday.  He returned to the ED for recurrent symptoms and denies any meningismus.  States his headache is now mild but he did have AMS and lethargy and did wake up to answer questions.  In the ED he is afebrile with no evidence of SIRS criteria.  Neurology was consulted and initially concern for seizure so he was loaded with Keppra and he is given a migraine cocktail.  He underwent MRI testing which was unable to get T1-2 images but DWI were negative for acute stroke.  He was admitted for his acute encephalopathy and neurology recommended repeating a lumbar puncture and this was done by interventional radiology.  He underwent  EEG monitoring which showed the study was within normal limits and no seizures or epileptiform discharges were seen throughout the recording.  Given his symptoms infectious diseases was also consulted and they recommended stopping doxycycline for now and continue acyclovir and checking a serum cryptococcal antigen as well as sending orders for CSF meningeal encephalitis PCR.  Because of his recurrence of symptoms neurology feels that he has aseptic meningitis that could be recurrent or could be moderate meningitis.  ID has been consulted and made the recommendations and because of his stereotactic nature seizures are also included in differential.  Neurology recommending LTM and if negative discontinue tomorrow  Assessment & Plan:   Principal Problem:   Acute encephalopathy Active Problems:   Essential hypertension   Type 2 diabetes mellitus (HCC)   Mollaret's meningitis   Tick bite   Encephalopathy acute  Acute encephalopathy with Headache and periods of aphasia and confusion -Presentation is suspicious for recurrent aseptic meningitis versus moderates meningitis -HSV is negative on aspiration -Neurology has loaded the patient with antiepileptics and obtaining an EEG and LTM and if LTM is normal overnight will discontinue morning -He is continuing to get B12 replacement -Obtain another LP and cytology and repeat spinal tap showed a CSF of 26 red cells, 105 WBCs which are 96% lymphocytes, 4% monocytes and 0% segmented neutrophils, total protein of 80 and a glucose of 53 which was similar to last week's presentation -Infectious diseases has been consulted and recommending continuing empiric acyclovir and stopping the empiric doxycycline for his history  of tick bite -Labs have been sent out and he is going to get a cell count and differential as well as the labs ordered by infectious diseases - neurology recommends no need for bacterial meningitic coverage based on his CSF findings and continuing  symptomatic treatment of his headache as needed -Currently getting IV fluids LR at 125 mils per hour and he was initially n.p.o. but will give him a diet -Given Levetiracetam 3000 mg x1 in the ED   Diabetes mellitus type 2 -Continue to hold home medical -Continue with sensitive sliding scale insulin every 4 and changed before meals and at bedtime in the morning -Continue monitor blood sugars and ranging from 75-175  Hypertension -Continue to monitor blood pressures per protocol -Last blood pressure was 137/73 -If necessary will place on as needed hydralazine  Overweight -Estimated body mass index is 29.32 kg/m as calculated from the following:   Height as of this encounter: '6\' 3"'$  (1.905 m).   Weight as of this encounter: 106.4 kg. -Weight loss and dietary counseling given  DVT prophylaxis: SCDs Code Status: FULL CODE Family Communication: Discussed with wife at bedside  Disposition Plan: Pending further clinical improvement by specialists  Status is: Inpatient  Remains inpatient appropriate because:Unsafe d/c plan, IV treatments appropriate due to intensity of illness or inability to take PO, and Inpatient level of care appropriate due to severity of illness  Dispo: The patient is from: Home              Anticipated d/c is to: Home              Patient currently is not medically stable to d/c.   Difficult to place patient No  Consultants:  ID Neurology  Procedures: Lumbar Puncture  Antimicrobials:  Anti-infectives (From admission, onward)    Start     Dose/Rate Route Frequency Ordered Stop   07/20/21 2200  acyclovir (ZOVIRAX) 1,000 mg in dextrose 5 % 150 mL IVPB        1,000 mg 170 mL/hr over 60 Minutes Intravenous Every 8 hours 07/20/21 2029     07/20/21 2115  doxycycline (VIBRAMYCIN) 100 mg in sodium chloride 0.9 % 250 mL IVPB  Status:  Discontinued        100 mg 125 mL/hr over 120 Minutes Intravenous Every 12 hours 07/20/21 2107 07/21/21 1039         Subjective: Seen and examined at bedside he is getting his EEG done.  Denied any headaches now but had 1 yesterday.  Feels okay.  No chest pain or shortness of breath.  Is alert and oriented x3 currently.  No other concerns or complaints at this time.  Objective: Vitals:   07/21/21 0330 07/21/21 0646 07/21/21 1124 07/21/21 1543  BP: 117/80 133/82 115/73 137/73  Pulse: 71 79 65 73  Resp: '19 17 18 16  '$ Temp:  97.9 F (36.6 C) 98.2 F (36.8 C) 98.5 F (36.9 C)  TempSrc:   Oral Oral  SpO2: 98% 97% 100% 98%  Weight:      Height:        Intake/Output Summary (Last 24 hours) at 07/21/2021 1728 Last data filed at 07/21/2021 0249 Gross per 24 hour  Intake 312.06 ml  Output --  Net 312.06 ml   Filed Weights   07/20/21 1632  Weight: 106.4 kg   Examination: Physical Exam:  Constitutional: WN/WD overweight African-American male currently in NAD and appears calm and comfortable Eyes: Lids and conjunctivae normal, sclerae anicteric  ENMT:  External Ears, Nose appear normal. Grossly normal hearing.  Neck: Appears normal, supple, no cervical masses, normal ROM, no appreciable thyromegaly; no JVD Respiratory: Diminished to auscultation bilaterally, no wheezing, rales, rhonchi or crackles. Normal respiratory effort and patient is not tachypenic. No accessory muscle use.  Cardiovascular: RRR, no murmurs / rubs / gallops. S1 and S2 auscultated.  Trace extremity edema Abdomen: Soft, non-tender, distended secondary body habitus.  Bowel sounds positive.  GU: Deferred. Musculoskeletal: No clubbing / cyanosis of digits/nails. No joint deformity upper and lower extremities.  Skin: No rashes, lesions, ulcers on limited skin evaluation. No induration; Warm and dry.  Neurologic: CN 2-12 grossly intact with no focal deficits. Romberg sign cerebellar reflexes not assessed.  Psychiatric: Normal judgment and insight. Alert and oriented x 3. Normal mood and appropriate affect.   Data Reviewed: I have  personally reviewed following labs and imaging studies  CBC: Recent Labs  Lab 07/20/21 1631 07/20/21 1637 07/21/21 0050  WBC 8.9  --  8.6  NEUTROABS 5.1  --   --   HGB 15.6 15.6 15.2  HCT 47.1 46.0 45.3  MCV 98.5  --  98.1  PLT 186  --  [id number]   Basic Metabolic Panel: Recent Labs  Lab 07/15/21 0436 07/20/21 1631 07/20/21 1637 07/21/21 0050  NA 136 138 141 138  K 4.0 3.7 3.9 4.7  CL 102 102 109 105  CO2 24 21*  --  22  GLUCOSE 137* 137* 135* 103*  BUN 14 20 24* 20  CREATININE 1.02 1.36* 1.20 1.21  CALCIUM 9.6 10.3  --  9.9   GFR: Estimated Creatinine Clearance: 78.2 mL/min (by C-G formula based on SCr of 1.21 mg/dL). Liver Function Tests: Recent Labs  Lab 07/20/21 1631  AST 37  ALT 62*  ALKPHOS 63  BILITOT 0.7  PROT 7.9  ALBUMIN 4.0   No results for input(s): LIPASE, AMYLASE in the last 168 hours. No results for input(s): AMMONIA in the last 168 hours. Coagulation Profile: Recent Labs  Lab 07/20/21 1631  INR 1.1   Cardiac Enzymes: No results for input(s): CKTOTAL, CKMB, CKMBINDEX, TROPONINI in the last 168 hours. BNP (last 3 results) No results for input(s): PROBNP in the last 8760 hours. HbA1C: No results for input(s): HGBA1C in the last 72 hours. CBG: Recent Labs  Lab 07/20/21 1628 07/20/21 1957 07/21/21 0736 07/21/21 1136 07/21/21 1555  GLUCAP 143* 166* 75 101* 175*   Lipid Profile: No results for input(s): CHOL, HDL, LDLCALC, TRIG, CHOLHDL, LDLDIRECT in the last 72 hours. Thyroid Function Tests: No results for input(s): TSH, T4TOTAL, FREET4, T3FREE, THYROIDAB in the last 72 hours. Anemia Panel: No results for input(s): VITAMINB12, FOLATE, FERRITIN, TIBC, IRON, RETICCTPCT in the last 72 hours. Sepsis Labs: No results for input(s): PROCALCITON, LATICACIDVEN in the last 168 hours.  Recent Results (from the past 240 hour(s))  CSF culture w Gram Stain     Status: None   Collection Time: 07/12/21  9:54 AM   Specimen: PATH Cytology CSF;  Cerebrospinal Fluid  Result Value Ref Range Status   Specimen Description CSF  Final   Special Requests NONE  Final   Gram Stain   Final    WBC PRESENT,BOTH PMN AND MONONUCLEAR NO ORGANISMS SEEN CYTOSPIN SMEAR    Culture   Final    NO GROWTH 3 DAYS Performed at [facility], [address] [place], [place] [address]    Report Status 07/15/2021 FINAL  Final  Culture, fungus without smear  Status: None (Preliminary result)   Collection Time: 07/12/21  9:54 AM   Specimen: PATH Cytology CSF; Cerebrospinal Fluid  Result Value Ref Range Status   Specimen Description CSF  Final   Special Requests NONE  Final   Culture   Final    NO FUNGUS ISOLATED AFTER 9 DAYS Performed at [facility], [place] [address] [place], [place] [address]    Report Status PENDING  Incomplete  Resp Panel by RT-PCR (Flu A&B, Covid) Nasopharyngeal Swab     Status: None   Collection Time: 07/20/21  5:09 PM   Specimen: Nasopharyngeal Swab; Nasopharyngeal(NP) swabs in vial transport medium  Result Value Ref Range Status   SARS Coronavirus 2 by RT PCR NEGATIVE NEGATIVE Final    Comment: (NOTE) SARS-CoV-2 target nucleic acids are NOT DETECTED.  The SARS-CoV-2 RNA is generally detectable in upper respiratory specimens during the acute phase of infection. The lowest concentration of SARS-CoV-2 viral copies this assay can detect is 138 copies/mL. A negative result does not preclude SARS-Cov-2 infection and should not be used as the sole basis for treatment or other patient management decisions. A negative result may occur with  improper specimen collection/handling, submission of specimen other than nasopharyngeal swab, presence of viral mutation(s) within the areas targeted by this assay, and inadequate number of viral copies(<138 copies/mL). A negative result must be combined with clinical observations, patient history, and epidemiological information. The expected result is Negative.  Fact Sheet  for Patients:  EntrepreneurPulse.com.au  Fact Sheet for Healthcare Providers:  IncredibleEmployment.be  This test is no t yet approved or cleared by the [place] FDA and  has been authorized for detection and/or diagnosis of SARS-CoV-2 by FDA under an Emergency Use Authorization (EUA). This EUA will remain  in effect (meaning this test can be used) for the duration of the COVID-19 declaration under Section 564(b)(1) of the Act, 21 U.S.C.section 360bbb-3(b)(1), unless the authorization is terminated  or revoked sooner.       Influenza A by PCR NEGATIVE NEGATIVE Final   Influenza B by PCR NEGATIVE NEGATIVE Final    Comment: (NOTE) The Xpert Xpress SARS-CoV-2/FLU/RSV plus assay is intended as an aid in the diagnosis of influenza from Nasopharyngeal swab specimens and should not be used as a sole basis for treatment. Nasal washings and aspirates are unacceptable for Xpert Xpress SARS-CoV-2/FLU/RSV testing.  Fact Sheet for Patients: EntrepreneurPulse.com.au  Fact Sheet for Healthcare Providers: IncredibleEmployment.be  This test is not yet approved or cleared by the [place] FDA and has been authorized for detection and/or diagnosis of SARS-CoV-2 by FDA under an Emergency Use Authorization (EUA). This EUA will remain in effect (meaning this test can be used) for the duration of the COVID-19 declaration under Section 564(b)(1) of the Act, 21 U.S.C. section 360bbb-3(b)(1), unless the authorization is terminated or revoked.  Performed at [facility], [place] [address] [place], [place] [address]   CSF culture w Gram Stain     Status: None (Preliminary result)   Collection Time: 07/21/21  9:08 AM   Specimen: PATH Cytology CSF; Cerebrospinal Fluid  Result Value Ref Range Status   Specimen Description CSF  Final   Special Requests NONE  Final   Gram Stain   Final    CYTOSPIN SMEAR WBC PRESENT,  PREDOMINANTLY MONONUCLEAR NO ORGANISMS SEEN Performed at [facility], [place] [address], [place], [place] [address]    Culture PENDING  Incomplete   Report Status PENDING  Incomplete  RN Pressure Injury Documentation:     Estimated body mass index is 29.32 kg/m as calculated from the following:   Height as of this encounter: '6\' 3"'$  (1.905 m).   Weight as of this encounter: 106.4 kg.  Malnutrition Type:   Malnutrition Characteristics:   Nutrition Interventions:    Radiology Studies: MR BRAIN WO CONTRAST  Result Date: 07/20/2021 CLINICAL DATA:  Neuro deficit, acute, stroke suspected EXAM: MRI HEAD WITHOUT CONTRAST TECHNIQUE: Multiplanar, multiecho pulse sequences of the brain and surrounding structures were obtained without intravenous contrast. COMPARISON:  Same day CT head.  MRI July 09, 2021. FINDINGS: Incomplete study. T2 and T1 weighted imaging was not performed. Within this limitation: Brain: No acute infarction, hemorrhage, hydrocephalus, extra-axial collection or mass lesion. Mild scattered T2 hyperintensities within the white matter, which are nonspecific but most likely related to chronic microvascular ischemic disease. Vascular: Not evaluated due to the absence of T2 weighted imaging Skull and upper cervical spine: Normal marrow signal. Sinuses/Orbits: Left maxillary sinus retention cyst. Mild ethmoid air cell mucosal thickening. No acute orbital findings. Other: No sizable mastoid effusions. IMPRESSION: 1. Incomplete study. Within this limitation, no evidence of acute intracranial abnormality. Diffusion-weighted imaging is of good quality and there is no evidence of acute infarct. 2. Mild chronic microvascular ischemic disease. Electronically Signed   By: Margaretha Sheffield MD   On: 07/20/2021 18:02   EEG adult  Result Date: 07/21/2021 Lora Havens, MD     07/21/2021 12:27 PM Patient Name: KALOBE BATMAN MRN: [id number] Epilepsy Attending: Lora Havens Referring  Physician/Provider: Dr. Amie Portland Date: 07/21/2021 Duration: 24.25 mins Patient history: 65 year old male who presented with aphasia and confusion.  EEG to evaluate for seizures. Level of alertness: Awake, asleep AEDs during EEG study: None Technical aspects: This EEG study was done with scalp electrodes positioned according to the 10-20 International system of electrode placement. Electrical activity was acquired at a sampling rate of '500Hz'$  and reviewed with a high frequency filter of '70Hz'$  and a low frequency filter of '1Hz'$ . EEG data were recorded continuously and digitally stored. Description: The posterior dominant rhythm consists of 8-9 Hz activity of moderate voltage (25-35 uV) seen predominantly in posterior head regions, symmetric and reactive to eye opening and eye closing. Sleep was characterized by vertex waves, sleep spindles (12 to 14 Hz), maximal frontocentral region. Physiologic photic driving was not seen during photic stimulation. Hyperventilation was not performed.   IMPRESSION: This study is within normal limits. No seizures or epileptiform discharges were seen throughout the recording. Lora Havens   CT HEAD CODE STROKE WO CONTRAST  Result Date: 07/20/2021 CLINICAL DATA:  Code stroke.  65 year old male EXAM: CT HEAD WITHOUT CONTRAST TECHNIQUE: Contiguous axial images were obtained from the base of the skull through the vertex without intravenous contrast. COMPARISON:  07/10/2021. FINDINGS: Brain: No evidence of acute infarction, hemorrhage, hydrocephalus, extra-axial collection or mass lesion/mass effect. Vascular: No hyperdense vessel or unexpected calcification. Skull: Normal. Negative for fracture or focal lesion. Sinuses/Orbits: Mucous retention cyst in the left maxillary sinus. Mild mucosal thickening in the anterior ethmoid air cells. Other: None. ASPECTS [facility] Stroke Program Early CT Score) - Ganglionic level infarction (caudate, lentiform nuclei, internal capsule, insula, M1-M3  cortex): 7 - Supraganglionic infarction (M4-M6 cortex): 3 Total score (0-10 with 10 being normal): 10 IMPRESSION: 1. No acute infarct or other acute intracranial process. 2. ASPECTS is 10 Code stroke imaging results were communicated on 07/20/2021 at 4:47 pm to provider Rory Percy via secure text  paging. Electronically Signed   By: Merilyn Baba MD   On: 07/20/2021 16:49   DG FL GUIDED LUMBAR PUNCTURE  Result Date: 07/21/2021 CLINICAL DATA:  Meningitis EXAM: DIAGNOSTIC LUMBAR PUNCTURE UNDER FLUOROSCOPIC GUIDANCE COMPARISON:  07/12/2021 FLUOROSCOPY TIME:  Fluoroscopy Time:  12 seconds Radiation Exposure Index (if provided by the fluoroscopic device): 1.5 mGy Number of Acquired Spot Images: 0 PROCEDURE: Informed consent was obtained from the patient prior to the procedure, including potential complications of headache, allergy, and pain. With the patient prone, the lower back was prepped with Betadine. 1% Lidocaine was used for local anesthesia. Lumbar puncture was performed at the L3-L4 level using a 20 gauge needle with return of clear CSF with an opening pressure of 22 cm water. 12 ml of CSF were obtained for laboratory studies. The patient tolerated the procedure well and there were no apparent complications. IMPRESSION: Successful fluoroscopic guided lumbar puncture at L3-L4 yielding 12 mL of clear CSF. Electronically Signed   By: Maurine Simmering   On: 07/21/2021 09:47    Scheduled Meds:  cyanocobalamin  1,000 mcg Intramuscular Once   insulin aspart  0-9 Units Subcutaneous Q4H   Continuous Infusions:  acyclovir 1,000 mg (07/21/21 1516)   lactated ringers 125 mL/hr at 07/20/21 2249    LOS: 0 days   Kerney Elbe, DO [facility] PAGER is on AMION  If 7PM-7AM, please contact night-coverage [email or web address]

## 2021-07-21 NOTE — Procedures (Signed)
Successful image guided lumbar puncture at L3-L4 yielding 12 mL of clear CSF. Fluid sent to laboratory for analysis.  Please see full dictation under imaging tab in Epic.  Maurine Simmering, M.D.

## 2021-07-21 NOTE — Progress Notes (Signed)
vLTM EEG started. Educated family member on event button. Pt monitored. Notified Neuro

## 2021-07-21 NOTE — Progress Notes (Signed)
EEG completed, results pending. 

## 2021-07-22 ENCOUNTER — Inpatient Hospital Stay: Payer: Medicare Other | Admitting: Adult Health

## 2021-07-22 DIAGNOSIS — I10 Essential (primary) hypertension: Secondary | ICD-10-CM | POA: Diagnosis not present

## 2021-07-22 DIAGNOSIS — D729 Disorder of white blood cells, unspecified: Secondary | ICD-10-CM

## 2021-07-22 DIAGNOSIS — G032 Benign recurrent meningitis [Mollaret]: Secondary | ICD-10-CM | POA: Diagnosis not present

## 2021-07-22 DIAGNOSIS — R7401 Elevation of levels of liver transaminase levels: Secondary | ICD-10-CM

## 2021-07-22 DIAGNOSIS — G9340 Encephalopathy, unspecified: Secondary | ICD-10-CM | POA: Diagnosis not present

## 2021-07-22 LAB — COMPREHENSIVE METABOLIC PANEL
ALT: 49 U/L — ABNORMAL HIGH (ref 0–44)
AST: 27 U/L (ref 15–41)
Albumin: 3.6 g/dL (ref 3.5–5.0)
Alkaline Phosphatase: 54 U/L (ref 38–126)
Anion gap: 9 (ref 5–15)
BUN: 20 mg/dL (ref 8–23)
CO2: 24 mmol/L (ref 22–32)
Calcium: 9.6 mg/dL (ref 8.9–10.3)
Chloride: 103 mmol/L (ref 98–111)
Creatinine, Ser: 1.02 mg/dL (ref 0.61–1.24)
GFR, Estimated: >60 mL/min (ref >60)
Glucose, Bld: 106 mg/dL — ABNORMAL HIGH (ref 70–99)
Potassium: 4.1 mmol/L (ref 3.5–5.1)
Sodium: 136 mmol/L (ref 135–145)
Total Bilirubin: 0.4 mg/dL (ref 0.3–1.2)
Total Protein: 7.7 g/dL (ref 6.5–8.1)

## 2021-07-22 LAB — GLUCOSE, CAPILLARY
Glucose-Capillary: 138 mg/dL — ABNORMAL HIGH (ref 70–99)
Glucose-Capillary: 165 mg/dL — ABNORMAL HIGH (ref 70–99)
Glucose-Capillary: 124 mg/dL — ABNORMAL HIGH (ref 70–99)
Glucose-Capillary: 188 mg/dL — ABNORMAL HIGH (ref 70–99)
Glucose-Capillary: 146 mg/dL — ABNORMAL HIGH (ref 70–99)

## 2021-07-22 LAB — CBC WITH DIFFERENTIAL/PLATELET
Abs Immature Granulocytes: 0.02 10*3/uL (ref 0.00–0.07)
Basophils Absolute: 0 10*3/uL (ref 0.0–0.1)
Basophils Relative: 1 %
Eosinophils Absolute: 0.1 10*3/uL (ref 0.0–0.5)
Eosinophils Relative: 2 %
HCT: 44.7 % (ref 39.0–52.0)
Hemoglobin: 15.2 g/dL (ref 13.0–17.0)
Immature Granulocytes: 0 %
Lymphocytes Relative: 42 %
Lymphs Abs: 2.6 10*3/uL (ref 0.7–4.0)
MCH: 32.9 pg (ref 26.0–34.0)
MCHC: 34 g/dL (ref 30.0–36.0)
MCV: 96.8 fL (ref 80.0–100.0)
Monocytes Absolute: 0.4 10*3/uL (ref 0.1–1.0)
Monocytes Relative: 6 %
Neutro Abs: 3.1 10*3/uL (ref 1.7–7.7)
Neutrophils Relative %: 49 %
Platelets: 182 10*3/uL (ref 150–400)
RBC: 4.62 MIL/uL (ref 4.22–5.81)
RDW: 14.1 % (ref 11.5–15.5)
WBC: 6.3 10*3/uL (ref 4.0–10.5)
nRBC: 0 % (ref 0.0–0.2)

## 2021-07-22 LAB — CYTOLOGY - NON PAP

## 2021-07-22 LAB — MAGNESIUM: Magnesium: 2.1 mg/dL (ref 1.7–2.4)

## 2021-07-22 LAB — PHOSPHORUS: Phosphorus: 2.9 mg/dL (ref 2.5–4.6)

## 2021-07-22 NOTE — Progress Notes (Signed)
Neurology Progress Note   S:// Seen and examined. No complaints at this time Overnight LTM read pending   O:// Current vital signs: BP (!) 146/86 (BP Location: Right Arm)   Pulse 66   Temp 98.4 F (36.9 C) (Oral)   Resp 17   Ht '6\' 3"'$  (1.905 m)   Wt 106.4 kg   SpO2 98%   BMI 29.32 kg/m  Vital signs in last 24 hours: Temp:  [98.2 F (36.8 C)-99.4 F (37.4 C)] 98.4 F (36.9 C) (08/11 0728) Pulse Rate:  [57-73] 66 (08/11 0728) Resp:  [11-20] 17 (08/11 0728) BP: (115-151)/(73-86) 146/86 (08/11 0728) SpO2:  [98 %-100 %] 98 % (08/11 0728) GENERAL: Awake, alert in NAD HEENT: - Normocephalic and atraumatic, dry mm, no LN++, no Thyromegally LUNGS - Clear to auscultation bilaterally with no wheezes CV - S1S2 RRR, no m/r/g, equal pulses bilaterally. ABDOMEN - Soft, nontender, nondistended with normoactive BS Ext: warm, well perfused, intact peripheral pulses, no edema NEURO:  Mental Status: AA&Ox3  Language: speech is clear.  Naming, repetition, fluency, and comprehension intact although he does have some hesitancy naming at times. Cranial Nerves: PERRL EOMI, visual fields full, no facial asymmetry, facial sensation intact, hearing intact, tongue/uvula/soft palate midline, normal sternocleidomastoid and trapezius muscle strength. No evidence of tongue atrophy or fibrillations Motor: 5/5 in all 4s Tone: is normal and bulk is normal Sensation- Intact to light touch bilaterally Coordination: FTN intact bilaterally, no ataxia in BLE. Gait- deferred  Medications  Current Facility-Administered Medications:    acetaminophen (TYLENOL) tablet 650 mg, 650 mg, Oral, Q6H PRN **OR** acetaminophen (TYLENOL) suppository 650 mg, 650 mg, Rectal, Q6H PRN, Alcario Drought, Jared M, DO   acyclovir (ZOVIRAX) 1,000 mg in dextrose 5 % 150 mL IVPB, 1,000 mg, Intravenous, Q8H, Heloise Purpura, RPH, Last Rate: 170 mL/hr at 07/22/21 0512, 1,000 mg at 07/22/21 [id number]   cyanocobalamin ((VITAMIN B-12)) injection  1,000 mcg, 1,000 mcg, Intramuscular, Once, Alcario Drought, Jared M, DO   insulin aspart (novoLOG) injection 0-9 Units, 0-9 Units, Subcutaneous, Q4H, Etta Quill, DO, 2 Units at 07/22/21 [id number]   lactated ringers infusion, , Intravenous, Continuous, Etta Quill, DO, Last Rate: 125 mL/hr at 07/21/21 2306, New Bag at 07/21/21 2306   ondansetron (ZOFRAN) tablet 4 mg, 4 mg, Oral, Q6H PRN **OR** ondansetron (ZOFRAN) injection 4 mg, 4 mg, Intravenous, Q6H PRN, Etta Quill, DO Labs CBC    Component Value Date/Time   WBC 6.3 07/22/2021 0151   RBC 4.62 07/22/2021 0151   HGB 15.2 07/22/2021 0151   HCT 44.7 07/22/2021 0151   PLT 182 07/22/2021 0151   MCV 96.8 07/22/2021 0151   MCH 32.9 07/22/2021 0151   MCHC 34.0 07/22/2021 0151   RDW 14.1 07/22/2021 0151   LYMPHSABS 2.6 07/22/2021 0151   MONOABS 0.4 07/22/2021 0151   EOSABS 0.1 07/22/2021 0151   BASOSABS 0.0 07/22/2021 0151    CMP     Component Value Date/Time   NA 136 07/22/2021 0151   K 4.1 07/22/2021 0151   CL 103 07/22/2021 0151   CO2 24 07/22/2021 0151   GLUCOSE 106 (H) 07/22/2021 0151   BUN 20 07/22/2021 0151   CREATININE 1.02 07/22/2021 0151   CALCIUM 9.6 07/22/2021 0151   PROT 7.7 07/22/2021 0151   ALBUMIN 3.6 07/22/2021 0151   AST 27 07/22/2021 0151   ALT 49 (H) 07/22/2021 0151   ALKPHOS 54 07/22/2021 0151   BILITOT 0.4 07/22/2021 0151   GFRNONAA >60 07/22/2021 0151  GFRAA >60 05/25/2011 0449   Routine EEG-normal  Imaging I have reviewed images in epic and the results pertinent to this consultation are: MRI brain limited-no stroke  Assessment: 65 year old man presenting for aphasia and confusion-stereotypical episodes-previously seen for this last week and diagnosed with aseptic meningitis.  Stat MRI negative for stroke. Continues to complain of intermittent headaches and spells of confusion. Underwent a spinal tap with CSF revealing 26 red cells, 105 WBCs of which 96% and lymphocytes 4% monocytes 0% segmented  neutrophils, total protein of 80, glucose of 53. This is very similar to the previous spinal tap with WBC count of 165 with 98% lymphocytes and a total protein of 81. Suspect aseptic meningitis.  Could be recurrent aseptic meningitis/Mollarets meningitis ID has been consulted-appreciate involvement. Due to stereotypic nature-seizure should also be considered in differentials  Recommendations: EEG read pending - wait for read, if no sz, can d/c Continue acyclovir for empiric HSV meningitis coverage till PCR is negative ID has sent meningeal encephalitis panel that includes 7 bacteria and 7 viruses-it is a send out test and will take some time to come back.  Cryptococcal antigen also ordered. No need for bacterial meningitic coverage based on the CSF findings. Symptomatic treatment of headache as needed Continue B12 replenishment No AEDs for now   -- Amie Portland, MD Neurologist [facility] Pager: [phone]

## 2021-07-22 NOTE — Procedures (Addendum)
Patient Name: Jason [name]  MRN: [id number]  Epilepsy Attending: Lora Havens  Referring Physician/Provider: Dr. Amie Portland Duration: 07/21/2021 1201 to 07/22/2021 1146   Patient history: 65 year old male who presented with aphasia and confusion.  EEG to evaluate for seizures.   Level of alertness: Awake, asleep   AEDs during EEG study: None   Technical aspects: This EEG study was done with scalp electrodes positioned according to the 10-20 International system of electrode placement. Electrical activity was acquired at a sampling rate of '500Hz'$  and reviewed with a high frequency filter of '70Hz'$  and a low frequency filter of '1Hz'$ . EEG data were recorded continuously and digitally stored.    Description: The posterior dominant rhythm consists of 8-9 Hz activity of moderate voltage (25-35 uV) seen predominantly in posterior head regions, symmetric and reactive to eye opening and eye closing. Sleep was characterized by vertex waves, sleep spindles (12 to 14 Hz), maximal frontocentral region. Physiologic photic driving was not seen during photic stimulation. Hyperventilation was not performed.      IMPRESSION: This study is within normal limits. No seizures or epileptiform discharges were seen throughout the recording.   Kyaira Trantham Barbra Sarks

## 2021-07-22 NOTE — Progress Notes (Signed)
[facility] for Infectious Disease  Date of Admission:  07/20/2021     CC: Encephalopathy Csf pleocytosis  Abx: 8/9-c acyclovir   8/9-10 doxy                                                         Assessment: 65 yo male with headache/confusion syndrome the last week x2 with initial csf lymphocytic leukocytosis    Unclear what is causing his csf leukocytosis. Doesn't quite act like an infectious process especially this time with quick resolution of sx and lacking sepsis criteria or subjective f/c otherwise   Previous csf 7/30 with mild pleocysosis but negative hsv pcr/cx and normal protein/glucose.  Repeat csf this admission with improved wbc count. Not sure what syndrome this is but appears to be improving?  Previous hiv screen negative and bcx negative 7/30   Serum Cryptococcal Ag negative Awaiting csf pcr sentout result    Plan: Continue acyclovir until csf pcr negative for hsv Low suspicion for bacterial meningitis that we have to treat Discussed with primary team  I spent more than 35 minute reviewing data/chart, and coordinating care and >50% direct face to face time providing counseling/discussing diagnostics/treatment plan with patient   Principal Problem:   Acute encephalopathy Active Problems:   Essential hypertension   Type 2 diabetes mellitus (HCC)   Mollaret's meningitis   Tick bite   Encephalopathy acute   Allergies  Allergen Reactions   Lisinopril     hyperkalemia   Reglan [Metoclopramide] Anxiety    Patient had bad reaction requiring ativan in addition to benadryl. He doesn't want to receive it any more    Scheduled Meds:  cyanocobalamin  1,000 mcg Intramuscular Once   insulin aspart  0-9 Units Subcutaneous Q4H   Continuous Infusions:  acyclovir 1,000 mg (07/22/21 0512)   lactated ringers 125 mL/hr at 07/22/21 1105   PRN Meds:.acetaminophen **OR** acetaminophen, ondansetron **OR** ondansetron (ZOFRAN)  IV   SUBJECTIVE: Patient felt well today. Currently no headache. Wife said his confusion resolved No rash, joint pain, myalgia No n/v/diarrhea  Afebrile Eeg show no seizure activity  Review of Systems: ROS All other ROS was negative, except mentioned above     OBJECTIVE: Vitals:   07/21/21 2353 07/22/21 0400 07/22/21 0728 07/22/21 1140  BP: 137/82 (!) 151/80 (!) 146/86 125/68  Pulse: 67 (!) 57 66 (!) 54  Resp: '20 11 17 18  '$ Temp: 98.4 F (36.9 C) 98.4 F (36.9 C) 98.4 F (36.9 C) 98.1 F (36.7 C)  TempSrc: Oral Oral Oral Oral  SpO2: 100% 99% 98% 100%  Weight:      Height:       Body mass index is 29.32 kg/m.  Physical Exam General/constitutional: no distress, pleasant HEENT: Normocephalic, PER, Conj Clear, EOMI, Oropharynx clear Neck supple CV: rrr no mrg Lungs: clear to auscultation, normal respiratory effort Abd: Soft, Nontender Ext: no edema Skin: No Rash Neuro: nonfocal MSK: no peripheral joint swelling/tenderness/warmth; back spines nontender    Lab Results Lab Results  Component Value Date   WBC 6.3 07/22/2021   HGB 15.2 07/22/2021   HCT 44.7 07/22/2021   MCV 96.8 07/22/2021   PLT 182 07/22/2021    Lab Results  Component Value Date   CREATININE 1.02 07/22/2021   BUN  20 07/22/2021   NA 136 07/22/2021   K 4.1 07/22/2021   CL 103 07/22/2021   CO2 24 07/22/2021    Lab Results  Component Value Date   ALT 49 (H) 07/22/2021   AST 27 07/22/2021   ALKPHOS 54 07/22/2021   BILITOT 0.4 07/22/2021      Microbiology: Recent Results (from the past 240 hour(s))  Resp Panel by RT-PCR (Flu A&B, Covid) Nasopharyngeal Swab     Status: None   Collection Time: 07/20/21  5:09 PM   Specimen: Nasopharyngeal Swab; Nasopharyngeal(NP) swabs in vial transport medium  Result Value Ref Range Status   SARS Coronavirus 2 by RT PCR NEGATIVE NEGATIVE Final    Comment: (NOTE) SARS-CoV-2 target nucleic acids are NOT DETECTED.  The SARS-CoV-2 RNA is generally  detectable in upper respiratory specimens during the acute phase of infection. The lowest concentration of SARS-CoV-2 viral copies this assay can detect is 138 copies/mL. A negative result does not preclude SARS-Cov-2 infection and should not be used as the sole basis for treatment or other patient management decisions. A negative result may occur with  improper specimen collection/handling, submission of specimen other than nasopharyngeal swab, presence of viral mutation(s) within the areas targeted by this assay, and inadequate number of viral copies(<138 copies/mL). A negative result must be combined with clinical observations, patient history, and epidemiological information. The expected result is Negative.  Fact Sheet for Patients:  EntrepreneurPulse.com.au  Fact Sheet for Healthcare Providers:  IncredibleEmployment.be  This test is no t yet approved or cleared by the [place] FDA and  has been authorized for detection and/or diagnosis of SARS-CoV-2 by FDA under an Emergency Use Authorization (EUA). This EUA will remain  in effect (meaning this test can be used) for the duration of the COVID-19 declaration under Section 564(b)(1) of the Act, 21 U.S.C.section 360bbb-3(b)(1), unless the authorization is terminated  or revoked sooner.       Influenza A by PCR NEGATIVE NEGATIVE Final   Influenza B by PCR NEGATIVE NEGATIVE Final    Comment: (NOTE) The Xpert Xpress SARS-CoV-2/FLU/RSV plus assay is intended as an aid in the diagnosis of influenza from Nasopharyngeal swab specimens and should not be used as a sole basis for treatment. Nasal washings and aspirates are unacceptable for Xpert Xpress SARS-CoV-2/FLU/RSV testing.  Fact Sheet for Patients: EntrepreneurPulse.com.au  Fact Sheet for Healthcare Providers: IncredibleEmployment.be  This test is not yet approved or cleared by the [place] FDA  and has been authorized for detection and/or diagnosis of SARS-CoV-2 by FDA under an Emergency Use Authorization (EUA). This EUA will remain in effect (meaning this test can be used) for the duration of the COVID-19 declaration under Section 564(b)(1) of the Act, 21 U.S.C. section 360bbb-3(b)(1), unless the authorization is terminated or revoked.  Performed at [facility], [place] [address], [place], [place] [address]   CSF culture w Gram Stain     Status: None (Preliminary result)   Collection Time: 07/21/21  9:08 AM   Specimen: PATH Cytology CSF; Cerebrospinal Fluid  Result Value Ref Range Status   Specimen Description CSF  Final   Special Requests NONE  Final   Gram Stain   Final    CYTOSPIN SMEAR WBC PRESENT, PREDOMINANTLY MONONUCLEAR NO ORGANISMS SEEN    Culture   Final    NO GROWTH < 24 HOURS Performed at [facility], [place] [address], [place], [place] [address]    Report Status PENDING  Incomplete  Serology:   Imaging: If present, new imagings (plain films, ct scans, and mri) have been personally visualized and interpreted; radiology reports have been reviewed. Decision making incorporated into the Impression / Recommendations.   8/9 mri brain without contrast Incomplete study. T2 and T1 weighted imaging was not performed. Within this limitation, no evidence of acute intracranial abnormality. Diffusion-weighted imaging is of good quality and there is no evidence of acute infarct. 2. Mild chronic microvascular ischemic disease.  Jabier Mutton, [place] [facility] for Infectious [place] [phone] pager    07/22/2021, 1:33 PM

## 2021-07-22 NOTE — Progress Notes (Signed)
D/C. [facility] notified. No skin break down.

## 2021-07-22 NOTE — Progress Notes (Signed)
PROGRESS NOTE    Jason [name]  [id number] DOB: [birth year] DOA: 07/20/2021 PCP: [name], NP   Brief Narrative:  The patient is a 64 year old overweight African-American male with past medical history significant for but not limited to hypertension, diabetes mellitus type 2 who presented a couple weeks ago with a tick bite prior to his onset of symptoms.  He initially presented to the ED on 07/09/2021 -07/10/2021 with a 1 week history of headache progressing to confusion.  He had no reported fevers or chills and he was admitted to hospitalist service on 07/10/2021 until 07/15/2021 and work-up at that time was suggestive of aseptic/viral meningitis.  Initial CSF findings at that time was under 65 WBCs with 90% lymphs and he did have 81 protein in 60s of glucose at that time.  Cultures were negative and it is 1B and 2 CF cephalonegative.  Lyme CSF was negative and arm SF peripheral IgM were negative.  Initially was placed on broad-spectrum antibiotics with acyclovir and then just tapered acyclovir and then ultimately taken off for his HSV came back negative.  Following his discharge he improved significantly but then had relapses with symptoms and had significant headache yesterday with no meningismus.  He had altered mental status and had word finding difficulty around 3 PM yesterday.  He returned to the ED for recurrent symptoms and denies any meningismus.  States his headache is now mild but he did have AMS and lethargy and did wake up to answer questions.  In the ED he is afebrile with no evidence of SIRS criteria.  Neurology was consulted and initially concern for seizure so he was loaded with Keppra and he is given a migraine cocktail.  He underwent MRI testing which was unable to get T1-2 images but DWI were negative for acute stroke.  He was admitted for his acute encephalopathy and neurology recommended repeating a lumbar puncture and this was done by interventional radiology.  He underwent  EEG monitoring which showed the study was within normal limits and no seizures or epileptiform discharges were seen throughout the recording.  Given his symptoms infectious diseases was also consulted and they recommended stopping doxycycline for now and continue acyclovir and checking a serum cryptococcal antigen as well as sending orders for CSF meningeal encephalitis PCR.  Because of his recurrence of symptoms neurology feels that he has aseptic meningitis that could be recurrent or could be moderate meningitis.  ID has been consulted and made the recommendations and because of his stereotactic nature, seizures are also included in differential.  Neurology recommending LTM.  LTM has now been discontinued given no seizures and they are recommending continuing acyclovir for empiric HSV meningitis coverage until meningeal encephalitis PCR results.  Cryptococcal antigen is negative  Assessment & Plan:   Principal Problem:   Acute encephalopathy Active Problems:   Essential hypertension   Type 2 diabetes mellitus (HCC)   Mollaret's meningitis   Tick bite   Encephalopathy acute   CSF pleocytosis  Acute encephalopathy with Headache and periods of aphasia and confusion with Suspicion for Aseptic Meningitis, improving -Presentation is suspicious for recurrent aseptic meningitis versus Mollarets meningitis -HSV is negative on aspiration -Neurology has loaded the patient with antiepileptics and obtaining an EEG and LTM but these were negative so now LTM has been discontinued -He is continuing to get B12 replacement -Obtain another LP and cytology and repeat spinal tap showed a CSF of 26 red cells, 105 WBCs which are 96% lymphocytes, 4% monocytes and 0%  segmented neutrophils, total protein of 80 and a glucose of 53 which was similar to last week's presentation -Infectious diseases has been consulted and recommending continuing empiric acyclovir for HSV Menigitis Coverage until PCR results Negative  (Meningeal Encephalitis Panel has been sent) and stopping the empiric doxycycline for his history of tick bite -Labs have been sent out and he is going to get a cell count and differential as well as the labs ordered by infectious diseases -Neurology recommends no need for bacterial meningitic coverage based on his CSF findings and continuing symptomatic treatment of his headache as needed -We will discontinue IV fluids now -Given Levetiracetam 3000 mg x1 in the ED  -Was on LTM and now discontinued given that the study was within normal limits and no seizures or epileptiform discharges were seen throughout the recording  Diabetes mellitus type 2 -Continue to hold home medications  -Continue with sensitive sliding scale insulin every 4 and changed before meals and at bedtime in the morning -Continue monitor blood sugars and ranging from 96-165  Elevated ALT -Mild. ALT was 49 -Continue to Monitor and Trend -Repeat CMP in the AM  Hypertension -Continue to monitor blood pressures per protocol -Last blood pressure was 125/68 -If necessary will place on as needed hydralazine  Overweight -Estimated body mass index is 29.32 kg/m as calculated from the following:   Height as of this encounter: '6\' 3"'$  (1.905 m).   Weight as of this encounter: 106.4 kg. -Weight loss and dietary counseling given  DVT prophylaxis: SCDs Code Status: FULL CODE Family Communication: Discussed with wife at bedside  Disposition Plan: Pending further clinical improvement by specialists  Status is: Inpatient  Remains inpatient appropriate because:Unsafe d/c plan, IV treatments appropriate due to intensity of illness or inability to take PO, and Inpatient level of care appropriate due to severity of illness  Dispo: The patient is from: Home              Anticipated d/c is to: Home              Patient currently is not medically stable to d/c.   Difficult to place patient No  Consultants:   ID Neurology  Procedures: Lumbar Puncture  Antimicrobials:  Anti-infectives (From admission, onward)    Start     Dose/Rate Route Frequency Ordered Stop   07/20/21 2200  acyclovir (ZOVIRAX) 1,000 mg in dextrose 5 % 150 mL IVPB        1,000 mg 170 mL/hr over 60 Minutes Intravenous Every 8 hours 07/20/21 2029     07/20/21 2115  doxycycline (VIBRAMYCIN) 100 mg in sodium chloride 0.9 % 250 mL IVPB  Status:  Discontinued        100 mg 125 mL/hr over 120 Minutes Intravenous Every 12 hours 07/20/21 2107 07/21/21 1039        Subjective: Seen and examined at bedside he is just about to get discontinued from his LTM monitoring.  He states his headache is better.  No chest pain or shortness breath.  Feels better today.  No other concerns or plans at this time is much more awake and talking to me today.   Objective: Vitals:   07/21/21 2353 07/22/21 0400 07/22/21 0728 07/22/21 1140  BP: 137/82 (!) 151/80 (!) 146/86 125/68  Pulse: 67 (!) 57 66 (!) 54  Resp: '20 11 17 18  '$ Temp: 98.4 F (36.9 C) 98.4 F (36.9 C) 98.4 F (36.9 C) 98.1 F (36.7 C)  TempSrc: Oral Oral Oral Oral  SpO2: 100% 99% 98% 100%  Weight:      Height:        Intake/Output Summary (Last 24 hours) at 07/22/2021 1428 Last data filed at 07/22/2021 0900 Gross per 24 hour  Intake 4549 ml  Output 725 ml  Net 3824 ml    Filed Weights   07/20/21 1632  Weight: 106.4 kg   Examination: Physical Exam:  Constitutional: WN/WD overweight AAM in NAD and appears calm and comfortable Eyes: Lids and conjunctivae normal, sclerae anicteric  ENMT: External Ears, Nose appear normal. Grossly normal hearing.  Neck: Appears normal, supple, no cervical masses, normal ROM, no appreciable thyromegaly; no JVD Respiratory: Diminished to auscultation bilaterally, no wheezing, rales, rhonchi or crackles. Normal respiratory effort and patient is not tachypenic. No accessory muscle use. Unlabored breathing   Cardiovascular: RRR, no murmurs  / rubs / gallops. S1 and S2 auscultated. Trace extremity edema Abdomen: Soft, non-tender, distended 2/2 body habitus. Bowel sounds positive.  GU: Deferred. Musculoskeletal: No clubbing / cyanosis of digits/nails. No joint deformity upper and lower extremities.  Skin: No rashes, lesions, ulcers on a limited skin evaluation. No induration; Warm and dry.  Neurologic: CN 2-12 grossly intact with no focal deficits. Romberg sign and cerebellar reflexes not assessed.  Psychiatric: Normal judgment and insight. Alert and oriented x 3. Normal mood and appropriate affect.   Data Reviewed: I have personally reviewed following labs and imaging studies  CBC: Recent Labs  Lab 07/20/21 1631 07/20/21 1637 07/21/21 0050 07/22/21 0151  WBC 8.9  --  8.6 6.3  NEUTROABS 5.1  --   --  3.1  HGB 15.6 15.6 15.2 15.2  HCT 47.1 46.0 45.3 44.7  MCV 98.5  --  98.1 96.8  PLT 186  --  185 [id number]    Basic Metabolic Panel: Recent Labs  Lab 07/20/21 1631 07/20/21 1637 07/21/21 0050 07/22/21 0151  NA 138 141 138 136  K 3.7 3.9 4.7 4.1  CL 102 109 105 103  CO2 21*  --  22 24  GLUCOSE 137* 135* 103* 106*  BUN 20 24* 20 20  CREATININE 1.36* 1.20 1.21 1.02  CALCIUM 10.3  --  9.9 9.6  MG  --   --   --  2.1  PHOS  --   --   --  2.9    GFR: Estimated Creatinine Clearance: 92.7 mL/min (by C-G formula based on SCr of 1.02 mg/dL). Liver Function Tests: Recent Labs  Lab 07/20/21 1631 07/22/21 0151  AST 37 27  ALT 62* 49*  ALKPHOS 63 54  BILITOT 0.7 0.4  PROT 7.9 7.7  ALBUMIN 4.0 3.6    No results for input(s): LIPASE, AMYLASE in the last 168 hours. No results for input(s): AMMONIA in the last 168 hours. Coagulation Profile: Recent Labs  Lab 07/20/21 1631  INR 1.1    Cardiac Enzymes: No results for input(s): CKTOTAL, CKMB, CKMBINDEX, TROPONINI in the last 168 hours. BNP (last 3 results) No results for input(s): PROBNP in the last 8760 hours. HbA1C: No results for input(s): HGBA1C in the last 72  hours. CBG: Recent Labs  Lab 07/21/21 2103 07/21/21 2351 07/22/21 0357 07/22/21 0820 07/22/21 1137  GLUCAP 139* 96 138* 165* 124*    Lipid Profile: No results for input(s): CHOL, HDL, LDLCALC, TRIG, CHOLHDL, LDLDIRECT in the last 72 hours. Thyroid Function Tests: No results for input(s): TSH, T4TOTAL, FREET4, T3FREE, THYROIDAB in the last 72 hours. Anemia Panel: No results for input(s): VITAMINB12, FOLATE, FERRITIN, TIBC, IRON,  RETICCTPCT in the last 72 hours. Sepsis Labs: No results for input(s): PROCALCITON, LATICACIDVEN in the last 168 hours.  Recent Results (from the past 240 hour(s))  Resp Panel by RT-PCR (Flu A&B, Covid) Nasopharyngeal Swab     Status: None   Collection Time: 07/20/21  5:09 PM   Specimen: Nasopharyngeal Swab; Nasopharyngeal(NP) swabs in vial transport medium  Result Value Ref Range Status   SARS Coronavirus 2 by RT PCR NEGATIVE NEGATIVE Final    Comment: (NOTE) SARS-CoV-2 target nucleic acids are NOT DETECTED.  The SARS-CoV-2 RNA is generally detectable in upper respiratory specimens during the acute phase of infection. The lowest concentration of SARS-CoV-2 viral copies this assay can detect is 138 copies/mL. A negative result does not preclude SARS-Cov-2 infection and should not be used as the sole basis for treatment or other patient management decisions. A negative result may occur with  improper specimen collection/handling, submission of specimen other than nasopharyngeal swab, presence of viral mutation(s) within the areas targeted by this assay, and inadequate number of viral copies(<138 copies/mL). A negative result must be combined with clinical observations, patient history, and epidemiological information. The expected result is Negative.  Fact Sheet for Patients:  EntrepreneurPulse.com.au  Fact Sheet for Healthcare Providers:  IncredibleEmployment.be  This test is no t yet approved or cleared by  the [place] FDA and  has been authorized for detection and/or diagnosis of SARS-CoV-2 by FDA under an Emergency Use Authorization (EUA). This EUA will remain  in effect (meaning this test can be used) for the duration of the COVID-19 declaration under Section 564(b)(1) of the Act, 21 U.S.C.section 360bbb-3(b)(1), unless the authorization is terminated  or revoked sooner.       Influenza A by PCR NEGATIVE NEGATIVE Final   Influenza B by PCR NEGATIVE NEGATIVE Final    Comment: (NOTE) The Xpert Xpress SARS-CoV-2/FLU/RSV plus assay is intended as an aid in the diagnosis of influenza from Nasopharyngeal swab specimens and should not be used as a sole basis for treatment. Nasal washings and aspirates are unacceptable for Xpert Xpress SARS-CoV-2/FLU/RSV testing.  Fact Sheet for Patients: EntrepreneurPulse.com.au  Fact Sheet for Healthcare Providers: IncredibleEmployment.be  This test is not yet approved or cleared by the [place] FDA and has been authorized for detection and/or diagnosis of SARS-CoV-2 by FDA under an Emergency Use Authorization (EUA). This EUA will remain in effect (meaning this test can be used) for the duration of the COVID-19 declaration under Section 564(b)(1) of the Act, 21 U.S.C. section 360bbb-3(b)(1), unless the authorization is terminated or revoked.  Performed at [facility], [place] [address], [place], [place] [address]   CSF culture w Gram Stain     Status: None (Preliminary result)   Collection Time: 07/21/21  9:08 AM   Specimen: PATH Cytology CSF; Cerebrospinal Fluid  Result Value Ref Range Status   Specimen Description CSF  Final   Special Requests NONE  Final   Gram Stain   Final    CYTOSPIN SMEAR WBC PRESENT, PREDOMINANTLY MONONUCLEAR NO ORGANISMS SEEN    Culture   Final    NO GROWTH < 24 HOURS Performed at [facility], [place] [address], [place], [place] [address]    Report Status  PENDING  Incomplete     RN Pressure Injury Documentation:     Estimated body mass index is 29.32 kg/m as calculated from the following:   Height as of this encounter: '6\' 3"'$  (1.905 m).   Weight as of this  encounter: 106.4 kg.  Malnutrition Type:   Malnutrition Characteristics:   Nutrition Interventions:    Radiology Studies: MR BRAIN WO CONTRAST  Result Date: 07/20/2021 CLINICAL DATA:  Neuro deficit, acute, stroke suspected EXAM: MRI HEAD WITHOUT CONTRAST TECHNIQUE: Multiplanar, multiecho pulse sequences of the brain and surrounding structures were obtained without intravenous contrast. COMPARISON:  Same day CT head.  MRI July 09, 2021. FINDINGS: Incomplete study. T2 and T1 weighted imaging was not performed. Within this limitation: Brain: No acute infarction, hemorrhage, hydrocephalus, extra-axial collection or mass lesion. Mild scattered T2 hyperintensities within the white matter, which are nonspecific but most likely related to chronic microvascular ischemic disease. Vascular: Not evaluated due to the absence of T2 weighted imaging Skull and upper cervical spine: Normal marrow signal. Sinuses/Orbits: Left maxillary sinus retention cyst. Mild ethmoid air cell mucosal thickening. No acute orbital findings. Other: No sizable mastoid effusions. IMPRESSION: 1. Incomplete study. Within this limitation, no evidence of acute intracranial abnormality. Diffusion-weighted imaging is of good quality and there is no evidence of acute infarct. 2. Mild chronic microvascular ischemic disease. Electronically Signed   By: Margaretha Sheffield MD   On: 07/20/2021 18:02   EEG adult  Result Date: 07/21/2021 Lora Havens, MD     07/21/2021 12:27 PM Patient Name: KOHYN PANCIERA MRN: [id number] Epilepsy Attending: Lora Havens Referring Physician/Provider: Dr. Amie Portland Date: 07/21/2021 Duration: 24.25 mins Patient history: 65 year old male who presented with aphasia and confusion.  EEG to evaluate for  seizures. Level of alertness: Awake, asleep AEDs during EEG study: None Technical aspects: This EEG study was done with scalp electrodes positioned according to the 10-20 International system of electrode placement. Electrical activity was acquired at a sampling rate of '500Hz'$  and reviewed with a high frequency filter of '70Hz'$  and a low frequency filter of '1Hz'$ . EEG data were recorded continuously and digitally stored. Description: The posterior dominant rhythm consists of 8-9 Hz activity of moderate voltage (25-35 uV) seen predominantly in posterior head regions, symmetric and reactive to eye opening and eye closing. Sleep was characterized by vertex waves, sleep spindles (12 to 14 Hz), maximal frontocentral region. Physiologic photic driving was not seen during photic stimulation. Hyperventilation was not performed.   IMPRESSION: This study is within normal limits. No seizures or epileptiform discharges were seen throughout the recording. Priyanka Barbra Sarks   Overnight EEG with video  Result Date: 07/22/2021 Lora Havens, MD     07/22/2021 12:06 PM Patient Name: DEMARIS LAVERTY MRN: [id number] Epilepsy Attending: Lora Havens Referring Physician/Provider: Dr. Amie Portland Duration: 07/21/2021 1201 to 07/22/2021 1146  Patient history: 65 year old male who presented with aphasia and confusion.  EEG to evaluate for seizures.  Level of alertness: Awake, asleep  AEDs during EEG study: None  Technical aspects: This EEG study was done with scalp electrodes positioned according to the 10-20 International system of electrode placement. Electrical activity was acquired at a sampling rate of '500Hz'$  and reviewed with a high frequency filter of '70Hz'$  and a low frequency filter of '1Hz'$ . EEG data were recorded continuously and digitally stored.  Description: The posterior dominant rhythm consists of 8-9 Hz activity of moderate voltage (25-35 uV) seen predominantly in posterior head regions, symmetric and reactive to eye  opening and eye closing. Sleep was characterized by vertex waves, sleep spindles (12 to 14 Hz), maximal frontocentral region. Physiologic photic driving was not seen during photic stimulation. Hyperventilation was not performed.    IMPRESSION: This study is within normal limits. No  seizures or epileptiform discharges were seen throughout the recording.  Lora Havens   CT HEAD CODE STROKE WO CONTRAST  Result Date: 07/20/2021 CLINICAL DATA:  Code stroke.  65 year old male EXAM: CT HEAD WITHOUT CONTRAST TECHNIQUE: Contiguous axial images were obtained from the base of the skull through the vertex without intravenous contrast. COMPARISON:  07/10/2021. FINDINGS: Brain: No evidence of acute infarction, hemorrhage, hydrocephalus, extra-axial collection or mass lesion/mass effect. Vascular: No hyperdense vessel or unexpected calcification. Skull: Normal. Negative for fracture or focal lesion. Sinuses/Orbits: Mucous retention cyst in the left maxillary sinus. Mild mucosal thickening in the anterior ethmoid air cells. Other: None. ASPECTS [facility] Stroke Program Early CT Score) - Ganglionic level infarction (caudate, lentiform nuclei, internal capsule, insula, M1-M3 cortex): 7 - Supraganglionic infarction (M4-M6 cortex): 3 Total score (0-10 with 10 being normal): 10 IMPRESSION: 1. No acute infarct or other acute intracranial process. 2. ASPECTS is 10 Code stroke imaging results were communicated on 07/20/2021 at 4:47 pm to provider Rory Percy via secure text paging. Electronically Signed   By: Merilyn Baba MD   On: 07/20/2021 16:49   DG FL GUIDED LUMBAR PUNCTURE  Result Date: 07/21/2021 CLINICAL DATA:  Meningitis EXAM: DIAGNOSTIC LUMBAR PUNCTURE UNDER FLUOROSCOPIC GUIDANCE COMPARISON:  07/12/2021 FLUOROSCOPY TIME:  Fluoroscopy Time:  12 seconds Radiation Exposure Index (if provided by the fluoroscopic device): 1.5 mGy Number of Acquired Spot Images: 0 PROCEDURE: Informed consent was obtained from the patient prior to the  procedure, including potential complications of headache, allergy, and pain. With the patient prone, the lower back was prepped with Betadine. 1% Lidocaine was used for local anesthesia. Lumbar puncture was performed at the L3-L4 level using a 20 gauge needle with return of clear CSF with an opening pressure of 22 cm water. 12 ml of CSF were obtained for laboratory studies. The patient tolerated the procedure well and there were no apparent complications. IMPRESSION: Successful fluoroscopic guided lumbar puncture at L3-L4 yielding 12 mL of clear CSF. Electronically Signed   By: Maurine Simmering   On: 07/21/2021 09:47    Scheduled Meds:  cyanocobalamin  1,000 mcg Intramuscular Once   insulin aspart  0-9 Units Subcutaneous Q4H   Continuous Infusions:  acyclovir 1,000 mg (07/22/21 0512)   lactated ringers 125 mL/hr at 07/22/21 1105    LOS: 1 day   Kerney Elbe, DO [facility] PAGER is on AMION  If 7PM-7AM, please contact night-coverage [email or web address]

## 2021-07-23 ENCOUNTER — Inpatient Hospital Stay (HOSPITAL_COMMUNITY): Payer: Medicare Other

## 2021-07-23 DIAGNOSIS — G032 Benign recurrent meningitis [Mollaret]: Secondary | ICD-10-CM | POA: Diagnosis not present

## 2021-07-23 DIAGNOSIS — I10 Essential (primary) hypertension: Secondary | ICD-10-CM | POA: Diagnosis not present

## 2021-07-23 DIAGNOSIS — G9340 Encephalopathy, unspecified: Secondary | ICD-10-CM | POA: Diagnosis not present

## 2021-07-23 DIAGNOSIS — D729 Disorder of white blood cells, unspecified: Secondary | ICD-10-CM | POA: Diagnosis not present

## 2021-07-23 LAB — GLUCOSE, CAPILLARY
Glucose-Capillary: 123 mg/dL — ABNORMAL HIGH (ref 70–99)
Glucose-Capillary: 132 mg/dL — ABNORMAL HIGH (ref 70–99)
Glucose-Capillary: 136 mg/dL — ABNORMAL HIGH (ref 70–99)
Glucose-Capillary: 139 mg/dL — ABNORMAL HIGH (ref 70–99)
Glucose-Capillary: 192 mg/dL — ABNORMAL HIGH (ref 70–99)
Glucose-Capillary: 210 mg/dL — ABNORMAL HIGH (ref 70–99)

## 2021-07-23 LAB — CBC WITH DIFFERENTIAL/PLATELET
Abs Immature Granulocytes: 0.02 10*3/uL (ref 0.00–0.07)
Basophils Absolute: 0.1 10*3/uL (ref 0.0–0.1)
Basophils Relative: 1 %
Eosinophils Absolute: 0.2 10*3/uL (ref 0.0–0.5)
Eosinophils Relative: 2 %
HCT: 46.1 % (ref 39.0–52.0)
Hemoglobin: 15.7 g/dL (ref 13.0–17.0)
Immature Granulocytes: 0 %
Lymphocytes Relative: 41 %
Lymphs Abs: 2.8 10*3/uL (ref 0.7–4.0)
MCH: 32.6 pg (ref 26.0–34.0)
MCHC: 34.1 g/dL (ref 30.0–36.0)
MCV: 95.8 fL (ref 80.0–100.0)
Monocytes Absolute: 0.5 10*3/uL (ref 0.1–1.0)
Monocytes Relative: 7 %
Neutro Abs: 3.3 10*3/uL (ref 1.7–7.7)
Neutrophils Relative %: 49 %
Platelets: 202 10*3/uL (ref 150–400)
RBC: 4.81 MIL/uL (ref 4.22–5.81)
RDW: 13.8 % (ref 11.5–15.5)
WBC: 6.8 10*3/uL (ref 4.0–10.5)
nRBC: 0 % (ref 0.0–0.2)

## 2021-07-23 LAB — COMPREHENSIVE METABOLIC PANEL
ALT: 55 U/L — ABNORMAL HIGH (ref 0–44)
AST: 33 U/L (ref 15–41)
Albumin: 3.8 g/dL (ref 3.5–5.0)
Alkaline Phosphatase: 59 U/L (ref 38–126)
Anion gap: 7 (ref 5–15)
BUN: 18 mg/dL (ref 8–23)
CO2: 25 mmol/L (ref 22–32)
Calcium: 9.9 mg/dL (ref 8.9–10.3)
Chloride: 102 mmol/L (ref 98–111)
Creatinine, Ser: 0.97 mg/dL (ref 0.61–1.24)
GFR, Estimated: >60 mL/min (ref >60)
Glucose, Bld: 132 mg/dL — ABNORMAL HIGH (ref 70–99)
Potassium: 4 mmol/L (ref 3.5–5.1)
Sodium: 134 mmol/L — ABNORMAL LOW (ref 135–145)
Total Bilirubin: 0.6 mg/dL (ref 0.3–1.2)
Total Protein: 8 g/dL (ref 6.5–8.1)

## 2021-07-23 LAB — MAGNESIUM: Magnesium: 2 mg/dL (ref 1.7–2.4)

## 2021-07-23 LAB — OLIGOCLONAL BANDS, CSF + SERM

## 2021-07-23 LAB — PHOSPHORUS: Phosphorus: 3.1 mg/dL (ref 2.5–4.6)

## 2021-07-23 MED ORDER — LORAZEPAM 2 MG/ML IJ SOLN
1.0000 mg | Freq: Once | INTRAMUSCULAR | Status: AC | PRN
  Administered 2021-07-23: 1 mg via INTRAVENOUS
  Filled 2021-07-23: qty 1

## 2021-07-23 MED ORDER — DIPHENHYDRAMINE HCL 50 MG/ML IJ SOLN
50.0000 mg | Freq: Once | INTRAMUSCULAR | Status: AC
  Administered 2021-07-23: 50 mg via INTRAVENOUS
  Filled 2021-07-23: qty 1

## 2021-07-23 MED ORDER — LORAZEPAM 2 MG/ML IJ SOLN
2.0000 mg | Freq: Once | INTRAMUSCULAR | Status: AC
  Administered 2021-07-23: 2 mg via INTRAVENOUS
  Filled 2021-07-23: qty 1

## 2021-07-23 MED ORDER — QUETIAPINE FUMARATE 25 MG PO TABS
25.0000 mg | ORAL_TABLET | Freq: Every day | ORAL | Status: DC
  Administered 2021-07-23: 25 mg via ORAL
  Filled 2021-07-23: qty 1

## 2021-07-23 MED ORDER — HALOPERIDOL LACTATE 5 MG/ML IJ SOLN
2.0000 mg | Freq: Four times a day (QID) | INTRAMUSCULAR | Status: DC | PRN
  Administered 2021-07-23: 2 mg via INTRAMUSCULAR
  Filled 2021-07-23: qty 1

## 2021-07-23 MED ORDER — LACTATED RINGERS IV SOLN: INTRAVENOUS | Status: DC

## 2021-07-23 NOTE — Progress Notes (Signed)
Pt brought to MRI for exam. Upon arrival pt seemed confused but cooperative. Daughter was contacted to answer safety screening questions. Pt was deemed clear and was placed onto scan table and placed in MRI scanner. Upon exiting the scan room, pt immediately began attempting to remove head coil used for imaging and attempted to remove himself from the scanner. Explained importance of exam to pt. RN was contacted to administer meds in hopes to improve pt condition. RN stated pt had received Ativan earlier and it was determined that further Ativan would not effectively improve pt condition to achieve diagnostic images. Pt was removed from scan room and was transferred back onto pt bed to be sent back to their room. Before transport was able to arrive, pt stood up from their bed. Several verbal attempts were made to get the pt back onto their bed to no avail. Pt was swaying back and forth and appeared dizzy, confused and disoriented. Pt was physically guided in order to maintain standing position and prevent fall with further attempts to sit the pt down. Pt quickly refused to sit down and began asking and physically seeking an exit stating they wanted to leave. Due to pt's condition both MRI tech, tech extender, and transporter stayed with pt at sides as there was no way convince the pt to remain put. During this time both security and pt's nurse had been contacted by another tech extender but did not arrive in time before pt had exited MRI department. Pt was guided towards security desk where various further attempts were made by various accompanying staff to assist the pt. Pt soon became combative but soon calmed down enough to be convinced to make a phone call to a family member who soon arrived along with pt's nurse. Pt was convinced to sit in a wheelchair and be transported back to room.

## 2021-07-23 NOTE — Progress Notes (Addendum)
[facility]  Date of Admission:  07/20/2021     CC: Encephalopathy Csf pleocytosis  Abx: 8/9-c acyclovir   8/9-10 doxy                                                         Assessment: 65 yo male with headache/confusion syndrome the last week x2 with initial csf lymphocytic leukocytosis    Unclear what is causing his csf leukocytosis. Doesn't quite act like an infectious process especially this time with quick resolution of sx and lacking sepsis criteria or subjective f/c otherwise   Previous csf 7/30 with mild pleocysosis but negative hsv pcr/cx and normal protein/glucose.  Repeat csf this admission with improved wbc count. Not sure what syndrome this is but appears to be improving?  Previous hiv screen negative and bcx negative 7/30   Serum Cryptococcal Ag negative   -------- 8/12 assessment Per nursing staff still encephalopathic (visual hallucination)  Afebrile Mentating well though without hallucination when I saw today Awaiting csf pcr sentout result     Plan: Continue acyclovir F/u sent out csf pcr I spoke with lab again today 8/12; if there is csf left over, will send for westnile/arborvirus panel serology as well (ordered as miscellaneous labs) Discussed with primary team/neurology  ----------- I spoke with lab. Only enough csf left over to do [place] virus serology, not for the whole arbovirus panel  If the csf pcr is negative for hsv, can stop acyclovir ID clinic f/u arranged, and we can f/u westnile serology at that time   Clinic Follow Up Appt: Dr Gale Journey on 8/19 @ 1030 am  @  [facility] [place], [place], [place] [address] Phone: [phone]    I spent more than 35 minute reviewing data/chart, and coordinating care and >50% direct face to face time providing counseling/discussing diagnostics/treatment plan with patient   Principal Problem:   Acute encephalopathy Active Problems:    Essential hypertension   Type 2 diabetes mellitus (HCC)   Mollaret's meningitis   Tick bite   Encephalopathy acute   CSF pleocytosis   Allergies  Allergen Reactions   Lisinopril     hyperkalemia   Reglan [Metoclopramide] Anxiety    Patient had bad reaction requiring ativan in addition to benadryl. He doesn't want to receive it any more    Scheduled Meds:  cyanocobalamin  1,000 mcg Intramuscular Once   insulin aspart  0-9 Units Subcutaneous Q4H   Continuous Infusions:  acyclovir 1,000 mg (07/23/21 0623)   PRN Meds:.acetaminophen **OR** acetaminophen, ondansetron **OR** ondansetron (ZOFRAN) IV   SUBJECTIVE: Patient feeling well. No headache, confusion No n/v/diarrhea No rash  Spoke with nursing who reports this morning patient was having visual hallucination  Review of Systems: ROS All other ROS was negative, except mentioned above     OBJECTIVE: Vitals:   07/23/21 0005 07/23/21 0409 07/23/21 0807 07/23/21 1129  BP: (!) 143/84 (!) 149/87 (!) 155/91 140/84  Pulse: 71 74 83 82  Resp: '18 18 16 18  '$ Temp: 98.2 F (36.8 C) 98.3 F (36.8 C) (!) 97.5 F (36.4 C) 98 F (36.7 C)  TempSrc: Oral Oral Oral Oral  SpO2: 100% 100% 100% 100%  Weight:      Height:  Body mass index is 29.32 kg/m.  Physical Exam General/constitutional: no distress, pleasant HEENT: Normocephalic, PER, Conj Clear, EOMI, Oropharynx clear Neck supple CV: rrr no mrg Lungs: clear to auscultation, normal respiratory effort Abd: Soft, Nontender Ext: no edema Skin: No Rash Neuro: nonfocal MSK: no peripheral joint swelling/tenderness/warmth; back spines nontender      Lab Results Lab Results  Component Value Date   WBC 6.8 07/23/2021   HGB 15.7 07/23/2021   HCT 46.1 07/23/2021   MCV 95.8 07/23/2021   PLT 202 07/23/2021    Lab Results  Component Value Date   CREATININE 0.97 07/23/2021   BUN 18 07/23/2021   NA 134 (L) 07/23/2021   K 4.0 07/23/2021   CL 102 07/23/2021    CO2 25 07/23/2021    Lab Results  Component Value Date   ALT 55 (H) 07/23/2021   AST 33 07/23/2021   ALKPHOS 59 07/23/2021   BILITOT 0.6 07/23/2021      Microbiology: Recent Results (from the past 240 hour(s))  Resp Panel by RT-PCR (Flu A&B, Covid) Nasopharyngeal Swab     Status: None   Collection Time: 07/20/21  5:09 PM   Specimen: Nasopharyngeal Swab; Nasopharyngeal(NP) swabs in vial transport medium  Result Value Ref Range Status   SARS Coronavirus 2 by RT PCR NEGATIVE NEGATIVE Final    Comment: (NOTE) SARS-CoV-2 target nucleic acids are NOT DETECTED.  The SARS-CoV-2 RNA is generally detectable in upper respiratory specimens during the acute phase of infection. The lowest concentration of SARS-CoV-2 viral copies this assay can detect is 138 copies/mL. A negative result does not preclude SARS-Cov-2 infection and should not be used as the sole basis for treatment or other patient management decisions. A negative result may occur with  improper specimen collection/handling, submission of specimen other than nasopharyngeal swab, presence of viral mutation(s) within the areas targeted by this assay, and inadequate number of viral copies(<138 copies/mL). A negative result must be combined with clinical observations, patient history, and epidemiological information. The expected result is Negative.  Fact Sheet for Patients:  EntrepreneurPulse.com.au  Fact Sheet for Healthcare Providers:  IncredibleEmployment.be  This test is no t yet approved or cleared by the [place] FDA and  has been authorized for detection and/or diagnosis of SARS-CoV-2 by FDA under an Emergency Use Authorization (EUA). This EUA will remain  in effect (meaning this test can be used) for the duration of the COVID-19 declaration under Section 564(b)(1) of the Act, 21 U.S.C.section 360bbb-3(b)(1), unless the authorization is terminated  or revoked sooner.        Influenza A by PCR NEGATIVE NEGATIVE Final   Influenza B by PCR NEGATIVE NEGATIVE Final    Comment: (NOTE) The Xpert Xpress SARS-CoV-2/FLU/RSV plus assay is intended as an aid in the diagnosis of influenza from Nasopharyngeal swab specimens and should not be used as a sole basis for treatment. Nasal washings and aspirates are unacceptable for Xpert Xpress SARS-CoV-2/FLU/RSV testing.  Fact Sheet for Patients: EntrepreneurPulse.com.au  Fact Sheet for Healthcare Providers: IncredibleEmployment.be  This test is not yet approved or cleared by the [place] FDA and has been authorized for detection and/or diagnosis of SARS-CoV-2 by FDA under an Emergency Use Authorization (EUA). This EUA will remain in effect (meaning this test can be used) for the duration of the COVID-19 declaration under Section 564(b)(1) of the Act, 21 U.S.C. section 360bbb-3(b)(1), unless the authorization is terminated or revoked.  Performed at [facility], [place] [address], [place], [place] [address]  CSF culture w Gram Stain     Status: None (Preliminary result)   Collection Time: 07/21/21  9:08 AM   Specimen: PATH Cytology CSF; Cerebrospinal Fluid  Result Value Ref Range Status   Specimen Description CSF  Final   Special Requests NONE  Final   Gram Stain   Final    CYTOSPIN SMEAR WBC PRESENT, PREDOMINANTLY MONONUCLEAR NO ORGANISMS SEEN    Culture   Final    NO GROWTH 2 DAYS Performed at [facility], [place] [address], [place], [place] [address]    Report Status PENDING  Incomplete  Culture, fungus without smear     Status: None (Preliminary result)   Collection Time: 07/21/21  9:08 AM   Specimen: PATH Cytology CSF; Cerebrospinal Fluid  Result Value Ref Range Status   Specimen Description CSF  Final   Special Requests NONE  Final   Culture   Final    NO FUNGUS ISOLATED AFTER 2 DAYS Performed at [facility], [place] [address] [place], [place]  [address]    Report Status PENDING  Incomplete     Serology:   Imaging: If present, new imagings (plain films, ct scans, and mri) have been personally visualized and interpreted; radiology reports have been reviewed. Decision making incorporated into the Impression / Recommendations.   8/9 mri brain without contrast Incomplete study. T2 and T1 weighted imaging was not performed. Within this limitation, no evidence of acute intracranial abnormality. Diffusion-weighted imaging is of good quality and there is no evidence of acute infarct. 2. Mild chronic microvascular ischemic disease.  Jabier Mutton, [facility] [place] [phone] pager    07/23/2021, 11:32 AM

## 2021-07-23 NOTE — Plan of Care (Signed)

## 2021-07-23 NOTE — Social Work (Signed)
Kennett faxed IVC paperwork to [facility]. Confirmed receipt.

## 2021-07-23 NOTE — Progress Notes (Addendum)
Neurology Progress Note   S:// Seen and examined. Wife and daughter at bedside. According to them, still not completely at his baseline but much better than what he was when he came in.   O:// Current vital signs: BP (!) 155/91 (BP Location: Right Arm)   Pulse 83   Temp (!) 97.5 F (36.4 C) (Oral)   Resp 16   Ht '6\' 3"'$  (1.905 m)   Wt 106.4 kg   SpO2 100%   BMI 29.32 kg/m  Vital signs in last 24 hours: Temp:  [97.5 F (36.4 C)-98.6 F (37 C)] 97.5 F (36.4 C) (08/12 0807) Pulse Rate:  [54-87] 83 (08/12 0807) Resp:  [16-20] 16 (08/12 0807) BP: (125-155)/(68-91) 155/91 (08/12 0807) SpO2:  [98 %-100 %] 100 % (08/12 0807) GENERAL: Awake, alert in NAD HEENT: - Normocephalic and atraumatic, dry mm, no LN++, no Thyromegally LUNGS - Clear to auscultation bilaterally with no wheezes CV - S1S2 RRR, no m/r/g, equal pulses bilaterally. ABDOMEN - Soft, nontender, nondistended with normoactive BS Ext: warm, well perfused, intact peripheral pulses, no edema NEURO:  Mental Status: AA&Ox3  Language: speech is clear.  Naming, repetition, fluency, and comprehension intact although he does have some hesitancy naming at times. Cranial Nerves: PERRL EOMI, visual fields full, no facial asymmetry, facial sensation intact, hearing intact, tongue/uvula/soft palate midline, normal sternocleidomastoid and trapezius muscle strength. No evidence of tongue atrophy or fibrillations Motor: 5/5 in all 4s Tone: is normal and bulk is normal Sensation- Intact to light touch bilaterally Coordination: FTN intact bilaterally, no ataxia in BLE. Gait-normal Unchanged exam  Medications  Current Facility-Administered Medications:    acetaminophen (TYLENOL) tablet 650 mg, 650 mg, Oral, Q6H PRN **OR** acetaminophen (TYLENOL) suppository 650 mg, 650 mg, Rectal, Q6H PRN, Alcario Drought, Jared M, DO   acyclovir (ZOVIRAX) 1,000 mg in dextrose 5 % 150 mL IVPB, 1,000 mg, Intravenous, Q8H, Heloise Purpura, RPH, Last Rate:  170 mL/hr at 07/23/21 [id number], 1,000 mg at 07/23/21 [id number]   cyanocobalamin ((VITAMIN B-12)) injection 1,000 mcg, 1,000 mcg, Intramuscular, Once, Alcario Drought, Jared M, DO   insulin aspart (novoLOG) injection 0-9 Units, 0-9 Units, Subcutaneous, Q4H, Etta Quill, DO, 1 Units at 07/23/21 0842   ondansetron (ZOFRAN) tablet 4 mg, 4 mg, Oral, Q6H PRN **OR** ondansetron (ZOFRAN) injection 4 mg, 4 mg, Intravenous, Q6H PRN, Etta Quill, DO Labs CBC    Component Value Date/Time   WBC 6.8 07/23/2021 0439   RBC 4.81 07/23/2021 0439   HGB 15.7 07/23/2021 0439   HCT 46.1 07/23/2021 0439   PLT 202 07/23/2021 0439   MCV 95.8 07/23/2021 0439   MCH 32.6 07/23/2021 0439   MCHC 34.1 07/23/2021 0439   RDW 13.8 07/23/2021 0439   LYMPHSABS 2.8 07/23/2021 0439   MONOABS 0.5 07/23/2021 0439   EOSABS 0.2 07/23/2021 0439   BASOSABS 0.1 07/23/2021 0439    CMP     Component Value Date/Time   NA 134 (L) 07/23/2021 0439   K 4.0 07/23/2021 0439   CL 102 07/23/2021 0439   CO2 25 07/23/2021 0439   GLUCOSE 132 (H) 07/23/2021 0439   BUN 18 07/23/2021 0439   CREATININE 0.97 07/23/2021 0439   CALCIUM 9.9 07/23/2021 0439   PROT 8.0 07/23/2021 0439   ALBUMIN 3.8 07/23/2021 0439   AST 33 07/23/2021 0439   ALT 55 (H) 07/23/2021 0439   ALKPHOS 59 07/23/2021 0439   BILITOT 0.6 07/23/2021 0439   GFRNONAA >60 07/23/2021 0439   GFRAA >60 05/25/2011 0449  Routine EEG-normal HSV PCR negative on 07/12/2021.  Most recent CSF HSV PCR pending  Imaging I have reviewed images in epic and the results pertinent to this consultation are: MRI brain limited-no stroke  Assessment: 65 year old man presenting for aphasia and confusion-stereotypical episodes-previously seen for this last week and diagnosed with aseptic meningitis.  Stat MRI negative for stroke. Continues to complain of intermittent headaches and spells of confusion. Underwent a spinal tap with CSF revealing 26 red cells, 105 WBCs of which 96% and lymphocytes 4%  monocytes 0% segmented neutrophils, total protein of 80, glucose of 53. This is very similar to the previous spinal tap with WBC count of 165 with 98% lymphocytes and a total protein of 81. Suspect aseptic meningitis.  Could be recurrent aseptic meningitis/Mollarets meningitis ID has been consulted-appreciate involvement. Due to stereotypic nature-seizure should also be considered in differentials for which we did prolonged EEG with no evidence of seizure.  Recommendations: -Continue acyclovir for empiric HSV meningitis coverage till PCR is negative -ID has sent meningoencephalitis panel that includes 7 bacteria and 7 viruses-it is a send out test and will take some time to come back.  Cryptococcal antigen also ordered. -No need for bacterial meningitic coverage based on the CSF findings. -Symptomatic treatment of headache as needed -Continue B12 replenishment -No AEDs for now -Given the fact that his prior MRI scans have been without contrast or limited sequence to rule out stroke, I will recommend getting an MRI of the brain with and without contrast which I have ordered.  Will follow. Discussed in detail with family. Discussed with Dr. Shelton Silvas  -- Amie Portland, MD Neurologist [facility] Pager: [phone]   Addendum Updated by the primary team-patient in the MRI scanner, started to remove head call and walked out of the department in spite staff trying to hurt him to get images.  No images were obtained.  He started swinging back-and-forth appeared confused and dizzy and was refusing to sit down, walking towards an exit.  Security had to be called.  Also became combative.  Was able to call a family member who was able to calm him down. Primary hospitalist aware Imaging can wait for now till he is better able to tolerate. We will continue to follow.  -- Amie Portland, MD Neurologist [facility] Pager: [phone]

## 2021-07-23 NOTE — Progress Notes (Signed)
PROGRESS NOTE    Jason [name]  [id number] DOB: 05/06/1956 DOA: 07/20/2021 PCP: [name], NP   Brief Narrative:  The patient is a 65 year old overweight African-American male with past medical history significant for but not limited to hypertension, diabetes mellitus type 2 who presented a couple weeks ago with a tick bite prior to his onset of symptoms.  He initially presented to the ED on 07/09/2021 -07/10/2021 with a 1 week history of headache progressing to confusion.  He had no reported fevers or chills and he was admitted to hospitalist service on 07/10/2021 until 07/15/2021 and work-up at that time was suggestive of aseptic/viral meningitis.  Initial CSF findings at that time was under 65 WBCs with 90% lymphs and he did have 81 protein in 60s of glucose at that time.  Cultures were negative and it is 1B and 2 CF cephalonegative.  Lyme CSF was negative and arm SF peripheral IgM were negative.  Initially was placed on broad-spectrum antibiotics with acyclovir and then just tapered acyclovir and then ultimately taken off for his HSV came back negative.  Following his discharge he improved significantly but then had relapses with symptoms and had significant headache yesterday with no meningismus.  He had altered mental status and had word finding difficulty around 3 PM yesterday.  He returned to the ED for recurrent symptoms and denies any meningismus.  States his headache is now mild but he did have AMS and lethargy and did wake up to answer questions.  In the ED he is afebrile with no evidence of SIRS criteria.  Neurology was consulted and initially concern for seizure so he was loaded with Keppra and he is given a migraine cocktail.  He underwent MRI testing which was unable to get T1-2 images but DWI were negative for acute stroke.  He was admitted for his acute encephalopathy and neurology recommended repeating a lumbar puncture and this was done by interventional radiology.  He underwent  EEG monitoring which showed the study was within normal limits and no seizures or epileptiform discharges were seen throughout the recording.  Given his symptoms infectious diseases was also consulted and they recommended stopping doxycycline for now and continue acyclovir and checking a serum cryptococcal antigen as well as sending orders for CSF meningeal encephalitis PCR.  Because of his recurrence of symptoms neurology feels that he has aseptic meningitis that could be recurrent or could be moderate meningitis.  ID has been consulted and made the recommendations and because of his stereotactic nature, seizures are also included in differential.  Neurology recommending LTM.  LTM has now been discontinued given no seizures and they are recommending continuing acyclovir for empiric HSV meningitis coverage until meningeal encephalitis PCR results.  Cryptococcal antigen is negative  Now ID is ordering [place] Nile virus serology.  If the CSF PCR is negative for HSV then ID is recommending stopping acyclovir and then they can follow-up on the [place] serology in clinic on 07/30/2021 at 10:30 AM.  Patient was hallucinating today and because of this neurology is repeating his MRI of the brain with and without contrast  Assessment & Plan:   Principal Problem:   Acute encephalopathy Active Problems:   Essential hypertension   Type 2 diabetes mellitus (HCC)   Mollaret's meningitis   Tick bite   Encephalopathy acute   CSF pleocytosis  Acute encephalopathy with Headache and periods of aphasia and confusion with Suspicion for Aseptic Meningitis, improving but now he is hallucinating -Presentation is suspicious for  recurrent aseptic meningitis versus Mollarets meningitis -Neurology has loaded the patient with antiepileptics and obtaining an EEG and LTM but these were negative so now LTM has been discontinued -He is continuing to get B12 replacement -Obtain another LP and cytology and repeat spinal tap showed  a CSF of 26 red cells, 105 WBCs which are 96% lymphocytes, 4% monocytes and 0% segmented neutrophils, total protein of 80 and a glucose of 53 which was similar to last week's presentation -Infectious diseases has been consulted and recommending continuing empiric acyclovir for HSV Menigitis Coverage until PCR results Negative (Meningeal Encephalitis Panel has been sent) and stopping the empiric doxycycline for his history of tick bite -Labs have been sent out and he is going to get a cell count and differential as well as the labs ordered by infectious diseases -Neurology recommends no need for bacterial meningitic coverage based on his CSF findings and continuing symptomatic treatment of his headache as needed -We discontinued IV fluids yesterday however will resume with LR at 75 mils per hour given that he is on acyclovir and will try and prevent AIN -Given Levetiracetam 3000 mg x1 in the ED  -Was on LTM and now discontinued given that the study was within normal limits and no seizures or epileptiform discharges were seen throughout the recording -ID is now sending out the [place] Nile serology -Patient was hallucinating today because he was hallucinating and the neurology team is recommending a MRI of the brain with and without contrast  Diabetes mellitus type 2 -Continue to hold home medications  -Continue with sensitive sliding scale insulin every 4 and changed before meals and at bedtime in the morning -Continue monitor blood sugars and ranging from 96-165  Elevated ALT -Mild. ALT was 49 -> 55 -Continue to Monitor and Trend -Repeat CMP in the AM  Hypertension -Continue to monitor blood pressures per protocol -Last blood pressure was 125/68 -If necessary will place on as needed hydralazine  Overweight -Estimated body mass index is 29.32 kg/m as calculated from the following:   Height as of this encounter: '6\' 3"'$  (1.905 m).   Weight as of this encounter: 106.4 kg. -Weight loss and  dietary counseling given  DVT prophylaxis: SCDs Code Status: FULL CODE Family Communication: No family present at bedside  Disposition Plan: Pending further clinical improvement by specialists  Status is: Inpatient  Remains inpatient appropriate because:Unsafe d/c plan, IV treatments appropriate due to intensity of illness or inability to take PO, and Inpatient level of care appropriate due to severity of illness  Dispo: The patient is from: Home              Anticipated d/c is to: Home              Patient currently is not medically stable to d/c.   Difficult to place patient No  Consultants:  ID Neurology  Procedures: Lumbar Puncture  Antimicrobials:  Anti-infectives (From admission, onward)    Start     Dose/Rate Route Frequency Ordered Stop   07/20/21 2200  acyclovir (ZOVIRAX) 1,000 mg in dextrose 5 % 150 mL IVPB        1,000 mg 170 mL/hr over 60 Minutes Intravenous Every 8 hours 07/20/21 2029     07/20/21 2115  doxycycline (VIBRAMYCIN) 100 mg in sodium chloride 0.9 % 250 mL IVPB  Status:  Discontinued        100 mg 125 mL/hr over 120 Minutes Intravenous Every 12 hours 07/20/21 2107 07/21/21 1039  Subjective: Seen and examined at bedside he he has been hallucinating earlier and being confused.  Marcelo Baldy is talking on the phone alert.  Neurology has ordered an MRI with and without contrast.  He denies any complaints or pain and no headache today.  No other concerns or plans at this time.  Objective: Vitals:   07/23/21 0005 07/23/21 0409 07/23/21 0807 07/23/21 1129  BP: (!) 143/84 (!) 149/87 (!) 155/91 140/84  Pulse: 71 74 83 82  Resp: '18 18 16 18  '$ Temp: 98.2 F (36.8 C) 98.3 F (36.8 C) (!) 97.5 F (36.4 C) 98 F (36.7 C)  TempSrc: Oral Oral Oral Oral  SpO2: 100% 100% 100% 100%  Weight:      Height:        Intake/Output Summary (Last 24 hours) at 07/23/2021 1449 Last data filed at 07/23/2021 0700 Gross per 24 hour  Intake 1030 ml  Output --  Net 1030  ml    Filed Weights   07/20/21 1632  Weight: 106.4 kg   Examination: Physical Exam:  Constitutional: WN/WD overweight AAM in NAD and appears calm and comfortable Eyes: Lids and conjunctivae normal, sclerae anicteric  ENMT: External Ears, Nose appear normal. Grossly normal hearing.  Neck: Appears normal, supple, no cervical masses, normal ROM, no appreciable thyromegaly; no JVD Respiratory: Diminished to auscultation bilaterally, no wheezing, rales, rhonchi or crackles. Normal respiratory effort and patient is not tachypenic. No accessory muscle use. Unlabored breathing  Cardiovascular: RRR, no murmurs / rubs / gallops. S1 and S2 auscultated. Trace Extremity Edema Abdomen: Soft, non-tender, Distended 2/2 to body habitus. No masses palpated. No appreciable hepatosplenomegaly. Bowel sounds positive.  GU: Deferred. Musculoskeletal: No clubbing / cyanosis of digits/nails. No joint deformity upper and lower extremities.  Skin: No rashes, lesions, ulcers. No induration; Warm and dry.  Neurologic: CN 2-12 grossly intact with no focal deficits. Romberg sign cerebellar reflexes not assessed.  Psychiatric: Normal judgment and insight. Alert and oriented x 3. Normal mood and appropriate affect.   Data Reviewed: I have personally reviewed following labs and imaging studies  CBC: Recent Labs  Lab 07/20/21 1631 07/20/21 1637 07/21/21 0050 07/22/21 0151 07/23/21 0439  WBC 8.9  --  8.6 6.3 6.8  NEUTROABS 5.1  --   --  3.1 3.3  HGB 15.6 15.6 15.2 15.2 15.7  HCT 47.1 46.0 45.3 44.7 46.1  MCV 98.5  --  98.1 96.8 95.8  PLT 186  --  185 182 [id number]    Basic Metabolic Panel: Recent Labs  Lab 07/20/21 1631 07/20/21 1637 07/21/21 0050 07/22/21 0151 07/23/21 0439  NA 138 141 138 136 134*  K 3.7 3.9 4.7 4.1 4.0  CL 102 109 105 103 102  CO2 21*  --  '22 24 25  '$ GLUCOSE 137* 135* 103* 106* 132*  BUN 20 24* '20 20 18  '$ CREATININE 1.36* 1.20 1.21 1.02 0.97  CALCIUM 10.3  --  9.9 9.6 9.9  MG  --    --   --  2.1 2.0  PHOS  --   --   --  2.9 3.1    GFR: Estimated Creatinine Clearance: 97.5 mL/min (by C-G formula based on SCr of 0.97 mg/dL). Liver Function Tests: Recent Labs  Lab 07/20/21 1631 07/22/21 0151 07/23/21 0439  AST 37 27 33  ALT 62* 49* 55*  ALKPHOS 63 54 59  BILITOT 0.7 0.4 0.6  PROT 7.9 7.7 8.0  ALBUMIN 4.0 3.6 3.8    No results for input(s): LIPASE,  AMYLASE in the last 168 hours. No results for input(s): AMMONIA in the last 168 hours. Coagulation Profile: Recent Labs  Lab 07/20/21 1631  INR 1.1    Cardiac Enzymes: No results for input(s): CKTOTAL, CKMB, CKMBINDEX, TROPONINI in the last 168 hours. BNP (last 3 results) No results for input(s): PROBNP in the last 8760 hours. HbA1C: No results for input(s): HGBA1C in the last 72 hours. CBG: Recent Labs  Lab 07/22/21 2041 07/23/21 0007 07/23/21 0410 07/23/21 0811 07/23/21 1137  GLUCAP 146* 123* 132* 139* 136*    Lipid Profile: No results for input(s): CHOL, HDL, LDLCALC, TRIG, CHOLHDL, LDLDIRECT in the last 72 hours. Thyroid Function Tests: No results for input(s): TSH, T4TOTAL, FREET4, T3FREE, THYROIDAB in the last 72 hours. Anemia Panel: No results for input(s): VITAMINB12, FOLATE, FERRITIN, TIBC, IRON, RETICCTPCT in the last 72 hours. Sepsis Labs: No results for input(s): PROCALCITON, LATICACIDVEN in the last 168 hours.  Recent Results (from the past 240 hour(s))  Resp Panel by RT-PCR (Flu A&B, Covid) Nasopharyngeal Swab     Status: None   Collection Time: 07/20/21  5:09 PM   Specimen: Nasopharyngeal Swab; Nasopharyngeal(NP) swabs in vial transport medium  Result Value Ref Range Status   SARS Coronavirus 2 by RT PCR NEGATIVE NEGATIVE Final    Comment: (NOTE) SARS-CoV-2 target nucleic acids are NOT DETECTED.  The SARS-CoV-2 RNA is generally detectable in upper respiratory specimens during the acute phase of infection. The lowest concentration of SARS-CoV-2 viral copies this assay can  detect is 138 copies/mL. A negative result does not preclude SARS-Cov-2 infection and should not be used as the sole basis for treatment or other patient management decisions. A negative result may occur with  improper specimen collection/handling, submission of specimen other than nasopharyngeal swab, presence of viral mutation(s) within the areas targeted by this assay, and inadequate number of viral copies(<138 copies/mL). A negative result must be combined with clinical observations, patient history, and epidemiological information. The expected result is Negative.  Fact Sheet for Patients:  EntrepreneurPulse.com.au  Fact Sheet for Healthcare Providers:  IncredibleEmployment.be  This test is no t yet approved or cleared by the [place] FDA and  has been authorized for detection and/or diagnosis of SARS-CoV-2 by FDA under an Emergency Use Authorization (EUA). This EUA will remain  in effect (meaning this test can be used) for the duration of the COVID-19 declaration under Section 564(b)(1) of the Act, 21 U.S.C.section 360bbb-3(b)(1), unless the authorization is terminated  or revoked sooner.       Influenza A by PCR NEGATIVE NEGATIVE Final   Influenza B by PCR NEGATIVE NEGATIVE Final    Comment: (NOTE) The Xpert Xpress SARS-CoV-2/FLU/RSV plus assay is intended as an aid in the diagnosis of influenza from Nasopharyngeal swab specimens and should not be used as a sole basis for treatment. Nasal washings and aspirates are unacceptable for Xpert Xpress SARS-CoV-2/FLU/RSV testing.  Fact Sheet for Patients: EntrepreneurPulse.com.au  Fact Sheet for Healthcare Providers: IncredibleEmployment.be  This test is not yet approved or cleared by the [place] FDA and has been authorized for detection and/or diagnosis of SARS-CoV-2 by FDA under an Emergency Use Authorization (EUA). This EUA will remain in  effect (meaning this test can be used) for the duration of the COVID-19 declaration under Section 564(b)(1) of the Act, 21 U.S.C. section 360bbb-3(b)(1), unless the authorization is terminated or revoked.  Performed at [facility], [place] [address], [place], [place] [address]   CSF culture w Gram Stain  Status: None (Preliminary result)   Collection Time: 07/21/21  9:08 AM   Specimen: PATH Cytology CSF; Cerebrospinal Fluid  Result Value Ref Range Status   Specimen Description CSF  Final   Special Requests NONE  Final   Gram Stain   Final    CYTOSPIN SMEAR WBC PRESENT, PREDOMINANTLY MONONUCLEAR NO ORGANISMS SEEN    Culture   Final    NO GROWTH 2 DAYS Performed at [facility], [address], [place], [place] [address]    Report Status PENDING  Incomplete  Culture, fungus without smear     Status: None (Preliminary result)   Collection Time: 07/21/21  9:08 AM   Specimen: PATH Cytology CSF; Cerebrospinal Fluid  Result Value Ref Range Status   Specimen Description CSF  Final   Special Requests NONE  Final   Culture   Final    NO FUNGUS ISOLATED AFTER 2 DAYS Performed at [facility], [place] [address], [place], [place] [address]    Report Status PENDING  Incomplete     RN Pressure Injury Documentation:     Estimated body mass index is 29.32 kg/m as calculated from the following:   Height as of this encounter: '6\' 3"'$  (1.905 m).   Weight as of this encounter: 106.4 kg.  Malnutrition Type:   Malnutrition Characteristics:   Nutrition Interventions:    Radiology Studies: Overnight EEG with video  Result Date: 07/22/2021 Lora Havens, MD     07/22/2021 12:06 PM Patient Name: TANIS MALCZEWSKI MRN: [id number] Epilepsy Attending: Lora Havens Referring Physician/Provider: Dr. Amie Portland Duration: 07/21/2021 1201 to 07/22/2021 1146  Patient history: 65 year old male who presented with aphasia and confusion.  EEG to evaluate for seizures.  Level of  alertness: Awake, asleep  AEDs during EEG study: None  Technical aspects: This EEG study was done with scalp electrodes positioned according to the 10-20 International system of electrode placement. Electrical activity was acquired at a sampling rate of '500Hz'$  and reviewed with a high frequency filter of '70Hz'$  and a low frequency filter of '1Hz'$ . EEG data were recorded continuously and digitally stored.  Description: The posterior dominant rhythm consists of 8-9 Hz activity of moderate voltage (25-35 uV) seen predominantly in posterior head regions, symmetric and reactive to eye opening and eye closing. Sleep was characterized by vertex waves, sleep spindles (12 to 14 Hz), maximal frontocentral region. Physiologic photic driving was not seen during photic stimulation. Hyperventilation was not performed.    IMPRESSION: This study is within normal limits. No seizures or epileptiform discharges were seen throughout the recording.  Priyanka Barbra Sarks    Scheduled Meds:  cyanocobalamin  1,000 mcg Intramuscular Once   insulin aspart  0-9 Units Subcutaneous Q4H   Continuous Infusions:  acyclovir 1,000 mg (07/23/21 [id number])   lactated ringers      LOS: 2 days   Kerney Elbe, DO [facility] PAGER is on [place]  If 7PM-7AM, please contact night-coverage [email or web address]

## 2021-07-23 NOTE — Care Management Important Message (Signed)
Important Message  Patient Details  Name: Jason [name] MRN: [id number] Date of Birth: 26-Feb-1956   Medicare Important Message Given:  Yes     [name] 07/23/2021, 4:52 PM

## 2021-07-23 NOTE — Progress Notes (Signed)
RN called to room due to patient removing restraints, displaying agitated behavior and attempting to get out of bed. Restraints reapplied, IV benadryl and ativan given per order. Pt monitors and IV fluid also reconnected at this time. Pt lucidity waxing and waning rapidly but he was able to appropriately answer some questions with difficulty finding the correct words. Pt denied pain, discomfort, hunger, thirst or bathroom needs at this time. Pt calmer after medication and resting comfortably. Family at bedside.

## 2021-07-24 ENCOUNTER — Inpatient Hospital Stay (HOSPITAL_COMMUNITY): Payer: Medicare Other

## 2021-07-24 DIAGNOSIS — D729 Disorder of white blood cells, unspecified: Secondary | ICD-10-CM | POA: Diagnosis not present

## 2021-07-24 DIAGNOSIS — G9340 Encephalopathy, unspecified: Secondary | ICD-10-CM | POA: Diagnosis not present

## 2021-07-24 DIAGNOSIS — N179 Acute kidney failure, unspecified: Secondary | ICD-10-CM

## 2021-07-24 DIAGNOSIS — G032 Benign recurrent meningitis [Mollaret]: Secondary | ICD-10-CM | POA: Diagnosis not present

## 2021-07-24 DIAGNOSIS — I10 Essential (primary) hypertension: Secondary | ICD-10-CM | POA: Diagnosis not present

## 2021-07-24 LAB — CBC WITH DIFFERENTIAL/PLATELET
Abs Immature Granulocytes: 0.03 10*3/uL (ref 0.00–0.07)
Basophils Absolute: 0.1 10*3/uL (ref 0.0–0.1)
Basophils Relative: 1 %
Eosinophils Absolute: 0.1 10*3/uL (ref 0.0–0.5)
Eosinophils Relative: 1 %
HCT: 44.5 % (ref 39.0–52.0)
Hemoglobin: 15.1 g/dL (ref 13.0–17.0)
Immature Granulocytes: 0 %
Lymphocytes Relative: 16 %
Lymphs Abs: 1.7 10*3/uL (ref 0.7–4.0)
MCH: 32.9 pg (ref 26.0–34.0)
MCHC: 33.9 g/dL (ref 30.0–36.0)
MCV: 96.9 fL (ref 80.0–100.0)
Monocytes Absolute: 0.9 10*3/uL (ref 0.1–1.0)
Monocytes Relative: 8 %
Neutro Abs: 7.5 10*3/uL (ref 1.7–7.7)
Neutrophils Relative %: 74 %
Platelets: 185 10*3/uL (ref 150–400)
RBC: 4.59 MIL/uL (ref 4.22–5.81)
RDW: 13.8 % (ref 11.5–15.5)
WBC: 10.3 10*3/uL (ref 4.0–10.5)
nRBC: 0 % (ref 0.0–0.2)

## 2021-07-24 LAB — GLUCOSE, CAPILLARY
Glucose-Capillary: 109 mg/dL — ABNORMAL HIGH (ref 70–99)
Glucose-Capillary: 111 mg/dL — ABNORMAL HIGH (ref 70–99)
Glucose-Capillary: 168 mg/dL — ABNORMAL HIGH (ref 70–99)
Glucose-Capillary: 194 mg/dL — ABNORMAL HIGH (ref 70–99)
Glucose-Capillary: 143 mg/dL — ABNORMAL HIGH (ref 70–99)
Glucose-Capillary: 185 mg/dL — ABNORMAL HIGH (ref 70–99)

## 2021-07-24 LAB — CSF CULTURE W GRAM STAIN: Culture: NO GROWTH

## 2021-07-24 LAB — COMPREHENSIVE METABOLIC PANEL
ALT: 45 U/L — ABNORMAL HIGH (ref 0–44)
AST: 25 U/L (ref 15–41)
Albumin: 3.7 g/dL (ref 3.5–5.0)
Alkaline Phosphatase: 53 U/L (ref 38–126)
Anion gap: 9 (ref 5–15)
BUN: 22 mg/dL (ref 8–23)
CO2: 24 mmol/L (ref 22–32)
Calcium: 9.8 mg/dL (ref 8.9–10.3)
Chloride: 102 mmol/L (ref 98–111)
Creatinine, Ser: 1.49 mg/dL — ABNORMAL HIGH (ref 0.61–1.24)
GFR, Estimated: 51 mL/min — ABNORMAL LOW (ref >60)
Glucose, Bld: 120 mg/dL — ABNORMAL HIGH (ref 70–99)
Potassium: 3.6 mmol/L (ref 3.5–5.1)
Sodium: 135 mmol/L (ref 135–145)
Total Bilirubin: 1 mg/dL (ref 0.3–1.2)
Total Protein: 7.6 g/dL (ref 6.5–8.1)

## 2021-07-24 LAB — HSV 1/2 PCR, CSF
HSV-1 DNA: NEGATIVE
HSV-2 DNA: NEGATIVE

## 2021-07-24 LAB — MAGNESIUM: Magnesium: 1.9 mg/dL (ref 1.7–2.4)

## 2021-07-24 LAB — ROCKY MTN SPOTTED FVR ABS PNL(IGG+IGM)
RMSF IgG: NEGATIVE
RMSF IgM: 0.71 index (ref 0.00–0.89)

## 2021-07-24 LAB — PHOSPHORUS: Phosphorus: 4 mg/dL (ref 2.5–4.6)

## 2021-07-24 MED ORDER — LORAZEPAM 2 MG/ML IJ SOLN: 2.0000 mg | Freq: Four times a day (QID) | INTRAMUSCULAR | Status: DC | PRN

## 2021-07-24 MED ORDER — HALOPERIDOL LACTATE 5 MG/ML IJ SOLN: 5.0000 mg | Freq: Four times a day (QID) | INTRAMUSCULAR | Status: DC | PRN

## 2021-07-24 MED ORDER — LORAZEPAM 1 MG PO TABS: 2.0000 mg | ORAL_TABLET | Freq: Four times a day (QID) | ORAL | Status: DC | PRN

## 2021-07-24 MED ORDER — LORAZEPAM 2 MG/ML IJ SOLN
2.0000 mg | Freq: Once | INTRAMUSCULAR | Status: AC
  Administered 2021-07-24: 2 mg via INTRAVENOUS
  Filled 2021-07-24: qty 1

## 2021-07-24 MED ORDER — HEPARIN SODIUM (PORCINE) 5000 UNIT/ML IJ SOLN
5000.0000 [IU] | Freq: Three times a day (TID) | INTRAMUSCULAR | Status: DC
  Administered 2021-07-24 – 2021-07-28 (×12): 5000 [IU] via SUBCUTANEOUS
  Filled 2021-07-24 (×11): qty 1

## 2021-07-24 MED ORDER — DIPHENHYDRAMINE HCL 50 MG/ML IJ SOLN
50.0000 mg | Freq: Once | INTRAMUSCULAR | Status: AC
  Administered 2021-07-24: 50 mg via INTRAVENOUS
  Filled 2021-07-24: qty 1

## 2021-07-24 MED ORDER — HALOPERIDOL 5 MG PO TABS: 5.0000 mg | ORAL_TABLET | Freq: Four times a day (QID) | ORAL | Status: DC | PRN

## 2021-07-24 MED ORDER — OLANZAPINE 5 MG PO TBDP
5.0000 mg | ORAL_TABLET | Freq: Every day | ORAL | Status: DC
  Administered 2021-07-24 – 2021-07-27 (×4): 5 mg via ORAL
  Filled 2021-07-24 (×6): qty 1

## 2021-07-24 MED ORDER — GADOBUTROL 1 MMOL/ML IV SOLN
10.0000 mL | Freq: Once | INTRAVENOUS | Status: AC | PRN
  Administered 2021-07-24: 10 mL via INTRAVENOUS

## 2021-07-24 NOTE — Progress Notes (Signed)
Pharmacy Antibiotic Note  18 yom presenting with confusion and aphasia. Patient recently admitted for similar presentation for which meningitis coverage was initiated. HSV PCR negative at that time. Pharmacy has been consulted for acyclovir dosing in the setting of concern for aseptic meningitis of non HSV origin.  Mental status has been variable. He is on day 4 of acyclovir. Creatinine has increased from 0.7 yesterday to 1.49 today. Note that it appears LR was discontinued 8/11 but has since been resumed on 8/12. CrCl remains >50 ml/min. No recent nephrotoxic agents. Will continue to dose off of actual body weight.    ID team is following and recommend treatment duration based on pending CSF HSV labs from 8/9.  Plan: Continue acyclovir '1000mg'$  q8hr unless change in renal function Continue LR @ 143m/hr Monitor renal function, UOP, clinical status  F/u HSV labs, send out labs  Height: '6\' 3"'$  (190.5 cm) Weight: 106.4 kg (234 lb 9.1 oz) IBW/kg (Calculated) : 84.5  Temp (24hrs), Avg:98 F (36.7 C), Min:97.4 F (36.3 C), Max:99.3 F (37.4 C)  Recent Labs  Lab 07/20/21 1631 07/20/21 1637 07/21/21 0050 07/22/21 0151 07/23/21 0439 07/24/21 0432  WBC 8.9  --  8.6 6.3 6.8 10.3  CREATININE 1.36* 1.20 1.21 1.02 0.97 1.49*    Estimated Creatinine Clearance: 63.5 mL/min (A) (by C-G formula based on SCr of 1.49 mg/dL (H)).    Allergies  Allergen Reactions   Lisinopril     hyperkalemia   Reglan [Metoclopramide] Anxiety    Patient had bad reaction requiring ativan in addition to benadryl. He doesn't want to receive it any more   Antimicrobials this admission: Acyclovir 8/9 >>   Microbiology results: 8/10 AFB: pending 8/10 CSF: ngx2 days 8/10 CSF fungus cx: ngx2 days  8/10 Crypto Ag: negative  RMSF IgG: negative  8/1 HSV1/2 from CSF: negative  8/1 VDRL non-reactive  Thank you for allowing pharmacy to be a part of this patient's care.  AMaryan CharLippucci, PharmD, BCPS 07/24/2021  8:45 AM

## 2021-07-24 NOTE — Consult Note (Signed)
[facility] Consult   Reason for Consult:  psychosis Referring Physician:  Kerney Elbe, DO Patient Identification: Jason [name] MRN:  [id number] Principal Diagnosis: Acute encephalopathy Diagnosis:  Principal Problem:   Acute encephalopathy Active Problems:   Essential hypertension   Type 2 diabetes mellitus ([place])   Mollaret's meningitis   Tick bite   Encephalopathy acute   CSF pleocytosis   Total Time spent with patient: 30 minutes  Subjective:   Jason [name] is a 65 y.o. male patient admitted with altered mental status with onset after suffering tick bite-see HPI per primary team below for full time line of events who is now admitted for ongoing AMS work up- patient now experiencing hallucinations prompting psychiatry consult. Per MAR, patient was agitated last night and received haldol and ativan; he was started on 25 mg seroquel qhs last night.   On assessment this morning, patient is found asleep in bed with friend Rickey Barbara present whom lives with patient. Patient is drowsy and intermittently falls asleep between questions. Pt Is AAO to person, place, time, and situation. He states that he oringinally presented to the hospital for headaches. He denies SI/HI/AVH. He denies psychiatric history, psychiatric hospitalizations or SA in the past. Interview is limited due to patient's drowsiness; however, patient's friend is present bedside to provide additional information-pt consents for me to speak with her.   Linnea affirms that patient does not have psychiatric history, has never had a SA and has not had a psychiatric hospitalization to her knowledge. She states that everything started the 29th of last month after Jason [name] was bit by a tick. After this occurred she states that he had times where he "looked strange" and she believed it was due to "bell's palsy spells" which he has had in the past and looked similiarly. He was admitted and then discharged last  Thursday. At this time he appeared to be doing well. However, he began to present with similar symptoms on Tuesday and since then it has been "down hill". She states that she spent the night at the hospital with him yesterday and that he has been hallucinating. She describes Aycen seeing things in the room that are not there. He has pointed at the floor when there is nothing there asking if she sees it, he said that he saw people in the room who are not there and told her that there is water coming out of the walls. To her knowledge there is no current substance use or history of substance use.  HPI per primary Team The patient is a 65 year old overweight African-American male with past medical history significant for but not limited to hypertension, diabetes mellitus type 2 who presented a couple weeks ago with a tick bite prior to his onset of symptoms.  He initially presented to the ED on 07/09/2021 -07/10/2021 with a 1 week history of headache progressing to confusion.  He had no reported fevers or chills and he was admitted to hospitalist service on 07/10/2021 until 07/15/2021 and work-up at that time was suggestive of aseptic/viral meningitis.  Initial CSF findings at that time was under 65 WBCs with 90% lymphs and he did have 81 protein in 60s of glucose at that time.  Cultures were negative and it is 1B and 2 CF cephalonegative.  Lyme CSF was negative and arm SF peripheral IgM were negative.  Initially was placed on broad-spectrum antibiotics with acyclovir and then just tapered acyclovir and then ultimately taken off for his HSV  came back negative.  Following his discharge he improved significantly but then had relapses with symptoms and had significant headache yesterday with no meningismus.  He had altered mental status and had word finding difficulty around 3 PM yesterday.  He returned to the ED for recurrent symptoms and denies any meningismus.  States his headache is now mild but he did have AMS and  lethargy and did wake up to answer questions.  In the ED he is afebrile with no evidence of SIRS criteria.  Neurology was consulted and initially concern for seizure so he was loaded with Keppra and he is given a migraine cocktail.  He underwent MRI testing which was unable to get T1-2 images but DWI were negative for acute stroke.  He was admitted for his acute encephalopathy and neurology recommended repeating a lumbar puncture and this was done by interventional radiology.  He underwent EEG monitoring which showed the study was within normal limits and no seizures or epileptiform discharges were seen throughout the recording.  Given his symptoms infectious diseases was also consulted and they recommended stopping doxycycline for now and continue acyclovir and checking a serum cryptococcal antigen as well as sending orders for CSF meningeal encephalitis PCR.  Because of his recurrence of symptoms neurology feels that he has aseptic meningitis that could be recurrent or could be moderate meningitis.  ID has been consulted and made the recommendations and because of his stereotactic nature, seizures are also included in differential.  Neurology recommending LTM.  LTM has now been discontinued given no seizures and they are recommending continuing acyclovir for empiric HSV meningitis coverage until meningeal encephalitis PCR results.  Cryptococcal antigen is negative   Now ID is ordering West Nile virus serology.  If the CSF PCR is negative for HSV then ID is recommending stopping acyclovir and then they can follow-up on the [place] serology in clinic on 07/30/2021 at 10:30 AM.  Patient was hallucinating today and because of this neurology is repeating his MRI of the brain with and without contrast  Past Psychiatric History: none  Risk to Self:   Risk to Others:   Prior Inpatient Therapy:   Prior Outpatient Therapy:    Past Medical History:  Past Medical History:  Diagnosis Date   Bell palsy     Diabetes mellitus without complication (HCC)    HTN (hypertension)     Past Surgical History:  Procedure Laterality Date   COLONOSCOPY W/ POLYPECTOMY     Family History:  Family History  Problem Relation Age of Onset   Stroke Mother    Colon cancer Sister    High blood pressure Sister    Colon cancer Brother    High blood pressure Brother    Stomach cancer Brother    Lymphoma Son    Colon cancer Other        1st degree relative <60   Diabetes Other        1st degree relative    Stroke Other    Esophageal cancer Neg Hx    Rectal cancer Neg Hx    Family Psychiatric  History:  Social History:  Social History   Substance and Sexual Activity  Alcohol Use Yes   Alcohol/week: 6.0 standard drinks   Types: 6 Cans of beer per week   Comment: on weekends     Social History   Substance and Sexual Activity  Drug Use No    Social History   Socioeconomic History   Marital status: Married  Spouse name: Not on file   Number of children: 2   Years of education: 12   Highest education level: Not on file  Occupational History   Occupation: transportation    Comment: Publishing rights manager  Tobacco Use   Smoking status: Never   Smokeless tobacco: Never  Vaping Use   Vaping Use: Never used  Substance and Sexual Activity   Alcohol use: Yes    Alcohol/week: 6.0 standard drinks    Types: 6 Cans of beer per week    Comment: on weekends   Drug use: No   Sexual activity: Not on file  Other Topics Concern   Not on file  Social History Narrative      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       Fun: fix cars, drag race    Social Determinants of Radio broadcast assistant Strain: Low Risk    Difficulty of Paying Living Expenses: Not hard at all  Food Insecurity: No Food Insecurity   Worried About Charity fundraiser in the Last Year: Never true   Arboriculturist in the Last Year: Never true  Transportation Needs: No  Transportation Needs   Lack of Transportation (Medical): No   Lack of Transportation (Non-Medical): No  Physical Activity: Insufficiently Active   Days of Exercise per Week: 3 days   Minutes of Exercise per Session: 30 min  Stress: No Stress Concern Present   Feeling of Stress : Not at all  Social Connections: Moderately Isolated   Frequency of Communication with Friends and Family: More than three times a week   Frequency of Social Gatherings with Friends and Family: More than three times a week   Attends Religious Services: More than 4 times per year   Active Member of [employer] or Organizations: No   Attends Archivist Meetings: Never   Marital Status: Widowed   Additional Social History:    Allergies:   Allergies  Allergen Reactions   Lisinopril     hyperkalemia   Reglan [Metoclopramide] Anxiety    Patient had bad reaction requiring ativan in addition to benadryl. He doesn't want to receive it any more    Labs:  Results for orders placed or performed during the hospital encounter of 07/20/21 (from the past 48 hour(s))  Glucose, capillary     Status: Abnormal   Collection Time: 07/22/21 11:37 AM  Result Value Ref Range   Glucose-Capillary 124 (H) 70 - 99 mg/dL    Comment: Glucose reference range applies only to samples taken after fasting for at least 8 hours.  Glucose, capillary     Status: Abnormal   Collection Time: 07/22/21  4:15 PM  Result Value Ref Range   Glucose-Capillary 188 (H) 70 - 99 mg/dL    Comment: Glucose reference range applies only to samples taken after fasting for at least 8 hours.  Glucose, capillary     Status: Abnormal   Collection Time: 07/22/21  8:41 PM  Result Value Ref Range   Glucose-Capillary 146 (H) 70 - 99 mg/dL    Comment: Glucose reference range applies only to samples taken after fasting for at least 8 hours.  Glucose, capillary     Status: Abnormal   Collection Time: 07/23/21 12:07 AM  Result Value Ref Range    Glucose-Capillary 123 (H) 70 - 99 mg/dL    Comment: Glucose reference range applies only  to samples taken after fasting for at least 8 hours.  Glucose, capillary     Status: Abnormal   Collection Time: 07/23/21  4:10 AM  Result Value Ref Range   Glucose-Capillary 132 (H) 70 - 99 mg/dL    Comment: Glucose reference range applies only to samples taken after fasting for at least 8 hours.  CBC with Differential/Platelet     Status: None   Collection Time: 07/23/21  4:39 AM  Result Value Ref Range   WBC 6.8 4.0 - 10.5 K/uL   RBC 4.81 4.22 - 5.81 MIL/uL   Hemoglobin 15.7 13.0 - 17.0 g/dL   HCT 46.1 39.0 - 52.0 %   MCV 95.8 80.0 - 100.0 fL   MCH 32.6 26.0 - 34.0 pg   MCHC 34.1 30.0 - 36.0 g/dL   RDW 13.8 11.5 - 15.5 %   Platelets 202 150 - 400 K/uL   nRBC 0.0 0.0 - 0.2 %   Neutrophils Relative % 49 %   Neutro Abs 3.3 1.7 - 7.7 K/uL   Lymphocytes Relative 41 %   Lymphs Abs 2.8 0.7 - 4.0 K/uL   Monocytes Relative 7 %   Monocytes Absolute 0.5 0.1 - 1.0 K/uL   Eosinophils Relative 2 %   Eosinophils Absolute 0.2 0.0 - 0.5 K/uL   Basophils Relative 1 %   Basophils Absolute 0.1 0.0 - 0.1 K/uL   Immature Granulocytes 0 %   Abs Immature Granulocytes 0.02 0.00 - 0.07 K/uL    Comment: Performed at [facility], [address] [place], [place] [address]  Comprehensive metabolic panel     Status: Abnormal   Collection Time: 07/23/21  4:39 AM  Result Value Ref Range   Sodium 134 (L) 135 - 145 mmol/L   Potassium 4.0 3.5 - 5.1 mmol/L   Chloride 102 98 - 111 mmol/L   CO2 25 22 - 32 mmol/L   Glucose, Bld 132 (H) 70 - 99 mg/dL    Comment: Glucose reference range applies only to samples taken after fasting for at least 8 hours.   BUN 18 8 - 23 mg/dL   Creatinine, Ser 0.97 0.61 - 1.24 mg/dL   Calcium 9.9 8.9 - 10.3 mg/dL   Total Protein 8.0 6.5 - 8.1 g/dL   Albumin 3.8 3.5 - 5.0 g/dL   AST 33 15 - 41 U/L   ALT 55 (H) 0 - 44 U/L   Alkaline Phosphatase 59 38 - 126 U/L   Total Bilirubin 0.6  0.3 - 1.2 mg/dL   GFR, Estimated >60 >60 mL/min    Comment: (NOTE) Calculated using the CKD-EPI Creatinine Equation (2021)    Anion gap 7 5 - 15    Comment: Performed at [facility] [address] [place], [place] [address]  Phosphorus     Status: None   Collection Time: 07/23/21  4:39 AM  Result Value Ref Range   Phosphorus 3.1 2.5 - 4.6 mg/dL    Comment: Performed at [facility] [address], [place],  [address]  Magnesium     Status: None   Collection Time: 07/23/21  4:39 AM  Result Value Ref Range   Magnesium 2.0 1.7 - 2.4 mg/dL    Comment: Performed at [facility] [address], [place], [place] [address]  Glucose, capillary     Status: Abnormal   Collection Time: 07/23/21  8:11 AM  Result Value Ref Range   Glucose-Capillary 139 (H) 70 -  99 mg/dL    Comment: Glucose reference range applies only to samples taken after fasting for at least 8 hours.  Glucose, capillary     Status: Abnormal   Collection Time: 07/23/21 11:37 AM  Result Value Ref Range   Glucose-Capillary 136 (H) 70 - 99 mg/dL    Comment: Glucose reference range applies only to samples taken after fasting for at least 8 hours.  Glucose, capillary     Status: Abnormal   Collection Time: 07/23/21  5:36 PM  Result Value Ref Range   Glucose-Capillary 192 (H) 70 - 99 mg/dL    Comment: Glucose reference range applies only to samples taken after fasting for at least 8 hours.  Glucose, capillary     Status: Abnormal   Collection Time: 07/23/21  8:16 PM  Result Value Ref Range   Glucose-Capillary 210 (H) 70 - 99 mg/dL    Comment: Glucose reference range applies only to samples taken after fasting for at least 8 hours.   Comment 1 Notify RN    Comment 2 Document in Chart   Glucose, capillary     Status: Abnormal   Collection Time: 07/24/21  1:03 AM  Result Value Ref Range   Glucose-Capillary 109 (H) 70 - 99 mg/dL    Comment: Glucose reference range applies only to samples taken after  fasting for at least 8 hours.  Glucose, capillary     Status: Abnormal   Collection Time: 07/24/21  4:17 AM  Result Value Ref Range   Glucose-Capillary 111 (H) 70 - 99 mg/dL    Comment: Glucose reference range applies only to samples taken after fasting for at least 8 hours.   Comment 1 Notify RN    Comment 2 Document in Chart   CBC with Differential/Platelet     Status: None   Collection Time: 07/24/21  4:32 AM  Result Value Ref Range   WBC 10.3 4.0 - 10.5 K/uL   RBC 4.59 4.22 - 5.81 MIL/uL   Hemoglobin 15.1 13.0 - 17.0 g/dL   HCT 44.5 39.0 - 52.0 %   MCV 96.9 80.0 - 100.0 fL   MCH 32.9 26.0 - 34.0 pg   MCHC 33.9 30.0 - 36.0 g/dL   RDW 13.8 11.5 - 15.5 %   Platelets 185 150 - 400 K/uL   nRBC 0.0 0.0 - 0.2 %   Neutrophils Relative % 74 %   Neutro Abs 7.5 1.7 - 7.7 K/uL   Lymphocytes Relative 16 %   Lymphs Abs 1.7 0.7 - 4.0 K/uL   Monocytes Relative 8 %   Monocytes Absolute 0.9 0.1 - 1.0 K/uL   Eosinophils Relative 1 %   Eosinophils Absolute 0.1 0.0 - 0.5 K/uL   Basophils Relative 1 %   Basophils Absolute 0.1 0.0 - 0.1 K/uL   Immature Granulocytes 0 %   Abs Immature Granulocytes 0.03 0.00 - 0.07 K/uL    Comment: Performed at [facility], [address] [place], [place] [address]  Comprehensive metabolic panel     Status: Abnormal   Collection Time: 07/24/21  4:32 AM  Result Value Ref Range   Sodium 135 135 - 145 mmol/L   Potassium 3.6 3.5 - 5.1 mmol/L   Chloride 102 98 - 111 mmol/L   CO2 24 22 - 32 mmol/L   Glucose, Bld 120 (H) 70 - 99 mg/dL    Comment: Glucose reference range applies only to samples taken after fasting for at least 8 hours.   BUN  22 8 - 23 mg/dL   Creatinine, Ser 1.49 (H) 0.61 - 1.24 mg/dL   Calcium 9.8 8.9 - 10.3 mg/dL   Total Protein 7.6 6.5 - 8.1 g/dL   Albumin 3.7 3.5 - 5.0 g/dL   AST 25 15 - 41 U/L   ALT 45 (H) 0 - 44 U/L   Alkaline Phosphatase 53 38 - 126 U/L   Total Bilirubin 1.0 0.3 - 1.2 mg/dL   GFR, Estimated 51 (L) >60 mL/min     Comment: (NOTE) Calculated using the CKD-EPI Creatinine Equation (2021)    Anion gap 9 5 - 15    Comment: Performed at [facility] [address] [place], [place] [address]  Magnesium     Status: None   Collection Time: 07/24/21  4:32 AM  Result Value Ref Range   Magnesium 1.9 1.7 - 2.4 mg/dL    Comment: Performed at [facility] [address] [place], [place] [address]  Phosphorus     Status: None   Collection Time: 07/24/21  4:32 AM  Result Value Ref Range   Phosphorus 4.0 2.5 - 4.6 mg/dL    Comment: Performed at [facility] [address] [place], [place] [address]  Glucose, capillary     Status: Abnormal   Collection Time: 07/24/21  7:51 AM  Result Value Ref Range   Glucose-Capillary 168 (H) 70 - 99 mg/dL    Comment: Glucose reference range applies only to samples taken after fasting for at least 8 hours.    Current Facility-Administered Medications  Medication Dose Route Frequency Provider Last Rate Last Admin   acetaminophen (TYLENOL) tablet 650 mg  650 mg Oral Q6H PRN Etta Quill, DO       Or   acetaminophen (TYLENOL) suppository 650 mg  650 mg Rectal Q6H PRN Etta Quill, DO       acyclovir (ZOVIRAX) 1,000 mg in dextrose 5 % 150 mL IVPB  1,000 mg Intravenous Q8H Heloise Purpura, RPH 170 mL/hr at 07/24/21 0542 1,000 mg at 07/24/21 0542   cyanocobalamin ((VITAMIN B-12)) injection 1,000 mcg  1,000 mcg Intramuscular Once Etta Quill, DO       haloperidol lactate (HALDOL) injection 2 mg  2 mg Intramuscular Q6H PRN Raiford Noble Latif, DO   2 mg at 07/23/21 1744   heparin injection 5,000 Units  5,000 Units Subcutaneous Q8H Sheikh, Omair [place], DO       insulin aspart (novoLOG) injection 0-9 Units  0-9 Units Subcutaneous Q4H Etta Quill, DO   2 Units at 07/24/21 [id number]   lactated ringers infusion   Intravenous Continuous Raiford Noble [place], DO 125 mL/hr at 07/24/21 0841 New Bag at 07/24/21 0841   ondansetron (ZOFRAN) tablet 4 mg  4 mg  Oral Q6H PRN Etta Quill, DO       Or   ondansetron [facility]) injection 4 mg  4 mg Intravenous Q6H PRN Etta Quill, DO   4 mg at 07/23/21 2137   QUEtiapine (SEROQUEL) tablet 25 mg  25 mg Oral QHS SheikhGeorgina Quint [place], DO   25 mg at 07/23/21 2140    Musculoskeletal: Strength & Muscle Tone:  did not assess, patient laying in bed Gait & Station:  did not assess, patient laying in bed Patient leans:  did not assess, patient laying in bed            Psychiatric Specialty Exam:  Presentation  General Appearance: Appropriate for Environment;  Casual  Eye Contact:Minimal; Other (comment) (patient briefly opens eyes to make eye contact upon introduction but otherwise does not open eyes throughout assessment)  Speech:Normal Rate  Speech Volume:Normal  Handedness: No data recorded  Mood and Affect  Mood:Euthymic (patient describes mood as "alright")  Affect:Congruent; Constricted   Thought Process  Thought Processes:Linear; Other (comment) (patient is drowsy at time and has difficulty engaging in full conversation, although appears to be at least superficially linear)  Descriptions of Associations:Intact  Orientation:Full (Time, Place and Person) (AAO to [employer], month and year)  Thought Content:Logical  History of Schizophrenia/Schizoaffective disorder:No data recorded Duration of Psychotic Symptoms:No data recorded Hallucinations:Hallucinations: None (patient denies)  Ideas of Reference:None  Suicidal Thoughts:Suicidal Thoughts: No  Homicidal Thoughts:Homicidal Thoughts: No   Sensorium  Memory:Immediate Fair; Recent Fair; Remote Poor  Judgment:Other (comment) (unable to completely assess due to limited participateion)  Insight: No data recorded  Executive Functions  Concentration:Poor (intermittently falls asleep between questions)  Attention Span:Poor; Other (comment) (intermittently falls asleep between questions)  Recall:Other  (comment) (intermittently falls asleep between questions, limited interview)  Firth (comment) (UTA-intermittently falls asleep between questions during interview)  Language:Fair   Psychomotor Activity  Psychomotor Activity:Psychomotor Activity: Normal   Assets  Assets:Communication Skills; Desire for Improvement; Resilience; Housing   Sleep  Sleep:Sleep: -- (UTA-patient intermittently falling asleep between questions)   Physical Exam: Physical Exam Constitutional:      Appearance: Normal appearance.  HENT:     Head: Normocephalic and atraumatic.  Pulmonary:     Effort: Pulmonary effort is normal.  Neurological:     Mental Status: He is alert.   Review of Systems  Psychiatric/Behavioral:  Negative for depression, hallucinations, substance abuse and suicidal ideas.        Patient denies hallucination but per collateral account patient has been actively hallucinating  Blood pressure (!) 146/82, pulse 91, temperature 99.3 F (37.4 C), temperature source Oral, resp. rate 20, height '6\' 3"'$  (1.905 m), weight 106.4 kg, SpO2 97 %. Body mass index is 29.32 kg/m.  Treatment Plan Summary:   Hallucinations -patient with Acute encephalopathy with Headache and periods of aphasia and confusion with Suspicion for Aseptic Meningitis per primary team -patient with no previous psychiatric history-suspect that hallucinations may be associated with delirium and/or meningitis -patient was started on seroquel 25 mg qhs and received PRN haldol and ativan for agitation -Ekg 8/9-qtc 447 -recommend to ger repeat EKG -recommend to stop seroquel and start zyprexa 5 mg qhs -haldol 5 mg and ativan 2 mg PO or IM q6 hours for agitation.  -Psychiatry will continue to follow  -recommendations communicated to primary team via epic secure chat  Disposition:  Patient is unlikely to need psychiatric hospitalization as psychotic sx of hallucinations appear to be related to medical  illness   Ival Bible, MD 07/24/2021 11:30 AM

## 2021-07-24 NOTE — Progress Notes (Signed)
Neurology Progress Note   S:// Seen and examined. Got very agitated yesterday requiring IVC-started on Zyprexa   O:// Current vital signs: BP (!) 142/89 (BP Location: Right Arm)   Pulse 77   Temp 98.2 F (36.8 C) (Oral)   Resp 13   Ht '6\' 3"'$  (1.905 m)   Wt 106.4 kg   SpO2 100%   BMI 29.32 kg/m  Vital signs in last 24 hours: Temp:  [97.8 F (36.6 C)-99.3 F (37.4 C)] 98.2 F (36.8 C) (08/13 1558) Pulse Rate:  [77-118] 77 (08/13 1558) Resp:  [13-25] 13 (08/13 1558) BP: (118-147)/(68-89) 142/89 (08/13 1558) SpO2:  [94 %-100 %] 100 % (08/13 1558) GENERAL: Awake, alert in NAD HEENT: - Normocephalic and atraumatic, dry mm, no LN++, no Thyromegally LUNGS - Clear to auscultation bilaterally with no wheezes CV - S1S2 RRR, no m/r/g, equal pulses bilaterally. ABDOMEN - Soft, nontender, nondistended with normoactive BS Ext: warm, well perfused, intact peripheral pulses, no edema NEURO:  Mental Status: AA&Ox3  Language: speech is clear.  Naming, repetition, fluency, and comprehension intact although he does have some hesitancy naming at times. Knows he is in hospital for hisheadaches but not very attentive Cranial Nerves: PERRL EOMI, visual fields full, no facial asymmetry, facial sensation intact, hearing intact, tongue/uvula/soft palate midline, normal sternocleidomastoid and trapezius muscle strength. No evidence of tongue atrophy or fibrillations Motor: 5/5 in all 4s Tone: is normal and bulk is normal Sensation- Intact to light touch bilaterally Coordination: FTN intact bilaterally, no ataxia in BLE. Gait-normal Unchanged exam  Medications  Current Facility-Administered Medications:    acetaminophen (TYLENOL) tablet 650 mg, 650 mg, Oral, Q6H PRN **OR** acetaminophen (TYLENOL) suppository 650 mg, 650 mg, Rectal, Q6H PRN, Alcario Drought, Jared M, DO   cyanocobalamin ((VITAMIN B-12)) injection 1,000 mcg, 1,000 mcg, Intramuscular, Once, Alcario Drought, Jared M, DO   haloperidol (HALDOL) tablet  5 mg, 5 mg, Oral, Q6H PRN **OR** haloperidol lactate (HALDOL) injection 5 mg, 5 mg, Intramuscular, Q6H PRN, Ival Bible, MD   heparin injection 5,000 Units, 5,000 Units, Subcutaneous, Q8H, Sheikh, Omair Latif, DO, 5,000 Units at 07/24/21 1502   insulin aspart (novoLOG) injection 0-9 Units, 0-9 Units, Subcutaneous, Q4H, Alcario Drought, Jared M, DO, 1 Units at 07/24/21 1619   lactated ringers infusion, , Intravenous, Continuous, Raiford Noble Sharpes, [place], Last Rate: 125 mL/hr at 07/24/21 0841, New Bag at 07/24/21 0841   LORazepam (ATIVAN) tablet 2 mg, 2 mg, Oral, Q6H PRN **OR** LORazepam (ATIVAN) injection 2 mg, 2 mg, Intramuscular, Q6H PRN, Ival Bible, MD   OLANZapine zydis (ZYPREXA) disintegrating tablet 5 mg, 5 mg, Oral, QHS, Ival Bible, MD   ondansetron [facility]) tablet 4 mg, 4 mg, Oral, Q6H PRN **OR** ondansetron (ZOFRAN) injection 4 mg, 4 mg, Intravenous, Q6H PRN, Etta Quill, DO, 4 mg at 07/23/21 2137 Labs CBC    Component Value Date/Time   WBC 10.3 07/24/2021 0432   RBC 4.59 07/24/2021 0432   HGB 15.1 07/24/2021 0432   HCT 44.5 07/24/2021 0432   PLT 185 07/24/2021 0432   MCV 96.9 07/24/2021 0432   MCH 32.9 07/24/2021 0432   MCHC 33.9 07/24/2021 0432   RDW 13.8 07/24/2021 0432   LYMPHSABS 1.7 07/24/2021 0432   MONOABS 0.9 07/24/2021 0432   EOSABS 0.1 07/24/2021 0432   BASOSABS 0.1 07/24/2021 0432    CMP     Component Value Date/Time   NA 135 07/24/2021 0432   K 3.6 07/24/2021 0432   CL 102 07/24/2021 0432  CO2 24 07/24/2021 0432   GLUCOSE 120 (H) 07/24/2021 0432   BUN 22 07/24/2021 0432   CREATININE 1.49 (H) 07/24/2021 0432   CALCIUM 9.8 07/24/2021 0432   PROT 7.6 07/24/2021 0432   ALBUMIN 3.7 07/24/2021 0432   AST 25 07/24/2021 0432   ALT 45 (H) 07/24/2021 0432   ALKPHOS 53 07/24/2021 0432   BILITOT 1.0 07/24/2021 0432   GFRNONAA 51 (L) 07/24/2021 0432   GFRAA >60 05/25/2011 0449   Routine EEG-normal HSV PCR negative on 07/12/2021.  Most  recent CSF HSV PCR pending  Imaging I have reviewed images in epic and the results pertinent to this consultation are: MRI brain limited-no stroke  Assessment: 65 year old man presenting for aphasia and confusion-stereotypical episodes-previously seen for this last week and diagnosed with aseptic meningitis.  Stat MRI negative for stroke. Continues to complain of intermittent headaches and spells of confusion. Underwent a spinal tap with CSF revealing 26 red cells, 105 WBCs of which 96% and lymphocytes 4% monocytes 0% segmented neutrophils, total protein of 80, glucose of 53. This is very similar to the previous spinal tap with WBC count of 165 with 98% lymphocytes and a total protein of 81. Suspect aseptic meningitis.  Could be recurrent aseptic meningitis/Mollarets meningitis ID has been consulted-appreciate involvement. Due to stereotypic nature-seizure should also be considered in differentials for which we did prolonged EEG with no evidence of seizure. HSV PCR in CSF negative  Recommendations: -HSV PCR negative-acyclovir discontinued -ID has sent meningoencephalitis panel that includes 7 bacteria and 7 viruses-it is a send out test and will take some time to come back.  Cryptococcal antigen also ordered. -No need for bacterial meningitic coverage based on the CSF findings. -Symptomatic treatment of headache as needed -Continue B12 replenishment -No AEDs for now -Given the fact that his prior MRI scans have been without contrast or limited sequence to rule out stroke, I will recommend getting an MRI of the brain with and without contrast which I have ordered.  Unable to tolerate yesterday-we will try when more stable -Appreciate psychiatry assistance -Adding autoimmune encephalopathy/paraneoplastic evaluation-[facility] panel to the CSF [email or web address]  Will follow. Discussed in detail with family. Discussed with Dr.  Shelton Silvas  -- Amie Portland, MD Neurologist [facility] Pager: [phone]

## 2021-07-24 NOTE — Progress Notes (Signed)
PROGRESS NOTE    Jason [name]  [id number] DOB: Apr 18, 1956 DOA: 07/20/2021 PCP: [name], NP   Brief Narrative:  The patient is a 65 year old overweight African-American male with past medical history significant for but not limited to hypertension, diabetes mellitus type 2 who presented a couple weeks ago with a tick bite prior to his onset of symptoms.  He initially presented to the ED on 07/09/2021 -07/10/2021 with a 1 week history of headache progressing to confusion.  He had no reported fevers or chills and he was admitted to hospitalist service on 07/10/2021 until 07/15/2021 and work-up at that time was suggestive of aseptic/viral meningitis.  Initial CSF findings at that time was under 65 WBCs with 90% lymphs and he did have 81 protein in 60s of glucose at that time.  Cultures were negative and it is 1B and 2 CF cephalonegative.  Lyme CSF was negative and arm SF peripheral IgM were negative.  Initially was placed on broad-spectrum antibiotics with acyclovir and then just tapered acyclovir and then ultimately taken off for his HSV came back negative.  Following his discharge he improved significantly but then had relapses with symptoms and had significant headache yesterday with no meningismus.  He had altered mental status and had word finding difficulty around 3 PM yesterday.  He returned to the ED for recurrent symptoms and denies any meningismus.  States his headache is now mild but he did have AMS and lethargy and did wake up to answer questions.  In the ED he is afebrile with no evidence of SIRS criteria.  Neurology was consulted and initially concern for seizure so he was loaded with Keppra and he is given a migraine cocktail.  He underwent MRI testing which was unable to get T1-2 images but DWI were negative for acute stroke.  He was admitted for his acute encephalopathy and neurology recommended repeating a lumbar puncture and this was done by interventional radiology.  He underwent  EEG monitoring which showed the study was within normal limits and no seizures or epileptiform discharges were seen throughout the recording.  Given his symptoms infectious diseases was also consulted and they recommended stopping doxycycline for now and continue acyclovir and checking a serum cryptococcal antigen as well as sending orders for CSF meningeal encephalitis PCR.  Because of his recurrence of symptoms neurology feels that he has aseptic meningitis that could be recurrent or could be moderate meningitis.  ID has been consulted and made the recommendations and because of his stereotactic nature, seizures are also included in differential.  Neurology recommending LTM.  LTM has now been discontinued given no seizures and they are recommending continuing acyclovir for empiric HSV meningitis coverage until meningeal encephalitis PCR results.  Cryptococcal antigen is negative  Now ID is ordering [place] Nile virus serology.  If the CSF PCR is negative for HSV then ID is recommending stopping acyclovir and then they can follow-up on the [place] serology in clinic on 07/30/2021 at 10:30 AM.  Patient was hallucinating today and because of this neurology is repeating his MRI of the brain with and without contrast  Yesterday unfortunately he was not able to tolerate his MRI with the brain with and without contrast and became extremely agitated and confused and tried to walk out of the hospital.  He became combative and he was then subsequently IVC'd and became too combative that to the point where he needed a sitter and had to put him in four-point restraints.  Psych was consulted  for his agitation and hallucinations obtained from the Haldol and Ativan.  He was calmer this morning but MRI still pending.  Assessment & Plan:   Principal Problem:   Acute encephalopathy Active Problems:   Essential hypertension   Type 2 diabetes mellitus (HCC)   Mollaret's meningitis   Tick bite   Encephalopathy acute    CSF pleocytosis  Acute encephalopathy with Headache and periods of aphasia and confusion with Suspicion for Aseptic Meningitis, improving but now he is hallucinating -Presentation is suspicious for recurrent aseptic meningitis versus Mollarets meningitis -Neurology has loaded the patient with antiepileptics and obtaining an EEG and LTM but these were negative so now LTM has been discontinued -He is continuing to get B12 replacement -Obtain another LP and cytology and repeat spinal tap showed a CSF of 26 red cells, 105 WBCs which are 96% lymphocytes, 4% monocytes and 0% segmented neutrophils, total protein of 80 and a glucose of 53 which was similar to last week's presentation -Infectious diseases has been consulted and recommending continuing empiric acyclovir for HSV Menigitis Coverage until PCR results Negative (Meningeal Encephalitis Panel has been sent) and stopping the empiric doxycycline for his history of tick bite -Labs have been sent out and he is going to get a cell count and differential as well as the labs ordered by infectious diseases -Neurology recommends no need for bacterial meningitic coverage based on his CSF findings and continuing symptomatic treatment of his headache as needed -We discontinued IV fluids yesterday however will resume with LR at 75 mils per hour given that he is on acyclovir and will try and prevent AIN -Given Levetiracetam 3000 mg x1 in the ED  -Was on LTM and now discontinued given that the study was within normal limits and no seizures or epileptiform discharges were seen throughout the recording -ID is now sending out the [place] Nile serology -Patient was hallucinating yesterday because he was hallucinating and the neurology team is recommending a MRI of the brain with and without contrast but was unable to be done given his agitation and combativeness -Patient tried to leave the hospital being significantly confused and he was very combative and agitated.  He  had to be involuntary committed and had to be placed in restraints as well as obtain a sitter -Psychiatry was consulted and they recommended p.o. IM Haldol 5 mg and p.o./IM Ativan 2 mg every 6 for agitation and they have stopped his Seroquel and start Zyprexa 5 mg p.o. nightly -MRI with and without contrast still pending to be done  AKI -Likely worsened in the setting of acyclovir -Patient's BUNs/creatinine went from 20/1.02 -> 19/0.97 -> 22/1.49 -Continue with IV fluid hydration with LR at 125 MLS per hour -Avoid further nephrotoxic medications, contrast dyes, hypotension renally dose medications and will try some furosemide tomorrow if he continues to have an AKI to try to clear out the crystals IV from the acyclovir -Repeat CMP in a.m.  Diabetes mellitus type 2 -Continue to hold home medications  -Continue with sensitive sliding scale insulin every 4 and changed before meals and at bedtime in the morning -Continue monitor blood sugars and ranging from 109-194  Elevated ALT -Mild. ALT was 49 -> 55 -> 45 -Continue to Monitor and Trend -Repeat CMP in the AM  Hypertension -Continue to monitor blood pressures per protocol -Last blood pressure was 142/89 -If necessary will place on as needed hydralazine  Overweight -Estimated body mass index is 29.32 kg/m as calculated from the following:   Height  as of this encounter: '6\' 3"'$  (1.905 m).   Weight as of this encounter: 106.4 kg. -Weight loss and dietary counseling given  DVT prophylaxis: SCDs Code Status: FULL CODE Family Communication: Discussed with significant other at bedside Disposition Plan: Pending further clinical improvement by specialists  Status is: Inpatient  Remains inpatient appropriate because:Unsafe d/c plan, IV treatments appropriate due to intensity of illness or inability to take PO, and Inpatient level of care appropriate due to severity of illness  Dispo: The patient is from: Home              Anticipated d/c  is to: Home              Patient currently is not medically stable to d/c.   Difficult to place patient No  Consultants:  ID Neurology  Procedures: Lumbar Puncture  Antimicrobials:  Anti-infectives (From admission, onward)    Start     Dose/Rate Route Frequency Ordered Stop   07/20/21 2200  acyclovir (ZOVIRAX) 1,000 mg in dextrose 5 % 150 mL IVPB  Status:  Discontinued        1,000 mg 170 mL/hr over 60 Minutes Intravenous Every 8 hours 07/20/21 2029 07/24/21 1216   07/20/21 2115  doxycycline (VIBRAMYCIN) 100 mg in sodium chloride 0.9 % 250 mL IVPB  Status:  Discontinued        100 mg 125 mL/hr over 120 Minutes Intravenous Every 12 hours 07/20/21 2107 07/21/21 1039        Subjective: Seen and examined at bedside he was extremely agitated and confused last night but is more calm and awake and alert today but this is waxing and waning.  He is now eating breakfast.  He now has a small AKI.  No chest pain or shortness of breath noted.  No other concerns or complaints at this time and still awaiting his MRI  Objective: Vitals:   07/24/21 0415 07/24/21 0747 07/24/21 1131 07/24/21 1558  BP: (!) 147/75 (!) 146/82 126/86 (!) 142/89  Pulse: 87 91 (!) 108 77  Resp: (!) '21 20 19 13  '$ Temp: 98 F (36.7 C) 99.3 F (37.4 C) 99.3 F (37.4 C) 98.2 F (36.8 C)  TempSrc: Axillary Oral Oral Oral  SpO2: 95% 97% 97% 100%  Weight:      Height:        Intake/Output Summary (Last 24 hours) at 07/24/2021 1825 Last data filed at 07/24/2021 1740 Gross per 24 hour  Intake 1460.9 ml  Output 500 ml  Net 960.9 ml    Filed Weights   07/20/21 1632  Weight: 106.4 kg   Examination: Physical Exam:  Constitutional: WN/WD overweight African-American male currently in no acute distress appears calm and comfortable currently Eyes: Lids and conjunctivae normal, sclerae anicteric  ENMT: External Ears, Nose appear normal. Grossly normal hearing.  Neck: Appears normal, supple, no cervical masses, normal  ROM, no appreciable thyromegaly no JVD Respiratory: Clear to auscultation bilaterally, no wheezing, rales, rhonchi or crackles. Normal respiratory effort and patient is not tachypenic. No accessory muscle use.  Unlabored breathing Cardiovascular: RRR, no murmurs / rubs / gallops. S1 and S2 auscultated.  Mild extremity edema Abdomen: Soft, non-tender, distended second body habitus. Bowel sounds positive.  GU: Deferred. Musculoskeletal: No clubbing / cyanosis of digits/nails. No joint deformity upper and lower extremities.  Skin: No rashes, lesions, ulcers on limited skin evaluation. No induration; Warm and dry.  Neurologic: CN 2-12 grossly intact with no focal deficits.  Romberg sign cerebellar reflexes not  assessed.  Psychiatric: Normal judgment and insight. Alert and oriented x 3 currently. Normal mood and appropriate affect.   Data Reviewed: I have personally reviewed following labs and imaging studies  CBC: Recent Labs  Lab 07/20/21 1631 07/20/21 1637 07/21/21 0050 07/22/21 0151 07/23/21 0439 07/24/21 0432  WBC 8.9  --  8.6 6.3 6.8 10.3  NEUTROABS 5.1  --   --  3.1 3.3 7.5  HGB 15.6 15.6 15.2 15.2 15.7 15.1  HCT 47.1 46.0 45.3 44.7 46.1 44.5  MCV 98.5  --  98.1 96.8 95.8 96.9  PLT 186  --  185 182 202 [id number]    Basic Metabolic Panel: Recent Labs  Lab 07/20/21 1631 07/20/21 1637 07/21/21 0050 07/22/21 0151 07/23/21 0439 07/24/21 0432  NA 138 141 138 136 134* 135  K 3.7 3.9 4.7 4.1 4.0 3.6  CL 102 109 105 103 102 102  CO2 21*  --  '22 24 25 24  '$ GLUCOSE 137* 135* 103* 106* 132* 120*  BUN 20 24* '20 20 18 22  '$ CREATININE 1.36* 1.20 1.21 1.02 0.97 1.49*  CALCIUM 10.3  --  9.9 9.6 9.9 9.8  MG  --   --   --  2.1 2.0 1.9  PHOS  --   --   --  2.9 3.1 4.0    GFR: Estimated Creatinine Clearance: 63.5 mL/min (A) (by C-G formula based on SCr of 1.49 mg/dL (H)). Liver Function Tests: Recent Labs  Lab 07/20/21 1631 07/22/21 0151 07/23/21 0439 07/24/21 0432  AST 37 27 33 25   ALT 62* 49* 55* 45*  ALKPHOS 63 54 59 53  BILITOT 0.7 0.4 0.6 1.0  PROT 7.9 7.7 8.0 7.6  ALBUMIN 4.0 3.6 3.8 3.7    No results for input(s): LIPASE, AMYLASE in the last 168 hours. No results for input(s): AMMONIA in the last 168 hours. Coagulation Profile: Recent Labs  Lab 07/20/21 1631  INR 1.1    Cardiac Enzymes: No results for input(s): CKTOTAL, CKMB, CKMBINDEX, TROPONINI in the last 168 hours. BNP (last 3 results) No results for input(s): PROBNP in the last 8760 hours. HbA1C: No results for input(s): HGBA1C in the last 72 hours. CBG: Recent Labs  Lab 07/24/21 0103 07/24/21 0417 07/24/21 0751 07/24/21 1124 07/24/21 1604  GLUCAP 109* 111* 168* 194* 143*    Lipid Profile: No results for input(s): CHOL, HDL, LDLCALC, TRIG, CHOLHDL, LDLDIRECT in the last 72 hours. Thyroid Function Tests: No results for input(s): TSH, T4TOTAL, FREET4, T3FREE, THYROIDAB in the last 72 hours. Anemia Panel: No results for input(s): VITAMINB12, FOLATE, FERRITIN, TIBC, IRON, RETICCTPCT in the last 72 hours. Sepsis Labs: No results for input(s): PROCALCITON, LATICACIDVEN in the last 168 hours.  Recent Results (from the past 240 hour(s))  Resp Panel by RT-PCR (Flu A&B, Covid) Nasopharyngeal Swab     Status: None   Collection Time: 07/20/21  5:09 PM   Specimen: Nasopharyngeal Swab; Nasopharyngeal(NP) swabs in vial transport medium  Result Value Ref Range Status   SARS Coronavirus 2 by RT PCR NEGATIVE NEGATIVE Final    Comment: (NOTE) SARS-CoV-2 target nucleic acids are NOT DETECTED.  The SARS-CoV-2 RNA is generally detectable in upper respiratory specimens during the acute phase of infection. The lowest concentration of SARS-CoV-2 viral copies this assay can detect is 138 copies/mL. A negative result does not preclude SARS-Cov-2 infection and should not be used as the sole basis for treatment or other patient management decisions. A negative result may occur with  improper specimen  collection/handling, submission of specimen other than nasopharyngeal swab, presence of viral mutation(s) within the areas targeted by this assay, and inadequate number of viral copies(<138 copies/mL). A negative result must be combined with clinical observations, patient history, and epidemiological information. The expected result is Negative.  Fact Sheet for Patients:  EntrepreneurPulse.com.au  Fact Sheet for Healthcare Providers:  IncredibleEmployment.be  This test is no t yet approved or cleared by the [place] FDA and  has been authorized for detection and/or diagnosis of SARS-CoV-2 by FDA under an Emergency Use Authorization (EUA). This EUA will remain  in effect (meaning this test can be used) for the duration of the COVID-19 declaration under Section 564(b)(1) of the Act, 21 U.S.C.section 360bbb-3(b)(1), unless the authorization is terminated  or revoked sooner.       Influenza A by PCR NEGATIVE NEGATIVE Final   Influenza B by PCR NEGATIVE NEGATIVE Final    Comment: (NOTE) The Xpert Xpress SARS-CoV-2/FLU/RSV plus assay is intended as an aid in the diagnosis of influenza from Nasopharyngeal swab specimens and should not be used as a sole basis for treatment. Nasal washings and aspirates are unacceptable for Xpert Xpress SARS-CoV-2/FLU/RSV testing.  Fact Sheet for Patients: EntrepreneurPulse.com.au  Fact Sheet for Healthcare Providers: IncredibleEmployment.be  This test is not yet approved or cleared by the [place] FDA and has been authorized for detection and/or diagnosis of SARS-CoV-2 by FDA under an Emergency Use Authorization (EUA). This EUA will remain in effect (meaning this test can be used) for the duration of the COVID-19 declaration under Section 564(b)(1) of the Act, 21 U.S.C. section 360bbb-3(b)(1), unless the authorization is terminated or revoked.  Performed at [facility], [place] [address], [place], [place] [address]   CSF culture w Gram Stain     Status: None   Collection Time: 07/21/21  9:08 AM   Specimen: PATH Cytology CSF; Cerebrospinal Fluid  Result Value Ref Range Status   Specimen Description CSF  Final   Special Requests NONE  Final   Gram Stain   Final    CYTOSPIN SMEAR WBC PRESENT, PREDOMINANTLY MONONUCLEAR NO ORGANISMS SEEN    Culture   Final    NO GROWTH 3 DAYS Performed at [facility], [place] [address] [place], [place] [address]    Report Status 07/24/2021 FINAL  Final  Culture, fungus without smear     Status: None (Preliminary result)   Collection Time: 07/21/21  9:08 AM   Specimen: PATH Cytology CSF; Cerebrospinal Fluid  Result Value Ref Range Status   Specimen Description CSF  Final   Special Requests NONE  Final   Culture   Final    NO FUNGUS ISOLATED AFTER 3 DAYS Performed at [facility], [place] [address] [place], [place] [address]    Report Status PENDING  Incomplete     RN Pressure Injury Documentation:     Estimated body mass index is 29.32 kg/m as calculated from the following:   Height as of this encounter: '6\' 3"'$  (1.905 m).   Weight as of this encounter: 106.4 kg.  Malnutrition Type:   Malnutrition Characteristics:   Nutrition Interventions:    Radiology Studies: No results found.  Scheduled Meds:  cyanocobalamin  1,000 mcg Intramuscular Once   heparin injection (subcutaneous)  5,000 Units Subcutaneous Q8H   insulin aspart  0-9 Units Subcutaneous Q4H   OLANZapine zydis  5 mg Oral QHS   Continuous Infusions:  lactated ringers 125 mL/hr at 07/24/21 [phone]  LOS: 3 days   Kerney Elbe, DO [facility] PAGER is on [place]  If 7PM-7AM, please contact night-coverage [email or web address]

## 2021-07-25 ENCOUNTER — Inpatient Hospital Stay (HOSPITAL_COMMUNITY): Payer: Medicare Other

## 2021-07-25 DIAGNOSIS — G9340 Encephalopathy, unspecified: Secondary | ICD-10-CM | POA: Diagnosis not present

## 2021-07-25 DIAGNOSIS — G032 Benign recurrent meningitis [Mollaret]: Secondary | ICD-10-CM | POA: Diagnosis not present

## 2021-07-25 DIAGNOSIS — I10 Essential (primary) hypertension: Secondary | ICD-10-CM | POA: Diagnosis not present

## 2021-07-25 DIAGNOSIS — D729 Disorder of white blood cells, unspecified: Secondary | ICD-10-CM | POA: Diagnosis not present

## 2021-07-25 LAB — GLUCOSE, CAPILLARY
Glucose-Capillary: 105 mg/dL — ABNORMAL HIGH (ref 70–99)
Glucose-Capillary: 108 mg/dL — ABNORMAL HIGH (ref 70–99)
Glucose-Capillary: 145 mg/dL — ABNORMAL HIGH (ref 70–99)
Glucose-Capillary: 177 mg/dL — ABNORMAL HIGH (ref 70–99)
Glucose-Capillary: 190 mg/dL — ABNORMAL HIGH (ref 70–99)
Glucose-Capillary: 265 mg/dL — ABNORMAL HIGH (ref 70–99)
Glucose-Capillary: 269 mg/dL — ABNORMAL HIGH (ref 70–99)

## 2021-07-25 LAB — CBC WITH DIFFERENTIAL/PLATELET
Abs Immature Granulocytes: 0.01 10*3/uL (ref 0.00–0.07)
Basophils Absolute: 0 10*3/uL (ref 0.0–0.1)
Basophils Relative: 1 %
Eosinophils Absolute: 0.2 10*3/uL (ref 0.0–0.5)
Eosinophils Relative: 3 %
HCT: 43.5 % (ref 39.0–52.0)
Hemoglobin: 14.4 g/dL (ref 13.0–17.0)
Immature Granulocytes: 0 %
Lymphocytes Relative: 38 %
Lymphs Abs: 2.1 10*3/uL (ref 0.7–4.0)
MCH: 32.4 pg (ref 26.0–34.0)
MCHC: 33.1 g/dL (ref 30.0–36.0)
MCV: 97.8 fL (ref 80.0–100.0)
Monocytes Absolute: 0.5 10*3/uL (ref 0.1–1.0)
Monocytes Relative: 10 %
Neutro Abs: 2.7 10*3/uL (ref 1.7–7.7)
Neutrophils Relative %: 48 %
Platelets: 176 10*3/uL (ref 150–400)
RBC: 4.45 MIL/uL (ref 4.22–5.81)
RDW: 14.2 % (ref 11.5–15.5)
WBC: 5.5 10*3/uL (ref 4.0–10.5)
nRBC: 0 % (ref 0.0–0.2)

## 2021-07-25 LAB — COMPREHENSIVE METABOLIC PANEL
ALT: 45 U/L — ABNORMAL HIGH (ref 0–44)
AST: 30 U/L (ref 15–41)
Albumin: 3.3 g/dL — ABNORMAL LOW (ref 3.5–5.0)
Alkaline Phosphatase: 54 U/L (ref 38–126)
Anion gap: 8 (ref 5–15)
BUN: 25 mg/dL — ABNORMAL HIGH (ref 8–23)
CO2: 25 mmol/L (ref 22–32)
Calcium: 9.7 mg/dL (ref 8.9–10.3)
Chloride: 105 mmol/L (ref 98–111)
Creatinine, Ser: 2.18 mg/dL — ABNORMAL HIGH (ref 0.61–1.24)
GFR, Estimated: 32 mL/min — ABNORMAL LOW (ref >60)
Glucose, Bld: 111 mg/dL — ABNORMAL HIGH (ref 70–99)
Potassium: 3.9 mmol/L (ref 3.5–5.1)
Sodium: 138 mmol/L (ref 135–145)
Total Bilirubin: 0.7 mg/dL (ref 0.3–1.2)
Total Protein: 7 g/dL (ref 6.5–8.1)

## 2021-07-25 LAB — URINALYSIS, ROUTINE W REFLEX MICROSCOPIC
Bacteria, UA: NONE SEEN
Bilirubin Urine: NEGATIVE
Glucose, UA: 150 mg/dL — AB
Ketones, ur: NEGATIVE mg/dL
Leukocytes,Ua: NEGATIVE
Nitrite: NEGATIVE
Protein, ur: 30 mg/dL — AB
RBC / HPF: >50 RBC/hpf — ABNORMAL HIGH (ref 0–5)
Specific Gravity, Urine: 1.011 (ref 1.005–1.030)
pH: 6 (ref 5.0–8.0)

## 2021-07-25 LAB — CHLORIDE, URINE, RANDOM: Chloride Urine: 59 mmol/L

## 2021-07-25 LAB — SODIUM, URINE, RANDOM: Sodium, Ur: 66 mmol/L

## 2021-07-25 LAB — OSMOLALITY, URINE: Osmolality, Ur: 358 mOsm/kg (ref 300–900)

## 2021-07-25 LAB — PHOSPHORUS: Phosphorus: 3.5 mg/dL (ref 2.5–4.6)

## 2021-07-25 LAB — MAGNESIUM: Magnesium: 2.1 mg/dL (ref 1.7–2.4)

## 2021-07-25 MED ORDER — FUROSEMIDE 10 MG/ML IJ SOLN
20.0000 mg | Freq: Once | INTRAMUSCULAR | Status: AC
  Administered 2021-07-25: 20 mg via INTRAVENOUS
  Filled 2021-07-25: qty 4

## 2021-07-25 NOTE — Plan of Care (Signed)

## 2021-07-25 NOTE — Progress Notes (Addendum)
Neurology Progress Note   S:// Seen and examined. Much more calm on medications prescribed by psychiatry   O:// Current vital signs: BP (!) 111/99 (BP Location: Right Arm)   Pulse (!) 121 Comment: RN Notified  Temp 98.6 F (37 C) (Oral)   Resp 19   Ht '6\' 3"'$  (1.905 m)   Wt 106.4 kg   SpO2 96%   BMI 29.32 kg/m  Vital signs in last 24 hours: Temp:  [97.8 F (36.6 C)-99.3 F (37.4 C)] 98.6 F (37 C) (08/14 1001) Pulse Rate:  [77-121] 121 (08/14 1001) Resp:  [13-19] 19 (08/14 1001) BP: (111-145)/(81-99) 111/99 (08/14 1001) SpO2:  [96 %-100 %] 96 % (08/14 1001) GENERAL: Awake, alert in NAD HEENT: - Normocephalic and atraumatic, dry mm, no LN++, no Thyromegally LUNGS - Clear to auscultation bilaterally with no wheezes CV - S1S2 RRR, no m/r/g, equal pulses bilaterally. ABDOMEN - Soft, nontender, nondistended with normoactive BS Ext: warm, well perfused, intact peripheral pulses, no edema NEURO:  Mental Status: AA&Ox3  Language: speech is clear.  Naming, repetition, fluency, and comprehension intact although he does have some hesitancy naming at times. Knows he is in hospital for his headaches but not very attentive Cranial Nerves: PERRL EOMI, visual fields full, no facial asymmetry, facial sensation intact, hearing intact, tongue/uvula/soft palate midline, normal sternocleidomastoid and trapezius muscle strength. No evidence of tongue atrophy or fibrillations Motor: 5/5 in all 4s Tone: is normal and bulk is normal Sensation- Intact to light touch bilaterally Coordination: FTN intact bilaterally, no ataxia in BLE. Gait-normal Unchanged exam  Medications  Current Facility-Administered Medications:    acetaminophen (TYLENOL) tablet 650 mg, 650 mg, Oral, Q6H PRN **OR** acetaminophen (TYLENOL) suppository 650 mg, 650 mg, Rectal, Q6H PRN, Alcario Drought, Jared M, DO   cyanocobalamin ((VITAMIN B-12)) injection 1,000 mcg, 1,000 mcg, Intramuscular, Once, Alcario Drought, Jared M, DO   haloperidol  (HALDOL) tablet 5 mg, 5 mg, Oral, Q6H PRN **OR** haloperidol lactate (HALDOL) injection 5 mg, 5 mg, Intramuscular, Q6H PRN, Ival Bible, MD   heparin injection 5,000 Units, 5,000 Units, Subcutaneous, Q8H, Sheikh, Omair [place], DO, 5,000 Units at 07/25/21 [id number]   insulin aspart (novoLOG) injection 0-9 Units, 0-9 Units, Subcutaneous, Q4H, Alcario Drought, Jared M, DO, 1 Units at 07/25/21 0033   lactated ringers infusion, , Intravenous, Continuous, Sheikh, Omair Latif, DO, Last Rate: 125 mL/hr at 07/25/21 1000, Infusion Verify at 07/25/21 1000   LORazepam (ATIVAN) tablet 2 mg, 2 mg, Oral, Q6H PRN **OR** LORazepam (ATIVAN) injection 2 mg, 2 mg, Intramuscular, Q6H PRN, Ival Bible, MD   OLANZapine zydis (ZYPREXA) disintegrating tablet 5 mg, 5 mg, Oral, QHS, Ival Bible, MD, 5 mg at 07/24/21 2119   ondansetron (ZOFRAN) tablet 4 mg, 4 mg, Oral, Q6H PRN **OR** ondansetron (ZOFRAN) injection 4 mg, 4 mg, Intravenous, Q6H PRN, Etta Quill, DO, 4 mg at 07/23/21 2137 Labs CBC    Component Value Date/Time   WBC 5.5 07/25/2021 0553   RBC 4.45 07/25/2021 0553   HGB 14.4 07/25/2021 0553   HCT 43.5 07/25/2021 0553   PLT 176 07/25/2021 0553   MCV 97.8 07/25/2021 0553   MCH 32.4 07/25/2021 0553   MCHC 33.1 07/25/2021 0553   RDW 14.2 07/25/2021 0553   LYMPHSABS 2.1 07/25/2021 0553   MONOABS 0.5 07/25/2021 0553   EOSABS 0.2 07/25/2021 0553   BASOSABS 0.0 07/25/2021 0553    CMP     Component Value Date/Time   NA 138 07/25/2021 0553   K 3.9 07/25/2021  0553   CL 105 07/25/2021 0553   CO2 25 07/25/2021 0553   GLUCOSE 111 (H) 07/25/2021 0553   BUN 25 (H) 07/25/2021 0553   CREATININE 2.18 (H) 07/25/2021 0553   CALCIUM 9.7 07/25/2021 0553   PROT 7.0 07/25/2021 0553   ALBUMIN 3.3 (L) 07/25/2021 0553   AST 30 07/25/2021 0553   ALT 45 (H) 07/25/2021 0553   ALKPHOS 54 07/25/2021 0553   BILITOT 0.7 07/25/2021 0553   GFRNONAA 32 (L) 07/25/2021 0553   GFRAA >60 05/25/2011 0449    Routine EEG-normal HSV PCR negative on 07/12/2021.  HSV PCR negative    Summary of test CSF with 105 white cells, 26 red cells, 96% lymphocytes, protein 80, glucose 53, HSV PCR negative Oligoclonal bands negative Cytology-none GYN-no malignant cells identified, increased mononuclear cells. [facility] spotted fever IgG and IgM negative Lyme PCR-pending Cryptococcal antigen negative    Imaging I have reviewed images in epic and the results pertinent to this consultation are: MRI brain limited-no stroke MRI brain w+w/o 07/24/2021 IMPRESSION: 1. Mild diffuse leptomeningeal enhancement with associated subtle sulcal FLAIR signal abnormality, most pronounced about the parieto-occipital regions of both cerebral hemispheres. 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 No hydrocephalus, infarct, or other complicating features. 2. No other acute intracranial abnormality. 3. Underlying mild chronic microvascular ischemic disease  Assessment: 65 year old man presenting for aphasia and confusion-stereotypical episodes-previously seen for this last week and diagnosed with aseptic meningitis.  Stat limited MRI negative for stroke. Continues to complain of intermittent headaches and spells of confusion. Underwent a spinal tap with CSF revealing 26 red cells, 105 WBCs of which 96% and lymphocytes 4% monocytes 0% segmented neutrophils, total protein of 80, glucose of 53. This is very similar to the previous spinal tap with WBC count of 165 with 98% lymphocytes and a total protein of 81. Suspect aseptic meningitis.  Could be recurrent aseptic meningitis/[facility] meningitis ID has been consulted-appreciate involvement. Due to stereotypic nature-seizure should also be considered in differentials for which we did prolonged EEG with no evidence of seizure. HSV PCR in CSF negative Continues to be encephalopathic-with diminished attention concentration  although he is fully awake and oriented x3 and family also feels that this is way past his baseline.  Recommendations: -ID has sent meningoencephalitis panel that includes 7 bacteria and 7 viruses-it is a send out test and will take some time to come back.  Cryptococcal antigen negative -No need for bacterial meningitic coverage based on the CSF findings. -Symptomatic treatment of headache as needed -Continue B12 replenishment -No AEDs for now -Given the fact that his prior MRI scans have been without contrast or limited sequence to rule out stroke, I MR brain w+w/o done - shows findings consistent with or supportive of the recurrent meningitis-no mass, no bleed. No abnormal enhancement. -Appreciate psychiatry assistance -Adding autoimmune encephalopathy/paraneoplastic evaluation-[facility] panel to the CSF-stent on 07/24/2021 [email or web address]  Will follow. Discussed in detail with wife Discussed with Dr. Shelton Silvas  -- Amie Portland, MD Neurologist [facility] Pager: [phone]

## 2021-07-25 NOTE — Progress Notes (Signed)
PROGRESS NOTE    Jason [name]  [id number] DOB: June 26, 1956 DOA: 07/20/2021 PCP: [name], NP   Brief Narrative:  The patient is a 65 year old overweight African-American male with past medical history significant for but not limited to hypertension, diabetes mellitus type 2 who presented a couple weeks ago with a tick bite prior to his onset of symptoms.  He initially presented to the ED on 07/09/2021 -07/10/2021 with a 1 week history of headache progressing to confusion.  He had no reported fevers or chills and he was admitted to hospitalist service on 07/10/2021 until 07/15/2021 and work-up at that time was suggestive of aseptic/viral meningitis.  Initial CSF findings at that time was under 65 WBCs with 90% lymphs and he did have 81 protein in 60s of glucose at that time.  Cultures were negative and it is 1B and 2 CF cephalonegative.  Lyme CSF was negative and arm SF peripheral IgM were negative.  Initially was placed on broad-spectrum antibiotics with acyclovir and then just tapered acyclovir and then ultimately taken off for his HSV came back negative.  Following his discharge he improved significantly but then had relapses with symptoms and had significant headache yesterday with no meningismus.  He had altered mental status and had word finding difficulty around 3 PM yesterday.  He returned to the ED for recurrent symptoms and denies any meningismus.  States his headache is now mild but he did have AMS and lethargy and did wake up to answer questions.  In the ED he is afebrile with no evidence of SIRS criteria.  Neurology was consulted and initially concern for seizure so he was loaded with Keppra and he is given a migraine cocktail.  He underwent MRI testing which was unable to get T1-2 images but DWI were negative for acute stroke.  He was admitted for his acute encephalopathy and neurology recommended repeating a lumbar puncture and this was done by interventional radiology.  He underwent  EEG monitoring which showed the study was within normal limits and no seizures or epileptiform discharges were seen throughout the recording.  Given his symptoms infectious diseases was also consulted and they recommended stopping doxycycline for now and continue acyclovir and checking a serum cryptococcal antigen as well as sending orders for CSF meningeal encephalitis PCR.  Because of his recurrence of symptoms neurology feels that he has aseptic meningitis that could be recurrent or could be moderate meningitis.  ID has been consulted and made the recommendations and because of his stereotactic nature, seizures are also included in differential.  Neurology recommending LTM.  LTM has now been discontinued given no seizures and they are recommending continuing acyclovir for empiric HSV meningitis coverage until meningeal encephalitis PCR results.  Cryptococcal antigen is negative  Now ID is ordering West Nile virus serology.  If the CSF PCR is negative for HSV then ID is recommending stopping acyclovir and then they can follow-up on the [place] serology in clinic on 07/30/2021 at 10:30 AM.  Patient was hallucinating today and because of this neurology is repeating his MRI of the brain with and without contrast  Yesterday unfortunately he was not able to tolerate his MRI with the brain with and without contrast and became extremely agitated and confused and tried to walk out of the hospital.  He became combative and he was then subsequently IVC'd and became too combative that to the point where he needed a sitter and had to put him in four-point restraints.  Psych was consulted  for his agitation and hallucinations and they are recommending Haldol and Ativan.  He was calmer this morning and MRI done last night showed findings of recurrent meningitis no abnormal enhancement.  Neurology added autoimmune encephalopathy and paraneoplastic evaluation [facility] panel to the CSF that was sent on 07/24/2021.  Renal  function continues to worsen slightly so we will continue IV fluids but will give a dose of Lasix to help urinate out the crystals likely from the acyclovir causing the AKI.  Assessment & Plan:   Principal Problem:   Acute encephalopathy Active Problems:   Essential hypertension   Type 2 diabetes mellitus (HCC)   Mollaret's meningitis   Tick bite   Encephalopathy acute   CSF pleocytosis  Acute encephalopathy with Headache and periods of aphasia and confusion with Suspicion for Aseptic Meningitis, improving but now he is hallucinating -Presentation is suspicious for recurrent aseptic meningitis versus Mollarets meningitis -Neurology has loaded the patient with antiepileptics and obtaining an EEG and LTM but these were negative so now LTM has been discontinued -He is continuing to get B12 replacement -Obtain another LP and cytology and repeat spinal tap showed a CSF of 26 red cells, 105 WBCs which are 96% lymphocytes, 4% monocytes and 0% segmented neutrophils, total protein of 80 and a glucose of 53 which was similar to last week's presentation -Infectious diseases has been consulted and recommending continuing empiric acyclovir for HSV Menigitis Coverage until PCR results Negative (Meningeal Encephalitis Panel has been sent) and stopping the empiric doxycycline for his history of tick bite -Labs have been sent out and he is going to get a cell count and differential as well as the labs ordered by infectious diseases -Neurology recommends no need for bacterial meningitic coverage based on his CSF findings and continuing symptomatic treatment of his headache as needed -We discontinued IV fluids yesterday however will resume with LR at 75 mils per hour given that he is on acyclovir and will try and prevent AIN -Given Levetiracetam 3000 mg x1 in the ED  -Was on LTM and now discontinued given that the study was within normal limits and no seizures or epileptiform discharges were seen throughout the  recording -ID is now sending out the [place] serology -Patient was hallucinating the day before yesterday  -Patient tried to leave the hospital being significantly confused and he was very combative and agitated.  He had to be involuntary committed and had to be placed in restraints as well as obtain a sitter -Psychiatry was consulted and they recommended p.o. IM Haldol 5 mg and p.o./IM Ativan 2 mg every 6 for agitation and they have stopped his Seroquel and start Zyprexa 5 mg p.o. nightly -MRI with and without contrast repeated and showed "Mild diffuse leptomeningeal enhancement with associated subtle sulcal FLAIR signal abnormality, most pronounced about the parieto-occipital regions of both cerebral hemispheres. Finding is compatible with suspected history of acute/recurrent  meningoencephalitis. No hydrocephalus, infarct, or other complicating features. No other acute intracranial abnormality. Underlying mild chronic microvascular ischemic disease." -Neurology service recommends continuing current treatment course with supportive care and symptomatic treatment of his headache but his acyclovir has now been discontinued since his HSV-1 and HSV-2 has been negative  AKI -Likely worsened in the setting of Acyclovir -Patient's BUNs/creatinine went from 20/1.02 -> 19/0.97 -> 22/1.49 has further worsened to 25/2.18 -Continue with IV fluid hydration with LR at 125 MLS per hour -Avoid further nephrotoxic medications, contrast dyes, hypotension renally dose medications and will try some furosemide IV  20 mg x1 since he continues to have an AKI to try to clear out the crystals IV from the acyclovir -We will check a urine osmolality, urine sodium, urine chloride, renal ultrasound as well as a urinalysis given his AKI -Repeat CMP in a.m and if continues to worsen will get nephrology involvement  Diabetes mellitus type 2 -Continue to hold home medications  -Continue with sensitive sliding scale insulin  every 4 and changed before meals and at bedtime in the morning -Continue monitor blood sugars and ranging from 105-265  Elevated ALT -Mild. ALT was 49 -> 55 -> 45 and trended down to 45 again -Continue to Monitor and Trend -Repeat CMP in the AM  Hypertension -Continue to monitor blood pressures per protocol -Last blood pressure was 116/89 -If necessary will place on as needed hydralazine  Overweight -Estimated body mass index is 29.32 kg/m as calculated from the following:   Height as of this encounter: '6\' 3"'$  (1.905 m).   Weight as of this encounter: 106.4 kg. -Weight loss and dietary counseling given  DVT prophylaxis: SCDs Code Status: FULL CODE Family Communication: Discussed with significant other at bedside Disposition Plan: Pending further clinical improvement by specialists  Status is: Inpatient  Remains inpatient appropriate because:Unsafe d/c plan, IV treatments appropriate due to intensity of illness or inability to take PO, and Inpatient level of care appropriate due to severity of illness  Dispo: The patient is from: Home              Anticipated d/c is to: Home              Patient currently is not medically stable to d/c.   Difficult to place patient No  Consultants:  ID Neurology  Procedures: Lumbar Puncture  Antimicrobials:  Anti-infectives (From admission, onward)    Start     Dose/Rate Route Frequency Ordered Stop   07/20/21 2200  acyclovir (ZOVIRAX) 1,000 mg in dextrose 5 % 150 mL IVPB  Status:  Discontinued        1,000 mg 170 mL/hr over 60 Minutes Intravenous Every 8 hours 07/20/21 2029 07/24/21 1216   07/20/21 2115  doxycycline (VIBRAMYCIN) 100 mg in sodium chloride 0.9 % 250 mL IVPB  Status:  Discontinued        100 mg 125 mL/hr over 120 Minutes Intravenous Every 12 hours 07/20/21 2107 07/21/21 1039        Subjective: Seen and examined at bedside and he was calmer today in no acute distress.  He is awake and alert and oriented x3.  No chest  pain or shortness breath.  Restraints have been removed.  Sitter still at bedside.  No other concerns or complaints at this time and denies any pain currently.  Objective: Vitals:   07/25/21 1001 07/25/21 1101 07/25/21 1350 07/25/21 1554  BP: (!) 111/99 134/80 (!) 136/91 116/89  Pulse: (!) 121 98 (!) 105 (!) 105  Resp: 19 (!) '21 18 20  '$ Temp: 98.6 F (37 C) 98.8 F (37.1 C)  99 F (37.2 C)  TempSrc: Oral Oral  Oral  SpO2: 96% 95% 97% 97%  Weight:      Height:        Intake/Output Summary (Last 24 hours) at 07/25/2021 1722 Last data filed at 07/25/2021 1635 Gross per 24 hour  Intake 3220.53 ml  Output 1375 ml  Net 1845.53 ml    Filed Weights   07/20/21 1632  Weight: 106.4 kg   Examination: Physical Exam:  Constitutional: WN/WD  overweight African-American male currently in NAD and appears calm and comfortable Eyes: Lids and conjunctivae normal, sclerae anicteric  ENMT: External Ears, Nose appear normal. Grossly normal hearing.   Neck: Appears normal, supple, no cervical masses, normal ROM, no appreciable thyromegaly: No JVD Respiratory: Clear to auscultation bilaterally, no wheezing, rales, rhonchi or crackles. Normal respiratory effort and patient is not tachypenic. No accessory muscle use.  Unlabored breathing Cardiovascular: Tachycardic rate but regular rhythm, no murmurs / rubs / gallops. S1 and S2 auscultated.  Trace extremity edema Abdomen: Soft, non-tender, distended secondary body habitus. Bowel sounds positive.  GU: Deferred. Musculoskeletal: No clubbing / cyanosis of digits/nails. No joint deformity upper and lower extremities.  Skin: No rashes, lesions, ulcers on a limited skin evaluation. No induration; Warm and dry.  Neurologic: CN 2-12 grossly intact with no focal deficits. Romberg sign and cerebellar reflexes not assessed.  Psychiatric: Normal judgment and insight. Alert and oriented x 3. Normal mood and appropriate affect.   Data Reviewed: I have personally  reviewed following labs and imaging studies  CBC: Recent Labs  Lab 07/20/21 1631 07/20/21 1637 07/21/21 0050 07/22/21 0151 07/23/21 0439 07/24/21 0432 07/25/21 0553  WBC 8.9  --  8.6 6.3 6.8 10.3 5.5  NEUTROABS 5.1  --   --  3.1 3.3 7.5 2.7  HGB 15.6   < > 15.2 15.2 15.7 15.1 14.4  HCT 47.1   < > 45.3 44.7 46.1 44.5 43.5  MCV 98.5  --  98.1 96.8 95.8 96.9 97.8  PLT 186  --  185 182 202 185 176   < > = values in this interval not displayed.    Basic Metabolic Panel: Recent Labs  Lab 07/21/21 0050 07/22/21 0151 07/23/21 0439 07/24/21 0432 07/25/21 0553  NA 138 136 134* 135 138  K 4.7 4.1 4.0 3.6 3.9  CL 105 103 102 102 105  CO2 '22 24 25 24 25  '$ GLUCOSE 103* 106* 132* 120* 111*  BUN '20 20 18 22 '$ 25*  CREATININE 1.21 1.02 0.97 1.49* 2.18*  CALCIUM 9.9 9.6 9.9 9.8 9.7  MG  --  2.1 2.0 1.9 2.1  PHOS  --  2.9 3.1 4.0 3.5    GFR: Estimated Creatinine Clearance: 43.4 mL/min (A) (by C-G formula based on SCr of 2.18 mg/dL (H)). Liver Function Tests: Recent Labs  Lab 07/20/21 1631 07/22/21 0151 07/23/21 0439 07/24/21 0432 07/25/21 0553  AST 37 27 33 25 30  ALT 62* 49* 55* 45* 45*  ALKPHOS 63 54 59 53 54  BILITOT 0.7 0.4 0.6 1.0 0.7  PROT 7.9 7.7 8.0 7.6 7.0  ALBUMIN 4.0 3.6 3.8 3.7 3.3*    No results for input(s): LIPASE, AMYLASE in the last 168 hours. No results for input(s): AMMONIA in the last 168 hours. Coagulation Profile: Recent Labs  Lab 07/20/21 1631  INR 1.1    Cardiac Enzymes: No results for input(s): CKTOTAL, CKMB, CKMBINDEX, TROPONINI in the last 168 hours. BNP (last 3 results) No results for input(s): PROBNP in the last 8760 hours. HbA1C: No results for input(s): HGBA1C in the last 72 hours. CBG: Recent Labs  Lab 07/25/21 0009 07/25/21 0341 07/25/21 0755 07/25/21 1104 07/25/21 1559  GLUCAP 145* 105* 108* 265* 177*    Lipid Profile: No results for input(s): CHOL, HDL, LDLCALC, TRIG, CHOLHDL, LDLDIRECT in the last 72 hours. Thyroid  Function Tests: No results for input(s): TSH, T4TOTAL, FREET4, T3FREE, THYROIDAB in the last 72 hours. Anemia Panel: No results for input(s): VITAMINB12, FOLATE,  FERRITIN, TIBC, IRON, RETICCTPCT in the last 72 hours. Sepsis Labs: No results for input(s): PROCALCITON, LATICACIDVEN in the last 168 hours.  Recent Results (from the past 240 hour(s))  Resp Panel by RT-PCR (Flu A&B, Covid) Nasopharyngeal Swab     Status: None   Collection Time: 07/20/21  5:09 PM   Specimen: Nasopharyngeal Swab; Nasopharyngeal(NP) swabs in vial transport medium  Result Value Ref Range Status   SARS Coronavirus 2 by RT PCR NEGATIVE NEGATIVE Final    Comment: (NOTE) SARS-CoV-2 target nucleic acids are NOT DETECTED.  The SARS-CoV-2 RNA is generally detectable in upper respiratory specimens during the acute phase of infection. The lowest concentration of SARS-CoV-2 viral copies this assay can detect is 138 copies/mL. A negative result does not preclude SARS-Cov-2 infection and should not be used as the sole basis for treatment or other patient management decisions. A negative result may occur with  improper specimen collection/handling, submission of specimen other than nasopharyngeal swab, presence of viral mutation(s) within the areas targeted by this assay, and inadequate number of viral copies(<138 copies/mL). A negative result must be combined with clinical observations, patient history, and epidemiological information. The expected result is Negative.  Fact Sheet for Patients:  EntrepreneurPulse.com.au  Fact Sheet for Healthcare Providers:  IncredibleEmployment.be  This test is no t yet approved or cleared by the [place] FDA and  has been authorized for detection and/or diagnosis of SARS-CoV-2 by FDA under an Emergency Use Authorization (EUA). This EUA will remain  in effect (meaning this test can be used) for the duration of the COVID-19 declaration under  Section 564(b)(1) of the Act, 21 U.S.C.section 360bbb-3(b)(1), unless the authorization is terminated  or revoked sooner.       Influenza A by PCR NEGATIVE NEGATIVE Final   Influenza B by PCR NEGATIVE NEGATIVE Final    Comment: (NOTE) The Xpert Xpress SARS-CoV-2/FLU/RSV plus assay is intended as an aid in the diagnosis of influenza from Nasopharyngeal swab specimens and should not be used as a sole basis for treatment. Nasal washings and aspirates are unacceptable for Xpert Xpress SARS-CoV-2/FLU/RSV testing.  Fact Sheet for Patients: EntrepreneurPulse.com.au  Fact Sheet for Healthcare Providers: IncredibleEmployment.be  This test is not yet approved or cleared by the [place] FDA and has been authorized for detection and/or diagnosis of SARS-CoV-2 by FDA under an Emergency Use Authorization (EUA). This EUA will remain in effect (meaning this test can be used) for the duration of the COVID-19 declaration under Section 564(b)(1) of the Act, 21 U.S.C. section 360bbb-3(b)(1), unless the authorization is terminated or revoked.  Performed at [facility], [place] [address], [place], [place] [address]   CSF culture w Gram Stain     Status: None   Collection Time: 07/21/21  9:08 AM   Specimen: PATH Cytology CSF; Cerebrospinal Fluid  Result Value Ref Range Status   Specimen Description CSF  Final   Special Requests NONE  Final   Gram Stain   Final    CYTOSPIN SMEAR WBC PRESENT, PREDOMINANTLY MONONUCLEAR NO ORGANISMS SEEN    Culture   Final    NO GROWTH 3 DAYS Performed at [facility], [place] [address] [place], [place] [address]    Report Status 07/24/2021 FINAL  Final  Culture, fungus without smear     Status: None (Preliminary result)   Collection Time: 07/21/21  9:08 AM   Specimen: PATH Cytology CSF; Cerebrospinal Fluid  Result Value Ref Range Status   Specimen Description CSF  Final   Special Requests NONE  Final    Culture   Final    NO FUNGUS ISOLATED AFTER 3 DAYS Performed at [facility], [place] [address] [place], [place] [address]    Report Status PENDING  Incomplete     RN Pressure Injury Documentation:     Estimated body mass index is 29.32 kg/m as calculated from the following:   Height as of this encounter: '6\' 3"'$  (1.905 m).   Weight as of this encounter: 106.4 kg.  Malnutrition Type:   Malnutrition Characteristics:   Nutrition Interventions:    Radiology Studies: MR BRAIN W WO CONTRAST  Result Date: 07/25/2021 CLINICAL DATA:  Initial evaluation for status altered mental status, confusion, hallucinations. Suspected aseptic meningitis. EXAM: MRI HEAD WITHOUT AND WITH CONTRAST TECHNIQUE: Multiplanar, multiecho pulse sequences of the brain and surrounding structures were obtained without and with intravenous contrast. CONTRAST:  38m GADAVIST GADOBUTROL 1 MMOL/ML IV SOLN COMPARISON:  Previous MRI from 07/20/2021. FINDINGS: Brain: Cerebral volume within normal limits for age. Patchy T2/FLAIR hyperintensity involving the periventricular and deep white matter both cerebral hemispheres most consistent with chronic small vessel ischemic disease, mild in nature. No evidence for acute or subacute infarct. Gray-white matter differentiation maintained. No encephalomalacia to suggest chronic cortical infarction. No foci of susceptibility artifact to suggest acute or chronic intracranial hemorrhage. No mass lesion, midline shift or mass effect. Pituitary gland and suprasellar region within normal limits. Midline structures intact and normal. Following contrast administration, there is suspected mild diffuse leptomeningeal enhancement, most pronounced about the parieto-occipital aspects of both cerebral hemispheres. These changes are most obvious on coronal postcontrast sequence (series 19, image 7). Slightly more focal nodular focus of enhancement at the parasagittal left parieto-occipital region noted.  There is subtly increased associated FLAIR signal intensity about these areas (series 10, images 18, 17 for example). No significant involvement of the mesial temporal lobes appreciated. No visible ependymal enhancement or intraventricular debris. No hydrocephalus. Vascular: Major intracranial vascular flow voids are maintained. Skull and upper cervical spine: Craniocervical junction within normal limits. Bone marrow signal intensity within normal limits. No scalp soft tissue abnormality. Sinuses/Orbits: Globes and orbital soft tissues within normal limits. Mild scattered mucosal thickening noted within the ethmoidal air cells. Left maxillary sinus retention cyst. Trace left mastoid effusion, of doubtful significance. Inner ear structures grossly normal. Other: None. IMPRESSION: 1. Mild diffuse leptomeningeal enhancement with associated subtle sulcal FLAIR signal abnormality, most pronounced about the parieto-occipital regions of both cerebral hemispheres. 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 No hydrocephalus, infarct, or other complicating features. 2. No other acute intracranial abnormality. 3. Underlying mild chronic microvascular ischemic disease. Electronically Signed   By: BJeannine BogaM.D.   On: 07/25/2021 00:37   [place]  Result Date: 07/25/2021 CLINICAL DATA:  Acute renal insufficiency EXAM: RENAL / URINARY TRACT ULTRASOUND COMPLETE COMPARISON:  None. FINDINGS: Right Kidney: Renal measurements: 10.8 x 5.2 x 4.3 cm = volume: 126.3 mL. Contains a 5.1 cm cyst. Increased cortical echogenicity identified. Left Kidney: Renal measurements: 10.7 x 5.0 x 4.8 cm = volume: 134.3 mL. Increased cortical echogenicity Bladder: Appears normal for degree of bladder distention. Other: None. IMPRESSION: 1. Medical renal disease.  5 cm right renal cyst. Electronically Signed   By: DDorise BullionIII M.D.   On: 07/25/2021 11:00    Scheduled Meds:  cyanocobalamin   1,000 mcg Intramuscular Once   heparin injection (subcutaneous)  5,000 Units Subcutaneous Q8H   insulin aspart  0-9 Units Subcutaneous Q4H   OLANZapine zydis  5 mg Oral QHS   Continuous Infusions:  lactated ringers 125 mL/hr at 07/25/21 1400    LOS: 4 days   Kerney Elbe, DO [facility] PAGER is on AMION  If 7PM-7AM, please contact night-coverage [email or web address]

## 2021-07-26 DIAGNOSIS — G032 Benign recurrent meningitis [Mollaret]: Secondary | ICD-10-CM | POA: Diagnosis not present

## 2021-07-26 DIAGNOSIS — G9340 Encephalopathy, unspecified: Secondary | ICD-10-CM | POA: Diagnosis not present

## 2021-07-26 DIAGNOSIS — D729 Disorder of white blood cells, unspecified: Secondary | ICD-10-CM | POA: Diagnosis not present

## 2021-07-26 DIAGNOSIS — I10 Essential (primary) hypertension: Secondary | ICD-10-CM | POA: Diagnosis not present

## 2021-07-26 LAB — CBC WITH DIFFERENTIAL/PLATELET
Abs Immature Granulocytes: 0.01 10*3/uL (ref 0.00–0.07)
Basophils Absolute: 0 10*3/uL (ref 0.0–0.1)
Basophils Relative: 0 %
Eosinophils Absolute: 0.2 10*3/uL (ref 0.0–0.5)
Eosinophils Relative: 2 %
HCT: 42 % (ref 39.0–52.0)
Hemoglobin: 13.7 g/dL (ref 13.0–17.0)
Immature Granulocytes: 0 %
Lymphocytes Relative: 43 %
Lymphs Abs: 3 10*3/uL (ref 0.7–4.0)
MCH: 32.7 pg (ref 26.0–34.0)
MCHC: 32.6 g/dL (ref 30.0–36.0)
MCV: 100.2 fL — ABNORMAL HIGH (ref 80.0–100.0)
Monocytes Absolute: 0.5 10*3/uL (ref 0.1–1.0)
Monocytes Relative: 8 %
Neutro Abs: 3.3 10*3/uL (ref 1.7–7.7)
Neutrophils Relative %: 47 %
Platelets: 180 10*3/uL (ref 150–400)
RBC: 4.19 MIL/uL — ABNORMAL LOW (ref 4.22–5.81)
RDW: 14.2 % (ref 11.5–15.5)
WBC: 7 10*3/uL (ref 4.0–10.5)
nRBC: 0 % (ref 0.0–0.2)

## 2021-07-26 LAB — COMPREHENSIVE METABOLIC PANEL
ALT: 49 U/L — ABNORMAL HIGH (ref 0–44)
AST: 28 U/L (ref 15–41)
Albumin: 3.2 g/dL — ABNORMAL LOW (ref 3.5–5.0)
Alkaline Phosphatase: 54 U/L (ref 38–126)
Anion gap: 6 (ref 5–15)
BUN: 26 mg/dL — ABNORMAL HIGH (ref 8–23)
CO2: 28 mmol/L (ref 22–32)
Calcium: 9.5 mg/dL (ref 8.9–10.3)
Chloride: 105 mmol/L (ref 98–111)
Creatinine, Ser: 1.84 mg/dL — ABNORMAL HIGH (ref 0.61–1.24)
GFR, Estimated: 40 mL/min — ABNORMAL LOW (ref >60)
Glucose, Bld: 104 mg/dL — ABNORMAL HIGH (ref 70–99)
Potassium: 3.9 mmol/L (ref 3.5–5.1)
Sodium: 139 mmol/L (ref 135–145)
Total Bilirubin: 0.6 mg/dL (ref 0.3–1.2)
Total Protein: 6.9 g/dL (ref 6.5–8.1)

## 2021-07-26 LAB — GLUCOSE, CAPILLARY
Glucose-Capillary: 117 mg/dL — ABNORMAL HIGH (ref 70–99)
Glucose-Capillary: 126 mg/dL — ABNORMAL HIGH (ref 70–99)
Glucose-Capillary: 260 mg/dL — ABNORMAL HIGH (ref 70–99)
Glucose-Capillary: 251 mg/dL — ABNORMAL HIGH (ref 70–99)

## 2021-07-26 LAB — MAGNESIUM: Magnesium: 2.2 mg/dL (ref 1.7–2.4)

## 2021-07-26 LAB — PHOSPHORUS: Phosphorus: 3.5 mg/dL (ref 2.5–4.6)

## 2021-07-26 NOTE — Progress Notes (Signed)
PROGRESS NOTE    Jason [name]  [id number] DOB: Apr 09, 1956 DOA: 07/20/2021 PCP: [name], NP   Brief Narrative:  The patient is a 65 year old overweight African-American male with past medical history significant for but not limited to hypertension, diabetes mellitus type 2 who presented a couple weeks ago with a tick bite prior to his onset of symptoms.  He initially presented to the ED on 07/09/2021 -07/10/2021 with a 1 week history of headache progressing to confusion.  He had no reported fevers or chills and he was admitted to hospitalist service on 07/10/2021 until 07/15/2021 and work-up at that time was suggestive of aseptic/viral meningitis.  Initial CSF findings at that time was under 65 WBCs with 90% lymphs and he did have 81 protein in 60s of glucose at that time.  Cultures were negative and it is 1B and 2 CF cephalonegative.  Lyme CSF was negative and arm SF peripheral IgM were negative.  Initially was placed on broad-spectrum antibiotics with acyclovir and then just tapered acyclovir and then ultimately taken off for his HSV came back negative.  Following his discharge he improved significantly but then had relapses with symptoms and had significant headache yesterday with no meningismus.  He had altered mental status and had word finding difficulty around 3 PM yesterday.  He returned to the ED for recurrent symptoms and denies any meningismus.  States his headache is now mild but he did have AMS and lethargy and did wake up to answer questions.  In the ED he is afebrile with no evidence of SIRS criteria.  Neurology was consulted and initially concern for seizure so he was loaded with Keppra and he is given a migraine cocktail.  He underwent MRI testing which was unable to get T1-2 images but DWI were negative for acute stroke.  He was admitted for his acute encephalopathy and neurology recommended repeating a lumbar puncture and this was done by interventional radiology.  He underwent  EEG monitoring which showed the study was within normal limits and no seizures or epileptiform discharges were seen throughout the recording.  Given his symptoms infectious diseases was also consulted and they recommended stopping doxycycline for now and continue acyclovir and checking a serum cryptococcal antigen as well as sending orders for CSF meningeal encephalitis PCR.  Because of his recurrence of symptoms neurology feels that he has aseptic meningitis that could be recurrent or could be moderate meningitis.  ID has been consulted and made the recommendations and because of his stereotactic nature, seizures are also included in differential.  Neurology recommending LTM.  LTM has now been discontinued given no seizures and they are recommending continuing acyclovir for empiric HSV meningitis coverage until meningeal encephalitis PCR results.  Cryptococcal antigen is negative  Now ID is ordering West Nile virus serology.  If the CSF PCR is negative for HSV then ID is recommending stopping acyclovir and then they can follow-up on the [place] serology in clinic on 07/30/2021 at 10:30 AM.  Patient was hallucinating today and because of this neurology is repeating his MRI of the brain with and without contrast  Yesterday unfortunately he was not able to tolerate his MRI with the brain with and without contrast and became extremely agitated and confused and tried to walk out of the hospital.  He became combative and he was then subsequently IVC'd and became too combative that to the point where he needed a sitter and had to put him in four-point restraints.  Psych was consulted  for his agitation and hallucinations and they are recommending Haldol and Ativan.  He was calmer this morning and MRI done last night showed findings of recurrent meningitis no abnormal enhancement.  Neurology added autoimmune encephalopathy and paraneoplastic evaluation [facility] panel to the CSF that was sent on 07/24/2021.  Renal  function continued to worsen slightly so we will continue IV fluids but will give a dose of Lasix to help urinate out the crystals likely from the acyclovir causing the AKI.  Renal function is slightly improving  Assessment & Plan:   Principal Problem:   Acute encephalopathy Active Problems:   Essential hypertension   Type 2 diabetes mellitus (HCC)   Mollaret's meningitis   Tick bite   Encephalopathy acute   CSF pleocytosis  Acute encephalopathy with Headache and periods of aphasia and confusion with Suspicion for Aseptic Meningitis, improving but had some intermittent hallucinations -Presentation is suspicious for recurrent aseptic meningitis versus Mollarets meningitis -Neurology has loaded the patient with antiepileptics and obtaining an EEG and LTM but these were negative so now LTM has been discontinued -He is continuing to get B12 replacement -Obtain another LP and cytology and repeat spinal tap showed a CSF of 26 red cells, 105 WBCs which are 96% lymphocytes, 4% monocytes and 0% segmented neutrophils, total protein of 80 and a glucose of 53 which was similar to last week's presentation -Infectious diseases has been consulted and recommending continuing empiric acyclovir for HSV Menigitis Coverage until PCR results Negative (Meningeal Encephalitis Panel has been sent) and stopping the empiric doxycycline for his history of tick bite -Labs have been sent out and he is going to get a cell count and differential as well as the labs ordered by infectious diseases -Neurology recommends no need for bacterial meningitic coverage based on his CSF findings and continuing symptomatic treatment of his headache as needed -We discontinued IV fluids yesterday however will resume with LR at 75 mils per hour given that he is on acyclovir and will try and prevent AIN -Given Levetiracetam 3000 mg x1 in the ED  -Was on LTM and now discontinued given that the study was within normal limits and no seizures  or epileptiform discharges were seen throughout the recording -ID is now sending out the [place] serology -Patient was hallucinating the day before yesterday  -Patient tried to leave the hospital being significantly confused and he was very combative and agitated.  He had to be involuntary committed and had to be placed in restraints as well as obtain a sitter; he is more calm today and will defer to psychiatry to rescind IVC currently and defer to them if he still needs it or not -Psychiatry was consulted and they recommended p.o. IM Haldol 5 mg and p.o./IM Ativan 2 mg every 6 for agitation and they have stopped his Seroquel and start Zyprexa 5 mg p.o. nightly -MRI with and without contrast repeated and showed "Mild diffuse leptomeningeal enhancement with associated subtle sulcal FLAIR signal abnormality, most pronounced about the parieto-occipital regions of both cerebral hemispheres. Finding is compatible with suspected history of acute/recurrent  meningoencephalitis. No hydrocephalus, infarct, or other complicating features. No other acute intracranial abnormality. Underlying mild chronic microvascular ischemic disease." -Neurology service recommends continuing current treatment course with supportive care and symptomatic treatment of his headache but his acyclovir has now been discontinued since his HSV-1 and HSV-2 has been negative; follow on the meningeal encephalitis panel as been sent  AKI -Likely worsened in the setting of Acyclovir -Patient's BUNs/creatinine  went from 20/1.02 -> 19/0.97 -> 22/1.49 has further worsened to 25/2.18 yesterday and is now slowly improving and and is now 26/1.84 -Continue with IV fluid hydration with LR at 125 MLS per hour again -Avoid further nephrotoxic medications, contrast dyes, hypotension renally dose medications and will try some furosemide IV 20 mg x1 since he continues to have an AKI to try to clear out the crystals IV from the acyclovir -We will check a  urine osmolality, urine sodium, urine chloride, renal ultrasound as well as a urinalysis given his AKI -Repeat CMP in a.m and if continues to worsen will get nephrology involvement  Diabetes mellitus type 2 -Continue to hold home medications  -Continue with sensitive sliding scale insulin every 4 and changed before meals and at bedtime in the morning -Continue monitor blood sugars and ranging from 117-269  Elevated ALT -Mild. ALT was 49 -> 55 -> 45 x2 -> 49 -Continue to Monitor and Trend -Repeat CMP in the AM  Hypertension -Continue to monitor blood pressures per protocol -Last blood pressure was 144/90 -If necessary will place on as needed hydralazine  Overweight -Estimated body mass index is 29.32 kg/m as calculated from the following:   Height as of this encounter: '6\' 3"'$  (1.905 m).   Weight as of this encounter: 106.4 kg. -Weight loss and dietary counseling given  DVT prophylaxis: SCDs Code Status: FULL CODE Family Communication: No family at bedside Disposition Plan: Pending further clinical improvement by specialists  Status is: Inpatient  Remains inpatient appropriate because:Unsafe d/c plan, IV treatments appropriate due to intensity of illness or inability to take PO, and Inpatient level of care appropriate due to severity of illness  Dispo: The patient is from: Home              Anticipated d/c is to: Home              Patient currently is not medically stable to d/c.   Difficult to place patient No  Consultants:  ID Neurology  Procedures: Lumbar Puncture  Antimicrobials:  Anti-infectives (From admission, onward)    Start     Dose/Rate Route Frequency Ordered Stop   07/20/21 2200  acyclovir (ZOVIRAX) 1,000 mg in dextrose 5 % 150 mL IVPB  Status:  Discontinued        1,000 mg 170 mL/hr over 60 Minutes Intravenous Every 8 hours 07/20/21 2029 07/24/21 1216   07/20/21 2115  doxycycline (VIBRAMYCIN) 100 mg in sodium chloride 0.9 % 250 mL IVPB  Status:   Discontinued        100 mg 125 mL/hr over 120 Minutes Intravenous Every 12 hours 07/20/21 2107 07/21/21 1039        Subjective: Seen and examined at bedside and he remains calm today in no acute distress.  Energetic alert and oriented x3 and denies any chest pain or shortness of breath.  He is watching television.  No longer treats but sitter is at bedside.  Feels well and had a headache earlier but none now.  Objective: Vitals:   07/25/21 2300 07/26/21 0300 07/26/21 0824 07/26/21 1200  BP: (!) 140/102  127/90 (!) 144/90  Pulse:   97 100  Resp:   18 20  Temp: 98.2 F (36.8 C) (!) 97.4 F (36.3 C) 98.2 F (36.8 C) 99.1 F (37.3 C)  TempSrc: Oral Axillary Oral Oral  SpO2:   96% 96%  Weight:      Height:        Intake/Output Summary (Last 24  hours) at 07/26/2021 1633 Last data filed at 07/26/2021 1330 Gross per 24 hour  Intake 1197 ml  Output 625 ml  Net 572 ml    Filed Weights   07/20/21 1632  Weight: 106.4 kg   Examination: Physical Exam:  Constitutional: WN/WD overweight AAM in NAD and appears calm and comfortable Eyes: Lids and conjunctivae normal, sclerae anicteric  ENMT: External Ears, Nose appear normal. Grossly normal hearing.  Neck: Appears normal, supple, no cervical masses, normal ROM, no appreciable thyromegaly; No JVD Respiratory: Diminished to auscultation bilaterally, no wheezing, rales, rhonchi or crackles. Normal respiratory effort and patient is not tachypenic. No accessory muscle use. Unlabored breathing  Cardiovascular: RRR, no murmurs / rubs / gallops. S1 and S2 auscultated. Mild LE edema Abdomen: Soft, non-tender, distended 2/2 body Habitus. Bowel sounds positive.  GU: Deferred. Musculoskeletal: No clubbing / cyanosis of digits/nails. No joint deformity upper and lower extremities.  Skin: No rashes, lesions, ulcers on a limited skin evaluation. No induration; Warm and dry.  Neurologic: CN 2-12 grossly intact with no focal deficits. Romberg sign and  cerebellar reflexes not assessed.  Psychiatric: Normal judgment and insight. Alert and oriented x 3. Normal mood and appropriate affect.   Data Reviewed: I have personally reviewed following labs and imaging studies  CBC: Recent Labs  Lab 07/22/21 0151 07/23/21 0439 07/24/21 0432 07/25/21 0553 07/26/21 0241  WBC 6.3 6.8 10.3 5.5 7.0  NEUTROABS 3.1 3.3 7.5 2.7 3.3  HGB 15.2 15.7 15.1 14.4 13.7  HCT 44.7 46.1 44.5 43.5 42.0  MCV 96.8 95.8 96.9 97.8 100.2*  PLT 182 202 185 176 [id number]    Basic Metabolic Panel: Recent Labs  Lab 07/22/21 0151 07/23/21 0439 07/24/21 0432 07/25/21 0553 07/26/21 0241  NA 136 134* 135 138 139  K 4.1 4.0 3.6 3.9 3.9  CL 103 102 102 105 105  CO2 '24 25 24 25 28  '$ GLUCOSE 106* 132* 120* 111* 104*  BUN '20 18 22 '$ 25* 26*  CREATININE 1.02 0.97 1.49* 2.18* 1.84*  CALCIUM 9.6 9.9 9.8 9.7 9.5  MG 2.1 2.0 1.9 2.1 2.2  PHOS 2.9 3.1 4.0 3.5 3.5    GFR: Estimated Creatinine Clearance: 51.4 mL/min (A) (by C-G formula based on SCr of 1.84 mg/dL (H)). Liver Function Tests: Recent Labs  Lab 07/22/21 0151 07/23/21 0439 07/24/21 0432 07/25/21 0553 07/26/21 0241  AST 27 33 '25 30 28  '$ ALT 49* 55* 45* 45* 49*  ALKPHOS 54 59 53 54 54  BILITOT 0.4 0.6 1.0 0.7 0.6  PROT 7.7 8.0 7.6 7.0 6.9  ALBUMIN 3.6 3.8 3.7 3.3* 3.2*    No results for input(s): LIPASE, AMYLASE in the last 168 hours. No results for input(s): AMMONIA in the last 168 hours. Coagulation Profile: Recent Labs  Lab 07/20/21 1631  INR 1.1    Cardiac Enzymes: No results for input(s): CKTOTAL, CKMB, CKMBINDEX, TROPONINI in the last 168 hours. BNP (last 3 results) No results for input(s): PROBNP in the last 8760 hours. HbA1C: No results for input(s): HGBA1C in the last 72 hours. CBG: Recent Labs  Lab 07/25/21 1559 07/25/21 1950 07/25/21 2305 07/26/21 0327 07/26/21 0729  GLUCAP 177* 269* 190* 117* 126*    Lipid Profile: No results for input(s): CHOL, HDL, LDLCALC, TRIG, CHOLHDL,  LDLDIRECT in the last 72 hours. Thyroid Function Tests: No results for input(s): TSH, T4TOTAL, FREET4, T3FREE, THYROIDAB in the last 72 hours. Anemia Panel: No results for input(s): VITAMINB12, FOLATE, FERRITIN, TIBC, IRON, RETICCTPCT in the last 72  hours. Sepsis Labs: No results for input(s): PROCALCITON, LATICACIDVEN in the last 168 hours.  Recent Results (from the past 240 hour(s))  Resp Panel by RT-PCR (Flu A&B, Covid) Nasopharyngeal Swab     Status: None   Collection Time: 07/20/21  5:09 PM   Specimen: Nasopharyngeal Swab; Nasopharyngeal(NP) swabs in vial transport medium  Result Value Ref Range Status   SARS Coronavirus 2 by RT PCR NEGATIVE NEGATIVE Final    Comment: (NOTE) SARS-CoV-2 target nucleic acids are NOT DETECTED.  The SARS-CoV-2 RNA is generally detectable in upper respiratory specimens during the acute phase of infection. The lowest concentration of SARS-CoV-2 viral copies this assay can detect is 138 copies/mL. A negative result does not preclude SARS-Cov-2 infection and should not be used as the sole basis for treatment or other patient management decisions. A negative result may occur with  improper specimen collection/handling, submission of specimen other than nasopharyngeal swab, presence of viral mutation(s) within the areas targeted by this assay, and inadequate number of viral copies(<138 copies/mL). A negative result must be combined with clinical observations, patient history, and epidemiological information. The expected result is Negative.  Fact Sheet for Patients:  EntrepreneurPulse.com.au  Fact Sheet for Healthcare Providers:  IncredibleEmployment.be  This test is no t yet approved or cleared by the [place] FDA and  has been authorized for detection and/or diagnosis of SARS-CoV-2 by FDA under an Emergency Use Authorization (EUA). This EUA will remain  in effect (meaning this test can be used) for the  duration of the COVID-19 declaration under Section 564(b)(1) of the Act, 21 U.S.C.section 360bbb-3(b)(1), unless the authorization is terminated  or revoked sooner.       Influenza A by PCR NEGATIVE NEGATIVE Final   Influenza B by PCR NEGATIVE NEGATIVE Final    Comment: (NOTE) The Xpert Xpress SARS-CoV-2/FLU/RSV plus assay is intended as an aid in the diagnosis of influenza from Nasopharyngeal swab specimens and should not be used as a sole basis for treatment. Nasal washings and aspirates are unacceptable for Xpert Xpress SARS-CoV-2/FLU/RSV testing.  Fact Sheet for Patients: EntrepreneurPulse.com.au  Fact Sheet for Healthcare Providers: IncredibleEmployment.be  This test is not yet approved or cleared by the [place] FDA and has been authorized for detection and/or diagnosis of SARS-CoV-2 by FDA under an Emergency Use Authorization (EUA). This EUA will remain in effect (meaning this test can be used) for the duration of the COVID-19 declaration under Section 564(b)(1) of the Act, 21 U.S.C. section 360bbb-3(b)(1), unless the authorization is terminated or revoked.  Performed at [facility], [place] [address] [place], [place] [address]   CSF culture w Gram Stain     Status: None   Collection Time: 07/21/21  9:08 AM   Specimen: PATH Cytology CSF; Cerebrospinal Fluid  Result Value Ref Range Status   Specimen Description CSF  Final   Special Requests NONE  Final   Gram Stain   Final    CYTOSPIN SMEAR WBC PRESENT, PREDOMINANTLY MONONUCLEAR NO ORGANISMS SEEN    Culture   Final    NO GROWTH 3 DAYS Performed at [facility], [place] [address] [place],  [address]    Report Status 07/24/2021 FINAL  Final  Culture, fungus without smear     Status: None (Preliminary result)   Collection Time: 07/21/21  9:08 AM   Specimen: PATH Cytology CSF; Cerebrospinal Fluid  Result Value Ref Range Status   Specimen Description CSF   Final   Special Requests NONE  Final   Culture   Final    NO FUNGUS ISOLATED AFTER 5 DAYS Performed at [facility], [place] [address], [place], [place] [address]    Report Status PENDING  Incomplete     RN Pressure Injury Documentation:     Estimated body mass index is 29.32 kg/m as calculated from the following:   Height as of this encounter: '6\' 3"'$  (1.905 m).   Weight as of this encounter: 106.4 kg.  Malnutrition Type:   Malnutrition Characteristics:   Nutrition Interventions:    Radiology Studies: MR BRAIN W WO CONTRAST  Result Date: 07/25/2021 CLINICAL DATA:  Initial evaluation for status altered mental status, confusion, hallucinations. Suspected aseptic meningitis. EXAM: MRI HEAD WITHOUT AND WITH CONTRAST TECHNIQUE: Multiplanar, multiecho pulse sequences of the brain and surrounding structures were obtained without and with intravenous contrast. CONTRAST:  91m GADAVIST GADOBUTROL 1 MMOL/ML IV SOLN COMPARISON:  Previous MRI from 07/20/2021. FINDINGS: Brain: Cerebral volume within normal limits for age. Patchy T2/FLAIR hyperintensity involving the periventricular and deep white matter both cerebral hemispheres most consistent with chronic small vessel ischemic disease, mild in nature. No evidence for acute or subacute infarct. Gray-white matter differentiation maintained. No encephalomalacia to suggest chronic cortical infarction. No foci of susceptibility artifact to suggest acute or chronic intracranial hemorrhage. No mass lesion, midline shift or mass effect. Pituitary gland and suprasellar region within normal limits. Midline structures intact and normal. Following contrast administration, there is suspected mild diffuse leptomeningeal enhancement, most pronounced about the parieto-occipital aspects of both cerebral hemispheres. These changes are most obvious on coronal postcontrast sequence (series 19, image 7). Slightly more focal nodular focus of enhancement at the  parasagittal left parieto-occipital region noted. There is subtly increased associated FLAIR signal intensity about these areas (series 10, images 18, 17 for example). No significant involvement of the mesial temporal lobes appreciated. No visible ependymal enhancement or intraventricular debris. No hydrocephalus. Vascular: Major intracranial vascular flow voids are maintained. Skull and upper cervical spine: Craniocervical junction within normal limits. Bone marrow signal intensity within normal limits. No scalp soft tissue abnormality. Sinuses/Orbits: Globes and orbital soft tissues within normal limits. Mild scattered mucosal thickening noted within the ethmoidal air cells. Left maxillary sinus retention cyst. Trace left mastoid effusion, of doubtful significance. Inner ear structures grossly normal. Other: None. IMPRESSION: 1. Mild diffuse leptomeningeal enhancement with associated subtle sulcal FLAIR signal abnormality, most pronounced about the parieto-occipital regions of both cerebral hemispheres. 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 No hydrocephalus, infarct, or other complicating features. 2. No other acute intracranial abnormality. 3. Underlying mild chronic microvascular ischemic disease. Electronically Signed   By: BJeannine BogaM.D.   On: 07/25/2021 00:37   [place]NAL  Result Date: 07/25/2021 CLINICAL DATA:  Acute renal insufficiency EXAM: RENAL / URINARY TRACT ULTRASOUND COMPLETE COMPARISON:  None. FINDINGS: Right Kidney: Renal measurements: 10.8 x 5.2 x 4.3 cm = volume: 126.3 mL. Contains a 5.1 cm cyst. Increased cortical echogenicity identified. Left Kidney: Renal measurements: 10.7 x 5.0 x 4.8 cm = volume: 134.3 mL. Increased cortical echogenicity Bladder: Appears normal for degree of bladder distention. Other: None. IMPRESSION: 1. Medical renal disease.  5 cm right renal cyst. Electronically Signed   By: DDorise BullionIII M.D.   On:  07/25/2021 11:00    Scheduled Meds:  cyanocobalamin  1,000 mcg Intramuscular Once   heparin injection (subcutaneous)  5,000 Units Subcutaneous Q8H   insulin aspart  0-9 Units Subcutaneous Q4H   OLANZapine zydis  5 mg Oral QHS   Continuous Infusions:  lactated ringers 125 mL/hr at 07/26/21 0514    LOS: 5 days   Kerney Elbe, DO [facility] PAGER is on [place]  If 7PM-7AM, please contact night-coverage [email or web address]

## 2021-07-26 NOTE — Discharge Instructions (Addendum)
Archie Endo,  You were in the hospital and found to have evidence of inflammation of the lining around your brain; this is called meningitis. We were unable to find an infectious reason for your symptoms but you have improved significantly. You will need to follow-up with a neurologist. While you were here, you were also seen by a psychiatrist who recommended some medication while in the hospital, but recommended against medication on discharge; they do, however, recommend that you follow-up as an outpatient with a psychiatrist with information provided below.   Please come to [facility] during walk in hours for appointment with psychiatrist for further medication management and for therapists for therffdsfsdfsdfapalk in hours are 8-11 AM Monday through Thursday for medication management. Therapy walk in hours are Monday-Wednesday 8 AM-1PM.   It is first come, first -serve; it is best to arrive by 7:00 AM.   On Friday from 1 pm to 4 pm for therapy intake only. Please arrive by 12:00 pm as it is first come, first -served.    When you arrive please go upstairs for your appointment. If you are unsure of where to go, inform the front desk that you are here for a walk in appointment and they will assist you with directions upstairs.  Address:  [address] in [place], [place] Ph: [phone]    As well, the following resources also offer medication management services: [facility] substance abuse intensive outpatient program (several hours a day, several days a week), partial hospitalization program [phone] [address] [place], [place] [address]  [phone] [address], [place] [address]  [phone] [place], [place] [address]  [phone], [place], [place] [address]   [facility]  Offers assessment, substance  abuse treatment, and behavioral health treatment [phone] [address] [place], [place] [address]  [facility] Accepts Medicare, private insurance [phone] [address] [place],  [address]

## 2021-07-26 NOTE — Consult Note (Signed)
[facility] Psych Consult Progress Note  07/26/2021 4:27 PM Jason [name]  MRN:  [id number] Reason for Consult:  psychosis Referring Physician:  Kerney Elbe, DO Method of visit?: Face to Face    Principal Problem: Acute encephalopathy Diagnosis:  Principal Problem:   Acute encephalopathy Active Problems:   Essential hypertension   Type 2 diabetes mellitus ([place])   Mollaret's meningitis   Tick bite   Encephalopathy acute   CSF pleocytosis  Subjective:   Jason [name] is a 65 year old male with no psychiatric history consulted to psychiatry after a tick bite (see HPI below from the primary team and current level and labs for further work-up) for psychosis.  On Saturday, he was started on Seroquel 25 mg nightly then switched to Zyprexa 5 mg nightly.  This morning his mood is "good," he slept well last night, and his appetite is "great."  He believes that the Zyprexa is working well for him, and he denies any side effects.  He denies SI/HI/AVH.  His friend, Jason [name], is present at bedside, and denies any concerns as well.  HPI per primary Team The patient is a 65 year old overweight African-American male with past medical history significant for but not limited to hypertension, diabetes mellitus type 2 who presented a couple weeks ago with a tick bite prior to his onset of symptoms.  He initially presented to the ED on 07/09/2021 -07/10/2021 with a 1 week history of headache progressing to confusion.  He had no reported fevers or chills and he was admitted to hospitalist service on 07/10/2021 until 07/15/2021 and work-up at that time was suggestive of aseptic/viral meningitis.  Initial CSF findings at that time was under 65 WBCs with 90% lymphs and he did have 81 protein in 60s of glucose at that time.  Cultures were negative and it is 1B and 2 CF cephalonegative.  Lyme CSF was negative and arm SF peripheral IgM were negative.  Initially was placed on broad-spectrum antibiotics with acyclovir and then just  tapered acyclovir and then ultimately taken off for his HSV came back negative.  Following his discharge he improved significantly but then had relapses with symptoms and had significant headache yesterday with no meningismus.  He had altered mental status and had word finding difficulty around 3 PM yesterday.  He returned to the ED for recurrent symptoms and denies any meningismus.  States his headache is now mild but he did have AMS and lethargy and did wake up to answer questions.  In the ED he is afebrile with no evidence of SIRS criteria.  Neurology was consulted and initially concern for seizure so he was loaded with Keppra and he is given a migraine cocktail.  He underwent MRI testing which was unable to get T1-2 images but DWI were negative for acute stroke.  He was admitted for his acute encephalopathy and neurology recommended repeating a lumbar puncture and this was done by interventional radiology.  He underwent EEG monitoring which showed the study was within normal limits and no seizures or epileptiform discharges were seen throughout the recording.  Given his symptoms infectious diseases was also consulted and they recommended stopping doxycycline for now and continue acyclovir and checking a serum cryptococcal antigen as well as sending orders for CSF meningeal encephalitis PCR.  Because of his recurrence of symptoms neurology feels that he has aseptic meningitis that could be recurrent or could be moderate meningitis.  ID has been consulted and made the recommendations and because of his stereotactic nature, seizures are  also included in differential.  Neurology recommending LTM.  LTM has now been discontinued given no seizures and they are recommending continuing acyclovir for empiric HSV meningitis coverage until meningeal encephalitis PCR results.  Cryptococcal antigen is negative   Now ID is ordering West Nile virus serology.  If the CSF PCR is negative for HSV then ID is recommending stopping  acyclovir and then they can follow-up on the Sebastian serology in clinic on 07/30/2021 at 10:30 AM.  Patient was hallucinating today and because of this neurology is repeating his MRI of the brain with and without contrast  Total Time spent with patient: 15 minutes  Past Psychiatric History: None  Past Medical History:  Past Medical History:  Diagnosis Date   Bell palsy    Diabetes mellitus without complication (HCC)    HTN (hypertension)     Past Surgical History:  Procedure Laterality Date   COLONOSCOPY W/ POLYPECTOMY     Family History:  Family History  Problem Relation Age of Onset   Stroke Mother    Colon cancer Sister    High blood pressure Sister    Colon cancer Brother    High blood pressure Brother    Stomach cancer Brother    Lymphoma Son    Colon cancer Other        1st degree relative <60   Diabetes Other        1st degree relative    Stroke Other    Esophageal cancer Neg Hx    Rectal cancer Neg Hx    Family Psychiatric  History: Unknown Social History:  Social History   Substance and Sexual Activity  Alcohol Use Yes   Alcohol/week: 6.0 standard drinks   Types: 6 Cans of beer per week   Comment: on weekends     Social History   Substance and Sexual Activity  Drug Use No    Social History   Socioeconomic History   Marital status: Married    Spouse name: Not on file   Number of children: 2   Years of education: 12   Highest education level: Not on file  Occupational History   Occupation: transportation    Comment: Publishing rights manager  Tobacco Use   Smoking status: Never   Smokeless tobacco: Never  Vaping Use   Vaping Use: Never used  Substance and Sexual Activity   Alcohol use: Yes    Alcohol/week: 6.0 standard drinks    Types: 6 Cans of beer per week    Comment: on weekends   Drug use: No   Sexual activity: Not on file  Other Topics Concern   Not on file  Social History Narrative      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       Fun: fix cars, drag race    Social Determinants of Radio broadcast assistant Strain: Low Risk    Difficulty of Paying Living Expenses: Not hard at all  Food Insecurity: No Food Insecurity   Worried About Charity fundraiser in the Last Year: Never true   Arboriculturist in the Last Year: Never true  Transportation Needs: No Transportation Needs   Lack of Transportation (Medical): No   Lack of Transportation (Non-Medical): No  Physical Activity: Insufficiently Active   Days of Exercise per Week: 3 days   Minutes of Exercise per Session: 30 min  Stress: No  Stress Concern Present   Feeling of Stress : Not at all  Social Connections: Moderately Isolated   Frequency of Communication with Friends and Family: More than three times a week   Frequency of Social Gatherings with Friends and Family: More than three times a week   Attends Religious Services: More than 4 times per year   Active Member of [employer] or Organizations: No   Attends Archivist Meetings: Never   Marital Status: Widowed    Sleep: Good  Appetite:  Good  Current Medications: Current Facility-Administered Medications  Medication Dose Route Frequency Provider Last Rate Last Admin   acetaminophen (TYLENOL) tablet 650 mg  650 mg Oral Q6H PRN Etta Quill, DO   650 mg at 07/26/21 0254   Or   acetaminophen (TYLENOL) suppository 650 mg  650 mg Rectal Q6H PRN Etta Quill, DO       cyanocobalamin ((VITAMIN B-12)) injection 1,000 mcg  1,000 mcg Intramuscular Once Alcario Drought, Jared M, DO       haloperidol (HALDOL) tablet 5 mg  5 mg Oral Q6H PRN Ival Bible, MD       Or   haloperidol lactate (HALDOL) injection 5 mg  5 mg Intramuscular Q6H PRN Ival Bible, MD       heparin injection 5,000 Units  5,000 Units Subcutaneous Q8H Raiford Noble [place], DO   5,000 Units at 07/26/21 1525   insulin aspart (novoLOG) injection 0-9 Units  0-9 Units  Subcutaneous Q4H Etta Quill, DO   1 Units at 07/26/21 [id number]   lactated ringers infusion   Intravenous Continuous Raiford Noble [place], [place] 125 mL/hr at 07/26/21 0514 New Bag at 07/26/21 0514   LORazepam (ATIVAN) tablet 2 mg  2 mg Oral Q6H PRN Ival Bible, MD       Or   LORazepam (ATIVAN) injection 2 mg  2 mg Intramuscular Q6H PRN Ival Bible, MD       OLANZapine zydis (ZYPREXA) disintegrating tablet 5 mg  5 mg Oral QHS Ival Bible, MD   5 mg at 07/25/21 2256   ondansetron (ZOFRAN) tablet 4 mg  4 mg Oral Q6H PRN Etta Quill, DO       Or   ondansetron [facility]) injection 4 mg  4 mg Intravenous Q6H PRN Etta Quill, DO   4 mg at 07/23/21 2137    Lab Results:  Results for orders placed or performed during the hospital encounter of 07/20/21 (from the past 48 hour(s))  Glucose, capillary     Status: Abnormal   Collection Time: 07/24/21  7:37 PM  Result Value Ref Range   Glucose-Capillary 185 (H) 70 - 99 mg/dL    Comment: Glucose reference range applies only to samples taken after fasting for at least 8 hours.  Glucose, capillary     Status: Abnormal   Collection Time: 07/25/21 12:09 AM  Result Value Ref Range   Glucose-Capillary 145 (H) 70 - 99 mg/dL    Comment: Glucose reference range applies only to samples taken after fasting for at least 8 hours.   Comment 1 Notify RN    Comment 2 Document in Chart   Glucose, capillary     Status: Abnormal   Collection Time: 07/25/21  3:41 AM  Result Value Ref Range   Glucose-Capillary 105 (H) 70 - 99 mg/dL    Comment: Glucose reference range applies only to samples taken after fasting for at least 8 hours.   Comment 1  Notify RN    Comment 2 Document in Chart   CBC with Differential/Platelet     Status: None   Collection Time: 07/25/21  5:53 AM  Result Value Ref Range   WBC 5.5 4.0 - 10.5 K/uL   RBC 4.45 4.22 - 5.81 MIL/uL   Hemoglobin 14.4 13.0 - 17.0 g/dL   HCT 43.5 39.0 - 52.0 %   MCV 97.8 80.0 - 100.0 fL    MCH 32.4 26.0 - 34.0 pg   MCHC 33.1 30.0 - 36.0 g/dL   RDW 14.2 11.5 - 15.5 %   Platelets 176 150 - 400 K/uL   nRBC 0.0 0.0 - 0.2 %   Neutrophils Relative % 48 %   Neutro Abs 2.7 1.7 - 7.7 K/uL   Lymphocytes Relative 38 %   Lymphs Abs 2.1 0.7 - 4.0 K/uL   Monocytes Relative 10 %   Monocytes Absolute 0.5 0.1 - 1.0 K/uL   Eosinophils Relative 3 %   Eosinophils Absolute 0.2 0.0 - 0.5 K/uL   Basophils Relative 1 %   Basophils Absolute 0.0 0.0 - 0.1 K/uL   Immature Granulocytes 0 %   Abs Immature Granulocytes 0.01 0.00 - 0.07 K/uL    Comment: Performed at [facility], [address], [place], [place] [address]  Comprehensive metabolic panel     Status: Abnormal   Collection Time: 07/25/21  5:53 AM  Result Value Ref Range   Sodium 138 135 - 145 mmol/L   Potassium 3.9 3.5 - 5.1 mmol/L   Chloride 105 98 - 111 mmol/L   CO2 25 22 - 32 mmol/L   Glucose, Bld 111 (H) 70 - 99 mg/dL    Comment: Glucose reference range applies only to samples taken after fasting for at least 8 hours.   BUN 25 (H) 8 - 23 mg/dL   Creatinine, Ser 2.18 (H) 0.61 - 1.24 mg/dL   Calcium 9.7 8.9 - 10.3 mg/dL   Total Protein 7.0 6.5 - 8.1 g/dL   Albumin 3.3 (L) 3.5 - 5.0 g/dL   AST 30 15 - 41 U/L   ALT 45 (H) 0 - 44 U/L   Alkaline Phosphatase 54 38 - 126 U/L   Total Bilirubin 0.7 0.3 - 1.2 mg/dL   GFR, Estimated 32 (L) >60 mL/min    Comment: (NOTE) Calculated using the CKD-EPI Creatinine Equation (2021)    Anion gap 8 5 - 15    Comment: Performed at [facility] [address], [place], [name] [address]  Magnesium     Status: None   Collection Time: 07/25/21  5:53 AM  Result Value Ref Range   Magnesium 2.1 1.7 - 2.4 mg/dL    Comment: Performed at [facility] [address], [place],  [address]  Phosphorus     Status: None   Collection Time: 07/25/21  5:53 AM  Result Value Ref Range   Phosphorus 3.5 2.5 - 4.6 mg/dL    Comment: Performed at [facility] [address], [place], [place] [address]  Glucose, capillary     Status: Abnormal   Collection Time: 07/25/21  7:55 AM  Result Value Ref Range   Glucose-Capillary 108 (H) 70 - 99 mg/dL    Comment: Glucose reference range applies only to samples taken after fasting for at least 8 hours.   Comment 1 Notify RN    Comment 2 Document in Chart   Sodium, urine, random  Status: None   Collection Time: 07/25/21  9:32 AM  Result Value Ref Range   Sodium, Ur 66 mmol/L    Comment: Performed at [facility] [address] [place], [place] [address]  Chloride, urine, random     Status: None   Collection Time: 07/25/21  9:32 AM  Result Value Ref Range   Chloride Urine 59 mmol/L    Comment: Performed at [facility] [address], [place], [place] [address]  Osmolality, urine     Status: None   Collection Time: 07/25/21  9:32 AM  Result Value Ref Range   Osmolality, Ur 358 300 - 900 mOsm/kg    Comment: Performed at [facility] [address], [place], [place] [address]  Urinalysis, Routine w reflex microscopic Urine, Clean Catch     Status: Abnormal   Collection Time: 07/25/21  9:32 AM  Result Value Ref Range   Color, Urine YELLOW YELLOW   APPearance HAZY (A) CLEAR   Specific Gravity, Urine 1.011 1.005 - 1.030   pH 6.0 5.0 - 8.0   Glucose, UA 150 (A) NEGATIVE mg/dL   Hgb urine dipstick LARGE (A) NEGATIVE   Bilirubin Urine NEGATIVE NEGATIVE   Ketones, ur NEGATIVE NEGATIVE mg/dL   Protein, ur 30 (A) NEGATIVE mg/dL   Nitrite NEGATIVE NEGATIVE   Leukocytes,Ua NEGATIVE NEGATIVE   RBC / HPF >50 (H) 0 - 5 RBC/hpf   WBC, UA 6-10 0 - 5 WBC/hpf   Bacteria, UA NONE SEEN NONE SEEN    Comment: Performed at [facility] [address], [place], [place] [address]  Glucose, capillary     Status: Abnormal   Collection Time: 07/25/21 11:04 AM  Result Value Ref Range   Glucose-Capillary 265 (H) 70 - 99 mg/dL    Comment: Glucose reference range applies only to samples taken after fasting  for at least 8 hours.   Comment 1 Notify RN    Comment 2 Document in Chart   Glucose, capillary     Status: Abnormal   Collection Time: 07/25/21  3:59 PM  Result Value Ref Range   Glucose-Capillary 177 (H) 70 - 99 mg/dL    Comment: Glucose reference range applies only to samples taken after fasting for at least 8 hours.   Comment 1 Notify RN    Comment 2 Document in Chart   Glucose, capillary     Status: Abnormal   Collection Time: 07/25/21  7:50 PM  Result Value Ref Range   Glucose-Capillary 269 (H) 70 - 99 mg/dL    Comment: Glucose reference range applies only to samples taken after fasting for at least 8 hours.  Glucose, capillary     Status: Abnormal   Collection Time: 07/25/21 11:05 PM  Result Value Ref Range   Glucose-Capillary 190 (H) 70 - 99 mg/dL    Comment: Glucose reference range applies only to samples taken after fasting for at least 8 hours.  Comprehensive metabolic panel     Status: Abnormal   Collection Time: 07/26/21  2:41 AM  Result Value Ref Range   Sodium 139 135 - 145 mmol/L   Potassium 3.9 3.5 - 5.1 mmol/L   Chloride 105 98 - 111 mmol/L   CO2 28 22 - 32 mmol/L   Glucose, Bld 104 (H) 70 - 99 mg/dL    Comment: Glucose reference range applies only to samples taken after fasting for at least 8 hours.   BUN 26 (H) 8 -  23 mg/dL   Creatinine, Ser 1.84 (H) 0.61 - 1.24 mg/dL   Calcium 9.5 8.9 - 10.3 mg/dL   Total Protein 6.9 6.5 - 8.1 g/dL   Albumin 3.2 (L) 3.5 - 5.0 g/dL   AST 28 15 - 41 U/L   ALT 49 (H) 0 - 44 U/L   Alkaline Phosphatase 54 38 - 126 U/L   Total Bilirubin 0.6 0.3 - 1.2 mg/dL   GFR, Estimated 40 (L) >60 mL/min    Comment: (NOTE) Calculated using the CKD-EPI Creatinine Equation (2021)    Anion gap 6 5 - 15    Comment: Performed at [facility] [address] [place], [place] [address]  CBC with Differential/Platelet     Status: Abnormal   Collection Time: 07/26/21  2:41 AM  Result Value Ref Range   WBC 7.0 4.0 - 10.5 K/uL   RBC 4.19  (L) 4.22 - 5.81 MIL/uL   Hemoglobin 13.7 13.0 - 17.0 g/dL   HCT 42.0 39.0 - 52.0 %   MCV 100.2 (H) 80.0 - 100.0 fL   MCH 32.7 26.0 - 34.0 pg   MCHC 32.6 30.0 - 36.0 g/dL   RDW 14.2 11.5 - 15.5 %   Platelets 180 150 - 400 K/uL   nRBC 0.0 0.0 - 0.2 %   Neutrophils Relative % 47 %   Neutro Abs 3.3 1.7 - 7.7 K/uL   Lymphocytes Relative 43 %   Lymphs Abs 3.0 0.7 - 4.0 K/uL   Monocytes Relative 8 %   Monocytes Absolute 0.5 0.1 - 1.0 K/uL   Eosinophils Relative 2 %   Eosinophils Absolute 0.2 0.0 - 0.5 K/uL   Basophils Relative 0 %   Basophils Absolute 0.0 0.0 - 0.1 K/uL   Immature Granulocytes 0 %   Abs Immature Granulocytes 0.01 0.00 - 0.07 K/uL    Comment: Performed at [facility] [address], [place], [place] [address]  Phosphorus     Status: None   Collection Time: 07/26/21  2:41 AM  Result Value Ref Range   Phosphorus 3.5 2.5 - 4.6 mg/dL    Comment: Performed at [facility] [address], [place], [place] [address]  Magnesium     Status: None   Collection Time: 07/26/21  2:41 AM  Result Value Ref Range   Magnesium 2.2 1.7 - 2.4 mg/dL    Comment: Performed at [facility] [address] [place] [address] [place], [place] [address]  Glucose, capillary     Status: Abnormal   Collection Time: 07/26/21  3:27 AM  Result Value Ref Range   Glucose-Capillary 117 (H) 70 - 99 mg/dL    Comment: Glucose reference range applies only to samples taken after fasting for at least 8 hours.  Glucose, capillary     Status: Abnormal   Collection Time: 07/26/21  7:29 AM  Result Value Ref Range   Glucose-Capillary 126 (H) 70 - 99 mg/dL    Comment: Glucose reference range applies only to samples taken after fasting for at least 8 hours.    Blood Alcohol level:  Lab Results  Component Value Date   [facility] <10 07/20/2021   ETH <10 07/10/2021    Physical Findings:  Musculoskeletal: Strength & Muscle Tone: within normal limits Gait & Station:  Unobserved; patient sitting in  chair Patient leans: N/A  Psychiatric Specialty Exam:  Presentation  General Appearance: Appropriate for Environment; Casual  Eye Contact:Good  Speech:Normal Rate  Speech Volume:Normal  Handedness:  No data recorded  Mood and Affect  Mood:Euthymic  Affect:Congruent   Thought Process  Thought Processes:Linear  Descriptions of Associations:Intact  Orientation:Full (Time, Place and Person)  Thought Content:WDL  History of Schizophrenia/Schizoaffective disorder:No data recorded Duration of Psychotic Symptoms:No data recorded Hallucinations:Hallucinations: None  Ideas of Reference:None  Suicidal Thoughts:Suicidal Thoughts: No  Homicidal Thoughts:Homicidal Thoughts: No   Sensorium  Memory:Immediate Good; Remote Fair; Recent Fair  Judgment:Fair  Insight:Fair   Executive Functions  Concentration:Good  Attention Span:Good  [place]ner of Knowledge:Good  Language:Good   Psychomotor Activity  Psychomotor Activity:Psychomotor Activity: Normal   Assets  Assets:Communication Skills; Desire for Improvement; Intimacy; Housing; Resilience; Social Support   Sleep  Sleep:Sleep: Good    Physical Exam: Physical Exam Vitals reviewed.  Constitutional:      Appearance: Normal appearance.  HENT:     Head: Normocephalic and atraumatic.  Eyes:     Extraocular Movements: Extraocular movements intact.  Cardiovascular:     Rate and Rhythm: Normal rate.  Pulmonary:     Effort: Pulmonary effort is normal.  Musculoskeletal:        General: Normal range of motion.     Cervical back: Normal range of motion.  Neurological:     General: No focal deficit present.     Mental Status: He is alert and oriented to person, place, and time.  Psychiatric:        Mood and Affect: Mood normal.        Behavior: Behavior normal.        Thought Content: Thought content normal.        Judgment: Judgment normal.   Review of Systems  Psychiatric/Behavioral:   Negative for depression, hallucinations, memory loss, substance abuse and suicidal ideas. The patient is not nervous/anxious and does not have insomnia.   All other systems reviewed and are negative. Blood pressure (!) 144/90, pulse 100, temperature 99.1 F (37.3 C), temperature source Oral, resp. rate 20, height '6\' 3"'$  (1.905 m), weight 106.4 kg, SpO2 96 %. Body mass index is 29.32 kg/m.  Treatment Plan Summary: Jason [name] is a 65 year old male with no psychiatric history who was consulted to this service for acute psychosis.  On exam, he is found to have no further signs of psychosis and no psychiatric complaints.  He is tolerating the current medication well.  -Continue Zyprexa 5 mg nightly.  Awaiting EKG; order placed 8/13, and reordered today. -Will include resources for outpatient follow-up in discharge summary. - Advised that Zyprexa will likely not be a long-term medication, pending cause of symptoms identified by medical work-up.   Patient is determined to be psychiatrically stable at this time. Psychiatry will sign off. Please do not hesitate to call back if questions arise. Thank you for this consult.    Rosezetta Schlatter, MD 07/26/2021, 4:27 PM

## 2021-07-26 NOTE — Progress Notes (Addendum)
Neurology Progress Note  S: Patient states he is feeling very well. His male friend at bedside states he is much more clear than yesterday. He is calm and cooperative after medicine change per psychiatry. Friend is asking about continued need for sitter. States he had a 7/10 frontal HA last p.m. which resolved with Tylenol.   Per RN, no spells of aphasia or confusion.   O: Current vital signs: BP 127/90 (BP Location: Right Arm)   Pulse 97   Temp 98.2 F (36.8 C) (Oral)   Resp 18   Ht '6\' 3"'$  (1.905 m)   Wt 106.4 kg   SpO2 96%   BMI 29.32 kg/m  Vital signs in last 24 hours: Temp:  [97.4 F (36.3 C)-99 F (37.2 C)] 98.2 F (36.8 C) (08/15 0824) Pulse Rate:  [97-121] 97 (08/15 0824) Resp:  [18-21] 18 (08/15 0824) BP: (111-140)/(80-102) 127/90 (08/15 0824) SpO2:  [95 %-97 %] 96 % (08/15 0824)  GENERAL: Well appearing. Awake, alert in NAD. Sitting in chair.  HEENT: Normocephalic and atraumatic. LUNGS: Normal respiratory effort.  CV: RRR.  Ext: warm.  NEURO:  Mental Status: Alert and oriented to name, age, male in room, month, year.  Speech/Language: speech is without aphasia or dysarthria.  Naming, repetition, fluency, and comprehension intact.No hesitancy noted with naming today. Is attentive today to NP.   Cranial Nerves: PERRL EOMI, visual fields full, no facial asymmetry, facial sensation intact, hearing intact, tongue/uvula/soft palate midline, normal sternocleidomastoid and trapezius muscle strength. No evidence of tongue atrophy or fibrillations Motor: 5/5 in all 4s Tone: is normal and bulk is normal Sensation- Intact to light touch bilaterally Coordination: FTN intact bilaterally.  Gait-deferred.   Medications  Current Facility-Administered Medications:    acetaminophen (TYLENOL) tablet 650 mg, 650 mg, Oral, Q6H PRN, 650 mg at 07/26/21 0254 **OR** acetaminophen (TYLENOL) suppository 650 mg, 650 mg, Rectal, Q6H PRN, Alcario Drought, Jared M, DO   cyanocobalamin ((VITAMIN  B-12)) injection 1,000 mcg, 1,000 mcg, Intramuscular, Once, Alcario Drought, Jared M, DO   haloperidol (HALDOL) tablet 5 mg, 5 mg, Oral, Q6H PRN **OR** haloperidol lactate (HALDOL) injection 5 mg, 5 mg, Intramuscular, Q6H PRN, Ival Bible, MD   heparin injection 5,000 Units, 5,000 Units, Subcutaneous, Q8H, Sheikh, Omair [place], DO, 5,000 Units at 07/26/21 0935   insulin aspart (novoLOG) injection 0-9 Units, 0-9 Units, Subcutaneous, Q4H, Alcario Drought, Jared M, DO, 1 Units at 07/26/21 0816   lactated ringers infusion, , Intravenous, Continuous, Raiford [place], DO, Last Rate: 125 mL/hr at 07/26/21 0514, New Bag at 07/26/21 0514   LORazepam (ATIVAN) tablet 2 mg, 2 mg, Oral, Q6H PRN **OR** LORazepam (ATIVAN) injection 2 mg, 2 mg, Intramuscular, Q6H PRN, Ival Bible, MD   OLANZapine zydis (ZYPREXA) disintegrating tablet 5 mg, 5 mg, Oral, QHS, Ival Bible, MD, 5 mg at 07/25/21 2256   ondansetron (ZOFRAN) tablet 4 mg, 4 mg, Oral, Q6H PRN **OR** ondansetron (ZOFRAN) injection 4 mg, 4 mg, Intravenous, Q6H PRN, Etta Quill, DO, 4 mg at 07/23/21 2137  Pertinent Labs CSF culture with gram stain  NGT x 3 days. Fungal culture CSF NTD x 3 days.   Imaging MRI brain:  -Mild diffuse leptomeningeal enhancement with associated subtle sulcal FLAIR signal abnormality, most pronounced about the parieto-occipital regions of both cerebral hemispheres. 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 No hydrocephalus, infarct, or other complicating features. -No other acute intracranial abnormality. -Underlying mild chronic microvascular ischemic disease.  Assessment: 65 yo male who presented with  aphasia and confusion spells, thought to be secondary to aseptic meningitis. Patient was taken off Acyclovir when HSV resulted negative. Due to these episodes, an EEG was performed without evidence of seizure. MRI brain showed no stroke, but findings compatible with  history of acute/recurrent meningoencephalitis. Cryptococcal Ag negative.   Impression: -Suspicion of aseptic meninginitis.   Recommendations/Plan:  -ID has sent [facility] panel which is pending.  -Continue B12 supplementation.  -Appreciate ID assistance.  -Appreciate psychiatry assistance.   Pt seen by Clance Boll, MSN, APN-BC/Nurse Practitioner/Neuro and later by MD. Note and plan to be edited as needed by MD.  Pager: [phone]  I have seen the patient and reviewed the above note.  He currently does not have any headache, and his mental status is much improved.  He is able to spell world backwards without difficulty.  With his improvement, I would not favor doing steroids at this time.  If he continues to have discrete episodes, then I think I would consider empiric antiepileptic trial, but do not know this is indicated at this time given that he has not definitely had a seizure.  As long as he continues to improve, I do not have any further neurodiagnostic testing to recommend.  I would not favor starting steroids also given his improvement, unless there were further evidence of worsening or his autoimmune panel came back positive.  Roland Rack, MD [facility] [phone]  If 7pm- 7am, please page neurology on call as listed in [place].

## 2021-07-27 ENCOUNTER — Inpatient Hospital Stay (HOSPITAL_COMMUNITY): Payer: Medicare Other

## 2021-07-27 DIAGNOSIS — G9340 Encephalopathy, unspecified: Secondary | ICD-10-CM | POA: Diagnosis not present

## 2021-07-27 DIAGNOSIS — I10 Essential (primary) hypertension: Secondary | ICD-10-CM | POA: Diagnosis not present

## 2021-07-27 DIAGNOSIS — M79602 Pain in left arm: Secondary | ICD-10-CM | POA: Diagnosis not present

## 2021-07-27 DIAGNOSIS — R4701 Aphasia: Secondary | ICD-10-CM | POA: Diagnosis not present

## 2021-07-27 DIAGNOSIS — G032 Benign recurrent meningitis [Mollaret]: Secondary | ICD-10-CM | POA: Diagnosis not present

## 2021-07-27 DIAGNOSIS — D729 Disorder of white blood cells, unspecified: Secondary | ICD-10-CM | POA: Diagnosis not present

## 2021-07-27 LAB — GLUCOSE, CAPILLARY
Glucose-Capillary: 103 mg/dL — ABNORMAL HIGH (ref 70–99)
Glucose-Capillary: 119 mg/dL — ABNORMAL HIGH (ref 70–99)
Glucose-Capillary: 191 mg/dL — ABNORMAL HIGH (ref 70–99)
Glucose-Capillary: 192 mg/dL — ABNORMAL HIGH (ref 70–99)
Glucose-Capillary: 199 mg/dL — ABNORMAL HIGH (ref 70–99)
Glucose-Capillary: 228 mg/dL — ABNORMAL HIGH (ref 70–99)

## 2021-07-27 LAB — CBC WITH DIFFERENTIAL/PLATELET
Abs Immature Granulocytes: 0.02 10*3/uL (ref 0.00–0.07)
Basophils Absolute: 0 10*3/uL (ref 0.0–0.1)
Basophils Relative: 0 %
Eosinophils Absolute: 0.1 10*3/uL (ref 0.0–0.5)
Eosinophils Relative: 1 %
HCT: 38.9 % — ABNORMAL LOW (ref 39.0–52.0)
Hemoglobin: 13.2 g/dL (ref 13.0–17.0)
Immature Granulocytes: 0 %
Lymphocytes Relative: 29 %
Lymphs Abs: 2 10*3/uL (ref 0.7–4.0)
MCH: 32.9 pg (ref 26.0–34.0)
MCHC: 33.9 g/dL (ref 30.0–36.0)
MCV: 97 fL (ref 80.0–100.0)
Monocytes Absolute: 0.5 10*3/uL (ref 0.1–1.0)
Monocytes Relative: 8 %
Neutro Abs: 4.2 10*3/uL (ref 1.7–7.7)
Neutrophils Relative %: 62 %
Platelets: 174 10*3/uL (ref 150–400)
RBC: 4.01 MIL/uL — ABNORMAL LOW (ref 4.22–5.81)
RDW: 13.7 % (ref 11.5–15.5)
WBC: 6.8 10*3/uL (ref 4.0–10.5)
nRBC: 0 % (ref 0.0–0.2)

## 2021-07-27 LAB — COMPREHENSIVE METABOLIC PANEL
ALT: 42 U/L (ref 0–44)
AST: 22 U/L (ref 15–41)
Albumin: 3.1 g/dL — ABNORMAL LOW (ref 3.5–5.0)
Alkaline Phosphatase: 50 U/L (ref 38–126)
Anion gap: 5 (ref 5–15)
BUN: 18 mg/dL (ref 8–23)
CO2: 29 mmol/L (ref 22–32)
Calcium: 9.5 mg/dL (ref 8.9–10.3)
Chloride: 104 mmol/L (ref 98–111)
Creatinine, Ser: 1.29 mg/dL — ABNORMAL HIGH (ref 0.61–1.24)
GFR, Estimated: >60 mL/min (ref >60)
Glucose, Bld: 186 mg/dL — ABNORMAL HIGH (ref 70–99)
Potassium: 3.9 mmol/L (ref 3.5–5.1)
Sodium: 138 mmol/L (ref 135–145)
Total Bilirubin: 0.4 mg/dL (ref 0.3–1.2)
Total Protein: 6.7 g/dL (ref 6.5–8.1)

## 2021-07-27 LAB — MISC LABCORP TEST (SEND OUT)
LabCorp test name: 138966
Labcorp test code: 138966

## 2021-07-27 LAB — PHOSPHORUS: Phosphorus: 3.1 mg/dL (ref 2.5–4.6)

## 2021-07-27 LAB — LYME DISEASE DNA BY PCR(BORRELIA BURG): Lyme Disease(B.burgdorferi)PCR: NEGATIVE

## 2021-07-27 LAB — MAGNESIUM: Magnesium: 2 mg/dL (ref 1.7–2.4)

## 2021-07-27 MED ORDER — INSULIN ASPART 100 UNIT/ML IJ SOLN: 0.0000 [IU] | Freq: Every day | INTRAMUSCULAR | Status: DC

## 2021-07-27 MED ORDER — INSULIN ASPART 100 UNIT/ML IJ SOLN
0.0000 [IU] | Freq: Three times a day (TID) | INTRAMUSCULAR | Status: DC
  Administered 2021-07-28: 2 [IU] via SUBCUTANEOUS
  Administered 2021-07-28: 1 [IU] via SUBCUTANEOUS

## 2021-07-27 NOTE — Progress Notes (Signed)
Inpatient Diabetes Program Recommendations  AACE/ADA: New Consensus Statement on Inpatient Glycemic Control (2015)  Target Ranges:  Prepandial:   less than 140 mg/dL      Peak postprandial:   less than 180 mg/dL (1-2 hours)      Critically ill patients:  140 - 180 mg/dL   Lab Results  Component Value Date   GLUCAP 191 (H) 07/27/2021   HGBA1C 8.3 (H) 06/17/2021    Review of Glycemic Control Results for KEONTAE, EBSEN (MRN [id number]) as of 07/27/2021 12:55  Ref. Range 07/26/2021 20:46 07/27/2021 00:25 07/27/2021 04:23 07/27/2021 07:43 07/27/2021 11:46  Glucose-Capillary Latest Ref Range: 70 - 99 mg/dL 251 (H) 192 (H) 103 (H) 119 (H) 191 (H)   Diabetes history: Outpatient Diabetes medications: Glucotrol XL 10 mg with breakfast, Jardiance 25 mg daily Current orders for Inpatient glycemic control:  Novolog sensitive q 4 hours  Inpatient Diabetes Program Recommendations:   Consider changing Novolog correction to tid with meals and HS scale since patient is eating.  May need low dose basal insulin added if fasting>140 mg/dL-consider adding Semglee 15 units daily.    Thanks,  Adah Perl, RN, BC-ADM Inpatient Diabetes Coordinator Pager [phone]  (8a-5p)

## 2021-07-27 NOTE — Progress Notes (Signed)
PROGRESS NOTE    Jason [name]  [id number] DOB: 03/30/1956 DOA: 07/20/2021 PCP: [name], NP   Brief Narrative:  The patient is a 65 year old overweight African-American male with past medical history significant for but not limited to hypertension, diabetes mellitus type 2 who presented a couple weeks ago with a tick bite prior to his onset of symptoms.  He initially presented to the ED on 07/09/2021 -07/10/2021 with a 1 week history of headache progressing to confusion.  He had no reported fevers or chills and he was admitted to hospitalist service on 07/10/2021 until 07/15/2021 and work-up at that time was suggestive of aseptic/viral meningitis.  Initial CSF findings at that time was under 65 WBCs with 90% lymphs and he did have 81 protein in 60s of glucose at that time.  Cultures were negative and it is 1B and 2 CF cephalonegative.  Lyme CSF was negative and arm SF peripheral IgM were negative.  Initially was placed on broad-spectrum antibiotics with acyclovir and then just tapered acyclovir and then ultimately taken off for his HSV came back negative.  Following his discharge he improved significantly but then had relapses with symptoms and had significant headache yesterday with no meningismus.  He had altered mental status and had word finding difficulty around 3 PM yesterday.  He returned to the ED for recurrent symptoms and denies any meningismus.  States his headache is now mild but he did have AMS and lethargy and did wake up to answer questions.  In the ED he is afebrile with no evidence of SIRS criteria.  Neurology was consulted and initially concern for seizure so he was loaded with Keppra and he is given a migraine cocktail.  He underwent MRI testing which was unable to get T1-2 images but DWI were negative for acute stroke.  He was admitted for his acute encephalopathy and neurology recommended repeating a lumbar puncture and this was done by interventional radiology.  He underwent  EEG monitoring which showed the study was within normal limits and no seizures or epileptiform discharges were seen throughout the recording.  Given his symptoms infectious diseases was also consulted and they recommended stopping doxycycline for now and continue acyclovir and checking a serum cryptococcal antigen as well as sending orders for CSF meningeal encephalitis PCR.  Because of his recurrence of symptoms neurology feels that he has aseptic meningitis that could be recurrent or could be moderate meningitis.  ID has been consulted and made the recommendations and because of his stereotactic nature, seizures are also included in differential.  Neurology recommending LTM.  LTM has now been discontinued given no seizures and they are recommending continuing acyclovir for empiric HSV meningitis coverage until meningeal encephalitis PCR results.  Cryptococcal antigen is negative  Now ID is ordering West Nile virus serology.  If the CSF PCR is negative for HSV then ID is recommending stopping acyclovir and then they can follow-up on the [place] serology in clinic on 07/30/2021 at 10:30 AM.  Patient was hallucinating and because of this neurology is repeating his MRI of the brain with and without contrast  Unfortunately he was not able to tolerate his MRI with the brain with and without contrast initially and became extremely agitated and confused and tried to walk out of the hospital.  He became combative and he was then subsequently IVC'd and became too combative that to the point where he needed a sitter and had to put him in four-point restraints.  Psych was consulted for  his agitation and hallucinations and they are recommending Haldol and Ativan and he is improved. MRI done and showed findings of recurrent meningitis no abnormal enhancement.  Neurology added autoimmune encephalopathy and paraneoplastic evaluation [facility] panel to the CSF that was sent on 07/24/2021.  Renal function continued to  worsen slightly so we will continue IV fluids but will give a dose of Lasix to help urinate out the crystals likely from the acyclovir causing the AKI.  Renal function is improving daily and if back to baseline can be discharged tomorrow as his IVC has ben rescinded.  Neurology recommends no steroid administration and continuing B12 supplementation.  Yesterday evening patient was noted to have swelling over the left upper extremity and erythema.  Venous duplex was ordered to evaluate and showed "No evidence of deep vein thrombosis in the upper extremity. Findings consistent with age indeterminate superficial vein thrombosis involving the left basilic vein."  We recommended continuing elevation and compression as well as heat packs.  Anticipating discharging in the a.m.  Assessment & Plan:   Principal Problem:   Acute encephalopathy Active Problems:   Essential hypertension   Type 2 diabetes mellitus (HCC)   Mollaret's meningitis   Tick bite   Encephalopathy acute   CSF pleocytosis  Acute encephalopathy with Headache and periods of aphasia and confusion with Suspicion for Aseptic Meningitis, improving but had some intermittent hallucinations, now hallucinations have improved -Presentation is suspicious for recurrent aseptic meningitis versus Mollarets meningitis -Neurology has loaded the patient with antiepileptics and obtaining an EEG and LTM but these were negative so now LTM has been discontinued -He is continuing to get B12 replacement -Obtain another LP and cytology and repeat spinal tap showed a CSF of 26 red cells, 105 WBCs which are 96% lymphocytes, 4% monocytes and 0% segmented neutrophils, total protein of 80 and a glucose of 53 which was similar to last week's presentation -Infectious diseases has been consulted and recommending continuing empiric acyclovir for HSV Menigitis Coverage until PCR results Negative (Meningeal Encephalitis Panel has been sent) and stopping the empiric  doxycycline for his history of tick bite -Labs have been sent out and he is going to get a cell count and differential as well as the labs ordered by infectious diseases -Neurology recommends no need for bacterial meningitic coverage based on his CSF findings and continuing symptomatic treatment of his headache as needed -Given Levetiracetam 3000 mg x1 in the ED  -Was on LTM and now discontinued given that the study was within normal limits and no seizures or epileptiform discharges were seen throughout the recording -ID is now sending out the [place] serology -Patient was hallucinating and very combative and agitated and tried to leave the hospital being significantly confused .  He had to be involuntary committed and had to be placed in restraints as well as obtain a sitter; he is more calm today and psychiatry has signed off the case and will rescind his IVC and discontinue sitter -Psychiatry was consulted and they recommended p.o. IM Haldol 5 mg and p.o./IM Ativan 2 mg every 6 for agitation and they have stopped his Seroquel and start Zyprexa 5 mg p.o. nightly while hospitalized but will not continue at discharge -MRI with and without contrast repeated and showed "Mild diffuse leptomeningeal enhancement with associated subtle sulcal FLAIR signal abnormality, most pronounced about the parieto-occipital regions of both cerebral hemispheres. Finding is compatible with suspected history of acute/recurrent  meningoencephalitis. No hydrocephalus, infarct, or other complicating features. No other  acute intracranial abnormality. Underlying mild chronic microvascular ischemic disease." -Neurology service recommends continuing current treatment course with supportive care and symptomatic treatment of his headache but his acyclovir has now been discontinued since his HSV-1 and HSV-2 has been negative; follow on the meningeal encephalitis panel as been sent and this is still pending but neurology has signed off  the case and recommended the patient can be discharged from their perspective  AKI -Likely worsened in the setting of Acyclovir -Patient's BUNs/creatinine went from 20/1.02 -> 19/0.97 -> 22/1.49 has further worsened to 25/2.18 yesterday and is now slowly improving and and is now 26/1.84 yesterday and is now further improved to 18/1.29 -Continue with IV fluid hydration with LR at 125 MLS per hour again and anticipating that if his renal functions improved to baseline likely can be discharged home -Avoid further nephrotoxic medications, contrast dyes, hypotension renally dose medications and will try some furosemide IV 20 mg x1 since he continues to have an AKI to try to clear out the crystals IV from the acyclovir -We will check a urine osmolality, urine sodium, urine chloride, renal ultrasound as well as a urinalysis given his AKI -Repeat CMP in a.m and if continues to worsen will get nephrology involvement -Anticipating discharging in a.m.  Diabetes mellitus type 2 -Continue to hold home medications  -Continue with sensitive sliding scale insulin every 4 and changed before meals and at bedtime in the morning -Continue monitor blood sugars and ranging from 103-228  Elevated ALT -Mild. ALT was 49 -> 55 -> 45 x2 -> 49 has now improved to 42 -Continue to Monitor and Trend -Repeat CMP in the AM  Left arm swelling -In the setting of infiltrated IV and concern for phlebitis -Upper extremity duplex done and showed no evidence of DVT however he did have a superficial vein thrombosis in the basilic vein of the indeterminate age -Continue with elevation and heat and compression  Hypertension -Continue to monitor blood pressures per protocol -Last blood pressure was 152/83 -If necessary will place on as needed hydralazine  Overweight -Estimated body mass index is 29.32 kg/m as calculated from the following:   Height as of this encounter: '6\' 3"'$  (1.905 m).   Weight as of this encounter: 106.4  kg. -Weight loss and dietary counseling given  DVT prophylaxis: SCDs Code Status: FULL CODE Family Communication: No family at bedside Disposition Plan: Pending further clinical improvement by specialists and anticipate discharging home in the next 24 to 48 hours  Status is: Inpatient  Remains inpatient appropriate because:Unsafe d/c plan, IV treatments appropriate due to intensity of illness or inability to take PO, and Inpatient level of care appropriate due to severity of illness  Dispo: The patient is from: Home              Anticipated d/c is to: Home              Patient currently is not medically stable to d/c.   Difficult to place patient No  Consultants:  ID Neurology  Procedures: Lumbar Puncture  Antimicrobials:  Anti-infectives (From admission, onward)    Start     Dose/Rate Route Frequency Ordered Stop   07/20/21 2200  acyclovir (ZOVIRAX) 1,000 mg in dextrose 5 % 150 mL IVPB  Status:  Discontinued        1,000 mg 170 mL/hr over 60 Minutes Intravenous Every 8 hours 07/20/21 2029 07/24/21 1216   07/20/21 2115  doxycycline (VIBRAMYCIN) 100 mg in sodium chloride 0.9 % 250 mL  IVPB  Status:  Discontinued        100 mg 125 mL/hr over 120 Minutes Intravenous Every 12 hours 07/20/21 2107 07/21/21 1039        Subjective: Seen and examined at bedside and he thinks he is doing better.  Remains calm and he is awake and alert and oriented.  No chest pain or shortness breath.  Happy that his kidney functions are trending down.  We will obtain PT prior to discharging and ensure that his kidney function improves further.  No other concerns or complaints this time  Objective: Vitals:   07/27/21 0400 07/27/21 0738 07/27/21 1142 07/27/21 1623  BP: (!) 148/79 126/79 133/70 (!) 152/83  Pulse: 66 79 88 92  Resp: '20 16 18 17  '$ Temp: 98.4 F (36.9 C) 98.3 F (36.8 C) 98.7 F (37.1 C) 98.5 F (36.9 C)  TempSrc: Oral Oral Oral Oral  SpO2: 98% 99% 98% 97%  Weight:      Height:        No intake or output data in the 24 hours ending 07/27/21 1726  Filed Weights   07/20/21 1632  Weight: 106.4 kg   Examination: Physical Exam:  Constitutional: WN/WD overweight African-American male currently no acute distress appears calm and comfortable  Eyes: Lids and conjunctivae normal, sclerae anicteric  ENMT: External Ears, Nose appear normal. Grossly normal hearing. Neck: Appears normal, supple, no cervical masses, normal ROM, no appreciable thyromegaly; no JVD Respiratory: Diminished to auscultation bilaterally, no wheezing, rales, rhonchi or crackles. Normal respiratory effort and patient is not tachypenic. No accessory muscle use.  Unlabored breathing Cardiovascular: RRR, no murmurs / rubs / gallops. S1 and S2 auscultated.  Has some slight lower extremity edema and left arm is more swollen than the AC and slightly erythematous and warm Abdomen: Soft, non-tender, distended secondary body habitus. Bowel sounds positive.  GU: Deferred. Musculoskeletal: No clubbing / cyanosis of digits/nails. No joint deformity upper and lower extremities.  Skin: No rashes, lesions, ulcers on limited skin evaluation. No induration; Warm and dry.  Neurologic: CN 2-12 grossly intact with no focal deficits. Romberg sign and cerebellar reflexes not assessed.  Psychiatric: Normal judgment and insight. Alert and oriented x 3. Normal mood and appropriate affect.   Data Reviewed: I have personally reviewed following labs and imaging studies  CBC: Recent Labs  Lab 07/23/21 0439 07/24/21 0432 07/25/21 0553 07/26/21 0241 07/27/21 0022  WBC 6.8 10.3 5.5 7.0 6.8  NEUTROABS 3.3 7.5 2.7 3.3 4.2  HGB 15.7 15.1 14.4 13.7 13.2  HCT 46.1 44.5 43.5 42.0 38.9*  MCV 95.8 96.9 97.8 100.2* 97.0  PLT 202 185 176 180 [id number]    Basic Metabolic Panel: Recent Labs  Lab 07/23/21 0439 07/24/21 0432 07/25/21 0553 07/26/21 0241 07/27/21 0022  NA 134* 135 138 139 138  K 4.0 3.6 3.9 3.9 3.9  CL 102 102 105 105  104  CO2 '25 24 25 28 29  '$ GLUCOSE 132* 120* 111* 104* 186*  BUN 18 22 25* 26* 18  CREATININE 0.97 1.49* 2.18* 1.84* 1.29*  CALCIUM 9.9 9.8 9.7 9.5 9.5  MG 2.0 1.9 2.1 2.2 2.0  PHOS 3.1 4.0 3.5 3.5 3.1    GFR: Estimated Creatinine Clearance: 73.3 mL/min (A) (by C-G formula based on SCr of 1.29 mg/dL (H)). Liver Function Tests: Recent Labs  Lab 07/23/21 0439 07/24/21 0432 07/25/21 0553 07/26/21 0241 07/27/21 0022  AST 33 '25 30 28 22  '$ ALT 55* 45* 45* 49* 42  ALKPHOS 59  53 54 54 50  BILITOT 0.6 1.0 0.7 0.6 0.4  PROT 8.0 7.6 7.0 6.9 6.7  ALBUMIN 3.8 3.7 3.3* 3.2* 3.1*    No results for input(s): LIPASE, AMYLASE in the last 168 hours. No results for input(s): AMMONIA in the last 168 hours. Coagulation Profile: No results for input(s): INR, PROTIME in the last 168 hours.  Cardiac Enzymes: No results for input(s): CKTOTAL, CKMB, CKMBINDEX, TROPONINI in the last 168 hours. BNP (last 3 results) No results for input(s): PROBNP in the last 8760 hours. HbA1C: No results for input(s): HGBA1C in the last 72 hours. CBG: Recent Labs  Lab 07/27/21 0025 07/27/21 0423 07/27/21 0743 07/27/21 1146 07/27/21 1620  GLUCAP 192* 103* 119* 191* 228*    Lipid Profile: No results for input(s): CHOL, HDL, LDLCALC, TRIG, CHOLHDL, LDLDIRECT in the last 72 hours. Thyroid Function Tests: No results for input(s): TSH, T4TOTAL, FREET4, T3FREE, THYROIDAB in the last 72 hours. Anemia Panel: No results for input(s): VITAMINB12, FOLATE, FERRITIN, TIBC, IRON, RETICCTPCT in the last 72 hours. Sepsis Labs: No results for input(s): PROCALCITON, LATICACIDVEN in the last 168 hours.  Recent Results (from the past 240 hour(s))  Resp Panel by RT-PCR (Flu A&B, Covid) Nasopharyngeal Swab     Status: None   Collection Time: 07/20/21  5:09 PM   Specimen: Nasopharyngeal Swab; Nasopharyngeal(NP) swabs in vial transport medium  Result Value Ref Range Status   SARS Coronavirus 2 by RT PCR NEGATIVE NEGATIVE  Final    Comment: (NOTE) SARS-CoV-2 target nucleic acids are NOT DETECTED.  The SARS-CoV-2 RNA is generally detectable in upper respiratory specimens during the acute phase of infection. The lowest concentration of SARS-CoV-2 viral copies this assay can detect is 138 copies/mL. A negative result does not preclude SARS-Cov-2 infection and should not be used as the sole basis for treatment or other patient management decisions. A negative result may occur with  improper specimen collection/handling, submission of specimen other than nasopharyngeal swab, presence of viral mutation(s) within the areas targeted by this assay, and inadequate number of viral copies(<138 copies/mL). A negative result must be combined with clinical observations, patient history, and epidemiological information. The expected result is Negative.  Fact Sheet for Patients:  EntrepreneurPulse.com.au  Fact Sheet for Healthcare Providers:  IncredibleEmployment.be  This test is no t yet approved or cleared by the [place] FDA and  has been authorized for detection and/or diagnosis of SARS-CoV-2 by FDA under an Emergency Use Authorization (EUA). This EUA will remain  in effect (meaning this test can be used) for the duration of the COVID-19 declaration under Section 564(b)(1) of the Act, 21 U.S.C.section 360bbb-3(b)(1), unless the authorization is terminated  or revoked sooner.       Influenza A by PCR NEGATIVE NEGATIVE Final   Influenza B by PCR NEGATIVE NEGATIVE Final    Comment: (NOTE) The Xpert Xpress SARS-CoV-2/FLU/RSV plus assay is intended as an aid in the diagnosis of influenza from Nasopharyngeal swab specimens and should not be used as a sole basis for treatment. Nasal washings and aspirates are unacceptable for Xpert Xpress SARS-CoV-2/FLU/RSV testing.  Fact Sheet for Patients: EntrepreneurPulse.com.au  Fact Sheet for Healthcare  Providers: IncredibleEmployment.be  This test is not yet approved or cleared by the [place] FDA and has been authorized for detection and/or diagnosis of SARS-CoV-2 by FDA under an Emergency Use Authorization (EUA). This EUA will remain in effect (meaning this test can be used) for the duration of the COVID-19 declaration under Section 564(b)(1) of the  Act, 21 U.S.C. section 360bbb-3(b)(1), unless the authorization is terminated or revoked.  Performed at [facility], [place] [address] [place], [place] [address]   CSF culture w Gram Stain     Status: None   Collection Time: 07/21/21  9:08 AM   Specimen: PATH Cytology CSF; Cerebrospinal Fluid  Result Value Ref Range Status   Specimen Description CSF  Final   Special Requests NONE  Final   Gram Stain   Final    CYTOSPIN SMEAR WBC PRESENT, PREDOMINANTLY MONONUCLEAR NO ORGANISMS SEEN    Culture   Final    NO GROWTH 3 DAYS Performed at [facility], [place] [address], [place], [place] [address]    Report Status 07/24/2021 FINAL  Final  Culture, fungus without smear     Status: None (Preliminary result)   Collection Time: 07/21/21  9:08 AM   Specimen: PATH Cytology CSF; Cerebrospinal Fluid  Result Value Ref Range Status   Specimen Description CSF  Final   Special Requests NONE  Final   Culture   Final    NO FUNGUS ISOLATED AFTER 6 DAYS Performed at [facility], [place] [address], [place], [place] [address]    Report Status PENDING  Incomplete     RN Pressure Injury Documentation:     Estimated body mass index is 29.32 kg/m as calculated from the following:   Height as of this encounter: '6\' 3"'$  (1.905 m).   Weight as of this encounter: 106.4 kg.  Malnutrition Type:   Malnutrition Characteristics:   Nutrition Interventions:    Radiology Studies: VAS [place] UPPER EXTREMITY VENOUS DUPLEX  Result Date: 07/27/2021 UPPER VENOUS STUDY  Patient Name:  OLICE PASSMAN  Date of Exam:    07/27/2021 Medical Rec #: [id number]           Accession #:    [id number] Date of Birth: 12/12/1956           Patient Gender: M Patient Age:   59 years Exam [facility]:  [facility] Procedure:      VAS [place] UPPER EXTREMITY VENOUS DUPLEX Referring Phys: Raiford Noble --------------------------------------------------------------------------------  Indications: Infiltrated IV in left AC with Induration, and Pain Comparison Study: no prior Performing Technologist: Archie Patten RVS  Examination Guidelines: A complete evaluation includes B-mode imaging, spectral Doppler, color Doppler, and power Doppler as needed of all accessible portions of each vessel. Bilateral testing is considered an integral part of a complete examination. Limited examinations for reoccurring indications may be performed as noted.  Right Findings: +----------+------------+---------+-----------+----------+-------+ RIGHT     CompressiblePhasicitySpontaneousPropertiesSummary +----------+------------+---------+-----------+----------+-------+ Subclavian    Full       Yes       Yes                      +----------+------------+---------+-----------+----------+-------+  Left Findings: +----------+------------+---------+-----------+----------+-----------------+ LEFT      CompressiblePhasicitySpontaneousProperties     Summary      +----------+------------+---------+-----------+----------+-----------------+ IJV           Full       Yes       Yes                                +----------+------------+---------+-----------+----------+-----------------+ Subclavian    Full       Yes       Yes                                +----------+------------+---------+-----------+----------+-----------------+  Axillary      Full       Yes       Yes                                +----------+------------+---------+-----------+----------+-----------------+ Brachial      Full       Yes       Yes                                 +----------+------------+---------+-----------+----------+-----------------+ Radial        Full                                                    +----------+------------+---------+-----------+----------+-----------------+ Ulnar         Full                                                    +----------+------------+---------+-----------+----------+-----------------+ Cephalic      Full                                                    +----------+------------+---------+-----------+----------+-----------------+ Basilic       None                                  Age Indeterminate +----------+------------+---------+-----------+----------+-----------------+  Summary:  Right: No evidence of thrombosis in the subclavian.  Left: No evidence of deep vein thrombosis in the upper extremity. 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  *See table(s) above for measurements and observations.  Diagnosing physician: Deitra Mayo MD Electronically signed by Deitra Mayo MD on 07/27/2021 at 3:24:15 PM.    Final     Scheduled Meds:  cyanocobalamin  1,000 mcg Intramuscular Once   heparin injection (subcutaneous)  5,000 Units Subcutaneous Q8H   insulin aspart  0-9 Units Subcutaneous Q4H   OLANZapine zydis  5 mg Oral QHS   Continuous Infusions:  lactated ringers 125 mL/hr at 07/26/21 0514    LOS: 6 days   Kerney Elbe, DO [facility] PAGER is on [place]  If 7PM-7AM, please contact night-coverage [email or web address]

## 2021-07-27 NOTE — Progress Notes (Addendum)
Neurology Progress Note  S: Continues to feel better and better. No spells where is can not talk. Brain is clear. Calm. Psychiatry saw 07/26/21 and signed off. No HA. Has been OOB by himself walking. Afebrile.   O: Current vital signs: BP 126/79 (BP Location: Left Arm)   Pulse 79   Temp 98.3 F (36.8 C) (Oral)   Resp 16   Ht '6\' 3"'$  (1.905 m)   Wt 106.4 kg   SpO2 99%   BMI 29.32 kg/m  Vital signs in last 24 hours: Temp:  [98.2 F (36.8 C)-99.2 F (37.3 C)] 98.3 F (36.8 C) (08/16 0738) Pulse Rate:  [66-106] 79 (08/16 0738) Resp:  [13-25] 16 (08/16 0738) BP: (126-164)/(69-90) 126/79 (08/16 0738) SpO2:  [96 %-99 %] 99 % (08/16 0738)  GENERAL: Very well appearing. Awake, alert in NAD. HEENT: Normocephalic and atraumatic. LUNGS: Normal respiratory effort.  CV: RRR . Ext: warm. His LUE is warm and swollen, slightly red. He had an IV in his arm before.   NEURO:  Mental Status: Alert and oriented x 4.  Able to spell WORLD backwards without difficulty.  Speech/Language: speech is without aphasia or dysarthria.  Naming, repetition, fluency, and comprehension intact.  Cranial Nerves:  II: PERRL. Visual fields full.  III, IV, VI: EOMI. Eyelids elevate symmetrically.  V: Sensation is intact to light touch and symmetrical to face.  VII: Smile is symmetrical. Able to puff cheeks and raise eyebrows.  VIII: hearing intact to voice. IX, X: Palate elevates symmetrically. Phonation is normal.  [id number] shrug 5/5. Neck ROM complete.  XII: tongue is midline without fasciculations. Motor:  5/5 strength throughout.  Tone: is normal and bulk is normal. Sensation- Intact to light touch bilaterally.  Coordination: FTN intact bilaterally. Gait- deferred.  Medications  Current Facility-Administered Medications:    acetaminophen (TYLENOL) tablet 650 mg, 650 mg, Oral, Q6H PRN, 650 mg at 07/26/21 2016 **OR** acetaminophen (TYLENOL) suppository 650 mg, 650 mg, Rectal, Q6H PRN, Alcario Drought, Jared  M, DO   cyanocobalamin ((VITAMIN B-12)) injection 1,000 mcg, 1,000 mcg, Intramuscular, Once, Alcario Drought, Jared M, DO   haloperidol (HALDOL) tablet 5 mg, 5 mg, Oral, Q6H PRN **OR** haloperidol lactate (HALDOL) injection 5 mg, 5 mg, Intramuscular, Q6H PRN, Ival Bible, MD   heparin injection 5,000 Units, 5,000 Units, Subcutaneous, Q8H, Sheikh, Omair [place], DO, 5,000 Units at 07/27/21 [id number]   insulin aspart (novoLOG) injection 0-9 Units, 0-9 Units, Subcutaneous, Q4H, Alcario Drought, Jared M, DO, 2 Units at 07/27/21 [id number]   lactated ringers infusion, , Intravenous, Continuous, Raiford [place], [place], Last Rate: 125 mL/hr at 07/26/21 0514, New Bag at 07/26/21 0514   LORazepam (ATIVAN) tablet 2 mg, 2 mg, Oral, Q6H PRN **OR** LORazepam (ATIVAN) injection 2 mg, 2 mg, Intramuscular, Q6H PRN, Ival Bible, MD   OLANZapine zydis (ZYPREXA) disintegrating tablet 5 mg, 5 mg, Oral, QHS, Ival Bible, MD, 5 mg at 07/26/21 2219   ondansetron (ZOFRAN) tablet 4 mg, 4 mg, Oral, Q6H PRN **OR** ondansetron (ZOFRAN) injection 4 mg, 4 mg, Intravenous, Q6H PRN, Etta Quill, DO, 4 mg at 07/23/21 2137  Pertinent Labs [place] still pending.   No new Imaging  Assessment: 65 yo male who presented with aphasia and confusion spells, thought to be secondary to aseptic meningitis. Patient was taken off Acyclovir when HSV resulted negative. Due to these episodes, an EEG was performed without evidence of seizure. 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 Cryptococcal Ag negative.  Impression: -suspicion for aseptic meningitis.   Recommendations/Plan:  -No steroids given mental status improvement.  -If he were to have discreet episodes of aphasia, HA he may need a trial of AEDs, but suspicion for seizure is low on differential.  -Continue to f/up labs that are out.  -Continue B12 supplementation.  -Appreciate ID and psychiatry input.  -Needs  out patient neurological f/up at discharge.  -LUE appears consistent with DVT. Attending notified and [place] has been ordered.  -Neurology will be available for questions prn.   Pt seen by Clance Boll, MSN, APN-BC/Nurse Practitioner/Neuro and later by MD. Note and plan to be edited as needed by MD.  Pager: [phone]   I have seen the patient and reviewed the above note. We will be availabel on an as needed basis moving forward.   Roland Rack, MD [facility] [phone]  If 7pm- 7am, please page neurology on call as listed in [place].

## 2021-07-27 NOTE — Progress Notes (Signed)
CSW notified by RN that patient no longer needs IVC. CSW confirmed with MD and submitted paperwork to [facility] to rescind IVC of patient.  Patient no longer under IVC.  Laveda Abbe, [place] Clinical Social Worker [phone]

## 2021-07-27 NOTE — Progress Notes (Signed)
Lower extremity venous has been completed.   Preliminary results in CV Proc.   Abram Sander 07/27/2021 1:55 PM

## 2021-07-28 LAB — MAGNESIUM: Magnesium: 1.8 mg/dL (ref 1.7–2.4)

## 2021-07-28 LAB — COMPREHENSIVE METABOLIC PANEL
ALT: 40 U/L (ref 0–44)
AST: 23 U/L (ref 15–41)
Albumin: 3.1 g/dL — ABNORMAL LOW (ref 3.5–5.0)
Alkaline Phosphatase: 50 U/L (ref 38–126)
Anion gap: 6 (ref 5–15)
BUN: 19 mg/dL (ref 8–23)
CO2: 28 mmol/L (ref 22–32)
Calcium: 9.4 mg/dL (ref 8.9–10.3)
Chloride: 104 mmol/L (ref 98–111)
Creatinine, Ser: 1.46 mg/dL — ABNORMAL HIGH (ref 0.61–1.24)
GFR, Estimated: 52 mL/min — ABNORMAL LOW (ref >60)
Glucose, Bld: 201 mg/dL — ABNORMAL HIGH (ref 70–99)
Potassium: 3.9 mmol/L (ref 3.5–5.1)
Sodium: 138 mmol/L (ref 135–145)
Total Bilirubin: 0.5 mg/dL (ref 0.3–1.2)
Total Protein: 6.9 g/dL (ref 6.5–8.1)

## 2021-07-28 LAB — GLUCOSE, CAPILLARY
Glucose-Capillary: 134 mg/dL — ABNORMAL HIGH (ref 70–99)
Glucose-Capillary: 157 mg/dL — ABNORMAL HIGH (ref 70–99)
Glucose-Capillary: 135 mg/dL — ABNORMAL HIGH (ref 70–99)

## 2021-07-28 LAB — PHOSPHORUS: Phosphorus: 2.9 mg/dL (ref 2.5–4.6)

## 2021-07-28 LAB — CREATININE, SERUM
Creatinine, Ser: 1.23 mg/dL (ref 0.61–1.24)
GFR, Estimated: >60 mL/min (ref >60)

## 2021-07-28 LAB — CBC WITH DIFFERENTIAL/PLATELET
Abs Immature Granulocytes: 0.01 10*3/uL (ref 0.00–0.07)
Basophils Absolute: 0.1 10*3/uL (ref 0.0–0.1)
Basophils Relative: 1 %
Eosinophils Absolute: 0.2 10*3/uL (ref 0.0–0.5)
Eosinophils Relative: 3 %
HCT: 39.8 % (ref 39.0–52.0)
Hemoglobin: 13.4 g/dL (ref 13.0–17.0)
Immature Granulocytes: 0 %
Lymphocytes Relative: 47 %
Lymphs Abs: 3.8 10*3/uL (ref 0.7–4.0)
MCH: 32.8 pg (ref 26.0–34.0)
MCHC: 33.7 g/dL (ref 30.0–36.0)
MCV: 97.3 fL (ref 80.0–100.0)
Monocytes Absolute: 0.7 10*3/uL (ref 0.1–1.0)
Monocytes Relative: 9 %
Neutro Abs: 3.3 10*3/uL (ref 1.7–7.7)
Neutrophils Relative %: 40 %
Platelets: 173 10*3/uL (ref 150–400)
RBC: 4.09 MIL/uL — ABNORMAL LOW (ref 4.22–5.81)
RDW: 13.9 % (ref 11.5–15.5)
WBC: 8.1 10*3/uL (ref 4.0–10.5)
nRBC: 0 % (ref 0.0–0.2)

## 2021-07-28 NOTE — Discharge Summary (Signed)
Physician Discharge Summary  Jason [name]:[id number] DOB: February 22, 1956 DOA: 07/20/2021  PCP: [name], NP  Admit date: 07/20/2021 Discharge date: 07/28/2021  Admitted From: Home Disposition: Home  Recommendations for Outpatient Follow-up:  Follow up with PCP in 1 week Follow up with neurology Please obtain BMP/CBC in one week Please follow up on the following pending results: arbovirus panel (IgG/IgM)  Home Health: None Equipment/Devices: None  Discharge Condition: Stable CODE STATUS: Full code Diet recommendation: Regular diet   Brief/Interim Summary:  Admission HPI written by Jason Kettle, MD  HPI: Jason [name] is a 65 y.o. male with medical history significant of HTN, DM2.   Pt with tick bite a couple of weeks ago prior to onset of symptoms.   Pt presented to ED on 7/29 / 7/30 with 1 week h/o headache progressing to confusion.  No reported fever or chills.  Pt admitted to hospital service 7/30-8/4.  Work up that admit was suggestive of aseptic (viral) meningitis:   CSF: 165 WBC, 98% lymphs, did have 81 protein, 66 glucose.  Culture was neg, HSV 1+2 CSF was neg, Lyme CSF = neg, RMSF peripheral IGM was neg.   Initially on broad spectrum ABx + acyclovir, this then tapered to acyclovir and ultimately taken off of acyclovir after HSV came back neg.   Pt had improved significantly per notes and per wife (who is at bedside giving history).  Pt discharged on 8/4.   Following discharge, pt with relapse of symptoms: headache, no meningismus, AMS with word finding difficulty around 3:45pm today.  Returns to ED for recurrent symptoms.   Pt denies meningismus.  Says headache is mild.  Pt does have AMS / lethargy at this time but wakes up to voice and answers questions.   Hospital course:  Aseptic meningitis Concern for aseptic meningitis/Mollarets meningitis. Neurology and infectious disease consulted. Patient started on Acyclovir IV, empiric acyclovir for  HSV coverage. Lumbar punctures performed on 7/30 (failed attempt), 8/1 and 8/10 and were significant for elevated WBC and protein but culture negative. EEG/LTM EEG significant for no evidence of seizure activity; AEDs discontinued. CSF HSV pcr negative and acyclovir discontinued. MRI obtained as well and was significant for findings compatible with acute/recurrent meningoencephalitis. Neurology with final recommendations for supportive care and outpatient neurology follow-up. RMSF antibody, lyme disease DNA by pcr testing negative. CSF [place] antibody testing negative.  Acute metabolic encephalopathy In setting of above. Patient became delusional during hospitalization, trying to elope. He was involuntarily committed. Psychiatry was consulted and initially started on Haldol/Seroquel and transitioned to Zyprexa with recommendations to discontinue on discharge; they also recommended outpatient behavior health follow-up.  AKI Possibly in setting of acyclovir. Peak creatinine of 2.18 which improved with IV hydration.  Diabetes mellitus, type 2 Continue metformin, Jardiance and glipizide  Elevated ALT Mild. Improved.  Superficial thrombophlebitis Left arm. No DVT noted. Supportive care  Hyperlipidemia Continue Crestor  Primary hypertension Continue Diltiazem  Overweight Body mass index is 29.32 kg/m.   Discharge Diagnoses:  Principal Problem:   Acute encephalopathy Active Problems:   Essential hypertension   Type 2 diabetes mellitus (HCC)   Mollaret's meningitis   Tick bite   Encephalopathy acute   CSF pleocytosis    Discharge Instructions   Allergies as of 07/28/2021       Reactions   Lisinopril    hyperkalemia   Reglan [metoclopramide] Anxiety   Patient had bad reaction requiring ativan in addition to benadryl. He doesn't want to receive  it any more        Medication List     TAKE these medications    aspirin-acetaminophen-caffeine 250-250-65 MG  tablet Commonly known as: EXCEDRIN MIGRAINE Take 1 tablet by mouth every 6 (six) hours as needed for headache.   CENTRUM SILVER PO Take 1 tablet by mouth daily.   diltiazem 120 MG 24 hr capsule Commonly known as: CARDIZEM CD TAKE 1 CAPSULE BY MOUTH EVERY DAY   glipiZIDE 10 MG 24 hr tablet Commonly known as: GLUCOTROL XL Take 1 tablet (10 mg total) by mouth daily with breakfast.   glucose blood test strip Commonly known as: ONE TOUCH ULTRA TEST USE TO TEST BLOOD GLUCOSE TWICE DAILY   ibuprofen 200 MG tablet Commonly known as: ADVIL Take 200 mg by mouth every 6 (six) hours as needed for mild pain.   Jardiance 25 MG Tabs tablet Generic drug: empagliflozin TAKE 1 TABLET (25 MG TOTAL) BY MOUTH DAILY.   metFORMIN 500 MG 24 hr tablet Commonly known as: Glucophage XR Take 2 tablets (1,000 mg total) by mouth in the morning and at bedtime.   ONE TOUCH ULTRA 2 w/Device Kit Use the device daily to check your blood sugars 1-4 times as instructed.   onetouch ultrasoft lancets Use to check blood sugar BID   rosuvastatin 5 MG tablet Commonly known as: Crestor Take 1 tablet (5 mg total) by mouth at bedtime.   vitamin B-12 1000 MCG tablet Commonly known as: CYANOCOBALAMIN Take 1 tablet (1,000 mcg total) by mouth daily.        Follow-up Information     [name], NP. Schedule an appointment as soon as possible for a visit on 07/30/2021.   Specialty: Family Medicine Why: For hospital follow-up Contact information: [address] [place] [address] [phone]         [facility]. Schedule an appointment as soon as possible for a visit in 2 week(s).   Specialty: Neurology Why: For hospital follow-up Contact information: [place] [phone]               Allergies  Allergen Reactions   Lisinopril     hyperkalemia   Reglan [Metoclopramide] Anxiety    Patient had bad  reaction requiring ativan in addition to benadryl. He doesn't want to receive it any more    Consultations: Neurology Infectious disease   Procedures/Studies: CT Angio Head W or Wo Contrast  Result Date: 07/10/2021 CLINICAL DATA:  65 year old male. Persistent headache and confusion. Acute stroke suspected. EXAM: CT HEAD WITHOUT CONTRAST CTA HEAD AND NECK. TECHNIQUE: Contiguous axial images were obtained from the base of the skull through the vertex without intravenous contrast. Multi detector CTA of the head and neck during bolus administration of IV contrast. Multiplanar reformatted images, thick MIP images provided. COMPARISON:  Brain MRI 07/09/2021 at 2141 hours.  Head CT yesterday. FINDINGS: Brain: Mild motion artifact now. Stable non contrast CT appearance of the brain. No midline shift, ventriculomegaly, mass effect, evidence of mass lesion, intracranial hemorrhage or evidence of cortically based acute infarction. Vascular: No suspicious intracranial vascular hyperdensity. Skull: No acute osseous abnormality identified. Sinuses/Orbits: Visualized paranasal sinuses and mastoids are stable and well aerated. Other: Visualized orbits and scalp soft tissues are within normal limits. ASPECTS [facility] Stroke Program Early CT Score) Total score (0-10 with 10 being normal): 10 CTA NECK Skeleton: Absent and carious dentition. Mild for age cervical spine degeneration. No acute osseous abnormality identified. Upper  chest: Atelectasis. Other neck: Negative. Aortic arch: 3 vessel arch configuration.  No arch atherosclerosis. Right carotid system: Negative. Left carotid system: Negative. Vertebral arteries: Normal proximal right subclavian artery and right vertebral artery origin. Right vertebral artery appears patent and normal to the skull base. Normal proximal left subclavian artery and left vertebral artery origin. Left vertebral artery appears mildly dominant, mildly tortuous, patent and otherwise normal to  the skull base. CTA HEAD Posterior circulation: Slightly dominant left vertebral artery V4 segment. Normal vertebral arteries to the vertebrobasilar junction. Normal PICA origins. Patent basilar artery without stenosis. Patent SCA and PCA origins. Both posterior communicating arteries are present. Bilateral PCA branches are within normal limits. Anterior circulation: Both ICA siphons are patent. No siphon plaque or stenosis. Normal ophthalmic and posterior communicating artery origins. Patent carotid termini, MCA and ACA origins. Left ACA A1 appears mildly dominant. Anterior communicating artery and bilateral ACA branches are within normal limits. Left MCA M1 segment and bifurcation are patent without stenosis. Left MCA branches are within normal limits. Right MCA M1 segment and trifurcation are within normal limits. Right MCA branches are within normal limits. Venous sinuses: Patent. Anatomic variants: Mildly dominant left vertebral artery, left ACA A1 segment. Review of the MIP images confirms the above findings IMPRESSION: 1. Negative CTA Head and Neck. No large vessel occlusion, no atherosclerosis or stenosis identified. 2. Stable and negative for age noncontrast CT appearance of the brain. 3. Poor dentition. Electronically Signed   By: Genevie Ann M.D.   On: 07/10/2021 04:44   MR BRAIN WO CONTRAST  Result Date: 07/20/2021 CLINICAL DATA:  Neuro deficit, acute, stroke suspected EXAM: MRI HEAD WITHOUT CONTRAST TECHNIQUE: Multiplanar, multiecho pulse sequences of the brain and surrounding structures were obtained without intravenous contrast. COMPARISON:  Same day CT head.  MRI July 09, 2021. FINDINGS: Incomplete study. T2 and T1 weighted imaging was not performed. Within this limitation: Brain: No acute infarction, hemorrhage, hydrocephalus, extra-axial collection or mass lesion. Mild scattered T2 hyperintensities within the white matter, which are nonspecific but most likely related to chronic microvascular  ischemic disease. Vascular: Not evaluated due to the absence of T2 weighted imaging Skull and upper cervical spine: Normal marrow signal. Sinuses/Orbits: Left maxillary sinus retention cyst. Mild ethmoid air cell mucosal thickening. No acute orbital findings. Other: No sizable mastoid effusions. IMPRESSION: 1. Incomplete study. Within this limitation, no evidence of acute intracranial abnormality. Diffusion-weighted imaging is of good quality and there is no evidence of acute infarct. 2. Mild chronic microvascular ischemic disease. Electronically Signed   By: Margaretha Sheffield MD   On: 07/20/2021 18:02   MR BRAIN WO CONTRAST  Result Date: 07/09/2021 CLINICAL DATA:  Neuro deficit, acute stroke suspected. EXAM: MRI HEAD WITHOUT CONTRAST TECHNIQUE: Multiplanar, multiecho pulse sequences of the brain and surrounding structures were obtained without intravenous contrast. COMPARISON:  Same day head CT. FINDINGS: Motion limited evaluation.  Within limitation: Brain: No acute infarction, hemorrhage, hydrocephalus, extra-axial collection or mass lesion. Mild scattered T2 hyperintensities in the white matter, nonspecific but most likely related to mild chronic microvascular ischemic disease. Vascular: Major arterial flow voids are maintained at the skull base. Skull and upper cervical spine: Normal marrow signal. Sinuses/Orbits: Small left maxillary sinus retention cyst. Trace ethmoid air cell and frontal sinus mucosal thickening. No acute orbital findings. Other: No sizable mastoid effusions. IMPRESSION: 1. Motion limited exam without evidence of acute intracranial abnormality. Specifically, no acute infarct. 2. Mild chronic microvascular ischemic disease. Electronically Signed   By: Margaretha Sheffield  MD   On: 07/09/2021 20:13   MR BRAIN W WO CONTRAST  Result Date: 07/25/2021 CLINICAL DATA:  Initial evaluation for status altered mental status, confusion, hallucinations. Suspected aseptic meningitis. EXAM: MRI HEAD  WITHOUT AND WITH CONTRAST TECHNIQUE: Multiplanar, multiecho pulse sequences of the brain and surrounding structures were obtained without and with intravenous contrast. CONTRAST:  6m GADAVIST GADOBUTROL 1 MMOL/ML IV SOLN COMPARISON:  Previous MRI from 07/20/2021. FINDINGS: Brain: Cerebral volume within normal limits for age. Patchy T2/FLAIR hyperintensity involving the periventricular and deep white matter both cerebral hemispheres most consistent with chronic small vessel ischemic disease, mild in nature. No evidence for acute or subacute infarct. Gray-white matter differentiation maintained. No encephalomalacia to suggest chronic cortical infarction. No foci of susceptibility artifact to suggest acute or chronic intracranial hemorrhage. No mass lesion, midline shift or mass effect. Pituitary gland and suprasellar region within normal limits. Midline structures intact and normal. Following contrast administration, there is suspected mild diffuse leptomeningeal enhancement, most pronounced about the parieto-occipital aspects of both cerebral hemispheres. These changes are most obvious on coronal postcontrast sequence (series 19, image 7). Slightly more focal nodular focus of enhancement at the parasagittal left parieto-occipital region noted. There is subtly increased associated FLAIR signal intensity about these areas (series 10, images 18, 17 for example). No significant involvement of the mesial temporal lobes appreciated. No visible ependymal enhancement or intraventricular debris. No hydrocephalus. Vascular: Major intracranial vascular flow voids are maintained. Skull and upper cervical spine: Craniocervical junction within normal limits. Bone marrow signal intensity within normal limits. No scalp soft tissue abnormality. Sinuses/Orbits: Globes and orbital soft tissues within normal limits. Mild scattered mucosal thickening noted within the ethmoidal air cells. Left maxillary sinus retention cyst. Trace left  mastoid effusion, of doubtful significance. Inner ear structures grossly normal. Other: None. IMPRESSION: 1. Mild diffuse leptomeningeal enhancement with associated subtle sulcal FLAIR signal abnormality, most pronounced about the parieto-occipital regions of both cerebral hemispheres. 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 No hydrocephalus, infarct, or other complicating features. 2. No other acute intracranial abnormality. 3. Underlying mild chronic microvascular ischemic disease. Electronically Signed   By: BJeannine BogaM.D.   On: 07/25/2021 00:37   [place]  Result Date: 07/25/2021 CLINICAL DATA:  Acute renal insufficiency EXAM: RENAL / URINARY TRACT ULTRASOUND COMPLETE COMPARISON:  None. FINDINGS: Right Kidney: Renal measurements: 10.8 x 5.2 x 4.3 cm = volume: 126.3 mL. Contains a 5.1 cm cyst. Increased cortical echogenicity identified. Left Kidney: Renal measurements: 10.7 x 5.0 x 4.8 cm = volume: 134.3 mL. Increased cortical echogenicity Bladder: Appears normal for degree of bladder distention. Other: None. IMPRESSION: 1. Medical renal disease.  5 cm right renal cyst. Electronically Signed   By: DDorise BullionIII M.D.   On: 07/25/2021 11:00   DG Chest Portable 1 View  Result Date: 07/10/2021 CLINICAL DATA:  65year old male with altered mental status. EXAM: PORTABLE CHEST 1 VIEW COMPARISON:  Chest radiograph 05/24/2011. FINDINGS: Portable AP supine view at 0351 hours. Mildly rotated to the left. Lung volumes and mediastinal contours are stable and within normal limits. Visualized tracheal air column is within normal limits. Allowing for portable technique the lungs are clear. No pneumothorax or pleural effusion. No acute osseous abnormality identified. IMPRESSION: Negative portable chest. Electronically Signed   By: HGenevie AnnM.D.   On: 07/10/2021 04:45   EEG adult  Result Date: 07/21/2021 YLora Havens MD     07/21/2021 12:27 PM Patient  Name: HJOHNCHRISTOPHER SARVISMRN:  [id number] Epilepsy Attending: Lora Havens Referring Physician/Provider: Dr. Amie Portland Date: 07/21/2021 Duration: 24.25 mins Patient history: 65 year old male who presented with aphasia and confusion.  EEG to evaluate for seizures. Level of alertness: Awake, asleep AEDs during EEG study: None Technical aspects: This EEG study was done with scalp electrodes positioned according to the 10-20 International system of electrode placement. Electrical activity was acquired at a sampling rate of _0  and reviewed with a high frequency filter of _1  and a low frequency filter of _2 . EEG data were recorded continuously and digitally stored. Description: The posterior dominant rhythm consists of 8-9 Hz activity of moderate voltage (25-35 uV) seen predominantly in posterior head regions, symmetric and reactive to eye opening and eye closing. Sleep was characterized by vertex waves, sleep spindles (12 to 14 Hz), maximal frontocentral region. Physiologic photic driving was not seen during photic stimulation. Hyperventilation was not performed.   IMPRESSION: This study is within normal limits. No seizures or epileptiform discharges were seen throughout the recording. Lora Havens   EEG adult  Result Date: 07/10/2021 Lora Havens, MD     07/10/2021  2:55 PM Patient Name: DELYNN PURSLEY MRN: [id number] Epilepsy Attending: Lora Havens Referring Physician/Provider: Dr Kerney Elbe Date: 07/10/2021 Duration: 25.22 mins Patient history: 65yo M with new onset speech difficulty and confusion. EEG to evaluate for seizure Level of alertness: Awake, asleep AEDs during EEG study: None Technical aspects: This EEG study was done with scalp electrodes positioned according to the 10-20 International system of electrode placement. Electrical activity was acquired at a sampling rate of _3  and reviewed with a high frequency filter of _4  and a low frequency filter of _5 . EEG data were  recorded continuously and digitally stored. Description: The posterior dominant rhythm consists of 8-_6  activity of moderate voltage (25-35 uV) seen predominantly in posterior head regions, symmetric and reactive to eye opening and eye closing. Sleep was characterized by vertex waves, sleep spindles (12 to 14 Hz), maximal frontocentral region. Physiologic photic driving was seen during photic stimulation.  Hyperventilation was not performed.   IMPRESSION: This study is within normal limits. No seizures or epileptiform discharges were seen throughout the recording. Priyanka Barbra Sarks   Overnight EEG with video  Result Date: 07/22/2021 Lora Havens, MD     07/22/2021 12:06 PM Patient Name: TENOCH MCCLURE MRN: [id number] Epilepsy Attending: Lora Havens Referring Physician/Provider: Dr. Amie Portland Duration: 07/21/2021 1201 to 07/22/2021 1146  Patient history: 65 year old male who presented with aphasia and confusion.  EEG to evaluate for seizures.  Level of alertness: Awake, asleep  AEDs during EEG study: None  Technical aspects: This EEG study was done with scalp electrodes positioned according to the 10-20 International system of electrode placement. Electrical activity was acquired at a sampling rate of _7  and reviewed with a high frequency filter of _8  and a low frequency filter of _9 . EEG data were recorded continuously and digitally stored.  Description: The posterior dominant rhythm consists of 8-9 Hz activity of moderate voltage (25-35 uV) seen predominantly in posterior head regions, symmetric and reactive to eye opening and eye closing. Sleep was characterized by vertex waves, sleep spindles (12 to 14 Hz), maximal frontocentral region. Physiologic photic driving was not seen during photic stimulation. Hyperventilation was not performed.    IMPRESSION: This study is within normal limits. No seizures or epileptiform discharges were seen throughout the recording.  [place]   CT HEAD  CODE STROKE WO CONTRAST  Result  Date: 07/20/2021 CLINICAL DATA:  Code stroke.  65 year old male EXAM: CT HEAD WITHOUT CONTRAST TECHNIQUE: Contiguous axial images were obtained from the base of the skull through the vertex without intravenous contrast. COMPARISON:  07/10/2021. FINDINGS: Brain: No evidence of acute infarction, hemorrhage, hydrocephalus, extra-axial collection or mass lesion/mass effect. Vascular: No hyperdense vessel or unexpected calcification. Skull: Normal. Negative for fracture or focal lesion. Sinuses/Orbits: Mucous retention cyst in the left maxillary sinus. Mild mucosal thickening in the anterior ethmoid air cells. Other: None. ASPECTS [facility] Stroke Program Early CT Score) - Ganglionic level infarction (caudate, lentiform nuclei, internal capsule, insula, M1-M3 cortex): 7 - Supraganglionic infarction (M4-M6 cortex): 3 Total score (0-10 with 10 being normal): 10 IMPRESSION: 1. No acute infarct or other acute intracranial process. 2. ASPECTS is 10 Code stroke imaging results were communicated on 07/20/2021 at 4:47 pm to provider Rory Percy via secure text paging. Electronically Signed   By: Merilyn Baba MD   On: 07/20/2021 16:49   CT HEAD CODE STROKE WO CONTRAST  Result Date: 07/09/2021 CLINICAL DATA:  Code stroke. Neuro deficit, acute, stroke suspected. Additional history provided: Headache and slurred speech. EXAM: CT HEAD WITHOUT CONTRAST TECHNIQUE: Contiguous axial images were obtained from the base of the skull through the vertex without intravenous contrast. COMPARISON:  Head CT 12/11/2007.  Brain MRI 03/08/2004. FINDINGS: Brain: The examination is significantly motion degraded at the level of the maxillofacial structures and skull base. Cerebral volume is normal for age. There is no acute intracranial hemorrhage. No demarcated cortical infarct. No extra-axial fluid collection. No evidence of an intracranial mass. No midline shift. Vascular: No hyperdense vessel. Skull: No calvarial  fracture or focal suspicious calvarial lesion. Sinuses/Orbits: Visualized orbits show no acute finding. Left maxillary sinus mucous retention cyst. Trace mucosal thickening within the bilateral ethmoid air cells. ASPECTS [facility] Stroke Program Early CT Score) - Ganglionic level infarction (caudate, lentiform nuclei, internal capsule, insula, M1-M3 cortex): 7 - Supraganglionic infarction (M4-M6 cortex): 3 Total score (0-10 with 10 being normal): 10 These results were communicated to Dr. Rory Percy at 7:40 pmon 7/29/2022by text page via the [facility] messaging system. IMPRESSION: Significantly motion degraded examination at the level of the maxillofacial structures and skull base. No evidence of acute intracranial abnormality Paranasal sinus disease, as described. Electronically Signed   By: Kellie Simmering DO   On: 07/09/2021 19:40   VAS [place] UPPER EXTREMITY VENOUS DUPLEX  Result Date: 07/27/2021 UPPER VENOUS STUDY  Patient Name:  FILOMENO CROMLEY  Date of Exam:   07/27/2021 Medical Rec #: [id number]           Accession #:    [id number] Date of Birth: May 13, 1956           Patient Gender: M Patient Age:   48 years Exam Location:  [facility] Procedure:      VAS [place] UPPER EXTREMITY VENOUS DUPLEX Referring Phys: Raiford Noble --------------------------------------------------------------------------------  Indications: Infiltrated IV in left AC with Induration, and Pain Comparison Study: no prior Performing Technologist: Archie Patten RVS  Examination Guidelines: A complete evaluation includes B-mode imaging, spectral Doppler, color Doppler, and power Doppler as needed of all accessible portions of each vessel. Bilateral testing is considered an integral part of a complete examination. Limited examinations for reoccurring indications may be performed as noted.  Right Findings: +----------+------------+---------+-----------+----------+-------+ RIGHT     CompressiblePhasicitySpontaneousPropertiesSummary  +----------+------------+---------+-----------+----------+-------+ Subclavian    Full       Yes       Yes                      +----------+------------+---------+-----------+----------+-------+  Left Findings: +----------+------------+---------+-----------+----------+-----------------+ LEFT      CompressiblePhasicitySpontaneousProperties     Summary      +----------+------------+---------+-----------+----------+-----------------+ IJV           Full       Yes       Yes                                +----------+------------+---------+-----------+----------+-----------------+ Subclavian    Full       Yes       Yes                                +----------+------------+---------+-----------+----------+-----------------+ Axillary      Full       Yes       Yes                                +----------+------------+---------+-----------+----------+-----------------+ Brachial      Full       Yes       Yes                                +----------+------------+---------+-----------+----------+-----------------+ Radial        Full                                                    +----------+------------+---------+-----------+----------+-----------------+ Ulnar         Full                                                    +----------+------------+---------+-----------+----------+-----------------+ Cephalic      Full                                                    +----------+------------+---------+-----------+----------+-----------------+ Basilic       None                                  Age Indeterminate +----------+------------+---------+-----------+----------+-----------------+  Summary:  Right: No evidence of thrombosis in the subclavian.  Left: No evidence of deep vein thrombosis in the upper extremity. 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  *See table(s) above for  measurements and observations.  Diagnosing physician: Deitra Mayo MD Electronically signed by Deitra Mayo MD on 07/27/2021 at 3:24:15 PM.    Final    DG FL GUIDED LUMBAR PUNCTURE  Result Date: 07/21/2021 CLINICAL DATA:  Meningitis EXAM: DIAGNOSTIC LUMBAR PUNCTURE UNDER FLUOROSCOPIC GUIDANCE COMPARISON:  07/12/2021 FLUOROSCOPY TIME:  Fluoroscopy Time:  12 seconds Radiation Exposure Index (if provided by the fluoroscopic device): 1.5 mGy Number of Acquired Spot Images: 0 PROCEDURE: Informed consent was obtained from the patient prior to the procedure, including potential complications of headache, allergy, and pain. With  the patient prone, the lower back was prepped with Betadine. 1% Lidocaine was used for local anesthesia. Lumbar puncture was performed at the L3-L4 level using a 20 gauge needle with return of clear CSF with an opening pressure of 22 cm water. 12 ml of CSF were obtained for laboratory studies. The patient tolerated the procedure well and there were no apparent complications. IMPRESSION: Successful fluoroscopic guided lumbar puncture at L3-L4 yielding 12 mL of clear CSF. Electronically Signed   By: Maurine Simmering   On: 07/21/2021 09:47   DG FL GUIDED LUMBAR PUNCTURE  Result Date: 07/12/2021 CLINICAL DATA:  Patient admitted for headache, drowsiness, lethargy presents to radiology today for image guided lumbar puncture. EXAM: DIAGNOSTIC LUMBAR PUNCTURE UNDER FLUOROSCOPIC GUIDANCE COMPARISON:  None FLUOROSCOPY TIME:  Fluoroscopy Time: 42 seconds Radiation Exposure Index (if provided by the fluoroscopic device): 9.4 mGy Number of Acquired Spot Images: 1 PROCEDURE: Informed consent was obtained from the patient prior to the procedure, including potential complications of headache, allergy, and pain. With the patient prone, the lower back was prepped with Betadine. 1% Lidocaine was used for local anesthesia. Lumbar puncture was performed at the L4-L5 level using a 20 gauge needle with  return of clear CSF with an opening pressure of 22 cm water. 12 ml of CSF were obtained for laboratory studies. The patient tolerated the procedure well and there were no apparent complications. The needle was removed and the site covered with a band aid. IMPRESSION: Technically successful image guided L4-L5 lumbar puncture yielding 12 mL of clear CSF. Read by: Soyla Dryer, NP Study was performed by advanced practice provider as outlined above under my supervision, interpreted by me. Electronically Signed   By: Zetta Bills M.D.   On: 07/12/2021 10:09      Subjective: No concerns today  Discharge Exam: Vitals:   07/28/21 0842 07/28/21 1153  BP: 139/84 131/79  Pulse: 87 66  Resp: 19 20  Temp: 98.7 F (37.1 C) 98.2 F (36.8 C)  SpO2: 96% 97%   Vitals:   07/28/21 0423 07/28/21 0836 07/28/21 0842 07/28/21 1153  BP: (!) 154/77 131/76 139/84 131/79  Pulse: 78 88 87 66  Resp: _0 Temp: 98.6 F (37 C) 98.6 F (37 C) 98.7 F (37.1 C) 98.2 F (36.8 C)  TempSrc: Oral Oral Oral Oral  SpO2: 95% 97% 96% 97%  Weight:      Height:        General: Pt is alert, awake, not in acute distress Cardiovascular: RRR, S1/S2 +, no rubs, no gallops Respiratory: CTA bilaterally, no wheezing, no rhonchi Abdominal: Soft, NT, ND, bowel sounds + Extremities: no edema, no cyanosis    The results of significant diagnostics from this hospitalization (including imaging, microbiology, ancillary and laboratory) are listed below for reference.     Microbiology: Recent Results (from the past 240 hour(s))  Resp Panel by RT-PCR (Flu A&B, Covid) Nasopharyngeal Swab     Status: None   Collection Time: 07/20/21  5:09 PM   Specimen: Nasopharyngeal Swab; Nasopharyngeal(NP) swabs in vial transport medium  Result Value Ref Range Status   SARS Coronavirus 2 by RT PCR NEGATIVE NEGATIVE Final    Comment: (NOTE) SARS-CoV-2 target nucleic acids are NOT DETECTED.  The SARS-CoV-2 RNA is generally  detectable in upper respiratory specimens during the acute phase of infection. The lowest concentration of SARS-CoV-2 viral copies this assay can detect is 138 copies/mL. A negative result does not preclude SARS-Cov-2 infection and should not  be used as the sole basis for treatment or other patient management decisions. A negative result may occur with  improper specimen collection/handling, submission of specimen other than nasopharyngeal swab, presence of viral mutation(s) within the areas targeted by this assay, and inadequate number of viral copies(<138 copies/mL). A negative result must be combined with clinical observations, patient history, and epidemiological information. The expected result is Negative.  Fact Sheet for Patients:  EntrepreneurPulse.com.au  Fact Sheet for Healthcare Providers:  IncredibleEmployment.be  This test is no t yet approved or cleared by the [place] FDA and  has been authorized for detection and/or diagnosis of SARS-CoV-2 by FDA under an Emergency Use Authorization (EUA). This EUA will remain  in effect (meaning this test can be used) for the duration of the COVID-19 declaration under Section 564(b)(1) of the Act, 21 U.S.C.section 360bbb-3(b)(1), unless the authorization is terminated  or revoked sooner.       Influenza A by PCR NEGATIVE NEGATIVE Final   Influenza B by PCR NEGATIVE NEGATIVE Final    Comment: (NOTE) The Xpert Xpress SARS-CoV-2/FLU/RSV plus assay is intended as an aid in the diagnosis of influenza from Nasopharyngeal swab specimens and should not be used as a sole basis for treatment. Nasal washings and aspirates are unacceptable for Xpert Xpress SARS-CoV-2/FLU/RSV testing.  Fact Sheet for Patients: EntrepreneurPulse.com.au  Fact Sheet for Healthcare Providers: IncredibleEmployment.be  This test is not yet approved or cleared by the [place] FDA  and has been authorized for detection and/or diagnosis of SARS-CoV-2 by FDA under an Emergency Use Authorization (EUA). This EUA will remain in effect (meaning this test can be used) for the duration of the COVID-19 declaration under Section 564(b)(1) of the Act, 21 U.S.C. section 360bbb-3(b)(1), unless the authorization is terminated or revoked.  Performed at [facility], [place] [address] [place], [place] [address]   CSF culture w Gram Stain     Status: None   Collection Time: 07/21/21  9:08 AM   Specimen: PATH Cytology CSF; Cerebrospinal Fluid  Result Value Ref Range Status   Specimen Description CSF  Final   Special Requests NONE  Final   Gram Stain   Final    CYTOSPIN SMEAR WBC PRESENT, PREDOMINANTLY MONONUCLEAR NO ORGANISMS SEEN    Culture   Final    NO GROWTH 3 DAYS Performed at [facility], [place] [address] [place], [place] [address]    Report Status 07/24/2021 FINAL  Final  Culture, fungus without smear     Status: None (Preliminary result)   Collection Time: 07/21/21  9:08 AM   Specimen: PATH Cytology CSF; Cerebrospinal Fluid  Result Value Ref Range Status   Specimen Description CSF  Final   Special Requests NONE  Final   Culture   Final    NO FUNGUS ISOLATED AFTER 6 DAYS Performed at [facility], [place] [address], [place], [place] [address]    Report Status PENDING  Incomplete     Labs: BNP (last 3 results) Recent Labs    07/11/21 0132 07/12/21 0224 07/13/21 0325  BNP 52.7 54.3 59.7   Basic Metabolic Panel: Recent Labs  Lab 07/24/21 0432 07/25/21 0553 07/26/21 0241 07/27/21 0022 07/28/21 0119 07/28/21 0916  NA 135 138 139 138 138  --   K 3.6 3.9 3.9 3.9 3.9  --   CL 102 105 105 104 104  --   CO2 _0 --   GLUCOSE 120* 111* 104* 186*  201*  --   BUN 22 25* 26* 18 19  --   CREATININE 1.49* 2.18* 1.84* 1.29* 1.46* 1.23  CALCIUM 9.8 9.7 9.5 9.5 9.4  --   MG 1.9 2.1 2.2 2.0 1.8  --   PHOS 4.0 3.5 3.5 3.1 2.9  --     Liver Function Tests: Recent Labs  Lab 07/24/21 0432 07/25/21 0553 07/26/21 0241 07/27/21 0022 07/28/21 0119  AST _0 ALT 45* 45* 49* 42 40  ALKPHOS 53 54 54 50 50  BILITOT 1.0 0.7 0.6 0.4 0.5  PROT 7.6 7.0 6.9 6.7 6.9  ALBUMIN 3.7 3.3* 3.2* 3.1* 3.1*   No results for input(s): LIPASE, AMYLASE in the last 168 hours. No results for input(s): AMMONIA in the last 168 hours. CBC: Recent Labs  Lab 07/24/21 0432 07/25/21 0553 07/26/21 0241 07/27/21 0022 07/28/21 0119  WBC 10.3 5.5 7.0 6.8 8.1  NEUTROABS 7.5 2.7 3.3 4.2 3.3  HGB 15.1 14.4 13.7 13.2 13.4  HCT 44.5 43.5 42.0 38.9* 39.8  MCV 96.9 97.8 100.2* 97.0 97.3  PLT 185 176 180 174 173   Cardiac Enzymes: No results for input(s): CKTOTAL, CKMB, CKMBINDEX, TROPONINI in the last 168 hours. BNP: Invalid input(s): POCBNP CBG: Recent Labs  Lab 07/27/21 1620 07/27/21 2058 07/28/21 0632 07/28/21 0647 07/28/21 1157  GLUCAP 228* 199* 134* 157* 135*   D-Dimer No results for input(s): DDIMER in the last 72 hours. Hgb A1c No results for input(s): HGBA1C in the last 72 hours. Lipid Profile No results for input(s): CHOL, HDL, LDLCALC, TRIG, CHOLHDL, LDLDIRECT in the last 72 hours. Thyroid function studies No results for input(s): TSH, T4TOTAL, T3FREE, THYROIDAB in the last 72 hours.  Invalid input(s): FREET3 Anemia work up No results for input(s): VITAMINB12, FOLATE, FERRITIN, TIBC, IRON, RETICCTPCT in the last 72 hours. Urinalysis    Component Value Date/Time   COLORURINE YELLOW 07/25/2021 0932   APPEARANCEUR HAZY (A) 07/25/2021 0932   LABSPEC 1.011 07/25/2021 0932   PHURINE 6.0 07/25/2021 0932   GLUCOSEU 150 (A) 07/25/2021 0932   HGBUR LARGE (A) 07/25/2021 0932   HGBUR negative 12/27/2010 0801   BILIRUBINUR NEGATIVE 07/25/2021 0932   KETONESUR NEGATIVE 07/25/2021 0932   PROTEINUR 30 (A) 07/25/2021 0932   UROBILINOGEN 0.2 05/24/2011 1003   NITRITE NEGATIVE 07/25/2021 0932   LEUKOCYTESUR NEGATIVE  07/25/2021 0932   Sepsis Labs Invalid input(s): PROCALCITONIN,  WBC,  LACTICIDVEN Microbiology Recent Results (from the past 240 hour(s))  Resp Panel by RT-PCR (Flu A&B, Covid) Nasopharyngeal Swab     Status: None   Collection Time: 07/20/21  5:09 PM   Specimen: Nasopharyngeal Swab; Nasopharyngeal(NP) swabs in vial transport medium  Result Value Ref Range Status   SARS Coronavirus 2 by RT PCR NEGATIVE NEGATIVE Final    Comment: (NOTE) SARS-CoV-2 target nucleic acids are NOT DETECTED.  The SARS-CoV-2 RNA is generally detectable in upper respiratory specimens during the acute phase of infection. The lowest concentration of SARS-CoV-2 viral copies this assay can detect is 138 copies/mL. A negative result does not preclude SARS-Cov-2 infection and should not be used as the sole basis for treatment or other patient management decisions. A negative result may occur with  improper specimen collection/handling, submission of specimen other than nasopharyngeal swab, presence of viral mutation(s) within the areas targeted by this assay, and inadequate number of viral copies(<138 copies/mL). A negative result must be combined with clinical observations, patient history, and epidemiological information. The expected result is Negative.  Fact Sheet for Patients:  EntrepreneurPulse.com.au  Fact Sheet for Healthcare Providers:  IncredibleEmployment.be  This test is no t yet approved or cleared by the [place] FDA and  has been authorized for detection and/or diagnosis of SARS-CoV-2 by FDA under an Emergency Use Authorization (EUA). This EUA will remain  in effect (meaning this test can be used) for the duration of the COVID-19 declaration under Section 564(b)(1) of the Act, 21 U.S.C.section 360bbb-3(b)(1), unless the authorization is terminated  or revoked sooner.       Influenza A by PCR NEGATIVE NEGATIVE Final   Influenza B by PCR NEGATIVE  NEGATIVE Final    Comment: (NOTE) The Xpert Xpress SARS-CoV-2/FLU/RSV plus assay is intended as an aid in the diagnosis of influenza from Nasopharyngeal swab specimens and should not be used as a sole basis for treatment. Nasal washings and aspirates are unacceptable for Xpert Xpress SARS-CoV-2/FLU/RSV testing.  Fact Sheet for Patients: EntrepreneurPulse.com.au  Fact Sheet for Healthcare Providers: IncredibleEmployment.be  This test is not yet approved or cleared by the [place] FDA and has been authorized for detection and/or diagnosis of SARS-CoV-2 by FDA under an Emergency Use Authorization (EUA). This EUA will remain in effect (meaning this test can be used) for the duration of the COVID-19 declaration under Section 564(b)(1) of the Act, 21 U.S.C. section 360bbb-3(b)(1), unless the authorization is terminated or revoked.  Performed at [facility], [place] [address] [place], [place] [address]   CSF culture w Gram Stain     Status: None   Collection Time: 07/21/21  9:08 AM   Specimen: PATH Cytology CSF; Cerebrospinal Fluid  Result Value Ref Range Status   Specimen Description CSF  Final   Special Requests NONE  Final   Gram Stain   Final    CYTOSPIN SMEAR WBC PRESENT, PREDOMINANTLY MONONUCLEAR NO ORGANISMS SEEN    Culture   Final    NO GROWTH 3 DAYS Performed at [facility], [place] [address], [place], [place] [address]    Report Status 07/24/2021 FINAL  Final  Culture, fungus without smear     Status: None (Preliminary result)   Collection Time: 07/21/21  9:08 AM   Specimen: PATH Cytology CSF; Cerebrospinal Fluid  Result Value Ref Range Status   Specimen Description CSF  Final   Special Requests NONE  Final   Culture   Final    NO FUNGUS ISOLATED AFTER 6 DAYS Performed at [facility], [place] [address], [place], [place] [address]    Report Status PENDING  Incomplete     Time coordinating discharge: 35  minutes  SIGNED:   Cordelia Poche, MD [facility] 07/28/2021, 12:40 PM

## 2021-07-28 NOTE — Progress Notes (Signed)
NURSING PROGRESS NOTE  [name] [id number] Discharge Data: 07/28/2021 2:31 PM Attending Provider: Mariel Aloe, MD PBD:HDIXBOER, Tommi Rumps, NP     Archie Endo to be D/C'd Home per MD order.  Discussed with the patient and his wife, the After Visit Summary and all questions fully answered. All IV's discontinued with no bleeding noted. All belongings returned to patient for patient to take home.   Last Vital Signs:  Blood pressure 131/79, pulse 66, temperature 98.2 F (36.8 C), temperature source Oral, resp. rate 20, height '6\' 3"'  (1.905 m), weight 106.4 kg, SpO2 97 %.  Discharge Medication List Allergies as of 07/28/2021       Reactions   Lisinopril    hyperkalemia   Reglan [metoclopramide] Anxiety   Patient had bad reaction requiring ativan in addition to benadryl. He doesn't want to receive it any more        Medication List     STOP taking these medications    ibuprofen 200 MG tablet Commonly known as: ADVIL       TAKE these medications    aspirin-acetaminophen-caffeine 250-250-65 MG tablet Commonly known as: EXCEDRIN MIGRAINE Take 1 tablet by mouth every 6 (six) hours as needed for headache.   CENTRUM SILVER PO Take 1 tablet by mouth daily.   diltiazem 120 MG 24 hr capsule Commonly known as: CARDIZEM CD TAKE 1 CAPSULE BY MOUTH EVERY DAY   glipiZIDE 10 MG 24 hr tablet Commonly known as: GLUCOTROL XL Take 1 tablet (10 mg total) by mouth daily with breakfast.   glucose blood test strip Commonly known as: ONE TOUCH ULTRA TEST USE TO TEST BLOOD GLUCOSE TWICE DAILY   Jardiance 25 MG Tabs tablet Generic drug: empagliflozin TAKE 1 TABLET (25 MG TOTAL) BY MOUTH DAILY.   metFORMIN 500 MG 24 hr tablet Commonly known as: Glucophage XR Take 2 tablets (1,000 mg total) by mouth in the morning and at bedtime.   ONE TOUCH ULTRA 2 w/Device Kit Use the device daily to check your blood sugars 1-4 times as instructed.   onetouch ultrasoft lancets Use to  check blood sugar BID   rosuvastatin 5 MG tablet Commonly known as: Crestor Take 1 tablet (5 mg total) by mouth at bedtime.   vitamin B-12 1000 MCG tablet Commonly known as: CYANOCOBALAMIN Take 1 tablet (1,000 mcg total) by mouth daily.

## 2021-07-29 ENCOUNTER — Telehealth: Payer: Self-pay

## 2021-07-29 NOTE — Telephone Encounter (Signed)
Transition Care Management Unsuccessful Follow-up Telephone Call  Date of discharge and from where:  07/28/2021  [name]   Attempts:  2nd Attempt  Reason for unsuccessful TCM follow-up call:  No answer/busy

## 2021-07-30 ENCOUNTER — Inpatient Hospital Stay: Payer: Medicare Other | Admitting: Internal Medicine

## 2021-08-02 ENCOUNTER — Other Ambulatory Visit: Payer: Self-pay

## 2021-08-02 LAB — CULTURE, FUNGUS WITHOUT SMEAR

## 2021-08-02 LAB — MISC LABCORP TEST (SEND OUT): Labcorp test code: 9985

## 2021-08-03 ENCOUNTER — Encounter: Payer: Self-pay | Admitting: Adult Health

## 2021-08-03 ENCOUNTER — Ambulatory Visit (INDEPENDENT_AMBULATORY_CARE_PROVIDER_SITE_OTHER): Payer: Medicare Other | Admitting: Adult Health

## 2021-08-03 VITALS — BP 140/82 | HR 88 | Temp 98.9°F | Ht 75.0 in | Wt 235.0 lb

## 2021-08-03 DIAGNOSIS — N179 Acute kidney failure, unspecified: Secondary | ICD-10-CM

## 2021-08-03 DIAGNOSIS — G9340 Encephalopathy, unspecified: Secondary | ICD-10-CM

## 2021-08-03 DIAGNOSIS — E1169 Type 2 diabetes mellitus with other specified complication: Secondary | ICD-10-CM

## 2021-08-03 DIAGNOSIS — R7401 Elevation of levels of liver transaminase levels: Secondary | ICD-10-CM

## 2021-08-03 DIAGNOSIS — E782 Mixed hyperlipidemia: Secondary | ICD-10-CM

## 2021-08-03 DIAGNOSIS — G032 Benign recurrent meningitis [Mollaret]: Secondary | ICD-10-CM

## 2021-08-03 DIAGNOSIS — I10 Essential (primary) hypertension: Secondary | ICD-10-CM | POA: Diagnosis not present

## 2021-08-03 NOTE — Patient Instructions (Signed)
It was great seeing you today   I am glad you are feeling better.   We will follow up with you regarding your blood work

## 2021-08-03 NOTE — Progress Notes (Signed)
Subjective:    Patient ID: Jason [name], male    DOB: [birth year], 65 y.o.   MRN: [id number]  HPI 65 year old male who  has a past medical history of Bell palsy, Diabetes mellitus without complication ([place]), and HTN (hypertension).  He presents to the office today for TCM visit   Admit Date 07/20/2021 Discharge Date 07/28/2021  He presented to the ED on 729/730 with 1 week of headache progressing to confusion.  He was admitted to hospital service 7/30 through 8/4.  Work-up during that admission was suggestive of a septic meningitis  CSF: 165 WBC, 98% lymphs, did have 81 protein, 66 glucose.  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  Neurology and ID consulted.  EEG/LTM EEG significant for no evidence of seizure activity.  MRI showed significant for findings compatible with acute/recurrent meningeal encephalitis.  Neurology with final recommendations for supportive care and outpatient neurology follow-up.  Acute metabolic encephalopathy -Insetting above.  Patient became delusional during hospitalization, trying to elope.  He was involuntary committed.  Psychiatry was consulted and initially started on Haldol/Seroquel and transition to Zyprexa with recommendations to continue on discharge; they also recommended outpatient behavioral health follow-up  AKI -Possibly in setting of acyclovir.  Peak creatinine to 2.18 which improved with IV hydration  Diabetes mellitus, type II -Continue metformin, Jardiance, and glipizide  Elevated ALT Mildly  elevated.  Improved  Superficial thrombophlebitis -Left arm.  No DVT noted.  Supportive care  Hyperlipidemia  - Continued on  Crestor   Primary Hypertension  -Continued on diltiazem  Today he reports that he is feeling " a lot better". He continues to have mild low grade headaches intermittently and feels fatigued. He does not endorse fevers, chills, shortness of breath, chest pain, confusion, or change sin vision.   He has been monitoring his blood sugars at home and reports readings in 80-100's.   He has an appointment send up with ID in a few days and will see Neurology next month.    Review of Systems  Constitutional:  Positive for fatigue.  HENT: Negative.    Eyes: Negative.   Respiratory: Negative.    Cardiovascular: Negative.   Gastrointestinal: Negative.   Genitourinary: Negative.   Musculoskeletal: Negative.   Skin: Negative.   Neurological:  Positive for headaches. Negative for dizziness, tremors, seizures, syncope, speech difficulty, weakness and numbness.  Hematological: Negative.   Psychiatric/Behavioral: Negative.    All other systems reviewed and are negative. Past Medical History:  Diagnosis Date   Bell palsy    Diabetes mellitus without complication (HCC)    HTN (hypertension)     Social History   Socioeconomic History   Marital status: Married    Spouse name: Not on file   Number of children: 2   Years of education: 12   Highest education level: Not on file  Occupational History   Occupation: transportation    Comment: Publishing rights manager  Tobacco Use   Smoking status: Never   Smokeless tobacco: Never  Vaping Use   Vaping Use: Never used  Substance and Sexual Activity   Alcohol use: Yes    Alcohol/week: 6.0 standard drinks    Types: 6 Cans of beer per  week    Comment: on weekends   Drug use: No   Sexual activity: Not on file  Other Topics Concern   Not on file  Social History Narrative      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       Fun: fix cars, drag race    Social Determinants of Systems developer Strain: Low Risk    Difficulty of Paying Living Expenses: Not hard at all  Food Insecurity: No Food Insecurity   Worried About Charity fundraiser in the Last Year: Never true   Arboriculturist in the Last Year: Never true  Transportation Needs: No Transportation Needs   Lack of Transportation (Medical): No   Lack of Transportation (Non-Medical): No  Physical Activity: Insufficiently Active   Days of Exercise per Week: 3 days   Minutes of Exercise per Session: 30 min  Stress: No Stress Concern Present   Feeling of Stress : Not at all  Social Connections: Moderately Isolated   Frequency of Communication with Friends and Family: More than three times a week   Frequency of Social Gatherings with Friends and Family: More than three times a week   Attends Religious Services: More than 4 times per year   Active Member of [employer] or Organizations: No   Attends Archivist Meetings: Never   Marital Status: Widowed  Human resources officer Violence: Not At Risk   Fear of Current or Ex-Partner: No   Emotionally Abused: No   Physically Abused: No   Sexually Abused: No    Past Surgical History:  Procedure Laterality Date   COLONOSCOPY W/ POLYPECTOMY      Family History  Problem Relation Age of Onset   Stroke Mother    Colon cancer Sister    High blood pressure Sister    Colon cancer Brother    High blood pressure Brother    Stomach cancer Brother    Lymphoma Son    Colon cancer Other        1st degree relative <60   Diabetes Other        1st degree relative    Stroke Other    Esophageal cancer Neg Hx    Rectal cancer Neg Hx     Allergies  Allergen Reactions   Lisinopril     hyperkalemia   Reglan [Metoclopramide] Anxiety    Patient had bad reaction requiring ativan in addition to benadryl. He doesn't want to receive it any more    Current Outpatient Medications on File Prior to Visit  Medication Sig Dispense Refill   aspirin-acetaminophen-caffeine (EXCEDRIN  MIGRAINE) 250-250-65 MG tablet Take 1 tablet by mouth every 6 (six) hours as needed for headache.     Blood Glucose Monitoring Suppl (ONE TOUCH ULTRA 2) w/Device KIT Use the device daily to check your blood sugars 1-4 times as instructed. 1 kit 0   diltiazem (CARDIZEM CD) 120 MG 24 hr capsule TAKE 1 CAPSULE BY MOUTH EVERY DAY 90 capsule 3   glipiZIDE (GLUCOTROL XL) 10 MG 24 hr tablet Take 1 tablet (10 mg total) by mouth daily with breakfast. 90 tablet 0   glucose blood (ONE TOUCH ULTRA TEST) test strip USE TO TEST BLOOD GLUCOSE TWICE DAILY 200 each 3   JARDIANCE 25 MG TABS tablet TAKE 1 TABLET (25 MG TOTAL) BY MOUTH DAILY. 90 tablet 0   Lancets (ONETOUCH ULTRASOFT)  lancets Use to check blood sugar BID 100 each 12   metFORMIN (GLUCOPHAGE XR) 500 MG 24 hr tablet Take 2 tablets (1,000 mg total) by mouth in the morning and at bedtime. 360 tablet 0   Multiple Vitamins-Minerals (CENTRUM SILVER PO) Take 1 tablet by mouth daily.     rosuvastatin (CRESTOR) 5 MG tablet Take 1 tablet (5 mg total) by mouth at bedtime. 90 tablet 3   vitamin B-12 (CYANOCOBALAMIN) 1000 MCG tablet Take 1 tablet (1,000 mcg total) by mouth daily. 30 tablet 0   No current facility-administered medications on file prior to visit.    BP 140/82   Pulse 88   Temp 98.9 F (37.2 C) (Oral)   Ht $R'6\' 3"'Pp$  (1.905 m)   Wt 235 lb (106.6 kg)   SpO2 98%   BMI 29.37 kg/m       Objective:   Physical Exam Vitals and nursing note reviewed.  Constitutional:      Appearance: Normal appearance.  Eyes:     Extraocular Movements: Extraocular movements intact.     Conjunctiva/sclera: Conjunctivae normal.     Pupils: Pupils are equal, round, and reactive to light.  Cardiovascular:     Rate and Rhythm: Normal rate and regular rhythm.     Pulses: Normal pulses.     Heart sounds: Normal heart sounds.  Pulmonary:     Effort: Pulmonary effort is normal.     Breath sounds: Normal breath sounds.  Musculoskeletal:        General: Normal range  of motion.  Skin:    General: Skin is warm and dry.  Neurological:     General: No focal deficit present.     Mental Status: He is alert and oriented to person, place, and time.     Cranial Nerves: No cranial nerve deficit.     Sensory: No sensory deficit.     Motor: No weakness.     Coordination: Coordination normal.     Gait: Gait normal.     Deep Tendon Reflexes: Reflexes normal.  Psychiatric:        Mood and Affect: Mood normal.        Behavior: Behavior normal.        Thought Content: Thought content normal.        Judgment: Judgment normal.      Assessment & Plan:  1. Mollaret's meningitis Reviewed hospital note, labs, imaging, and discharge instructions with the patient. All questions answered to the best of my ability  - Follow up with ID and Neurology as directed - Follow up here PRN  - No changes in medications  - CBC with Differential/Platelet; Future - Comprehensive metabolic panel; Future - Comprehensive metabolic panel - CBC with Differential/Platelet  2. Acute encephalopathy - Resolved  - CBC with Differential/Platelet; Future - Comprehensive metabolic panel; Future - Comprehensive metabolic panel - CBC with Differential/Platelet  3. Type 2 diabetes mellitus with other specified complication, without long-term current use of insulin ([place]) - Continue home medications  - CBC with Differential/Platelet; Future - Comprehensive metabolic panel; Future - Comprehensive metabolic panel - CBC with Differential/Platelet  4. Essential hypertension - Continue Diltiazem  - CBC with Differential/Platelet; Future - Comprehensive metabolic panel; Future - Comprehensive metabolic panel - CBC with Differential/Platelet  5. AKI (acute kidney injury) ([place])  - CBC with Differential/Platelet; Future - Comprehensive metabolic panel; Future - Comprehensive metabolic panel - CBC with Differential/Platelet  6. Elevated ALT measurement  - CBC with  Differential/Platelet; Future - Comprehensive  metabolic panel; Future - Comprehensive metabolic panel - CBC with Differential/Platelet  7. Mixed hyperlipidemia - Consider statin  - CBC with Differential/Platelet; Future - Comprehensive metabolic panel; Future - Comprehensive metabolic panel - CBC with Differential/Platelet  [name], NP

## 2021-08-04 LAB — COMPREHENSIVE METABOLIC PANEL
ALT: 36 U/L (ref 0–53)
AST: 20 U/L (ref 0–37)
Albumin: 4 g/dL (ref 3.5–5.2)
Alkaline Phosphatase: 64 U/L (ref 39–117)
BUN: 17 mg/dL (ref 6–23)
CO2: 27 mEq/L (ref 19–32)
Calcium: 10.6 mg/dL — ABNORMAL HIGH (ref 8.4–10.5)
Chloride: 106 mEq/L (ref 96–112)
Creatinine, Ser: 1.41 mg/dL (ref 0.40–1.50)
GFR: 51.65 mL/min — ABNORMAL LOW (ref >60.00)
Glucose, Bld: 95 mg/dL (ref 70–99)
Potassium: 5 mEq/L (ref 3.5–5.1)
Sodium: 141 mEq/L (ref 135–145)
Total Bilirubin: 0.3 mg/dL (ref 0.2–1.2)
Total Protein: 8.1 g/dL (ref 6.0–8.3)

## 2021-08-04 LAB — CBC WITH DIFFERENTIAL/PLATELET
Basophils Absolute: 0.1 10*3/uL (ref 0.0–0.1)
Basophils Relative: 0.8 % (ref 0.0–3.0)
Eosinophils Absolute: 0.2 10*3/uL (ref 0.0–0.7)
Eosinophils Relative: 2.8 % (ref 0.0–5.0)
HCT: 43 % (ref 39.0–52.0)
Hemoglobin: 14 g/dL (ref 13.0–17.0)
Lymphocytes Relative: 46.6 % — ABNORMAL HIGH (ref 12.0–46.0)
Lymphs Abs: 3 10*3/uL (ref 0.7–4.0)
MCHC: 32.6 g/dL (ref 30.0–36.0)
MCV: 99.1 fl (ref 78.0–100.0)
Monocytes Absolute: 0.4 10*3/uL (ref 0.1–1.0)
Monocytes Relative: 6.7 % (ref 3.0–12.0)
Neutro Abs: 2.8 10*3/uL (ref 1.4–7.7)
Neutrophils Relative %: 43.1 % (ref 43.0–77.0)
Platelets: 268 10*3/uL (ref 150.0–400.0)
RBC: 4.34 Mil/uL (ref 4.22–5.81)
RDW: 14.7 % (ref 11.5–15.5)
WBC: 6.5 10*3/uL (ref 4.0–10.5)

## 2021-08-04 MED ORDER — ALBUTEROL (5 MG/ML) CONTINUOUS INHALATION SOLN
INHALATION_SOLUTION | RESPIRATORY_TRACT | Status: AC
  Filled 2021-08-04: qty 20

## 2021-08-05 ENCOUNTER — Institutional Professional Consult (permissible substitution): Payer: Medicare Other | Admitting: Internal Medicine

## 2021-08-09 ENCOUNTER — Telehealth: Payer: Self-pay | Admitting: Adult Health

## 2021-08-09 NOTE — Telephone Encounter (Signed)
Pt call and stated he want to a referral to go to a Nutritionist and want a call back.

## 2021-08-09 NOTE — Telephone Encounter (Signed)
Please advise 

## 2021-08-10 ENCOUNTER — Other Ambulatory Visit: Payer: Self-pay | Admitting: Adult Health

## 2021-08-10 DIAGNOSIS — E1169 Type 2 diabetes mellitus with other specified complication: Secondary | ICD-10-CM

## 2021-08-10 NOTE — Telephone Encounter (Signed)
Patient notified of update  and verbalized understanding. 

## 2021-08-11 ENCOUNTER — Ambulatory Visit (INDEPENDENT_AMBULATORY_CARE_PROVIDER_SITE_OTHER): Payer: Medicare Other | Admitting: Internal Medicine

## 2021-08-11 ENCOUNTER — Other Ambulatory Visit: Payer: Self-pay

## 2021-08-11 VITALS — BP 131/85 | HR 89 | Temp 98.2°F | Resp 16 | Ht 75.0 in | Wt 235.0 lb

## 2021-08-11 DIAGNOSIS — D729 Disorder of white blood cells, unspecified: Secondary | ICD-10-CM

## 2021-08-11 DIAGNOSIS — G0490 Encephalitis and encephalomyelitis, unspecified: Secondary | ICD-10-CM

## 2021-08-11 LAB — CULTURE, FUNGUS WITHOUT SMEAR

## 2021-08-11 NOTE — Patient Instructions (Signed)
No need to f/u  Unclear if this is infectious cause or noninfectious cause   Please see your neurologist for further input

## 2021-08-11 NOTE — Progress Notes (Signed)
[facility]  Patient Active Problem List   Diagnosis Date Noted   CSF pleocytosis    Encephalopathy acute 07/21/2021   Acute encephalopathy 07/20/2021   Mollaret's meningitis 07/20/2021   Tick bite 07/20/2021   AMS (altered mental status) 07/10/2021   Marijuana abuse 07/10/2021   Class 1 obesity due to excess calories with body mass index (BMI) of 30.0 to 30.9 in adult 07/10/2021   Routine general medical examination at a health care facility 02/02/2016   Type 2 diabetes mellitus ([place]) 06/12/2012   Syncope - neurally mediated 07/04/2011   Essential hypertension 12/24/2007   HYPERGLYCEMIA 12/24/2007      Subjective:    Patient ID: Jason [name], male    DOB: 06-04-1956, 65 y.o.   MRN: [id number]  Chief Complaint  Patient presents with   Hospitalization Follow-up    HPI:  Jason [name] is a 65 y.o. male here for hospital f/u encephalitis   I consulted on him during his admission on 8/10: I reviewed note and discussed with him/his wife regarding presentation   He has acute onset confusion/headache and was admitted 7/29. No sepsis prodrome or documentation of such during admission. Had LP which showed 165 wbc lyphocytic predominance, normal protein/glucose. Negative culture, hsv pcr. On bsAbx/acyclovir but d/c'ed once csf analysis negative. Sx resolved during admission   He did well until 8/9 when he was working outside in hot weather on his car with his son. When his wife found him later he appears slightly confused/staring blank and complaining of headache. So he was brought to hospital again. No sepsis prodrome/documentation so far   He said his headache already resolved by the time I see him today Normal routine labs. No fever   He is on doxy/acyclovir   No rash, joint pain, n/v/diarrhea   His wife reports his son was dx'ed with some kind of brain cancer in his 40s   No new medications   Take excedrine migraine prn  for intermittent headache   Abx stopped early with return of negative hsv pcr  --------------------- 08/11/21 id clinic f/u Patient to see neurology next month. His wife accompanies him today -- she is still concerned about his headache. He thinks he is still cloudy at times -- mostly remembering the right date.  No f/c No n/v No rash No numbness/tingling anywhere  [facility] hospital labs --> [facility] csf igg/igm negative; rmsf serology negative; lyme, syphilis screen, hsv 1/2 pcr negative. Not much non-id w/u sent      Allergies  Allergen Reactions   Lisinopril     hyperkalemia   Reglan [Metoclopramide] Anxiety    Patient had bad reaction requiring ativan in addition to benadryl. He doesn't want to receive it any more      Outpatient Medications Prior to Visit  Medication Sig Dispense Refill   aspirin-acetaminophen-caffeine (EXCEDRIN MIGRAINE) 250-250-65 MG tablet Take 1 tablet by mouth every 6 (six) hours as needed for headache.     Blood Glucose Monitoring Suppl (ONE TOUCH ULTRA 2) w/Device KIT Use the device daily to check your blood sugars 1-4 times as instructed. 1 kit 0   diltiazem (CARDIZEM CD) 120 MG 24 hr capsule TAKE 1 CAPSULE BY MOUTH EVERY DAY 90 capsule 3   glipiZIDE (GLUCOTROL XL) 10 MG 24 hr tablet Take 1 tablet (10 mg total) by mouth daily with breakfast. 90 tablet 0   glucose blood (ONE TOUCH ULTRA TEST) test strip USE TO  TEST BLOOD GLUCOSE TWICE DAILY 200 each 3   JARDIANCE 25 MG TABS tablet TAKE 1 TABLET (25 MG TOTAL) BY MOUTH DAILY. 90 tablet 0   Lancets (ONETOUCH ULTRASOFT) lancets Use to check blood sugar BID 100 each 12   metFORMIN (GLUCOPHAGE XR) 500 MG 24 hr tablet Take 2 tablets (1,000 mg total) by mouth in the morning and at bedtime. 360 tablet 0   Multiple Vitamins-Minerals (CENTRUM SILVER PO) Take 1 tablet by mouth daily.     rosuvastatin (CRESTOR) 5 MG tablet Take 1 tablet (5 mg total) by mouth at bedtime. 90 tablet 3   vitamin B-12  (CYANOCOBALAMIN) 1000 MCG tablet Take 1 tablet (1,000 mcg total) by mouth daily. 30 tablet 0   No facility-administered medications prior to visit.     Social History   Socioeconomic History   Marital status: Married    Spouse name: Not on file   Number of children: 2   Years of education: 12   Highest education level: Not on file  Occupational History   Occupation: transportation    Comment: Publishing rights manager  Tobacco Use   Smoking status: Never   Smokeless tobacco: Never  Vaping Use   Vaping Use: Never used  Substance and Sexual Activity   Alcohol use: Yes    Alcohol/week: 6.0 standard drinks    Types: 6 Cans of beer per week    Comment: on weekends   Drug use: No   Sexual activity: Not on file  Other Topics Concern   Not on file  Social History Narrative      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       Fun: fix cars, drag race    Social Determinants of Radio broadcast assistant Strain: Low Risk    Difficulty of Paying Living Expenses: Not hard at all  Food Insecurity: No Food Insecurity   Worried About Charity fundraiser in the Last Year: Never true   Arboriculturist in the Last Year: Never true  Transportation Needs: No Transportation Needs   Lack of Transportation (Medical): No   Lack of Transportation (Non-Medical): No  Physical Activity: Insufficiently Active   Days of Exercise per Week: 3 days   Minutes of Exercise per Session: 30 min  Stress: No Stress Concern Present   Feeling of Stress : Not at all  Social Connections: Moderately Isolated   Frequency of Communication with Friends and Family: More than three times a week   Frequency of Social Gatherings with Friends and Family: More than three times a week   Attends Religious Services: More than 4 times per year   Active Member of [employer] or Organizations: No   Attends Archivist Meetings: Never   Marital Status: Widowed  Human resources officer  Violence: Not At Risk   Fear of Current or Ex-Partner: No   Emotionally Abused: No   Physically Abused: No   Sexually Abused: No      Review of Systems     Objective:    BP 131/85   Pulse 89   Temp 98.2 F (36.8 C) (Oral)   Resp 16   Ht 6' 3" (1.905 m)   Wt 235 lb (106.6 kg)   SpO2 98%   BMI 29.37 kg/m  Nursing note and vital signs reviewed.  Physical Exam     General/constitutional: no distress, pleasant HEENT: Normocephalic, PER, Conj  Clear, EOMI, Oropharynx clear Neck supple CV: rrr no mrg Lungs: clear to auscultation, normal respiratory effort Abd: Soft, Nontender Ext: no edema Skin: No Rash Neuro: nonfocal MSK: no peripheral joint swelling/tenderness/warmth; back spines nontender    Labs:  Micro:  Serology:  Imaging: 8/9 brain mri   8/9 mri brain without contrast Incomplete study. T2 and T1 weighted imaging was not performed. Within this limitation, no evidence of acute intracranial abnormality. Diffusion-weighted imaging is of good quality and there is no evidence of acute infarct. 2. Mild chronic microvascular ischemic disease.  Assessment & Plan:   Problem List Items Addressed This Visit       Other   CSF pleocytosis - Primary   Other Visit Diagnoses     Encephalitis             No orders of the defined types were placed in this encounter.   It seems he had the index episode late July and readmitted for waxing waning sx but overall seems to have been recovering since index episode  Extensive ID causes have been excluded. There are some arbovirus serology that were not done due to lack of csf fluid. Regardless nothing to offer for those infections if positive. Hsv 2 is known to cause recurrent meningitis but this was negative   At this juncture, would just monitor, and likely repeat workup along with autommune/paraneplastic testing will need to be sent if another episode occurs.  Follow up with neurology as needed  No clear  cause at this time.     Follow-up: No follow-ups on file.   I have spent a total of 20 minutes of face-to-face and non-face-to-face time, excluding clinical staff time, preparing to see patient, ordering tests and/or medications, and provide counseling the patient    Jabier Mutton, [place] [facility] [place] [phone]  pager   [phone] cell 08/11/2021, 2:53 PM

## 2021-08-20 ENCOUNTER — Telehealth: Payer: Self-pay | Admitting: Pharmacist

## 2021-08-20 NOTE — Chronic Care Management (AMB) (Signed)
Chronic Care Management Pharmacy Assistant   Name: Jason [name]  MRN: [id number] DOB: 1956/03/12  Reason for Encounter: Disease State/ General Assessment Call.   Conditions to be addressed/monitored: HTN and DMII   Recent office visits:  08/03/21 [name] NP (PCP) - seen for Mollaret's Meningitis and other issues. No medication changes. Will follow up regarding blood work.  Recent consult visits:  08/11/21 [name] MD (Infectious Disease) - seen for CSF pleocytosis. No medication changes. No need to follow up.  Hospital visits:  Medication Reconciliation was completed by comparing discharge summary, patient's EMR and Pharmacy list, and upon discussion with patient.  Admitted to the hospital on 07/20/21 due to Acute Encephalopathy. Discharge date was 07/28/21. Discharged from [facility]?Medications Started at [facility] Discharge:?? -started None.  Medication Changes at Hospital Discharge: -Changed None.  Medications Discontinued at Hospital Discharge: -Stopped Ibuprofen.  Medications that remain the same after Hospital Discharge:??  -All other medications will remain the same.    Medications: Outpatient Encounter Medications as of 08/20/2021  Medication Sig   aspirin-acetaminophen-caffeine (EXCEDRIN MIGRAINE) 250-250-65 MG tablet Take 1 tablet by mouth every 6 (six) hours as needed for headache.   Blood Glucose Monitoring Suppl (ONE TOUCH ULTRA 2) w/Device KIT Use the device daily to check your blood sugars 1-4 times as instructed.   diltiazem (CARDIZEM CD) 120 MG 24 hr capsule TAKE 1 CAPSULE BY MOUTH EVERY DAY   glipiZIDE (GLUCOTROL XL) 10 MG 24 hr tablet Take 1 tablet (10 mg total) by mouth daily with breakfast.   glucose blood (ONE TOUCH ULTRA TEST) test strip USE TO TEST BLOOD GLUCOSE TWICE DAILY   JARDIANCE 25 MG TABS tablet TAKE 1 TABLET (25 MG TOTAL) BY MOUTH DAILY.   Lancets (ONETOUCH ULTRASOFT) lancets Use to check blood sugar BID    metFORMIN (GLUCOPHAGE XR) 500 MG 24 hr tablet Take 2 tablets (1,000 mg total) by mouth in the morning and at bedtime.   Multiple Vitamins-Minerals (CENTRUM SILVER PO) Take 1 tablet by mouth daily.   rosuvastatin (CRESTOR) 5 MG tablet Take 1 tablet (5 mg total) by mouth at bedtime.   vitamin B-12 (CYANOCOBALAMIN) 1000 MCG tablet Take 1 tablet (1,000 mcg total) by mouth daily.   No facility-administered encounter medications on file as of 08/20/2021.   Fill History: BUTALBITAL/ACETAMINOPHEN/CAFF TABS 07/09/2021 2   VITAMIN B-12 1,000 MCG TABLET 07/15/2021 30   JARDIANCE 25 MG TABLET 07/29/2021 30   ONETOUCH ULTRA TEST STRIP 07/17/2021 75   ONETOUCH DELICA 22Q LANCETS 33/35/4562 50   ROSUVASTATIN CALCIUM 5 MG TAB 07/02/2021 90   DILTIAZEM 24H ER(CD) 120 MG CP 07/02/2021 90   GLIPIZIDE ER 10 MG TABLET 08/06/2021 90   METFORMIN HCL ER 500 MG TABLET 04/06/2021 90   Recent Relevant Labs: Lab Results  Component Value Date/Time   HGBA1C 8.3 (H) 06/17/2021 08:31 AM   HGBA1C 7.2 (A) 03/04/2021 07:59 AM   HGBA1C 7.2 (A) 12/03/2020 08:45 AM   HGBA1C 7.8 (H) 05/28/2020 07:46 AM   HGBA1C 9.5 (A) 01/10/2019 08:30 AM   MICROALBUR 6.6 (H) 06/17/2021 08:31 AM   MICROALBUR 3.1 (H) 10/10/2018 08:30 AM    Kidney Function Lab Results  Component Value Date/Time   CREATININE 1.41 08/03/2021 04:30 PM   CREATININE 1.23 07/28/2021 09:16 AM   GFR 51.65 (L) 08/03/2021 04:30 PM   GFRNONAA >60 07/28/2021 09:16 AM   GFRAA >60 05/25/2011 04:49 AM    Current antihyperglycemic regimen:  Glipizide  72m - take 1 tablet daily by mouth with breakfast Jardiance 277m- take 1 tablet by mouth daily. Metformin 50043m take 2 tablets by mouth in the am and at bedtime. What recent interventions/DTPs have been made to improve glycemic control:  None. Have there been any recent hospitalizations or ED visits since last visit with CPP? Yes Patient denies hypoglycemic symptoms, including None Patient reports  hyperglycemic symptoms, including blurry vision How often are you checking your blood sugar? once daily What are your blood sugars ranging?  Fasting: 97,19,417,408hecked once an hour after he ate and it was 200.  During the week, how often does your blood glucose drop below 70? Never Are you checking your feet daily/regularly? Yes. Everyday.   Adherence Review: Is the patient currently on a STATIN medication? Yes Is the patient currently on ACE/ARB medication? No Does the patient have >5 day gap between last estimated fill dates? No   Reviewed chart prior to disease state call. Spoke with patient regarding BP  Recent Office Vitals: BP Readings from Last 3 Encounters:  08/11/21 131/85  08/03/21 140/82  07/28/21 131/79   Pulse Readings from Last 3 Encounters:  08/11/21 89  08/03/21 88  07/28/21 66    Wt Readings from Last 3 Encounters:  08/11/21 235 lb (106.6 kg)  08/03/21 235 lb (106.6 kg)  07/20/21 234 lb 9.1 oz (106.4 kg)     Kidney Function Lab Results  Component Value Date/Time   CREATININE 1.41 08/03/2021 04:30 PM   CREATININE 1.23 07/28/2021 09:16 AM   GFR 51.65 (L) 08/03/2021 04:30 PM   GFRNONAA >60 07/28/2021 09:16 AM   GFRAA >60 05/25/2011 04:49 AM    BMP Latest Ref Rng & Units 08/03/2021 07/28/2021 07/28/2021  Glucose 70 - 99 mg/dL 95 - 201(H)  BUN 6 - 23 mg/dL 17 - 19  Creatinine 0.40 - 1.50 mg/dL 1.41 1.23 1.46(H)  Sodium 135 - 145 mEq/L 141 - 138  Potassium 3.5 - 5.1 mEq/L 5.0 - 3.9  Chloride 96 - 112 mEq/L 106 - 104  CO2 19 - 32 mEq/L 27 - 28  Calcium 8.4 - 10.5 mg/dL 10.6(H) - 9.4    Current antihypertensive regimen:  Diltiazem 120m48mtake 1 capsule by mouth every day How often are you checking your Blood Pressure? 1-2x per week Current home BP readings: 154/90, 150/82, 148/80. What recent interventions/DTPs have been made by any provider to improve Blood Pressure control since last CPP Visit: None. Any recent hospitalizations or ED visits since  last visit with CPP? Yes  Adherence Review: Is the patient currently on ACE/ARB medication? No Does the patient have >5 day gap between last estimated fill dates? No  Notes: Spoke with patient and reviewed all medications as prescribed. Patient reports taking all medications and no issues at this time. Patient stated that he has not had a headache in over 3 days now since being treated for the encephalopathy and meningitis. Patient states his headaches were coming from those conditions and not his blood pressure or blood sugar. Patient stated he has red meat at least once a week and he does add a little salt to taste omn his food. Patient has things like sausage, waffles and eggs with coffee for breakfast. For lunch he tends to have leftovers from dinner the night before and he stated he tries to eat foods that are good for is sugars. Patient has 3 16 ounce bottles of water a day. Patient stated he walks daily and  does his yard work for activity. Patient thanked me for my call.   Care Gaps:  AWV - scheduled for 09/08/21 Zoster vaccines - overdue since 04/22/21 Covid - 19 vaccine booster 4 - overdue since 06/27/21 Flu vaccine - due  Star Rating Drugs:  Glipizide 24m - last filled on 08/06/21 [facility] Jardiance 255m- last filled on 07/29/21 30DS at [facility] Metformin 50058m last [facility]/06/21 [facility]  Rosuvastatin 5mg11mlast filled [facility] 07/02/21 [facility] Grovermacist Assistant ([phone]
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
## 2021-09-03 ENCOUNTER — Other Ambulatory Visit: Payer: Self-pay

## 2021-09-03 ENCOUNTER — Other Ambulatory Visit: Payer: Self-pay | Admitting: Adult Health

## 2021-09-03 ENCOUNTER — Ambulatory Visit (INDEPENDENT_AMBULATORY_CARE_PROVIDER_SITE_OTHER): Payer: Medicare Other | Admitting: Pulmonary Disease

## 2021-09-03 ENCOUNTER — Encounter: Payer: Self-pay | Admitting: Pulmonary Disease

## 2021-09-03 VITALS — BP 122/78 | HR 70 | Temp 97.3°F | Ht 75.0 in | Wt 233.4 lb

## 2021-09-03 DIAGNOSIS — G4710 Hypersomnia, unspecified: Secondary | ICD-10-CM | POA: Diagnosis not present

## 2021-09-03 DIAGNOSIS — E1169 Type 2 diabetes mellitus with other specified complication: Secondary | ICD-10-CM

## 2021-09-03 NOTE — Progress Notes (Signed)
Jason [name]    [id number]    27-Jun-1956  Primary Care Physician:Nafziger, Tommi Rumps, NP  Referring Physician: [name], NP [address],  [place] [address]  Chief complaint:   Patient being seen for daytime sleepiness Headaches in the mornings  HPI: Was referred for complaints of headaches in the mornings  Admits to snoring, no witnessed apneas No dryness of his mouth in the mornings No sore throat in the morning Occasional night sweats  Both parents snored He has a history of hypertension that is well controlled  Bedtime is about 10:00 Takes him about 30 minutes to fall asleep Wakes up about twice Final wake up time about 6:00  He has managed to lose about 40 pounds intentionally recently  Headaches occur frequently enough to be concerning  Outpatient Encounter Medications as of 09/03/2021  Medication Sig   aspirin-acetaminophen-caffeine (EXCEDRIN MIGRAINE) 250-250-65 MG tablet Take 1 tablet by mouth every 6 (six) hours as needed for headache.   Blood Glucose Monitoring Suppl (ONE TOUCH ULTRA 2) w/Device KIT Use the device daily to check your blood sugars 1-4 times as instructed.   diltiazem (CARDIZEM CD) 120 MG 24 hr capsule TAKE 1 CAPSULE BY MOUTH EVERY DAY   glipiZIDE (GLUCOTROL XL) 10 MG 24 hr tablet Take 1 tablet (10 mg total) by mouth daily with breakfast.   glucose blood (ONE TOUCH ULTRA TEST) test strip USE TO TEST BLOOD GLUCOSE TWICE DAILY   JARDIANCE 25 MG TABS tablet TAKE 1 TABLET (25 MG TOTAL) BY MOUTH DAILY.   Lancets (ONETOUCH ULTRASOFT) lancets Use to check blood sugar BID   metFORMIN (GLUCOPHAGE XR) 500 MG 24 hr tablet Take 2 tablets (1,000 mg total) by mouth in the morning and at bedtime.   Multiple Vitamins-Minerals (CENTRUM SILVER PO) Take 1 tablet by mouth daily.   rosuvastatin (CRESTOR) 5 MG tablet Take 1 tablet (5 mg total) by mouth at bedtime.   vitamin B-12 (CYANOCOBALAMIN) 1000 MCG tablet Take 1 tablet (1,000 mcg  total) by mouth daily.   No facility-administered encounter medications on file as of 09/03/2021.    Allergies as of 09/03/2021 - Review Complete 09/03/2021  Allergen Reaction Noted   Lisinopril  07/04/2011   Reglan [metoclopramide] Anxiety 07/09/2021    Past Medical History:  Diagnosis Date   Bell palsy    Diabetes mellitus without complication (HCC)    HTN (hypertension)     Past Surgical History:  Procedure Laterality Date   COLONOSCOPY W/ POLYPECTOMY      Family History  Problem Relation Age of Onset   Stroke Mother    Colon cancer Sister    High blood pressure Sister    Colon cancer Brother    High blood pressure Brother    Stomach cancer Brother    Lymphoma Son    Colon cancer Other        1st degree relative <60   Diabetes Other        1st degree relative    Stroke Other    Esophageal cancer Neg Hx    Rectal cancer Neg Hx     Social History   Socioeconomic History   Marital status: Married    Spouse name: Not on file   Number of children: 2   Years of education: 12   Highest education level: Not on file  Occupational History   Occupation: transportation    Comment: Publishing rights manager  Tobacco Use  Smoking status: Never   Smokeless tobacco: Never  Vaping Use   Vaping Use: Never used  Substance and Sexual Activity   Alcohol use: Yes    Alcohol/week: 6.0 standard drinks    Types: 6 Cans of beer per week    Comment: on weekends   Drug use: No   Sexual activity: Not on file  Other Topics Concern   Not on file  Social History Narrative      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       Fun: fix cars, drag race    Social Determinants of Radio broadcast assistant Strain: Low Risk    Difficulty of Paying Living Expenses: Not hard at all  Food Insecurity: No Food Insecurity   Worried About Charity fundraiser in the Last Year: Never true   Arboriculturist in the Last Year: Never true   Transportation Needs: No Transportation Needs   Lack of Transportation (Medical): No   Lack of Transportation (Non-Medical): No  Physical Activity: Insufficiently Active   Days of Exercise per Week: 3 days   Minutes of Exercise per Session: 30 min  Stress: No Stress Concern Present   Feeling of Stress : Not at all  Social Connections: Moderately Isolated   Frequency of Communication with Friends and Family: More than three times a week   Frequency of Social Gatherings with Friends and Family: More than three times a week   Attends Religious Services: More than 4 times per year   Active Member of [employer] or Organizations: No   Attends Archivist Meetings: Never   Marital Status: Widowed  Human resources officer Violence: Not At Risk   Fear of Current or Ex-Partner: No   Emotionally Abused: No   Physically Abused: No   Sexually Abused: No    Review of Systems  Constitutional:  Negative for fatigue.  Neurological:        Headaches  Psychiatric/Behavioral:  Positive for sleep disturbance.    Vitals:   09/03/21 1047  BP: 122/78  Pulse: 70  Temp: (!) 97.3 F (36.3 C)  SpO2: 100%     Physical Exam Constitutional:      Appearance: Normal appearance.  HENT:     Head: Normocephalic.     Nose: Nose normal. No congestion.     Mouth/Throat:     Mouth: Mucous membranes are moist.     Comments: Mallampati 3, crowded oropharynx, macroglossia Cardiovascular:     Rate and Rhythm: Normal rate and regular rhythm.     Heart sounds: No murmur heard.   No friction rub.  Pulmonary:     Effort: No respiratory distress.     Breath sounds: No stridor. No wheezing or rhonchi.  Musculoskeletal:     Cervical back: No rigidity or tenderness.  Neurological:     Mental Status: He is alert.  Psychiatric:        Mood and Affect: Mood normal.   Epworth Sleepiness Scale of 12  Data Reviewed: No previous sleep study on record  Assessment:  Mild to moderate probability of significant  obstructive sleep apnea  Denies significant symptoms, no witnessed apneas, does not feel he has significant problems with his sleep  No other specific explanation for his headaches  Pathophysiology of sleep disordered breathing discussed with the patient Treatment options discussed with the patient  Plan/Recommendations: Schedule patient for an in lab polysomnogram -  Likelihood of sleep disordered breathing especially with significant weight loss is mild to moderate  Does have significant headaches in the mornings and oral exam suggest he may have sleep disordered breathing, his excessive daytime sleepiness may also be a symptom of his poor sleep quality  Tentative follow-up in 3 to 4 months  Encouraged to call with any significant concerns   [name] MD [facility] 09/03/2021, 11:05 AM  CC: [name], NP

## 2021-09-03 NOTE — Patient Instructions (Signed)
There is a chance of obstructive sleep apnea with headaches in the night in the mornings  Best way to figure out is an in lab polysomnogram  We will schedule you for a sleep study  Apprise you with results as soon as reviewed  Tentative follow-up in 3 to 4 months  Sleep Apnea Sleep apnea affects breathing during sleep. It causes breathing to stop for 10 seconds or more, or to become shallow. People with sleep apnea usually snore loudly. It can also increase the risk of: Heart attack. Stroke. Being very overweight (obese). Diabetes. Heart failure. Irregular heartbeat. High blood pressure. The goal of treatment is to help you breathe normally again. What are the causes? The most common cause of this condition is a collapsed or blocked airway. There are three kinds of sleep apnea: Obstructive sleep apnea. This is caused by a blocked or collapsed airway. Central sleep apnea. This happens when the brain does not send the right signals to the muscles that control breathing. Mixed sleep apnea. This is a combination of obstructive and central sleep apnea. What increases the risk? Being overweight. Smoking. Having a small airway. Being older. Being male. Drinking alcohol. Taking medicines to calm yourself (sedatives or tranquilizers). Having family members with the condition. Having a tongue or tonsils that are larger than normal. What are the signs or symptoms? Trouble staying asleep. Loud snoring. Headaches in the morning. Waking up gasping. Dry mouth or sore throat in the morning. Being sleepy or tired during the day. If you are sleepy or tired during the day, you may also: Not be able to focus your mind (concentrate). Forget things. Get angry a lot and have mood swings. Feel sad (depressed). Have changes in your personality. Have less interest in sex, if you are male. Be unable to have an erection, if you are male. How is this treated?  Sleeping on your side. Using  a medicine to get rid of mucus in your nose (decongestant). Avoiding the use of alcohol, medicines to help you relax, or certain pain medicines (narcotics). Losing weight, if needed. Changing your diet. Quitting smoking. Using a machine to open your airway while you sleep, such as: An oral appliance. This is a mouthpiece that shifts your lower jaw forward. A CPAP device. This device blows air through a mask when you breathe out (exhale). An EPAP device. This has valves that you put in each nostril. A BPAP device. This device blows air through a mask when you breathe in (inhale) and breathe out. Having surgery if other treatments do not work. Follow these instructions at home: Lifestyle Make changes that your doctor recommends. Eat a healthy diet. Lose weight if needed. Avoid alcohol, medicines to help you relax, and some pain medicines. Do not smoke or use any products that contain nicotine or tobacco. If you need help quitting, ask your doctor. General instructions Take over-the-counter and prescription medicines only as told by your doctor. If you were given a machine to use while you sleep, use it only as told by your doctor. If you are having surgery, make sure to tell your doctor you have sleep apnea. You may need to bring your device with you. Keep all follow-up visits. Contact a doctor if: The machine that you were given to use during sleep bothers you or does not seem to be working. You do not get better. You get worse. Get help right away if: Your chest hurts. You have trouble breathing in enough air. You have an  uncomfortable feeling in your back, arms, or stomach. You have trouble talking. One side of your body feels weak. A part of your face is hanging down. These symptoms may be an emergency. Get help right away. Call your local emergency services (911 in the [place] Do not wait to see if the symptoms will go away. Do not drive yourself to the hospital. Summary This  condition affects breathing during sleep. The most common cause is a collapsed or blocked airway. The goal of treatment is to help you breathe normally while you sleep. This information is not intended to replace advice given to you by your health care provider. Make sure you discuss any questions you have with your health care provider. Document Revised: 11/06/2020 Document Reviewed: 11/06/2020 Elsevier Patient Education  2022 [employer].

## 2021-09-06 ENCOUNTER — Encounter: Payer: Self-pay | Admitting: Psychiatry

## 2021-09-06 ENCOUNTER — Ambulatory Visit: Payer: Medicare Other | Admitting: Psychiatry

## 2021-09-06 VITALS — BP 148/90 | HR 78 | Ht 75.0 in | Wt 246.8 lb

## 2021-09-06 DIAGNOSIS — A879 Viral meningitis, unspecified: Secondary | ICD-10-CM

## 2021-09-06 DIAGNOSIS — G43009 Migraine without aura, not intractable, without status migrainosus: Secondary | ICD-10-CM

## 2021-09-06 DIAGNOSIS — G44219 Episodic tension-type headache, not intractable: Secondary | ICD-10-CM | POA: Diagnosis not present

## 2021-09-06 NOTE — Patient Instructions (Addendum)
Continue to take Excedrin or Aleve as needed for headaches Follow up in 3 months

## 2021-09-08 ENCOUNTER — Telehealth: Payer: Self-pay | Admitting: Pharmacist

## 2021-09-08 ENCOUNTER — Ambulatory Visit (INDEPENDENT_AMBULATORY_CARE_PROVIDER_SITE_OTHER): Payer: Medicare Other

## 2021-09-08 DIAGNOSIS — Z0000 Encounter for general adult medical examination without abnormal findings: Secondary | ICD-10-CM | POA: Diagnosis not present

## 2021-09-08 NOTE — Patient Instructions (Signed)
Jason [name] , Thank you for taking time to come for your Medicare Wellness Visit. I appreciate your ongoing commitment to your health goals. Please review the following plan we discussed and let me know if I can assist you in the future.   Screening recommendations/referrals: Colonoscopy: 05/29/2021 Recommended yearly ophthalmology/optometry visit for glaucoma screening and checkup Recommended yearly dental visit for hygiene and checkup  Vaccinations: Influenza vaccine: completed  Pneumococcal vaccine: completed series  Tdap vaccine: 02/02/2016 Shingles vaccine: completed series     Advanced directives: none   Conditions/risks identified: none   Next appointment: 10/22/2021  300pm  [name]   Preventive Care 66 Years and Older, Male Preventive care refers to lifestyle choices and visits with your health care provider that can promote health and wellness. What does preventive care include? A yearly physical exam. This is also called an annual well check. Dental exams once or twice a year. Routine eye exams. Ask your health care provider how often you should have your eyes checked. Personal lifestyle choices, including: Daily care of your teeth and gums. Regular physical activity. Eating a healthy diet. Avoiding tobacco and drug use. Limiting alcohol use. Practicing safe sex. Taking low doses of aspirin every day. Taking vitamin and mineral supplements as recommended by your health care provider. What happens during an annual well check? The services and screenings done by your health care provider during your annual well check will depend on your age, overall health, lifestyle risk factors, and family history of disease. Counseling  Your health care provider may ask you questions about your: Alcohol use. Tobacco use. Drug use. Emotional well-being. Home and relationship well-being. Sexual activity. Eating habits. History of falls. Memory and ability to understand  (cognition). Work and work Statistician. Screening  You may have the following tests or measurements: Height, weight, and BMI. Blood pressure. Lipid and cholesterol levels. These may be checked every 5 years, or more frequently if you are over 19 years old. Skin check. Lung cancer screening. You may have this screening every year starting at age 21 if you have a 30-pack-year history of smoking and currently smoke or have quit within the past 15 years. Fecal occult blood test (FOBT) of the stool. You may have this test every year starting at age 47. Flexible sigmoidoscopy or colonoscopy. You may have a sigmoidoscopy every 5 years or a colonoscopy every 10 years starting at age 73. Prostate cancer screening. Recommendations will vary depending on your family history and other risks. Hepatitis C blood test. Hepatitis B blood test. Sexually transmitted disease (STD) testing. Diabetes screening. This is done by checking your blood sugar (glucose) after you have not eaten for a while (fasting). You may have this done every 1-3 years. Abdominal aortic aneurysm (AAA) screening. You may need this if you are a current or former smoker. Osteoporosis. You may be screened starting at age 23 if you are at high risk. Talk with your health care provider about your test results, treatment options, and if necessary, the need for more tests. Vaccines  Your health care provider may recommend certain vaccines, such as: Influenza vaccine. This is recommended every year. Tetanus, diphtheria, and acellular pertussis (Tdap, Td) vaccine. You may need a Td booster every 10 years. Zoster vaccine. You may need this after age 19. Pneumococcal 13-valent conjugate (PCV13) vaccine. One dose is recommended after age 110. Pneumococcal polysaccharide (PPSV23) vaccine. One dose is recommended after age 15. Talk to your health care provider about which screenings and vaccines  you need and how often you need them. This  information is not intended to replace advice given to you by your health care provider. Make sure you discuss any questions you have with your health care provider. Document Released: 12/25/2015 Document Revised: 08/17/2016 Document Reviewed: 09/29/2015 Elsevier Interactive Patient Education  2017 [place] Prevention in the Home Falls can cause injuries. They can happen to people of all ages. There are many things you can do to make your home safe and to help prevent falls. What can I do on the outside of my home? Regularly fix the edges of walkways and driveways and fix any cracks. Remove anything that might make you trip as you walk through a door, such as a raised step or threshold. Trim any bushes or trees on the path to your home. Use bright outdoor lighting. Clear any walking paths of anything that might make someone trip, such as rocks or tools. Regularly check to see if handrails are loose or broken. Make sure that both sides of any steps have handrails. Any raised decks and porches should have guardrails on the edges. Have any leaves, snow, or ice cleared regularly. Use sand or salt on walking paths during winter. Clean up any spills in your garage right away. This includes oil or grease spills. What can I do in the bathroom? Use night lights. Install grab bars by the toilet and in the tub and shower. Do not use towel bars as grab bars. Use non-skid mats or decals in the tub or shower. If you need to sit down in the shower, use a plastic, non-slip stool. Keep the floor dry. Clean up any water that spills on the floor as soon as it happens. Remove soap buildup in the tub or shower regularly. Attach bath mats securely with double-sided non-slip rug tape. Do not have throw rugs and other things on the floor that can make you trip. What can I do in the bedroom? Use night lights. Make sure that you have a light by your bed that is easy to reach. Do not use any sheets or  blankets that are too big for your bed. They should not hang down onto the floor. Have a firm chair that has side arms. You can use this for support while you get dressed. Do not have throw rugs and other things on the floor that can make you trip. What can I do in the kitchen? Clean up any spills right away. Avoid walking on wet floors. Keep items that you use a lot in easy-to-reach places. If you need to reach something above you, use a strong step stool that has a grab bar. Keep electrical cords out of the way. Do not use floor polish or wax that makes floors slippery. If you must use wax, use non-skid floor wax. Do not have throw rugs and other things on the floor that can make you trip. What can I do with my stairs? Do not leave any items on the stairs. Make sure that there are handrails on both sides of the stairs and use them. Fix handrails that are broken or loose. Make sure that handrails are as long as the stairways. Check any carpeting to make sure that it is firmly attached to the stairs. Fix any carpet that is loose or worn. Avoid having throw rugs at the top or bottom of the stairs. If you do have throw rugs, attach them to the floor with carpet tape. Make sure  that you have a light switch at the top of the stairs and the bottom of the stairs. If you do not have them, ask someone to add them for you. What else can I do to help prevent falls? Wear shoes that: Do not have high heels. Have rubber bottoms. Are comfortable and fit you well. Are closed at the toe. Do not wear sandals. If you use a stepladder: Make sure that it is fully opened. Do not climb a closed stepladder. Make sure that both sides of the stepladder are locked into place. Ask someone to hold it for you, if possible. Clearly mark and make sure that you can see: Any grab bars or handrails. First and last steps. Where the edge of each step is. Use tools that help you move around (mobility aids) if they are  needed. These include: Canes. Walkers. Scooters. Crutches. Turn on the lights when you go into a dark area. Replace any light bulbs as soon as they burn out. Set up your furniture so you have a clear path. Avoid moving your furniture around. If any of your floors are uneven, fix them. If there are any pets around you, be aware of where they are. Review your medicines with your doctor. Some medicines can make you feel dizzy. This can increase your chance of falling. Ask your doctor what other things that you can do to help prevent falls. This information is not intended to replace advice given to you by your health care provider. Make sure you discuss any questions you have with your health care provider. Document Released: 09/24/2009 Document Revised: 05/05/2016 Document Reviewed: 01/02/2015 Elsevier Interactive Patient Education  2017 [employer].

## 2021-09-08 NOTE — Chronic Care Management (AMB) (Signed)
    Chronic Care Management Pharmacy Assistant   Name: Jason [name]  MRN: [id number] DOB: [birth year]  Spoke with patient and rescheduled his 10/14/21 telephone appointment with [name] the clinical pharmacist per [name] request. Patient was aware and agreeable to his new appointment date and time. Patient thanked me for my call.    Medications: Outpatient Encounter Medications as of 09/08/2021  Medication Sig   aspirin-acetaminophen-caffeine (EXCEDRIN MIGRAINE) 250-250-65 MG tablet Take 1 tablet by mouth every 6 (six) hours as needed for headache.   Blood Glucose Monitoring Suppl (ONE TOUCH ULTRA 2) w/Device KIT Use the device daily to check your blood sugars 1-4 times as instructed.   diltiazem (CARDIZEM CD) 120 MG 24 hr capsule TAKE 1 CAPSULE BY MOUTH EVERY DAY   glipiZIDE (GLUCOTROL XL) 10 MG 24 hr tablet Take 1 tablet (10 mg total) by mouth daily with breakfast.   glucose blood (ONE TOUCH ULTRA TEST) test strip USE TO TEST BLOOD GLUCOSE TWICE DAILY   JARDIANCE 25 MG TABS tablet TAKE 1 TABLET (25 MG TOTAL) BY MOUTH DAILY.   Lancets (ONETOUCH ULTRASOFT) lancets Use to check blood sugar BID   metFORMIN (GLUCOPHAGE XR) 500 MG 24 hr tablet Take 2 tablets (1,000 mg total) by mouth in the morning and at bedtime.   Multiple Vitamins-Minerals (CENTRUM SILVER PO) Take 1 tablet by mouth daily.   rosuvastatin (CRESTOR) 5 MG tablet Take 1 tablet (5 mg total) by mouth at bedtime.   vitamin B-12 (CYANOCOBALAMIN) 1000 MCG tablet Take 1 tablet (1,000 mcg total) by mouth daily.   No facility-administered encounter medications on file as of 09/08/2021.    Care Gaps:  AWV - scheduled for today Zoster vaccines - overdue since 04/22/21 Covid-19 vaccine booster 4 -overdue since 7/22 Flu vaccine - due  Star Rating Drugs:  Glipizide $RemoveBe'10mg'[id number]$  - last filled on 08/06/21 90DS at [facility] Jardiance $RemoveBefor'25mg'[id number]$  - last filled on 07/29/21 30DS at [facility] Rosuvastatin $RemoveBeforeDE'5mg'[id number]$  - last filled on 07/02/21 90DS at  [facility] Metformin $RemoveBefor'500mg'[id number]$  - last filled on 04/06/21 90DS at [facility] Pharmacist Assistant [phone]

## 2021-09-08 NOTE — Progress Notes (Signed)
Subjective:   Jason [name] is a 65 y.o. male who presents for an Subsequent Medicare Annual Wellness Visit.   I connected with Jason [name] today by telephone and verified that I am speaking with the correct person using two identifiers. Location patient: home Location provider: work Persons participating in the virtual visit: patient, provider.   I discussed the limitations, risks, security and privacy concerns of performing an evaluation and management service by telephone and the availability of in person appointments. I also discussed with the patient that there may be a patient responsible charge related to this service. The patient expressed understanding and verbally consented to this telephonic visit.    Interactive audio and video telecommunications were attempted between this provider and patient, however failed, due to patient having technical difficulties OR patient did not have access to video capability.  We continued and completed visit with audio only.    Review of Systems    N/a Cardiac Risk Factors include: advanced age (>75mn, >>26women);diabetes mellitus;dyslipidemia;hypertension;male gender     Objective:    Today's Vitals   There is no height or weight on file to calculate BMI.  Advanced Directives 09/08/2021 07/26/2021 07/21/2021 07/10/2021 09/07/2020 03/28/2016 03/14/2016  Does Patient Have a Medical Advance Directive? No No No No No No No  Would patient like information on creating a medical advance directive? No - Patient declined No - Guardian declined No - Patient declined No - Patient declined No - Patient declined No - patient declined information No - patient declined information    Current Medications (verified) Outpatient Encounter Medications as of 09/08/2021  Medication Sig   aspirin-acetaminophen-caffeine (EXCEDRIN MIGRAINE) 250-250-65 MG tablet Take 1 tablet by mouth every 6 (six) hours as needed for headache.   Blood Glucose Monitoring  Suppl (ONE TOUCH ULTRA 2) w/Device KIT Use the device daily to check your blood sugars 1-4 times as instructed.   diltiazem (CARDIZEM CD) 120 MG 24 hr capsule TAKE 1 CAPSULE BY MOUTH EVERY DAY   glipiZIDE (GLUCOTROL XL) 10 MG 24 hr tablet Take 1 tablet (10 mg total) by mouth daily with breakfast.   glucose blood (ONE TOUCH ULTRA TEST) test strip USE TO TEST BLOOD GLUCOSE TWICE DAILY   JARDIANCE 25 MG TABS tablet TAKE 1 TABLET (25 MG TOTAL) BY MOUTH DAILY.   Lancets (ONETOUCH ULTRASOFT) lancets Use to check blood sugar BID   metFORMIN (GLUCOPHAGE XR) 500 MG 24 hr tablet Take 2 tablets (1,000 mg total) by mouth in the morning and at bedtime.   Multiple Vitamins-Minerals (CENTRUM SILVER PO) Take 1 tablet by mouth daily.   rosuvastatin (CRESTOR) 5 MG tablet Take 1 tablet (5 mg total) by mouth at bedtime.   vitamin B-12 (CYANOCOBALAMIN) 1000 MCG tablet Take 1 tablet (1,000 mcg total) by mouth daily.   No facility-administered encounter medications on file as of 09/08/2021.    Allergies (verified) Lisinopril and Reglan [metoclopramide]   History: Past Medical History:  Diagnosis Date   Bell palsy    Diabetes mellitus without complication (HCC)    HTN (hypertension)    Past Surgical History:  Procedure Laterality Date   COLONOSCOPY W/ POLYPECTOMY     Family History  Problem Relation Age of Onset   Stroke Mother    Colon cancer Sister    High blood pressure Sister    Colon cancer Brother    High blood pressure Brother    Stomach cancer Brother    Lymphoma Son    Colon cancer  Other        1st degree relative <60   Diabetes Other        1st degree relative    Stroke Other    Esophageal cancer Neg Hx    Rectal cancer Neg Hx    Social History   Socioeconomic History   Marital status: Widowed    Spouse name: Not on file   Number of children: 2   Years of education: 12   Highest education level: Not on file  Occupational History   Occupation: transportation    Comment: Insurance underwriter  Tobacco Use   Smoking status: Never   Smokeless tobacco: Never  Vaping Use   Vaping Use: Never used  Substance and Sexual Activity   Alcohol use: Yes    Alcohol/week: 6.0 standard drinks    Types: 6 Cans of beer per week    Comment: on weekends   Drug use: No   Sexual activity: Not on file  Other Topics Concern   Not on file  Social History Narrative      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       Fun: fix cars, drag race    Social Determinants of Health   Financial Resource Strain: Low Risk    Difficulty of Paying Living Expenses: Not hard at all  Food Insecurity: Not on file  Transportation Needs: No Transportation Needs   Lack of Transportation (Medical): No   Lack of Transportation (Non-Medical): No  Physical Activity: Sufficiently Active   Days of Exercise per Week: 5 days   Minutes of Exercise per Session: 50 min  Stress: Not on file  Social Connections: Moderately Isolated   Frequency of Communication with Friends and Family: Three times a week   Frequency of Social Gatherings with Friends and Family: Three times a week   Attends Religious Services: More than 4 times per year   Active Member of Clubs or Organizations: No   Attends Archivist Meetings: Never   Marital Status: Widowed    Tobacco Counseling Counseling given: Not Answered   Clinical Intake:  Pre-visit preparation completed: Yes  Pain : No/denies pain     Nutritional Risks: None Diabetes: Yes CBG done?: No Did pt. bring in CBG monitor from home?: No  How often do you need to have someone help you when you read instructions, pamphlets, or other written materials from your doctor or pharmacy?: 1 - Never What is the last grade level you completed in school?: High School  Diabetic?yes  Nutrition Risk Assessment:  Has the patient had any N/V/D within the last 2 months?  No  Does the patient have any non-healing wounds?  No   Has the patient had any unintentional weight loss or weight gain?  No   Diabetes:  Is the patient diabetic?  Yes  If diabetic, was a CBG obtained today?  No  Did the patient bring in their glucometer from home?  No  How often do you monitor your CBG's? Daily .   Financial Strains and Diabetes Management:  Are you having any financial strains with the device, your supplies or your medication? No .  Does the patient want to be seen by Chronic Care Management for management of their diabetes?  No  Would the patient like to be referred to a Nutritionist or for Diabetic Management?  No   Diabetic Exams:  Diabetic Eye Exam: Completed 10/2020 Diabetic  Foot Exam: Overdue, Pt has been advised about the importance in completing this exam. Pt is scheduled for diabetic foot exam on next office visit .   Interpreter Needed?: No  Information entered by :: [place] of Daily Living In your present state of health, do you have any difficulty performing the following activities: 09/08/2021 07/26/2021  Hearing? N N  Vision? N N  Difficulty concentrating or making decisions? N N  Walking or climbing stairs? N N  Dressing or bathing? N N  Doing errands, shopping? N N  Preparing Food and eating ? N -  Using the Toilet? N -  In the past six months, have you accidently leaked urine? N -  Do you have problems with loss of bowel control? N -  Managing your Medications? N -  Managing your Finances? N -  Some recent data might be hidden    Patient Care Team: [name], NP as PCP - General (Family Medicine) [name], [facility] as Pharmacist (Pharmacist)  Indicate any recent Medical Services you may have received from other than [facility] providers in the past year (date may be approximate).     Assessment:   This is a routine wellness examination for [facility].  Hearing/Vision screen Vision Screening - Comments:: Annual eye exams wear glasses   Dietary issues and exercise  activities discussed: Current Exercise Habits: Home exercise routine, Type of exercise: walking, Time (Minutes): 30, Frequency (Times/Week): 5, Weekly Exercise (Minutes/Week): 150, Intensity: Mild, Exercise limited by: None identified   Goals Addressed             This Visit's Progress    Patient Stated   On track    I will continue to exercise at least 3 days per week       Depression Screen PHQ 2/9 Scores 09/08/2021 09/08/2021 09/07/2020 09/05/2019 02/02/2016  PHQ - 2 Score 0 0 0 0 0  PHQ- 9 Score - - 0 - -    Fall Risk Fall Risk  09/08/2021 09/07/2020 09/05/2019 02/02/2016  Falls in the past year? 0 0 0 No  Number falls in past yr: 0 0 0 -  Injury with Fall? 0 0 0 -  Risk for fall due to : - Medication side effect - -  Follow up Falls evaluation completed Falls evaluation completed;Falls prevention discussed - -    FALL RISK PREVENTION PERTAINING TO THE HOME:  Any stairs in or around the home? No  If so, are there any without handrails? No  Home free of loose throw rugs in walkways, pet beds, electrical cords, etc? Yes  Adequate lighting in your home to reduce risk of falls? Yes   ASSISTIVE DEVICES UTILIZED TO PREVENT FALLS:  Life alert? No  Use of a cane, walker or w/c? No  Grab bars in the bathroom? No  Shower chair or bench in shower? Yes  Elevated toilet seat or a handicapped toilet? Yes    Cognitive Function:  Normal cognitive status assessed by direct observation by this Nurse Health Advisor. No abnormalities found.        Immunizations Immunization History  Administered Date(s) Administered   Fluad Quad(high Dose 65+) 09/11/2019, 09/03/2020   Influenza Split 09/12/2012   Influenza Whole 12/24/2007   Influenza,inj,Quad PF,6+ Mos 02/02/2016, 10/10/2018   PFIZER Comirnaty(Gray Top)Covid-19 Tri-Sucrose Vaccine 02/25/2021   PFIZER(Purple Top)SARS-COV-2 Vaccination 02/24/2020, 03/16/2020, 02/25/2021   Pneumococcal Conjugate-13 09/11/2019   Pneumococcal  Polysaccharide-23 02/25/2016   Td 01/31/2001   Tdap 02/02/2016  Zoster Recombinat (Shingrix) 02/25/2021    TDAP status: Up to date  Flu Vaccine status: Up to date  Pneumococcal vaccine status: Up to date  Covid-19 vaccine status: Completed vaccines  Qualifies for Shingles Vaccine? Yes   Zostavax completed Yes   Shingrix Completed?: Yes  Screening Tests Health Maintenance  Topic Date Due   Zoster Vaccines- Shingrix (2 of 2) 04/22/2021   COVID-19 Vaccine (4 - Booster for Pfizer series) 06/27/2021   INFLUENZA VACCINE  07/12/2021   OPHTHALMOLOGY EXAM  10/12/2021   HEMOGLOBIN A1C  12/18/2021   COLONOSCOPY (Pts 45-60yr Insurance coverage will need to be confirmed)  05/29/2022   FOOT EXAM  06/17/2022   URINE MICROALBUMIN  06/17/2022   TETANUS/TDAP  02/01/2026   Hepatitis C Screening  Completed   HPV VACCINES  Aged Out    Health Maintenance  Health Maintenance Due  Topic Date Due   Zoster Vaccines- Shingrix (2 of 2) 04/22/2021   COVID-19 Vaccine (4 - Booster for Pfizer series) 06/27/2021   INFLUENZA VACCINE  07/12/2021    Colorectal cancer screening: Type of screening: Colonoscopy. Completed 05/30/2019. Repeat every 10 years  Lung Cancer Screening: (Low Dose CT Chest recommended if Age 67-80years, 30 pack-year currently smoking OR have quit w/in 15years.) does not qualify.   Lung Cancer Screening Referral: n/a  Additional Screening:  Hepatitis C Screening: does not qualify; Completed 02/02/2016  Vision Screening: Recommended annual ophthalmology exams for early detection of glaucoma and other disorders of the eye. Is the patient up to date with their annual eye exam?  Yes  Who is the provider or what is the name of the office in which the patient attends annual eye exams? Dr.Digby  If pt is not established with a provider, would they like to be referred to a provider to establish care? No .   Dental Screening: Recommended annual dental exams for proper oral  hygiene  Community Resource Referral / Chronic Care Management: CRR required this visit?  No   CCM required this visit?  No      Plan:     I have personally reviewed and noted the following in the patient's chart:   Medical and social history Use of alcohol, tobacco or illicit drugs  Current medications and supplements including opioid prescriptions. Patient is not currently taking opioid prescriptions. Functional ability and status Nutritional status Physical activity Advanced directives List of other physicians Hospitalizations, surgeries, and ER visits in previous 12 months Vitals Screenings to include cognitive, depression, and falls Referrals and appointments  In addition, I have reviewed and discussed with patient certain preventive protocols, quality metrics, and best practice recommendations. A written personalized care plan for preventive services as well as general preventive health recommendations were provided to patient.     LRandel Pigg LPN   90/27/2536  Nurse Notes: none

## 2021-10-06 ENCOUNTER — Ambulatory Visit: Payer: Medicare Other | Admitting: Dietician

## 2021-10-13 DIAGNOSIS — E119 Type 2 diabetes mellitus without complications: Secondary | ICD-10-CM | POA: Diagnosis not present

## 2021-10-13 DIAGNOSIS — H2513 Age-related nuclear cataract, bilateral: Secondary | ICD-10-CM | POA: Diagnosis not present

## 2021-10-13 DIAGNOSIS — H35033 Hypertensive retinopathy, bilateral: Secondary | ICD-10-CM | POA: Diagnosis not present

## 2021-10-14 ENCOUNTER — Telehealth: Payer: Medicare Other

## 2021-10-15 ENCOUNTER — Ambulatory Visit (HOSPITAL_BASED_OUTPATIENT_CLINIC_OR_DEPARTMENT_OTHER): Payer: Medicare Other | Attending: Pulmonary Disease | Admitting: Pulmonary Disease

## 2021-10-15 ENCOUNTER — Other Ambulatory Visit: Payer: Self-pay

## 2021-10-15 DIAGNOSIS — G4710 Hypersomnia, unspecified: Secondary | ICD-10-CM

## 2021-10-15 DIAGNOSIS — G4733 Obstructive sleep apnea (adult) (pediatric): Secondary | ICD-10-CM | POA: Diagnosis not present

## 2021-10-15 DIAGNOSIS — G4736 Sleep related hypoventilation in conditions classified elsewhere: Secondary | ICD-10-CM | POA: Insufficient documentation

## 2021-10-22 ENCOUNTER — Ambulatory Visit (INDEPENDENT_AMBULATORY_CARE_PROVIDER_SITE_OTHER): Payer: Medicare Other | Admitting: Adult Health

## 2021-10-22 ENCOUNTER — Encounter: Payer: Self-pay | Admitting: Adult Health

## 2021-10-22 VITALS — BP 110/72 | HR 78 | Temp 98.9°F | Ht 75.0 in | Wt 252.0 lb

## 2021-10-22 DIAGNOSIS — E1169 Type 2 diabetes mellitus with other specified complication: Secondary | ICD-10-CM | POA: Diagnosis not present

## 2021-10-22 LAB — POCT GLYCOSYLATED HEMOGLOBIN (HGB A1C): Hemoglobin A1C: 7.7 % — AB (ref 4.0–5.6)

## 2021-10-22 MED ORDER — GLIPIZIDE ER 10 MG PO TB24: 10.0000 mg | ORAL_TABLET | Freq: Two times a day (BID) | ORAL | 0 refills | Status: DC

## 2021-10-22 MED ORDER — METFORMIN HCL ER 500 MG PO TB24: 1000.0000 mg | ORAL_TABLET | Freq: Two times a day (BID) | ORAL | 0 refills | Status: DC

## 2021-10-22 NOTE — Progress Notes (Signed)
Subjective:    Patient ID: Jason [name], male    DOB: 08/09/1956, 65 y.o.   MRN: [id number]  HPI 65 year old male who  has a past medical history of Bell palsy, Diabetes mellitus without complication ([place]), and HTN (hypertension).  He presents to the office today for 52-monthfollow-up regarding diabetes and hypertension  Diabetes mellitus type 2-currently prescribed glipizide 10 mg extended release daily, metformin 1000 mg daily, and Jardiance 25 mg daily.  He does check his blood sugars periodically at home and reports readings between 130 and 140.  He denies any hypoglycemic events.  During his last visit in July 2022 his A1c had increased from 7.2-8.3. He is trying to eat right and is staying away from sweets. He is just getting back into exercising.   Lab Results  Component Value Date   HGBA1C 8.3 (H) 06/17/2021   Hypertension-takes Cardizem 180 mg daily.  Does not monitor his blood pressure at home.  Denies dizziness, lightheadedness, chest pain, shortness of breath BP Readings from Last 3 Encounters:  10/22/21 110/72  09/06/21 (!) 148/90  09/03/21 122/78    Review of Systems See HPI   Past Medical History:  Diagnosis Date   Bell palsy    Diabetes mellitus without [place] ([place]    HTN (hypertension)     Social History   Socioeconomic History   Marital status: Widowed    Spouse name: Not on file   Number of children: 2   Years of education: 12   Highest education level: Not on file  Occupational History   Occupation: transportation    Comment: CPublishing rights manager Tobacco Use   Smoking status: Never   Smokeless tobacco: Never  Vaping Use   Vaping Use: Never used  Substance and Sexual Activity   Alcohol use: Yes    Alcohol/week: 6.0 standard drinks    Types: 6 Cans of beer per week    Comment: on weekends   Drug use: No   Sexual activity: Not on file  Other Topics Concern   Not on file  Social History Narrative      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       Fun: fix cars, drag race    Social Determinants of Health   Financial Resource Strain: Low Risk    Difficulty of Paying Living Expenses: Not hard at all  Food Insecurity: Not on file  Transportation Needs: No Transportation Needs   Lack of Transportation (Medical): No   Lack of Transportation (Non-Medical): No  Physical Activity: Sufficiently Active   Days of Exercise per Week: 5 days   Minutes of Exercise per Session: 50 min  Stress: Not on file  Social Connections: Moderately Isolated   Frequency of Communication with Friends and Family: Three times a week   Frequency of Social Gatherings with Friends and Family: Three times a week   Attends Religious Services: More than 4 times per year   Active Member of Clubs or Organizations: No   Attends CArchivistMeetings: Never   Marital Status: Widowed  IHuman resources officerViolence: Not At Risk   Fear of Current or Ex-Partner: No   Emotionally Abused: No   Physically Abused: No   Sexually Abused: No    Past Surgical History:  Procedure Laterality Date   COLONOSCOPY W/ POLYPECTOMY      Family History  Problem Relation Age of Onset   Stroke  Mother    Colon cancer Sister    High blood pressure Sister    Colon cancer Brother    High blood pressure Brother    Stomach cancer Brother    Lymphoma Son    Colon cancer Other        1st degree relative <60   Diabetes Other        1st degree relative    Stroke Other    Esophageal cancer Neg Hx    Rectal cancer Neg Hx     Allergies  Allergen Reactions   Lisinopril     hyperkalemia   Reglan [Metoclopramide] Anxiety    Patient had bad reaction requiring ativan in addition to benadryl. He doesn't want to receive it any more    Current Outpatient Medications on File Prior to Visit  Medication Sig Dispense Refill   aspirin-acetaminophen-caffeine (EXCEDRIN MIGRAINE) 250-250-65 MG tablet Take 1 tablet by mouth every 6  (six) hours as needed for headache.     Blood Glucose Monitoring Suppl (ONE TOUCH ULTRA 2) w/Device KIT Use the device daily to check your blood sugars 1-4 times as instructed. 1 kit 0   diltiazem (CARDIZEM CD) 120 MG 24 hr capsule TAKE 1 CAPSULE BY MOUTH EVERY DAY 90 capsule 3   glipiZIDE (GLUCOTROL XL) 10 MG 24 hr tablet Take 1 tablet (10 mg total) by mouth daily with breakfast. 90 tablet 0   glucose blood (ONE TOUCH ULTRA TEST) test strip USE TO TEST BLOOD GLUCOSE TWICE DAILY 200 each 3   JARDIANCE 25 MG TABS tablet TAKE 1 TABLET (25 MG TOTAL) BY MOUTH DAILY. 30 tablet 2   Lancets (ONETOUCH ULTRASOFT) lancets Use to check blood sugar BID 100 each 12   Multiple Vitamins-Minerals (CENTRUM SILVER PO) Take 1 tablet by mouth daily.     rosuvastatin (CRESTOR) 5 MG tablet Take 1 tablet (5 mg total) by mouth at bedtime. 90 tablet 3   vitamin B-12 (CYANOCOBALAMIN) 1000 MCG tablet Take 1 tablet (1,000 mcg total) by mouth daily. 30 tablet 0   metFORMIN (GLUCOPHAGE XR) 500 MG 24 hr tablet Take 2 tablets (1,000 mg total) by mouth in the morning and at bedtime. 360 tablet 0   No current facility-administered medications on file prior to visit.    BP 110/72   Pulse 78   Temp 98.9 F (37.2 C) (Oral)   Ht '6\' 3"'  (1.905 m)   Wt 252 lb (114.3 kg)   SpO2 97%   BMI 31.50 kg/m       Objective:   Physical Exam Vitals and nursing note reviewed.  Constitutional:      Appearance: Normal appearance.  Cardiovascular:     Rate and Rhythm: Normal rate and regular rhythm.     Pulses: Normal pulses.     Heart sounds: Normal heart sounds.  Pulmonary:     Effort: Pulmonary effort is normal.     Breath sounds: Normal breath sounds.  Musculoskeletal:        General: Normal range of motion.  Skin:    General: Skin is warm and dry.     Capillary Refill: Capillary refill takes less than 2 seconds.  Neurological:     General: No focal deficit present.     Mental Status: He is alert and oriented to person,  place, and time.  Psychiatric:        Mood and Affect: Mood normal.        Behavior: Behavior normal.  Thought Content: Thought content normal.        Judgment: Judgment normal.      Assessment & Plan:  1. Type 2 diabetes mellitus with other specified complication, without long-term current use of insulin (HCC)  - POC HgB A1c- 7.7 - has improved.  - We discussed starting him on Trulicity. He refused at this time. Would like to try with the medication he currently has. Will increase glipizide to 10 mg BID and have him follow up in 3 months.  - Continue to work on lifestyle modifications  - glipiZIDE (GLUCOTROL XL) 10 MG 24 hr tablet; Take 1 tablet (10 mg total) by mouth 2 (two) times daily.  Dispense: 180 tablet; Refill: 0 - metFORMIN (GLUCOPHAGE XR) 500 MG 24 hr tablet; Take 2 tablets (1,000 mg total) by mouth in the morning and at bedtime.  Dispense: 360 tablet; Refill: 0   [name], NP

## 2021-10-26 ENCOUNTER — Telehealth: Payer: Self-pay | Admitting: Pulmonary Disease

## 2021-10-26 NOTE — Telephone Encounter (Signed)
I have called the pt and LM on VM for him to call back for results.

## 2021-10-26 NOTE — Telephone Encounter (Signed)
Call patient  Sleep study result  Date of study: 10/15/2021  Impression: Mild obstructive sleep apnea Mild oxygen desaturations  Recommendation:  Options of treatment for mild obstructive sleep apnea will include 1.  CPAP therapy may be considered if patient has significant daytime symptoms that can be ascribed to be an effect of sleep disordered breathing. If CPAP therapy is chosen as option of treatment, auto titrating CPAP with pressure settings of 5-15 will be appropriate 2.  An oral device may also be considered as an option of treatment for mild sleep disordered breathing 3.  Watchful waiting with focus on weight loss and sleep position modification may also be appropriate if not symptomatic.  Sleep position optimization by encouraging sleep in a lateral position, elevating the head of the bed by about 30 degrees may help Close clinical follow up with compliance monitoring to optimize therapeutic efficiency Weight loss measures encouraged He should be cautioned against driving when sleepy & against medications with sedative side effects  Offer follow-up to discuss options of treatment

## 2021-10-26 NOTE — Procedures (Signed)
POLYSOMNOGRAPHY  Last, First: Jason [name], Jason [name] MRN: [id number] Gender: Male Age (years): 65 Weight (lbs): 240 DOB: Oct 22, 1956 BMI: 30 Primary Care: No PCP Epworth Score: 7 Referring: Laurin Coder MD Technician: Jorge Ny Interpreting: Laurin Coder MD Study Type: NPSG Ordered Study Type: Split Night CPAP Study date: 10/15/2021 Location: [place] CLINICAL INFORMATION Jason [name] is a 65 year old Male and was referred to the sleep center for evaluation of G47.80 Other Sleep Disorders. Indications include Diabetes, Excessive Daytime Sleepiness, Hypertension, Obesity, Snoring.  MEDICATIONS Patient self administered medications include: N/A. Medications administered during study include No sleep medicine administered.  SLEEP STUDY TECHNIQUE A multi-channel overnight Polysomnography study was performed. The channels recorded and monitored were central and occipital EEG, electrooculogram (EOG), submentalis EMG (chin), nasal and oral airflow, thoracic and abdominal wall motion, anterior tibialis EMG, snore microphone, electrocardiogram, and a pulse oximetry. TECHNICIAN COMMENTS Comments added by Technician: Patient was restless all through the night. Comments added by Scorer: N/A SLEEP ARCHITECTURE The study was initiated at 10:06:23 PM and terminated at 5:16:01 AM. The total recorded time was 429.6 minutes. EEG confirmed total sleep time was 301.5 minutes yielding a sleep efficiency of 70.2%%. Sleep onset after lights out was 18.4 minutes with a REM latency of 160.0 minutes. The patient spent 7.5%% of the night in stage N1 sleep, 79.6%% in stage N2 sleep, 0.0%% in stage N3 and 12.9% in REM. Wake after sleep onset (WASO) was 109.7 minutes. The Arousal Index was 35.8/hour. RESPIRATORY PARAMETERS There were a total of 43 respiratory disturbances out of which 2 were apneas ( 2 obstructive, 0 mixed, 0 central) and 41 hypopneas. The apnea/hypopnea index (AHI) was 8.6  events/hour. The central sleep apnea index was 0 events/hour. The REM AHI was 32.3 events/hour and NREM AHI was 5.0 events/hour. The supine AHI was 8.4 events/hour and the non supine AHI was 8.8 supine during 63.85% of sleep. Respiratory disturbances were associated with oxygen desaturation down to a nadir of 72.0% during sleep. The mean oxygen saturation during the study was 95.0%. The cumulative time under 88% oxygen saturation was 5.5 minutes.  LEG MOVEMENT DATA The total leg movements were 236 with a resulting leg movement index of 47.0/hr .Associated arousal with leg movement index was 3.6/hr.  CARDIAC DATA The underlying cardiac rhythm was most consistent with sinus rhythm. Mean heart rate during sleep was 64.3 bpm. Additional rhythm abnormalities include None.  IMPRESSIONS - Mild Obstructive Sleep apnea(OSA) - Severe number of events noted during REM sleep - EKG showed no cardiac abnormalities. - Mild Oxygen Desaturation - The patient snored with moderate snoring volume. - No significant periodic leg movements(PLMs) during sleep. However, no significant associated arousals.  DIAGNOSIS - Obstructive Sleep Apnea (G47.33) - Nocturnal Hypoxemia (G47.36)  RECOMMENDATIONS - CPAP therapy may be considered if patient has significant daytime symptoms - Therapeutic CPAP titration to determine optimal pressure required to alleviate sleep disordered breathing. - An oral appliance may be considered as an option for the treatment of mild sleep disordered breathing - Watchful waiting with focus on weight loss and sleep position modification may also be appropriate if patient is not symptomatic - Avoid alcohol, sedatives and other CNS depressants that may worsen sleep apnea and disrupt normal sleep architecture. - Sleep hygiene should be reviewed to assess factors that may improve sleep quality. - Weight management and regular exercise should be initiated or continued.  [Electronically signed]  10/26/2021 05:16 AM  [name] MD NPI: [id number]

## 2021-11-14 IMAGING — MR MR HEAD W/O CM
5 series · 48 of 48 positions shown · non-contrast
Comparison: Same day CT head.  MRI July 09, 2021.

CLINICAL DATA: Neuro deficit, acute, stroke suspected

EXAM:
MRI HEAD WITHOUT CONTRAST
TECHNIQUE: Multiplanar, multiecho pulse sequences of the brain and surrounding
structures were obtained without intravenous contrast.

[Series 5: DWI · axial · 3.0mm · 0.92mm/px · z∈[-72,+79]mm · 17 of 104 slices shown (1 of 2)]
[im 1/104]
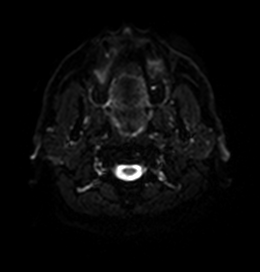
[im 7/104]
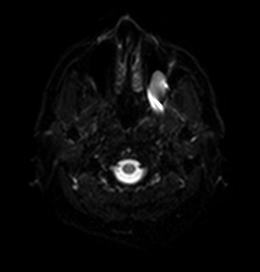
[im 13/104]
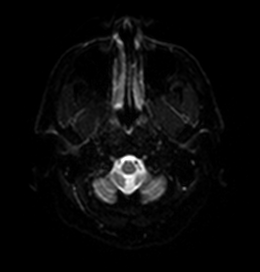
[im 20/104]
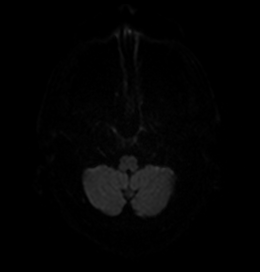
[im 26/104]
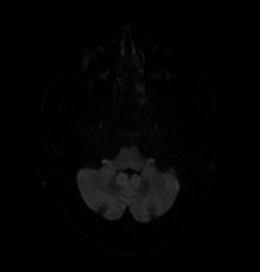
[im 33/104]
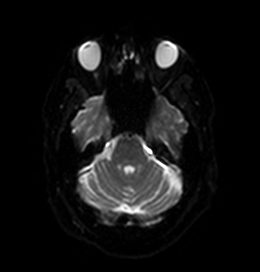
[im 39/104]
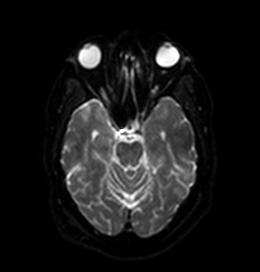
[im 46/104]
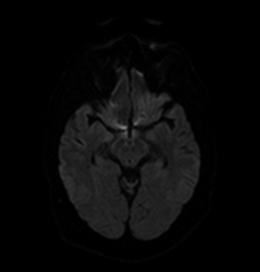
[im 52/104]
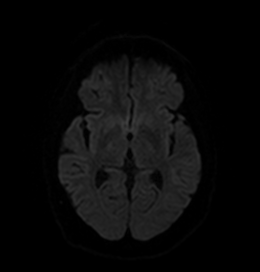
[im 58/104]
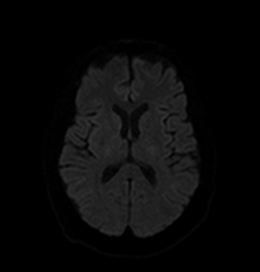
[im 65/104]
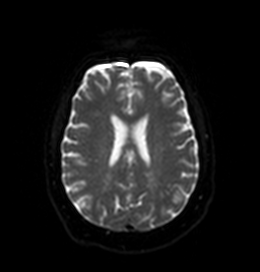
[im 71/104]
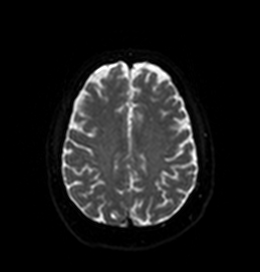
[im 78/104]
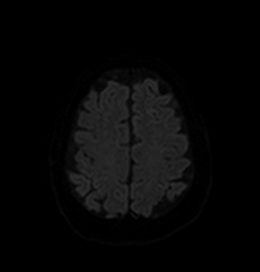
[im 84/104]
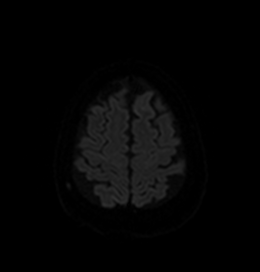
[im 91/104]
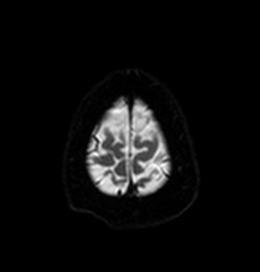
[im 97/104]
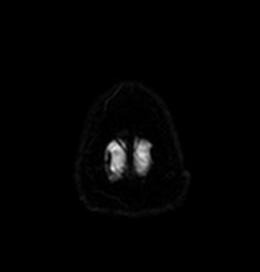
[im 104/104]
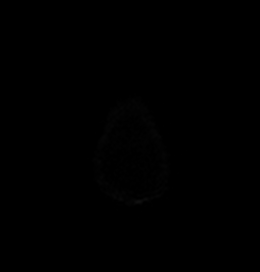

[Series 7: DWI · coronal · 4.0mm · 0.88mm/px · 11 of 72 slices shown (2 of 2)]
[im 1/72]
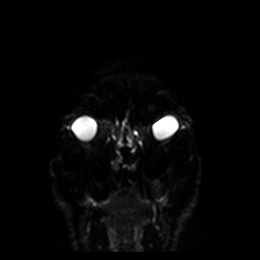
[im 8/72]
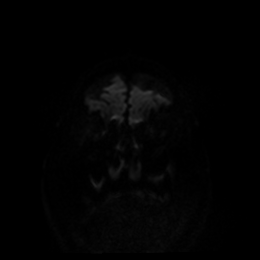
[im 15/72]
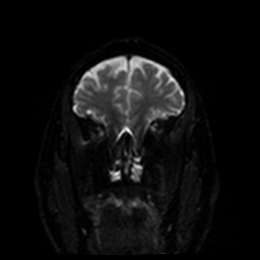
[im 22/72]
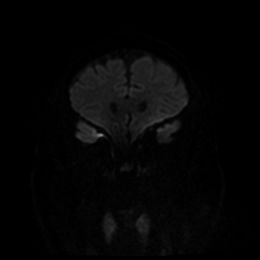
[im 29/72]
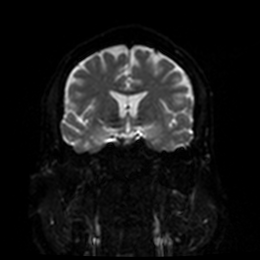
[im 36/72]
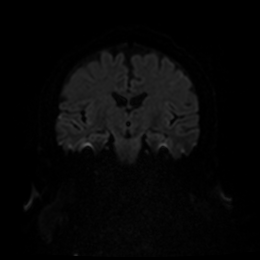
[im 43/72]
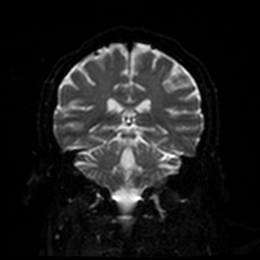
[im 50/72]
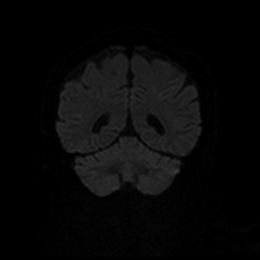
[im 57/72]
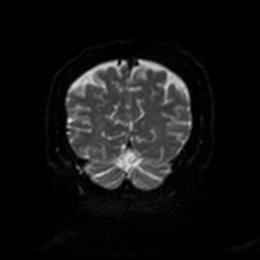
[im 64/72]
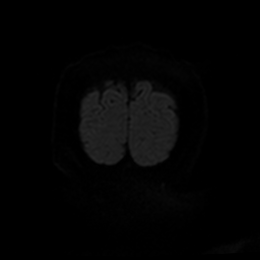
[im 72/72]
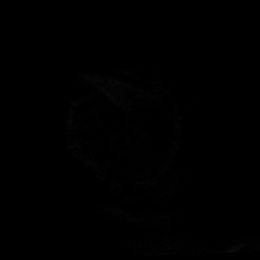

[Series 9: FLAIR · axial · 5.0mm · 0.47mm/px · z∈[-74,+74]mm · 4 of 26 slices shown]
[im 1/26]
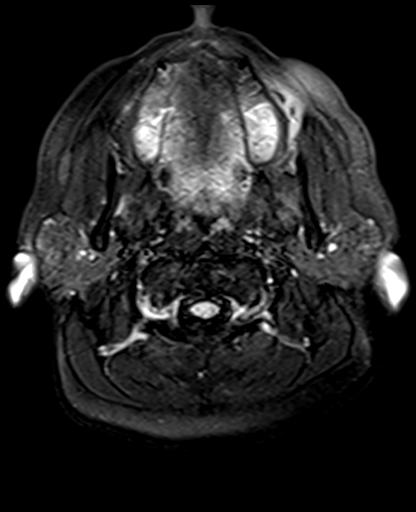
[im 9/26]
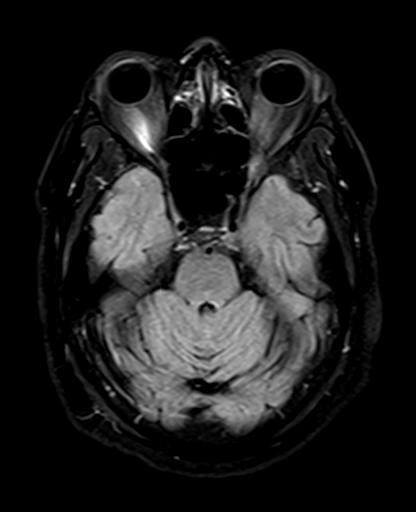
[im 17/26]
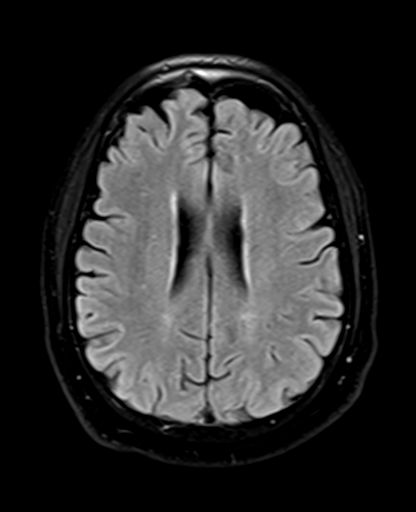
[im 26/26]
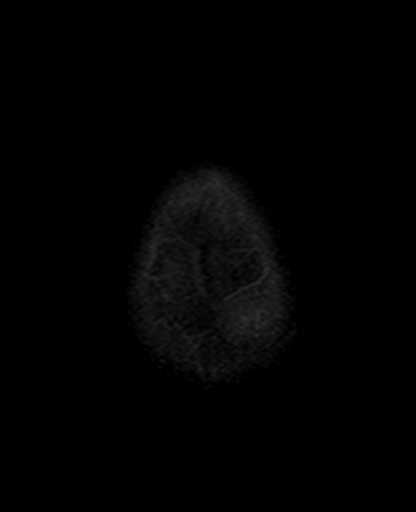

[Series 11: pha_images · axial · 3.0mm · 0.94mm/px · z∈[-67,+69]mm · 7 of 46 slices shown]
[im 1/46]
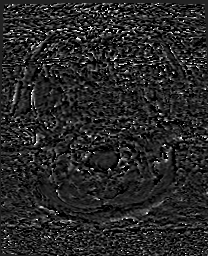
[im 8/46]
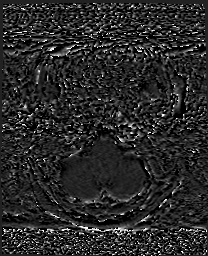
[im 16/46]
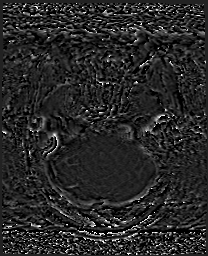
[im 23/46]
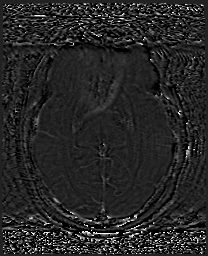
[im 31/46]
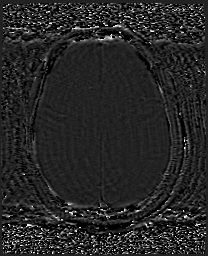
[im 38/46]
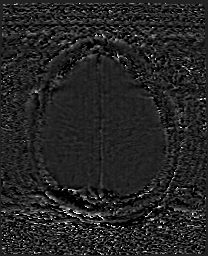
[im 46/46]
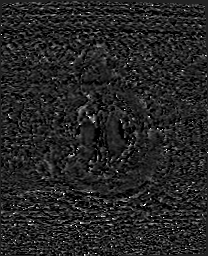

[Series 12: swi_images · axial · 3.0mm · 0.94mm/px · z∈[-82,+81]mm · 9 of 56 slices shown]
[im 1/56]
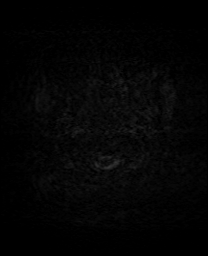
[im 7/56]
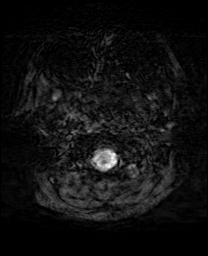
[im 14/56]
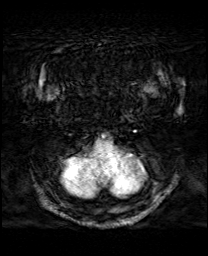
[im 21/56]
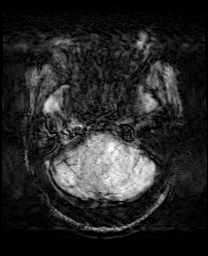
[im 28/56]
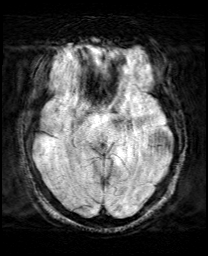
[im 35/56]
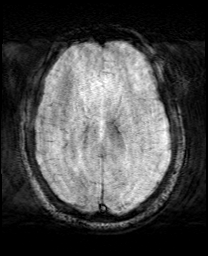
[im 42/56]
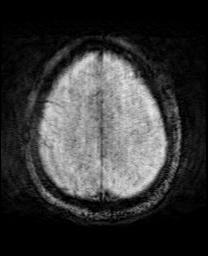
[im 49/56]
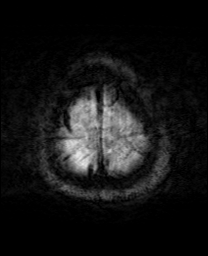
[im 56/56]
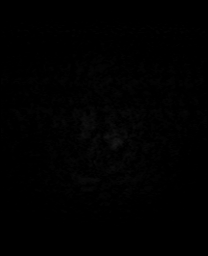

[48 of 48 positions shown; findings below may reference images not displayed]

FINDINGS: Incomplete study. T2 and T1 weighted imaging was not performed.
Within this limitation:

Brain: No acute infarction, hemorrhage, hydrocephalus, extra-axial
collection or mass lesion. Mild scattered T2 hyperintensities within
the white matter, which are nonspecific but most likely related to
chronic microvascular ischemic disease.

Vascular: Not evaluated due to the absence of T2 weighted imaging

Skull and upper cervical spine: Normal marrow signal.

Sinuses/Orbits: Left maxillary sinus retention cyst. Mild ethmoid
air cell mucosal thickening. No acute orbital findings.

Other: No sizable mastoid effusions.
IMPRESSION: 1. Incomplete study. Within this limitation, no evidence of acute
intracranial abnormality. Diffusion-weighted imaging is of good
quality and there is no evidence of acute infarct.
2. Mild chronic microvascular ischemic disease.

## 2021-11-25 ENCOUNTER — Encounter: Payer: Self-pay | Admitting: Dietician

## 2021-11-25 ENCOUNTER — Encounter: Payer: Medicare Other | Attending: Adult Health | Admitting: Dietician

## 2021-11-25 ENCOUNTER — Other Ambulatory Visit: Payer: Self-pay

## 2021-11-25 DIAGNOSIS — E1169 Type 2 diabetes mellitus with other specified complication: Secondary | ICD-10-CM | POA: Diagnosis not present

## 2021-11-25 NOTE — Progress Notes (Signed)
Diabetes Self-Management Education  Visit Type: First/Initial  Appt. Start Time: 1400 Appt. End Time: 1500  11/25/2021  [name], identified by name and date of birth, is a 65 y.o. male with a diagnosis of Diabetes: Type 2.   ASSESSMENT Pt is taking glipizide, jardiance, and metformin for their diabetes. Pt reports history of diarrhea with metformin, but switched to taking it on a full stomach and those symptoms subsided. Pt reports one instance of hypoglycemia, symptoms were nervousness, anxiety. Pt checked BG, it was 70, so pt ate something to bring it back up. Pt reports symptoms of hypoglycemia if they skip a meal sometimes. Pt reports checking their blood sugar fasting each morning. Current FBG around 125-130. Pt drinks 2-3 bottles of water each day. Pt visits the gym once a week and will walk. Pt likes to drink SSB, but states they have reduced tem recently. This has helped pt bring their A1c from 8.3 to 7.7 since July.  Height 6\' 3"  (1.905 m), weight 255 lb 6.4 oz (115.8 kg). Body mass index is 31.92 kg/m.   Diabetes Self-Management Education - 11/25/21 1416       Visit Information   Visit Type First/Initial      Initial Visit   Diabetes Type Type 2    Are you currently following a meal plan? No    Are you taking your medications as prescribed? Yes    Date Diagnosed 2013      Health Coping   How would you rate your overall health? Good      Psychosocial Assessment   Patient Belief/Attitude about Diabetes Motivated to manage diabetes    Self-care barriers None    Self-management support Doctor's office;Family;Friends    Other persons present Patient;Spouse/SO    Patient Concerns Nutrition/Meal planning    Special Needs None    Preferred Learning Style No preference indicated    Learning Readiness Ready    How often do you need to have someone help you when you read instructions, pamphlets, or other written materials from your doctor or pharmacy? 1 -  Never    What is the last grade level you completed in school? 12th grade      Pre-Education Assessment   Patient understands the diabetes disease and treatment process. Needs Instruction    Patient understands incorporating nutritional management into lifestyle. Needs Instruction    Patient undertands incorporating physical activity into lifestyle. Needs Instruction    Patient understands using medications safely. Needs Instruction    Patient understands monitoring blood glucose, interpreting and using results Needs Instruction    Patient understands prevention, detection, and treatment of acute complications. Needs Instruction    Patient understands prevention, detection, and treatment of chronic complications. Needs Instruction    Patient understands how to develop strategies to address psychosocial issues. Needs Instruction    Patient understands how to develop strategies to promote health/change behavior. Needs Instruction      Complications   Last HgB A1C per patient/outside source 7.7 %   10/22/2021   How often do you check your blood sugar? 1-2 times/day    Fasting Blood glucose range (mg/dL) 70-129    Have you had a dilated eye exam in the past 12 months? Yes    Have you had a dental exam in the past 12 months? No    Are you checking your feet? Yes    How many days per week are you checking your feet? 7      Dietary  Intake   Breakfast Honey bunches of oats cereal, whole milk, banana, coffee w/ sugar    Lunch BK whopper, sweet tea    Dinner Goulash ground beef, corn, green beans, pintos, onions, stewed tomatoes, banana peppers, Ritz crackers, [employer] (evening) [employer] administrator) Water, sweet tea, mountain dew, coffee      Exercise   Exercise Type ADL's;Light (walking / raking leaves)    How many days per week to you exercise? 2    How many minutes per day do you exercise? 60    Total minutes per week of exercise 120      Patient Education   Previous  Diabetes Education No    Disease state  Definition of diabetes, type 1 and 2, and the diagnosis of diabetes;Factors that contribute to the development of diabetes;Explored patient's options for treatment of their diabetes    Nutrition management  Role of diet in the treatment of diabetes and the relationship between the three main macronutrients and blood glucose level;Food label reading, portion sizes and measuring food.;Reviewed blood glucose goals for pre and post meals and how to evaluate the patients' food intake on their blood glucose level.;Meal timing in regards to the patients' current diabetes medication.    Physical activity and exercise  Role of exercise on diabetes management, blood pressure control and cardiac health.;Identified with patient nutritional and/or medication changes necessary with exercise.    Medications Reviewed patients medication for diabetes, action, purpose, timing of dose and side effects.    Monitoring Purpose and frequency of SMBG.;Identified appropriate SMBG and/or A1C goals.    Acute complications Taught treatment of hypoglycemia - the 15 rule.;Discussed and identified patients' treatment of hyperglycemia.    Chronic complications Assessed and discussed foot care and prevention of foot problems;Relationship between chronic complications and blood glucose control    Psychosocial adjustment Role of stress on diabetes;Helped patient identify a support system for diabetes management      Individualized Goals (developed by patient)   Nutrition Follow meal plan discussed    Physical Activity Exercise 1-2 times per week    Medications take my medication as prescribed    Monitoring  test my blood glucose as discussed      Post-Education Assessment   Patient understands the diabetes disease and treatment process. Needs Review    Patient understands incorporating nutritional management into lifestyle. Needs Review    Patient undertands incorporating physical activity  into lifestyle. Needs Review    Patient understands using medications safely. Needs Review    Patient understands monitoring blood glucose, interpreting and using results Needs Review    Patient understands prevention, detection, and treatment of acute complications. Needs Review    Patient understands prevention, detection, and treatment of chronic complications. Needs Review    Patient understands how to develop strategies to address psychosocial issues. Needs Review    Patient understands how to develop strategies to promote health/change behavior. Needs Review      Outcomes   Expected Outcomes Demonstrated interest in learning. Expect positive outcomes    Future DMSE 2 months    Program Status Not Completed             Individualized Plan for Diabetes Self-Management Training:   Learning Objective:  Patient will have a greater understanding of diabetes self-management. Patient education plan is to attend individual and/or group sessions per assessed needs and concerns.   Plan:   Patient Instructions  Continue to limit your  intake of sugar sweetened beverages! Have a smaller can of mountain dew and replace it with water.  Increase your physical activity gradually. Go for a walk 2-3 times a week for 20 or more minutes.  Continue eating three meals a day, about 5-6 hours apart!  Begin to recognize carbohydrates, protein, and non-starchy vegetables in your food choices!  Begin to build your meals using the proportions of the Balanced Plate. First, select your carb choice(s) for the meal. Next, select your source of protein to pair with your carb choice(s). Finally, complete the remaining half of your meal with a variety of non-starchy vegetables.  Check your blood sugar each morning before eating or drinking (fasting). Look for numbers under 125 mg/dL Check your blood sugar 2 hours after you begin eating a meal. Look for numbers under 180 mg/dL at all times.  Your goal  A1c is below 7%    Expected Outcomes:  Demonstrated interest in learning. Expect positive outcomes  Education material provided: ADA - How to Thrive: A Guide for Your Journey with Diabetes, Food label handouts, and Snack sheet  If problems or questions, patient to contact team via:  Phone and Email  Future DSME appointment: 2 months

## 2021-11-25 NOTE — Patient Instructions (Signed)
Continue to limit your intake of sugar sweetened beverages! Have a smaller can of mountain dew and replace it with water.  Increase your physical activity gradually. Go for a walk 2-3 times a week for 20 or more minutes.  Continue eating three meals a day, about 5-6 hours apart!  Begin to recognize carbohydrates, protein, and non-starchy vegetables in your food choices!  Begin to build your meals using the proportions of the Balanced Plate. First, select your carb choice(s) for the meal. Next, select your source of protein to pair with your carb choice(s). Finally, complete the remaining half of your meal with a variety of non-starchy vegetables.  Check your blood sugar each morning before eating or drinking (fasting). Look for numbers under 125 mg/dL Check your blood sugar 2 hours after you begin eating a meal. Look for numbers under 180 mg/dL at all times.  Your goal A1c is below 7%

## 2021-12-02 ENCOUNTER — Telehealth: Payer: Self-pay | Admitting: Pharmacist

## 2021-12-02 NOTE — Chronic Care Management (AMB) (Signed)
° ° °  Chronic Care Management Pharmacy Assistant   Name: Jason [name]  MRN: [id number] DOB: Apr 29, 1956  12/07/2021 APPOINTMENT REMINDER   Called [facility], No answer, left message of appointment on 12/07/2021 at 4:00 via telephone visit with [name], Pharm D. Notified to have all medications, supplements, blood pressure and/or blood sugar logs available during appointment and to return call if need to reschedule.  OR: [name] was reminded to have all medications, supplements and any blood glucose and blood pressure readings available for review with [name], Pharm. D, at his telephone visit on 12/07/2021 at 4:00.   Questions: Have you had any recent office visit or specialist visit outside of [facility]? Patient has recently seen a nutritionist, he does not recall the name.  Are there any concerns you would like to discuss during your office visit? Patient denies any concerns at this time.  Are you having any problems obtaining your medications? (Whether it pharmacy issues or cost) Patient denies having any issues getting medications.  If patient has any PAP medications ask if they are having any problems getting their PAP medication or refill? Patient denies any medications filled by PAP  Care Gaps: AWV - completed 09/08/2021 Zoster vaccines - overdue since 04/22/21 Covid-19 vaccine booster 4 -overdue since 7/22 Flu vaccine - due  Star Rating Drug: Glipizide 10mg  - last filled on 10/22/21 [facility] at [facility] Jardiance 25mg  - last filled on 11/20/21 [facility] at [facility] Rosuvastatin 5mg  - last filled on 10/16/21 [facility] at [facility] Metformin 500mg  - last filled on 10/22/21 [facility] at [facility]  Any gaps in medications fill history? No  Gennie Alma [facility]  Catering manager [phone]

## 2021-12-06 NOTE — Progress Notes (Signed)
Chronic Care Management Pharmacy Note  12/11/2021 Name:  Jason [name] MRN:  [id number] DOB:  07-08-1956  Summary: A1c not at goal < 7%   Recommendations/Changes made from today's visit: -Consider switching glipizide to GLP1 for better A1c lowering and ASCVD benefits -Recommended discussion with neurology for headache prevention medication -Recommended bringing BP cuff to next office visit to ensure accuracy    Plan: Follow up BP and DM assessment in 1 month    Subjective: Jason [name] is an 65 y.o. year old male who is a primary patient of Jason Peng, NP.  The CCM team was consulted for assistance with disease management and care coordination needs.    Engaged with patient by telephone for follow up visit in response to provider referral for pharmacy case management and/or care coordination services.   Consent to Services:  The patient was given information about Chronic Care Management services, agreed to services, and gave verbal consent prior to initiation of services.  Please see initial visit note for detailed documentation.   Patient Care Team: Jason Peng, NP as PCP - General (Family Medicine) [name], [facility] as Pharmacist (Pharmacist)  Recent office visits: 10/22/21 Jason Peng, NP: Patient presented for DM follow up. Increased glipizide to 10 mg BID and referred to nutrition. A1c decreased to 7.7%.  09/08/21 Randel Pigg, LPN: Patient presented for AWV.  08/03/21 Jason Peng NP (PCP) - seen for Mollaret's Meningitis and other issues. No medication changes. Will follow up regarding blood work.  06/17/21 Jason Peng, NP: Patient presented for DM follow up. Referral to pulmonary for sleep study.   Recent consult visits: 11/25/21 Jason [name], RD (nutrition): Patient presented for initial nutrition counseling.  09/06/21 Jason Harold, MD (neurology): Patient presented for headaches. Follow up in 3 months.  09/03/21 Jason Rist, MD  (pulmonary): Patient presented for initial consult for possible OSA. Schduled for lab polsomnogram.  08/11/21 Jason Pair MD (Infectious Disease) - seen for CSF pleocytosis. No medication changes. No need to follow up.  Hospital visits: Medication Reconciliation was completed by comparing discharge summary, patients EMR and Pharmacy list, and upon discussion with patient.   Admitted to the hospital on 07/20/21 due to Acute Encephalopathy. Discharge date was 07/28/21. Discharged from [place]?Medications Started at [facility] Discharge:?? -started None.   Medication Changes at Hospital Discharge: -Changed None.   Medications Discontinued at Hospital Discharge: -Stopped Ibuprofen.   Medications that remain the same after Hospital Discharge:??  -All other medications will remain the same.    7/30-07/15/21 Patient admitted for altered mental status. Vitamin B12 low and recommended 1000 mcg daily supplementation.  Objective:  Lab Results  Component Value Date   CREATININE 1.41 08/03/2021   BUN 17 08/03/2021   GFR 51.65 (L) 08/03/2021   GFRNONAA >60 07/28/2021   GFRAA >60 05/25/2011   NA 141 08/03/2021   K 5.0 08/03/2021   CALCIUM 10.6 (H) 08/03/2021   CO2 27 08/03/2021   GLUCOSE 95 08/03/2021    Lab Results  Component Value Date/Time   HGBA1C 7.7 (A) 10/22/2021 03:02 PM   HGBA1C 8.3 (H) 06/17/2021 08:31 AM   HGBA1C 7.2 (A) 03/04/2021 07:59 AM   HGBA1C 7.8 (H) 05/28/2020 07:46 AM   HGBA1C 9.5 (A) 01/10/2019 08:30 AM   GFR 51.65 (L) 08/03/2021 04:30 PM   GFR 58.60 (L) 06/17/2021 08:31 AM   MICROALBUR 6.6 (H) 06/17/2021 08:31 AM   MICROALBUR 3.1 (H) 10/10/2018 08:30 AM  Last diabetic Eye exam:  Lab Results  Component Value Date/Time   HMDIABEYEEXA No Retinopathy 10/12/2020 12:00 AM    Last diabetic Foot exam: No results found for: HMDIABFOOTEX   Lab Results  Component Value Date   CHOL 150 07/10/2021   HDL 61 07/10/2021   LDLCALC 79  07/10/2021   TRIG 49 07/10/2021   CHOLHDL 2.5 07/10/2021    Hepatic Function Latest Ref Rng & Units 08/03/2021 07/28/2021 07/27/2021  Total Protein 6.0 - 8.3 g/dL 8.1 6.9 6.7  Albumin 3.5 - 5.2 g/dL 4.0 3.1(L) 3.1(L)  AST 0 - 37 U/L '20 23 22  ' ALT 0 - 53 U/L 36 40 42  Alk Phosphatase 39 - 117 U/L 64 50 50  Total Bilirubin 0.2 - 1.2 mg/dL 0.3 0.5 0.4  Bilirubin, Direct 0.0 - 0.3 mg/dL - - -    Lab Results  Component Value Date/Time   TSH 2.119 07/10/2021 03:49 AM   TSH 2.92 06/17/2021 08:31 AM   TSH 2.96 05/28/2020 07:46 AM   FREET4 0.92 05/24/2011 01:59 PM    CBC Latest Ref Rng & Units 08/03/2021 07/28/2021 07/27/2021  WBC 4.0 - 10.5 K/uL 6.5 8.1 6.8  Hemoglobin 13.0 - 17.0 g/dL 14.0 13.4 13.2  Hematocrit 39.0 - 52.0 % 43.0 39.8 38.9(L)  Platelets 150.0 - 400.0 K/uL 268.0 173 174    No results found for: VD25OH  Clinical ASCVD: No  The 10-year ASCVD risk score (Arnett DK, et al., 2019) is: 20.7%   Values used to calculate the score:     Age: 65 years     Sex: Male     Is Non-Hispanic African American: Yes     Diabetic: Yes     Tobacco smoker: No     Systolic Blood Pressure: 409 mmHg     Is BP treated: Yes     HDL Cholesterol: 61 mg/dL     Total Cholesterol: 150 mg/dL    Depression screen [facility] 2/9 11/25/2021 09/08/2021 09/08/2021  Decreased Interest 0 0 0  Down, Depressed, Hopeless 0 0 0  PHQ - 2 Score 0 0 0  Altered sleeping - - -  Tired, decreased energy - - -  Change in appetite - - -  Feeling bad or failure about yourself  - - -  Trouble concentrating - - -  Moving slowly or fidgety/restless - - -  Suicidal thoughts - - -  PHQ-9 Score - - -  Difficult doing work/chores - - -      Social History   Tobacco Use  Smoking Status Never  Smokeless Tobacco Never   BP Readings from Last 3 Encounters:  10/22/21 110/72  09/06/21 (!) 148/90  09/03/21 122/78   Pulse Readings from Last 3 Encounters:  10/22/21 78  09/06/21 78  09/03/21 70   Wt Readings from Last  3 Encounters:  11/25/21 255 lb 6.4 oz (115.8 kg)  10/22/21 252 lb (114.3 kg)  10/15/21 240 lb (108.9 kg)   BMI Readings from Last 3 Encounters:  11/25/21 31.92 kg/m  10/22/21 31.50 kg/m  10/15/21 30.00 kg/m    Assessment/Interventions: Review of patient past medical history, allergies, medications, health status, including review of consultants reports, laboratory and other test data, was performed as part of comprehensive evaluation and provision of chronic care management services.   SDOH:  (Social Determinants of Health) assessments and interventions performed: No  SDOH Screenings   Alcohol Screen: Low Risk    Last Alcohol Screening Score (AUDIT): 0  Depression (  PHQ2-9): Low Risk    PHQ-2 Score: 0  Financial Resource Strain: Low Risk    Difficulty of Paying Living Expenses: Not hard at all  Food Insecurity: Not on file  Housing: [place] Risk Score: 0  Physical Activity: Sufficiently Active   Days of Exercise per Week: 5 days   Minutes of Exercise per Session: 50 min  Social Connections: Moderately Isolated   Frequency of Communication with Friends and Family: Three times a week   Frequency of Social Gatherings with Friends and Family: Three times a week   Attends Religious Services: More than 4 times per year   Active Member of Clubs or Organizations: No   Attends Archivist Meetings: Never   Marital Status: Widowed  Stress: Not on file  Tobacco Use: Low Risk    Smoking Tobacco Use: Never   Smokeless Tobacco Use: Never   Passive Exposure: Not on file  Transportation Needs: No Transportation Needs   Lack of Transportation (Medical): No   Lack of Transportation (Non-Medical): No    CCM Care Plan  Allergies  Allergen Reactions   Lisinopril     hyperkalemia   Reglan [Metoclopramide] Anxiety    Patient had bad reaction requiring ativan in addition to benadryl. He doesn't want to receive it any more    Medications Reviewed Today      Reviewed by Fredia Sorrow, RD (Registered Dietitian) on 11/25/21 at [place] List Status: <None>   Medication Order Taking? Sig Documenting Provider Last Dose Status Informant  aspirin-acetaminophen-caffeine (EXCEDRIN MIGRAINE) 825-053-97 MG tablet [id number]  Take 1 tablet by mouth every 6 (six) hours as needed for headache. [provider]  Active Spouse/Significant Other  Blood Glucose Monitoring Suppl (ONE TOUCH ULTRA 2) w/Device KIT [id number] Yes Use the device daily to check your blood sugars 1-4 times as instructed. Nafziger, Tommi Rumps, NP Taking Active Spouse/Significant Other  diltiazem (CARDIZEM CD) 120 MG 24 hr capsule [id number] Yes TAKE 1 CAPSULE BY MOUTH EVERY DAY Nafziger, Tommi Rumps, NP Taking Active Spouse/Significant Other  glipiZIDE (GLUCOTROL XL) 10 MG 24 hr tablet [id number] Yes Take 1 tablet (10 mg total) by mouth 2 (two) times daily. Nafziger, Tommi Rumps, NP Taking Active   glucose blood (ONE TOUCH ULTRA TEST) test strip [id number]  USE TO TEST BLOOD GLUCOSE TWICE DAILY Nafziger, Tommi Rumps, NP  Active Spouse/Significant Other  JARDIANCE 25 MG TABS tablet [id number] Yes TAKE 1 TABLET (25 MG TOTAL) BY MOUTH DAILY. Nafziger, Tommi Rumps, NP Taking Active   Lancets [facility] ULTRASOFT) lancets [id number]  Use to check blood sugar BID Nafziger, Tommi Rumps, NP  Active Spouse/Significant Other  metFORMIN (GLUCOPHAGE XR) 500 MG 24 hr tablet [id number] Yes Take 2 tablets (1,000 mg total) by mouth in the morning and at bedtime. Nafziger, Tommi Rumps, NP Taking Active   Multiple Vitamins-Minerals (CENTRUM SILVER PO) [id number] Yes Take 1 tablet by mouth daily. [provider] Taking Active Spouse/Significant Other  rosuvastatin (CRESTOR) 5 MG tablet [id number] Yes Take 1 tablet (5 mg total) by mouth at bedtime. Nafziger, Tommi Rumps, NP Taking Active Spouse/Significant Other  vitamin B-12 (CYANOCOBALAMIN) 1000 MCG tablet [id number] Yes Take 1 tablet (1,000 mcg total) by mouth daily. 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   Tick bite 07/20/2021   AMS (altered mental status) 07/10/2021   Marijuana abuse 07/10/2021   Class 1 obesity due to excess calories with body mass index (BMI) of 30.0 to 30.9 in adult 07/10/2021   Routine general medical examination at a health care facility 02/02/2016   Type 2 diabetes mellitus ([place]) 06/12/2012   Syncope - neurally mediated 07/04/2011   Essential hypertension 12/24/2007   HYPERGLYCEMIA 12/24/2007    Immunization History  Administered Date(s) Administered   Fluad Quad(high Dose 65+) 09/11/2019, 09/03/2020   Influenza Split 09/12/2012   Influenza Whole 12/24/2007   Influenza,inj,Quad PF,6+ Mos 02/02/2016, 10/10/2018   PFIZER Comirnaty(Gray Top)Covid-19 Tri-Sucrose Vaccine 02/25/2021   PFIZER(Purple Top)SARS-COV-2 Vaccination 02/24/2020, 03/16/2020, 02/25/2021   Pneumococcal Conjugate-13 09/11/2019   Pneumococcal Polysaccharide-23 02/25/2016   Td 01/31/2001   Tdap 02/02/2016   Zoster Recombinat (Shingrix) 02/25/2021   Patient reports he is having some discomfort with headaches and then takes the Excedrin and it goes away. He doesn't feel he needs to take this every day and takes about twice a week.  Patient reports he is working on lifestyle changes for managing his blood sugars. He feels as though these changes are sustainable. He is not having sugar in his coffee in the morning anymore as well and is trying to stay active as he spent this weekend chopping wood.  Conditions to be addressed/monitored:  Hypertension, Hyperlipidemia and Diabetes  Conditions addressed this visit: Hypertension, diabetes  Care Plan : CCM Pharmacy Care Plan  Updates made by [name], [place] since 12/11/2021 12:00 AM      Problem: Problem: diabetes, hypertension, hyperlipidemia      Long-Range Goal: Patient-Specific Goal   Start Date: 03/18/2021  Expected End Date: 03/18/2022  Recent Progress: On track  Priority: High  Note:   Current Barriers:  Unable to achieve control of diabetes   Pharmacist Clinical Goal(s):  Patient will achieve adherence to monitoring guidelines and medication adherence to achieve therapeutic efficacy achieve control of diabetes as evidenced by A1c  through collaboration with PharmD and provider.   Interventions: 1:1 collaboration with Jason Peng, NP regarding development and update of comprehensive plan of care as evidenced by provider attestation and co-signature Inter-disciplinary care team collaboration (see longitudinal plan of care) Comprehensive medication review performed; medication list updated in electronic medical record  Hypertension (BP goal <130/80) -Not ideally controlled -Current treatment: Diltiazem (Cardizem CD) 12m, 1 capsule once daily -Medications previously tried: none  -Current home readings: 140/80 (arm cuff - did not bring to office) -Current dietary habits: limits salt intake -Current exercise habits: walking almost daily; chopping wood this weekend -Denies hypotensive/hypertensive symptoms -Educated on Exercise goal of 150 minutes per week; Importance of home blood pressure monitoring; -Counseled to monitor BP at home twice weekly, document, and provide log at future appointments -Counseled on diet and exercise extensively Recommended to continue current medication Recommended bringing BP cuff to next office visit to make sure it is accurate.  Hyperlipidemia: (LDL goal < 70) -Uncontrolled -Current treatment: Rosuvastatin 5 mg 1 tablet daily  -Medications previously tried: none  -Current dietary patterns: working on cutting out sweets -Current exercise habits: walking almost daily -Educated on Cholesterol goals;  Benefits of statin  for ASCVD risk reduction; Importance of limiting foods high in cholesterol; Exercise goal of 150 minutes per week; -Counseled on diet and exercise extensively Recommended to continue current medication Recommended repeat lipid panel.  Diabetes (A1c goal <7%) -Not ideally controlled -Current medications: empagliflozin (Jardiance) 234m  1 tablet once daily  Glipizide (Glucotrol XL) 106m, 1 tablet twice daily with breakfast Metformin ER 5026m 2 tablets twice daily with a meal -Medications previously tried: none  -Current home glucose readings fasting glucose: 125-150 (only in the mornings), 276 (Dec 3rd) post prandial glucose: none -Reports hypoglycemic/hyperglycemic symptoms -Current meal patterns: reading package labels breakfast: did not discuss lunch: did not discuss  dinner: did not discuss snacks: trail mix  drinks: not drinking soda at all and sweet tea; diet mt. dew -Current exercise: walking a couple times a week, grandsons at the gym on the weekends -Educated on A1c and blood sugar goals; Exercise goal of 150 minutes per week; Carbohydrate counting and/or plate method -Counseled to check feet daily and get yearly eye exams -Counseled on diet and exercise extensively Recommended to continue current medication Recommended checking other times of the day a few times a week such as after meals or before bedtime Educated on benefits of GLP1 and for him to reconsider Trulicity.  Headaches (Goal: minimize severity of headaches) -Uncontrolled -Current treatment  Excedrin as needed -Medications previously tried: [place] risk for medication overuse headache with caffeine component and recommended limiting use.   Health Maintenance -Vaccine gaps: Prevnar 20 -Current therapy:  Vitamin B12 1000 mcg daily -Educated on Cost vs benefit of each product must be carefully weighed by individual consumer -Patient is satisfied with current therapy and denies  issues -Recommended to continue as is  Patient Goals/Self-Care Activities Patient will:  - take medications as prescribed check glucose daily, document, and provide at future appointments check blood pressure weekly, document, and provide at future appointments target a minimum of 150 minutes of moderate intensity exercise weekly engage in dietary modifications by cutting back on portion sizes  Follow Up Plan: Telephone follow up appointment with care management team member scheduled for: 4 months     Medication Assistance: None required.  Patient affirms current coverage meets needs.  Compliance/Adherence/Medication fill history: Care Gaps: Shingrix, COVID booster, influenza   Star-Rating Drugs: Glipizide 1076m last [facility] Jardiance 2m78mlast filled on [facility]/20/21 [facility] Rosuvastatin 5mg 21mast filled on 10/16/21 [facility] Metformin 500mg 34mst [facility]  [id number] [phone] pill box? No - patient reports routine; and taking all meds once a day in the morning. Pt endorses 100% compliance  We discussed: Current pharmacy is preferred with insurance plan and patient is satisfied with pharmacy services Patient decided to: Continue current medication management strategy  Care Plan and Follow Up Patient Decision:  Patient agrees to Care Plan and Follow-up.  Plan: Telephone follow up appointment with care management team member scheduled for:  4 months  MadelinJeni SallesD BCACP CAynorcist Le[facility]auerRancho [phone]

## 2021-12-07 ENCOUNTER — Ambulatory Visit (INDEPENDENT_AMBULATORY_CARE_PROVIDER_SITE_OTHER): Payer: Medicare Other | Admitting: Pharmacist

## 2021-12-07 ENCOUNTER — Telehealth: Payer: Self-pay | Admitting: Pharmacist

## 2021-12-07 DIAGNOSIS — I10 Essential (primary) hypertension: Secondary | ICD-10-CM

## 2021-12-07 DIAGNOSIS — E1169 Type 2 diabetes mellitus with other specified complication: Secondary | ICD-10-CM

## 2021-12-07 NOTE — Telephone Encounter (Signed)
°  Chronic Care Management   Outreach Note  12/07/2021 Name: Jason [name] MRN: [id number] DOB: February 14, 1956  Referred by: [name], NP  Patient had a phone appointment scheduled with clinical pharmacist today.  An unsuccessful telephone outreach was attempted today. The patient was referred to the pharmacist for assistance with care management and care coordination.   If possible, a message was left to return call to: [phone] or to [facility]: [place], PharmD, [place] [facility] [place]

## 2021-12-11 DIAGNOSIS — E1169 Type 2 diabetes mellitus with other specified complication: Secondary | ICD-10-CM

## 2021-12-11 DIAGNOSIS — I10 Essential (primary) hypertension: Secondary | ICD-10-CM | POA: Diagnosis not present

## 2021-12-11 NOTE — Patient Instructions (Signed)
Hi Jason [name],  It was great to catch up with you again! Don't forget to bring your blood pressure cuff to your next office visit to make sure it's accurate and keep on checking regularly at home.  Please reach out to me if you have any questions or need anything before our follow up!  Best, Jason [name]  Jason [name], PharmD, [place] [facility] [place]   Visit Information   Goals Addressed   None    Patient Care Plan: CCM Pharmacy Care Plan     Problem Identified: Problem: diabetes, hypertension, hyperlipidemia      Long-Range Goal: Patient-Specific Goal   Start Date: 03/18/2021  Expected End Date: 03/18/2022  Recent Progress: On track  Priority: High  Note:   Current Barriers:  Unable to achieve control of diabetes   Pharmacist Clinical Goal(s):  Patient will achieve adherence to monitoring guidelines and medication adherence to achieve therapeutic efficacy achieve control of diabetes as evidenced by A1c  through collaboration with PharmD and provider.   Interventions: 1:1 collaboration with Jason Peng, NP regarding development and update of comprehensive plan of care as evidenced by provider attestation and co-signature Inter-disciplinary care team collaboration (see longitudinal plan of care) Comprehensive medication review performed; medication list updated in electronic medical record  Hypertension (BP goal <130/80) -Not ideally controlled -Current treatment: Diltiazem (Cardizem CD) 120mg , 1 capsule once daily -Medications previously tried: none  -Current home readings: 140/80 (arm cuff - did not bring to office) -Current dietary habits: limits salt intake -Current exercise habits: walking almost daily; chopping wood this weekend -Denies hypotensive/hypertensive symptoms -Educated on Exercise goal of 150 minutes per week; Importance of home blood pressure monitoring; -Counseled to monitor BP at home twice weekly,  document, and provide log at future appointments -Counseled on diet and exercise extensively Recommended to continue current medication Recommended bringing BP cuff to next office visit to make sure it is accurate.  Hyperlipidemia: (LDL goal < 70) -Uncontrolled -Current treatment: Rosuvastatin 5 mg 1 tablet daily  -Medications previously tried: none  -Current dietary patterns: working on cutting out sweets -Current exercise habits: walking almost daily -Educated on Cholesterol goals;  Benefits of statin for ASCVD risk reduction; Importance of limiting foods high in cholesterol; Exercise goal of 150 minutes per week; -Counseled on diet and exercise extensively Recommended to continue current medication Recommended repeat lipid panel.  Diabetes (A1c goal <7%) -Not ideally controlled -Current medications: empagliflozin (Jardiance) 25mg , 1 tablet once daily  Glipizide (Glucotrol XL) 10mg , 1 tablet twice daily with breakfast Metformin ER 500mg , 2 tablets twice daily with a meal -Medications previously tried: none  -Current home glucose readings fasting glucose: 125-150 (only in the mornings), 276 (Dec 3rd) post prandial glucose: none -Reports hypoglycemic/hyperglycemic symptoms -Current meal patterns: reading package labels breakfast: did not discuss lunch: did not discuss  dinner: did not discuss snacks: trail mix  drinks: not drinking soda at all and sweet tea; diet mt. dew -Current exercise: walking a couple times a week, grandsons at the gym on the weekends -Educated on A1c and blood sugar goals; Exercise goal of 150 minutes per week; Carbohydrate counting and/or plate method -Counseled to check feet daily and get yearly eye exams -Counseled on diet and exercise extensively Recommended to continue current medication Recommended checking other times of the day a few times a week such as after meals or before bedtime Educated on benefits of GLP1 and for him to reconsider  Trulicity.  Headaches (Goal: minimize severity of headaches) -Uncontrolled -  Current treatment  Excedrin as needed -Medications previously tried: [name] on risk for medication overuse headache with caffeine component and recommended limiting use.   Health Maintenance -Vaccine gaps: Prevnar 20 -Current therapy:  Vitamin B12 1000 mcg daily -Educated on Cost vs benefit of each product must be carefully weighed by individual consumer -Patient is satisfied with current therapy and denies issues -Recommended to continue as is  Patient Goals/Self-Care Activities Patient will:  - take medications as prescribed check glucose daily, document, and provide at future appointments check blood pressure weekly, document, and provide at future appointments target a minimum of 150 minutes of moderate intensity exercise weekly engage in dietary modifications by cutting back on portion sizes  Follow Up Plan: Telephone follow up appointment with care management team member scheduled for: 4 months      Patient verbalizes understanding of instructions provided today and agrees to view in [place].  Telephone follow up appointment with pharmacy team member scheduled for: 4 months  Jason [name], [facility]

## 2021-12-15 NOTE — Progress Notes (Signed)
° °  CC:  headaches  Follow-up Visit  Last visit: 09/06/21  Brief HPI: 66 year old male with a history of DM2, HTN, HLD who follows in clinic for headaches which began in September 2022. At that time developed confusion and was admitted to the hospital where LP showed lymphocytic pleocytosis. MRI showed mild diffuse leptomeningeal enhancement and he was presumed to have viral meningitis. EEG was normal.  At his last visit headache had improved in frequency but were still occurring 3 times per week. He preferred to continue Excedrin as needed at that time as headaches had been improving.  Interval History: Headaches have improved since his last visit. Currently he has mild headaches twice per week which resolve on their own. Rarely needs to take Excedrin.   He has not had any more episodes of confusion or any new neurologic symptoms.  PSG on 10/15/21 revealed OSA. He has not yet spoken with his PCP about this but has an appointment next month.  Headache days per month: 8 Headache free days per month: 22  Current Headache Regimen: Preventative: none Abortive: Excedrin  Prior Therapies                                  excedrin  Physical Exam:   Vital Signs: BP 131/85    Pulse 86    Ht 6\' 3"  (1.905 m)    Wt 255 lb (115.7 kg)    BMI 31.87 kg/m  GENERAL:  well appearing, in no acute distress, alert  SKIN:  Color, texture, turgor normal. No rashes or lesions HEAD:  Normocephalic/atraumatic. RESP: normal respiratory effort MSK:  No gross joint deformities. No tenderness to palpation over occiput, neck, or shoulders.  NEUROLOGICAL: Mental Status: Alert, oriented to person, place and time, Follows commands, and Speech fluent and appropriate. Cranial Nerves: PERRL, face symmetric, no dysarthria, hearing grossly intact Motor: moves all extremities equally Gait: normal-based.  IMPRESSION: 66 year old male with a history of DM2, HTN, HLD who follows in clinic for headaches following  viral meningitis. His headaches have continued to improve in frequency and severity, and no longer require rescue medication. Recently found to have OSA which may also be contributing to his headaches. Provided supplement information for headache prevention. Advised him to return to clinic if headaches worsen or if he develops new neurologic symptoms.  PLAN: -Supplement information for headache prevention provided   Follow-up: as needed if headaches worsen or new neurologic symptoms develop  I spent a total of 17 minutes on the date of the service. Headache education was done.  Discussed treatment options including natural supplements and physical therapy. Written educational materials and patient instructions outlining all of the above were given.  [name], MD 12/16/21 1:52 PM

## 2021-12-16 ENCOUNTER — Other Ambulatory Visit: Payer: Self-pay

## 2021-12-16 ENCOUNTER — Ambulatory Visit: Payer: Medicare Other | Admitting: Psychiatry

## 2021-12-16 ENCOUNTER — Encounter: Payer: Self-pay | Admitting: Psychiatry

## 2021-12-16 VITALS — BP 131/85 | HR 86 | Ht 75.0 in | Wt 255.0 lb

## 2021-12-16 DIAGNOSIS — G44209 Tension-type headache, unspecified, not intractable: Secondary | ICD-10-CM

## 2021-12-16 NOTE — Patient Instructions (Signed)
° °  GENERAL HEADACHE INSTRUCTIONS Headache Preventive Treatment: Please keep in mind that it takes 4-6 weeks for the medication to start working well and 2-3 months at the appropriate dose before deciding if it will be useful or not. If it is not helping at all by this time, then we will discuss other medications to try. Supplements may take 3-6 months until you see full effect.   Natural supplements: Magnesium Oxide or Magnesium Glycinate 500 mg at bed (up to 800 mg daily) Coenzyme Q10 300 mg in AM Vitamin B2- 200 mg twice a day  Add 1 supplement at a time since even natural supplements can have undesirable side effects. You can sometimes buy supplements cheaper (especially Coenzyme Q10) at [email or web address] or at [employer].  Vitamins and herbs that show potential:  Magnesium: Magnesium (250 mg twice a day or 500 mg at bed) has a relaxant effect on smooth muscles such as blood vessels. Individuals suffering from frequent or daily headache usually have low magnesium levels which can be increase with daily supplementation of 400-750 mg. Three trials found 40-90% average headache reduction  when used as a preventative. Magnesium also demonstrated the benefit in menstrually related migraine.  Magnesium is part of the messenger system in the serotonin cascade and it is a good muscle relaxant.  It is also useful for constipation which can be a side effect of other medications used to treat migraine. Good sources include nuts, whole grains, and tomatoes. Side Effects: loose stool/diarrhea Riboflavin (vitamin B 2) 200 mg twice a day. This vitamin assists nerve cells in the production of ATP a principal energy storing molecule.  It is necessary for many chemical reactions in the body.  There have been at least 3 clinical trials of riboflavin using 400 mg per day all of which suggested that migraine frequency can be decreased.  All 3 trials showed significant improvement in over half of migraine sufferers.  The  supplement is found in bread, cereal, milk, meat, and poultry.  Most Americans get more riboflavin than the recommended daily allowance, however riboflavin deficiency is not necessary for the supplements to help prevent headache. Side effects: energizing, green urine  Coenzyme Q10: This is present in almost all cells in the body and is critical component for the conversion of energy.  Recent studies have shown that a nutritional supplement of CoQ10 can reduce the frequency of migraine attacks by improving the energy production of cells as with riboflavin.  Doses of 150 mg twice a day have been shown to be effective.  Melatonin: Increasing evidence shows correlation between melatonin secretion and headache conditions.  Melatonin supplementation has decreased headache intensity and duration.  It is widely used as a sleep aid.  Sleep is natures way of dealing with migraine.  A dose of 3 mg is recommended to start for headaches including cluster headache. Higher doses up to 15 mg has been reviewed for use in Cluster headache and have been used. The rationale behind using melatonin for cluster is that many theories regarding the cause of Cluster headache center around the disruption of the normal circadian rhythm in the brain.  This helps restore the normal circadian rhythm.

## 2022-01-03 ENCOUNTER — Other Ambulatory Visit: Payer: Self-pay | Admitting: Adult Health

## 2022-01-03 DIAGNOSIS — E1169 Type 2 diabetes mellitus with other specified complication: Secondary | ICD-10-CM

## 2022-01-15 ENCOUNTER — Other Ambulatory Visit: Payer: Self-pay | Admitting: Adult Health

## 2022-01-15 DIAGNOSIS — E1169 Type 2 diabetes mellitus with other specified complication: Secondary | ICD-10-CM

## 2022-01-20 ENCOUNTER — Encounter: Payer: Medicare Other | Attending: Adult Health | Admitting: Dietician

## 2022-01-20 ENCOUNTER — Other Ambulatory Visit: Payer: Self-pay

## 2022-01-20 ENCOUNTER — Encounter: Payer: Self-pay | Admitting: Dietician

## 2022-01-20 ENCOUNTER — Telehealth: Payer: Self-pay | Admitting: Pharmacist

## 2022-01-20 DIAGNOSIS — E1169 Type 2 diabetes mellitus with other specified complication: Secondary | ICD-10-CM | POA: Diagnosis not present

## 2022-01-20 NOTE — Patient Instructions (Addendum)
Work on getting back to your walks as the weather improves. This is going to be the big push to keep your success going!  Look for the [place] with the black cap and label.  When reading labels, first read the serving size and then TOTAL carbohydrates. Try to have no more than 45g per meal. Also, look for no more than 10% daily value of ADDED sugars.  Check your blood sugar each morning before eating or drinking (fasting). Look for numbers under 125 mg/dL Check your blood sugar 2 hours after you begin eating a meal. Look for numbers under 180 mg/dL at all times.  Your goal A1c is to stay below 6.5%

## 2022-01-20 NOTE — Progress Notes (Signed)
Diabetes Self-Management Education  Visit Type: Follow-up  Appt. Start Time: 1400 Appt. End Time: 3785  01/20/2022  [name], identified by name and date of birth, is a 66 y.o. male with a diagnosis of Diabetes:  .   ASSESSMENT Pt had their A1c checked during a house visit from [employer] on 01/06/22, it is down to 6.2 Pt is adherent to their medication, no side effects.  FBG is around 125-140, occasional 170s. Pt has occasional PPBG of 200-275, pt recognizes the foods that cause this. Pt has been limiting their starchy foods, more lean proteins and vegetables. Pt has support from friends and family, helping to prepare them diabetic friendly meals. Pt may snack on PB crackers or trail mix for snacks. Pt reports they have stopped drinking regular [facility], may have a diet soda occasionally but mostly water. May have sweet tea occasionally, but will have a smaller size now. Pt reports their physical activity has been lower recently, but pt has been doing work around the house that keeps them active. Pt states that they feel the changes they have made are sustainable. Pt will be visiting their PCP for a check up next week and is hoping to discontinue glipizide.  Height 6\' 3"  (1.905 m), weight 256 lb 1.6 oz (116.2 kg). Body mass index is 32.01 kg/m.   Diabetes Self-Management Education - 01/20/22 1419       Visit Information   Visit Type Follow-up      Complications   Last HgB A1C per patient/outside source 6.2 %   01/06/2022   How often do you check your blood sugar? 1-2 times/day    Fasting Blood glucose range (mg/dL) 130-179    Postprandial Blood glucose range (mg/dL) >200;180-200      Dietary Intake   Breakfast Almond crunch, 2% milk, coffee with 1 tsp sugar    Lunch [employer] Spicy chicken sandwich, fries, medium sweet tea    Dinner [employer], diet mountain dew    Beverage(s) coffee, sweet tea, diet mountain dew      Exercise   Exercise Type ADL's;Light  (walking / raking leaves)      Individualized Goals (developed by patient)   Nutrition Follow meal plan discussed;General guidelines for healthy choices and portions discussed    Physical Activity Exercise 3-5 times per week    Medications take my medication as prescribed    Monitoring  test my blood glucose as discussed      Patient Self-Evaluation of Goals - Patient rates self as meeting previously set goals (% of time)   Nutrition 50 - 75 %    Physical Activity 25 - 50%    Medications >75%    Monitoring >75%    Problem Solving 50 - 75 %    Reducing Risk 50 - 75 %    Health Coping 50 - 75 %      Post-Education Assessment   Patient understands the diabetes disease and treatment process. Needs Review    Patient understands incorporating nutritional management into lifestyle. Needs Review    Patient undertands incorporating physical activity into lifestyle. Needs Review    Patient understands using medications safely. Needs Review    Patient understands monitoring blood glucose, interpreting and using results Demonstrates understanding / competency    Patient understands prevention, detection, and treatment of acute complications. Needs Review    Patient understands prevention, detection, and treatment of chronic complications. Needs Review    Patient understands how to develop strategies to  address psychosocial issues. Needs Review    Patient understands how to develop strategies to promote health/change behavior. Needs Review      Outcomes   Expected Outcomes Demonstrated interest in learning. Expect positive outcomes    Future DMSE 3-4 months    Program Status Not Completed      Subsequent Visit   Since your last visit have you continued or begun to take your medications as prescribed? Yes    Since your last visit have you experienced any weight changes? No change    Since your last visit, are you checking your blood glucose at least once a day? Yes              Individualized Plan for Diabetes Self-Management Training:   Learning Objective:  Patient will have a greater understanding of diabetes self-management. Patient education plan is to attend individual and/or group sessions per assessed needs and concerns.   Plan:   Patient Instructions  Work on getting back to your walks as the weather improves. This is going to be the big push to keep your success going!  Look for the [place] with the black cap and label.  When reading labels, first read the serving size and then TOTAL carbohydrates. Try to have no more than 45g per meal. Also, look for no more than 10% daily value of ADDED sugars.  Check your blood sugar each morning before eating or drinking (fasting). Look for numbers under 125 mg/dL Check your blood sugar 2 hours after you begin eating a meal. Look for numbers under 180 mg/dL at all times.  Your goal A1c is to stay below 6.5%     Expected Outcomes:  Demonstrated interest in learning. Expect positive outcomes  If problems or questions, patient to contact team via:  Phone and Email  Future DSME appointment: 3-4 months

## 2022-01-20 NOTE — Chronic Care Management (AMB) (Signed)
Chronic Care Management Pharmacy Assistant   Name: RYSHAWN SANZONE  MRN: [id number] DOB: Dec 14, 1955  Reason for Encounter: Disease State / Hypertension and Diabetes Assessment Call    Conditions to be addressed/monitored: HTN and DMII   Recent office visits:  None  Recent consult visits:  12/16/2021 [name] MD (neurology) - Patient was seen for a tension headache. No medication changes. Return if symptoms worsen or fail to improve.  Hospital visits:  None  Medications: Outpatient Encounter Medications as of 01/20/2022  Medication Sig   aspirin-acetaminophen-caffeine (EXCEDRIN MIGRAINE) 250-250-65 MG tablet Take 1 tablet by mouth every 6 (six) hours as needed for headache.   Blood Glucose Monitoring Suppl (ONE TOUCH ULTRA 2) w/Device KIT Use the device daily to check your blood sugars 1-4 times as instructed.   diltiazem (CARDIZEM CD) 120 MG 24 hr capsule TAKE 1 CAPSULE BY MOUTH EVERY DAY   empagliflozin (JARDIANCE) 25 MG TABS tablet Take 1 tablet (25 mg total) by mouth daily.   glipiZIDE (GLUCOTROL XL) 10 MG 24 hr tablet TAKE 1 TABLET BY MOUTH TWICE A DAY   glucose blood (ONE TOUCH ULTRA TEST) test strip USE TO TEST BLOOD GLUCOSE TWICE DAILY   Lancets (ONETOUCH ULTRASOFT) lancets Use to check blood sugar BID   metFORMIN (GLUCOPHAGE XR) 500 MG 24 hr tablet Take 2 tablets (1,000 mg total) by mouth in the morning and at bedtime.   Multiple Vitamins-Minerals (CENTRUM SILVER PO) Take 1 tablet by mouth daily.   rosuvastatin (CRESTOR) 5 MG tablet Take 1 tablet (5 mg total) by mouth at bedtime.   vitamin B-12 (CYANOCOBALAMIN) 1000 MCG tablet Take 1 tablet (1,000 mcg total) by mouth daily.   No facility-administered encounter medications on file as of 01/20/2022.  Fill History: JARDIANCE 25 MG TABLET 01/04/2022 90   ROSUVASTATIN CALCIUM  5 MG TABS 01/16/2022 90   DILTIAZEM HYDROCHLORIDE ER  120 MG CP24 01/16/2022 90   GLIPIZIDE ER  10 MG TB24 01/17/2022 90   METFORMIN  HCL ER 500 MG TABLET 10/22/2021 90    Reviewed chart prior to disease state call. Spoke with patient regarding BP  Recent Office Vitals: BP Readings from Last 3 Encounters:  12/16/21 131/85  10/22/21 110/72  09/06/21 (!) 148/90   Pulse Readings from Last 3 Encounters:  12/16/21 86  10/22/21 78  09/06/21 78    Wt Readings from Last 3 Encounters:  12/16/21 255 lb (115.7 kg)  11/25/21 255 lb 6.4 oz (115.8 kg)  10/22/21 252 lb (114.3 kg)     Kidney Function Lab Results  Component Value Date/Time   CREATININE 1.41 08/03/2021 04:30 PM   CREATININE 1.23 07/28/2021 09:16 AM   GFR 51.65 (L) 08/03/2021 04:30 PM   GFRNONAA >60 07/28/2021 09:16 AM   GFRAA >60 05/25/2011 04:49 AM    BMP Latest Ref Rng & Units 08/03/2021 07/28/2021 07/28/2021  Glucose 70 - 99 mg/dL 95 - 201(H)  BUN 6 - 23 mg/dL 17 - 19  Creatinine 0.40 - 1.50 mg/dL 1.41 1.23 1.46(H)  Sodium 135 - 145 mEq/L 141 - 138  Potassium 3.5 - 5.1 mEq/L 5.0 - 3.9  Chloride 96 - 112 mEq/L 106 - 104  CO2 19 - 32 mEq/L 27 - 28  Calcium 8.4 - 10.5 mg/dL 10.6(H) - 9.4    Current antihypertensive regimen:  Diltiazem 120 mg once daily  How often are you checking your Blood Pressure? Patient states he is checking infrequently  Current home BP readings: Last reading was  last week when a  [facility] nurse came for a routine annual check and his blood pressure was 136/85  What recent interventions/DTPs have been made by any provider to improve Blood Pressure control since last CPP Visit: No recent interventions.   Any recent hospitalizations or ED visits since last visit with CPP? No recent hospital visits.   What diet changes have been made to improve Blood Pressure Control?  Patient states he has been to the nutritionist and he is now following a low sodium diet, has stopped soda's and is watching how many carbs he is eating. Breakfast - patient will have cereal and sometimes sausage with waffles.  Lunch - patient will have a chicken  sandwich from [facility] - patient will have a meal with a meat and vegetable.   What exercise is being done to improve your Blood Pressure Control?  Patient states he is walking daily and is doing yard work.  Adherence Review: Is the patient currently on ACE/ARB medication? Yes Does the patient have >5 day gap between last estimated fill dates? No   Recent Relevant Labs: Lab Results  Component Value Date/Time   HGBA1C 7.7 (A) 10/22/2021 03:02 PM   HGBA1C 8.3 (H) 06/17/2021 08:31 AM   HGBA1C 7.2 (A) 03/04/2021 07:59 AM   HGBA1C 7.8 (H) 05/28/2020 07:46 AM   HGBA1C 9.5 (A) 01/10/2019 08:30 AM   MICROALBUR 6.6 (H) 06/17/2021 08:31 AM   MICROALBUR 3.1 (H) 10/10/2018 08:30 AM    Kidney Function Lab Results  Component Value Date/Time   CREATININE 1.41 08/03/2021 04:30 PM   CREATININE 1.23 07/28/2021 09:16 AM   GFR 51.65 (L) 08/03/2021 04:30 PM   GFRNONAA >60 07/28/2021 09:16 AM   GFRAA >60 05/25/2011 04:49 AM    Current antihyperglycemic regimen:  Jardiance 25 mg 1 tablet once daily Glipizide 10 mg 1 tablet twice daily Metformin 200 mg 2 tablets twice daily  What recent interventions/DTPs have been made to improve glycemic control:  No recent interventions  Have there been any recent hospitalizations or ED visits since last visit with CPP? No recent hospital visits.   Patient denies hypoglycemic symptoms  Patient denies hyperglycemic symptoms  How often are you checking your blood sugar? Patient states he is checking his blood sugars fasting, once daily.  Patient states that a [facility] nurse came for a routine annual check and his A1C was 6.2  What are your blood sugars ranging?  Fasting: 125-140  During the week, how often does your blood glucose drop below 70? Patient states his blood sugars never drop below 70  Are you checking your feet daily/regularly? Patient states he is checking his feel daily.   Adherence Review: Is the patient currently on a STATIN  medication? Yes Is the patient currently on ACE/ARB medication? Yes Does the patient have >5 day gap between last estimated fill dates? No  Care Gaps: AWV - message sent to Ramond Craver Last BP - 131/85 on 12/16/2021 Last A1C - 7.7 on 10/22/2021 Pneumonia vaccine - overdue Covid booster - overdue Shingrix - overdue Flu vaccine - overdue Eye exam - overdue  Star Rating Drugs: Jardiance 25 mg - last filled 01/04/2022 90 [facility] at [facility] Simvastatin 5 mg - last filled 01/16/2022 90 [facility] Glipizide 10 mg - last filled 01/17/2022 90 DS at [facility] Metformin 500 mg - last filled 10/22/2021 90 DS at [place] Pharmacist Assistant [phone]

## 2022-01-27 ENCOUNTER — Encounter: Payer: Self-pay | Admitting: Adult Health

## 2022-01-27 ENCOUNTER — Telehealth: Payer: Self-pay | Admitting: Adult Health

## 2022-01-27 ENCOUNTER — Ambulatory Visit (INDEPENDENT_AMBULATORY_CARE_PROVIDER_SITE_OTHER): Payer: Medicare Other | Admitting: Adult Health

## 2022-01-27 VITALS — BP 130/88 | HR 81 | Temp 98.6°F | Ht 75.0 in | Wt 255.0 lb

## 2022-01-27 DIAGNOSIS — E1169 Type 2 diabetes mellitus with other specified complication: Secondary | ICD-10-CM | POA: Diagnosis not present

## 2022-01-27 DIAGNOSIS — I10 Essential (primary) hypertension: Secondary | ICD-10-CM

## 2022-01-27 LAB — POCT GLYCOSYLATED HEMOGLOBIN (HGB A1C): Hemoglobin A1C: 8.1 % — AB (ref 4.0–5.6)

## 2022-01-27 LAB — BASIC METABOLIC PANEL
BUN: 17 mg/dL (ref 6–23)
CO2: 32 mEq/L (ref 19–32)
Calcium: 10.2 mg/dL (ref 8.4–10.5)
Chloride: 102 mEq/L (ref 96–112)
Creatinine, Ser: 1.36 mg/dL (ref 0.40–1.50)
GFR: 53.75 mL/min — ABNORMAL LOW (ref >60.00)
Glucose, Bld: 184 mg/dL — ABNORMAL HIGH (ref 70–99)
Potassium: 4.7 mEq/L (ref 3.5–5.1)
Sodium: 138 mEq/L (ref 135–145)

## 2022-01-27 LAB — HEMOGLOBIN A1C: Hgb A1c MFr Bld: 8.3 % — ABNORMAL HIGH (ref 4.6–6.5)

## 2022-01-27 MED ORDER — TRULICITY 0.75 MG/0.5ML ~~LOC~~ SOAJ: 0.7500 mg | SUBCUTANEOUS | 2 refills | Status: DC

## 2022-01-27 NOTE — Progress Notes (Signed)
Subjective:    Patient ID: Jason [name], male    DOB: Jan 28, 1956, 66 y.o.   MRN: [id number]  HPI  .66 year old male who  has a past medical history of Bell palsy, Diabetes mellitus without complication ([place]), and HTN (hypertension).  He presents to the office today for 50-monthfollow-up regarding diabetes and hypertension  Diabetes mellitus type 2-currently prescribed glipizide 10 mg ER BID, metformin 1000 mg daily, and Jardiance 25 mg daily.  He does check his blood sugars periodically at home with readings between 130 and 140.  He has had some hypoglycemic episodes and stopped taking glipizide about a week ago and has felt better.  His last A1c in November 2022 was 7.7.   He had a medicare wellness visit done by a home health NP through his insurance a few weeks ago and per their report his A1c was 6.2   Lab Results  Component Value Date   HGBA1C 8.1 (A) 01/27/2022   HTN -takes Cardizem 180 mg daily.  He does not monitor his blood pressure at home.  Denies dizziness, lightheadedness, chest pain, or shortness of breath BP Readings from Last 3 Encounters:  01/27/22 130/88  12/16/21 131/85  10/22/21 110/72   Review of Systems See HPI   Past Medical History:  Diagnosis Date   Bell palsy    Diabetes mellitus without complication ([place]    HTN (hypertension)     Social History   Socioeconomic History   Marital status: Widowed    Spouse name: Not on file   Number of children: 2   Years of education: 12   Highest education level: Not on file  Occupational History   Occupation: transportation    Comment: CPublishing rights manager Tobacco Use   Smoking status: Never   Smokeless tobacco: Never  Vaping Use   Vaping Use: Never used  Substance and Sexual Activity   Alcohol use: Yes    Alcohol/week: 6.0 standard drinks    Types: 6 Cans of beer per week    Comment: on weekends   Drug use: No   Sexual activity: Not on file  Other Topics Concern   Not on file  Social  History Narrative      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       Fun: fix cars, drag race    Social Determinants of Health   Financial Resource Strain: Low Risk    Difficulty of Paying Living Expenses: Not hard at all  Food Insecurity: Not on file  Transportation Needs: No Transportation Needs   Lack of Transportation (Medical): No   Lack of Transportation (Non-Medical): No  Physical Activity: Sufficiently Active   Days of Exercise per Week: 5 days   Minutes of Exercise per Session: 50 min  Stress: Not on file  Social Connections: Moderately Isolated   Frequency of Communication with Friends and Family: Three times a week   Frequency of Social Gatherings with Friends and Family: Three times a week   Attends Religious Services: More than 4 times per year   Active Member of Clubs or Organizations: No   Attends CArchivistMeetings: Never   Marital Status: Widowed  IHuman resources officerViolence: Not At Risk   Fear of Current or Ex-Partner: No   Emotionally Abused: No   Physically Abused: No   Sexually Abused: No    Past Surgical History:  Procedure Laterality Date   COLONOSCOPY  W/ POLYPECTOMY      Family History  Problem Relation Age of Onset   Stroke Mother    Colon cancer Sister    High blood pressure Sister    Colon cancer Brother    High blood pressure Brother    Stomach cancer Brother    Lymphoma Son    Colon cancer Other        1st degree relative <60   Diabetes Other        1st degree relative    Stroke Other    Esophageal cancer Neg Hx    Rectal cancer Neg Hx     Allergies  Allergen Reactions   Lisinopril     hyperkalemia   Reglan [Metoclopramide] Anxiety    Patient had bad reaction requiring ativan in addition to benadryl. He doesn't want to receive it any more    Current Outpatient Medications on File Prior to Visit  Medication Sig Dispense Refill   aspirin-acetaminophen-caffeine (EXCEDRIN MIGRAINE)  250-250-65 MG tablet Take 1 tablet by mouth every 6 (six) hours as needed for headache.     Blood Glucose Monitoring Suppl (ONE TOUCH ULTRA 2) w/Device KIT Use the device daily to check your blood sugars 1-4 times as instructed. 1 kit 0   diltiazem (CARDIZEM CD) 120 MG 24 hr capsule TAKE 1 CAPSULE BY MOUTH EVERY DAY 90 capsule 3   empagliflozin (JARDIANCE) 25 MG TABS tablet Take 1 tablet (25 mg total) by mouth daily. 90 tablet 1   glipiZIDE (GLUCOTROL XL) 10 MG 24 hr tablet TAKE 1 TABLET BY MOUTH TWICE A DAY 180 tablet 0   glucose blood (ONE TOUCH ULTRA TEST) test strip USE TO TEST BLOOD GLUCOSE TWICE DAILY 200 each 3   Lancets (ONETOUCH ULTRASOFT) lancets Use to check blood sugar BID 100 each 12   Multiple Vitamins-Minerals (CENTRUM SILVER PO) Take 1 tablet by mouth daily.     rosuvastatin (CRESTOR) 5 MG tablet Take 1 tablet (5 mg total) by mouth at bedtime. 90 tablet 3   vitamin B-12 (CYANOCOBALAMIN) 1000 MCG tablet Take 1 tablet (1,000 mcg total) by mouth daily. 30 tablet 0   metFORMIN (GLUCOPHAGE XR) 500 MG 24 hr tablet Take 2 tablets (1,000 mg total) by mouth in the morning and at bedtime. 360 tablet 0   No current facility-administered medications on file prior to visit.    BP 130/88    Pulse 81    Temp 98.6 F (37 C) (Oral)    Ht '6\' 3"'  (1.905 m)    Wt 255 lb (115.7 kg)    SpO2 97%    BMI 31.87 kg/m       Objective:   Physical Exam Vitals and nursing note reviewed.  Cardiovascular:     Rate and Rhythm: Normal rate and regular rhythm.     Pulses: Normal pulses.     Heart sounds: Normal heart sounds.  Pulmonary:     Effort: Pulmonary effort is normal.     Breath sounds: Normal breath sounds.  Musculoskeletal:        General: Normal range of motion.  Skin:    General: Skin is warm and dry.  Neurological:     General: No focal deficit present.     Mental Status: He is oriented to person, place, and time.  Psychiatric:        Mood and Affect: Mood normal.        Behavior:  Behavior normal.        Thought  Content: Thought content normal.        Judgment: Judgment normal.      Assessment & Plan:  1. Type 2 diabetes mellitus with other specified complication, without long-term current use of insulin (HCC) POC A1c was 8.1 - will check through whole blood  - Consider trulicity  - POC HgB L8V - Hemoglobin A1c; Future - Basic Metabolic Panel; Future  2. Essential hypertension - Well controlled.  - No change in medication   [name], NP

## 2022-01-27 NOTE — Patient Instructions (Signed)
Health Maintenance Due  Topic Date Due   Pneumonia Vaccine 48+ Years old (76) 02/24/2021   COVID-19 Vaccine (5 - Booster) 04/22/2021   Zoster Vaccines- Shingrix (2 of 2) 04/22/2021   INFLUENZA VACCINE  07/12/2021   OPHTHALMOLOGY EXAM  10/12/2021    Depression screen PHQ 2/9 11/25/2021 09/08/2021 09/08/2021  Decreased Interest 0 0 0  Down, Depressed, Hopeless 0 0 0  PHQ - 2 Score 0 0 0  Altered sleeping - - -  Tired, decreased energy - - -  Change in appetite - - -  Feeling bad or failure about yourself  - - -  Trouble concentrating - - -  Moving slowly or fidgety/restless - - -  Suicidal thoughts - - -  PHQ-9 Score - - -  Difficult doing work/chores - - -

## 2022-01-27 NOTE — Telephone Encounter (Signed)
Updated patient on his labs. A1c was 8.3.   He is ok with starting on Trulicity. Side effects reviewed

## 2022-04-05 ENCOUNTER — Telehealth: Payer: Self-pay | Admitting: Pharmacist

## 2022-04-05 NOTE — Chronic Care Management (AMB) (Signed)
? ? ?  Chronic Care Management ?Pharmacy Assistant  ? ?Name: [name]  MRN: [id number] DOB: 04/04/1956 ? ?04/06/2022 APPOINTMENT REMINDER ? ?Archie Endo was reminded to have all medications, supplements and any blood glucose and blood pressure readings available for review with [name], Pharm. D, at his telephone visit on 04/06/2022 at 3:00. ? ? ?Care Gaps: ?AWV - previous message sent to Ramond Craver ?Last BP - 130/88 on 01/27/2022 ?Last A1C - 8.3 on 01/27/2022 ?Pneumonia vaccine - overdue ?Covid booster - overdue ?Shingrix - overdue ?Eye exam - overdue ?  ?Star Rating Drugs: ?Jardiance 25 mg - last filled [facility]/04/2022 [facility] ?Simvastatin 5 mg - last filled 01/16/2022 [facility] ?Glipizide 10 mg - last filled 01/17/2022 [facility] ?Metformin 500 mg - last filled 10/22/2021 [facility] ? ? ?Any gaps in medications fill history? ? ?Gennie Alma CMA  ?Clinical Pharmacist Assistant ?[phone] ? ?

## 2022-04-06 ENCOUNTER — Ambulatory Visit (INDEPENDENT_AMBULATORY_CARE_PROVIDER_SITE_OTHER): Payer: Medicare Other | Admitting: Pharmacist

## 2022-04-06 DIAGNOSIS — E1169 Type 2 diabetes mellitus with other specified complication: Secondary | ICD-10-CM

## 2022-04-06 DIAGNOSIS — I10 Essential (primary) hypertension: Secondary | ICD-10-CM

## 2022-04-06 NOTE — Patient Instructions (Signed)
Hi Mister, ? ?It was great to catch up again! Don't forget to bring your blood pressure cuff to your next office visit with [facility]. Also keep on changing up the time of day you are checking blood sugars so we can get a better picture of what is going on throughout the day. ? ?Please reach out to me if you have any questions or need anything! ? ?Best, ?Maddie ? ?[name], PharmD, BCACP ?Clinical Pharmacist ?Therapist, music at [place] ?[phone] ? ? Visit Information ? ? Goals Addressed   ?None ?  ? ?Patient Care Plan: [place]  ?  ? ?Problem Identified: Problem: diabetes, hypertension, hyperlipidemia   ?  ? ?Long-Range Goal: Patient-Specific Goal   ?Start Date: 03/18/2021  ?Expected End Date: 03/18/2022  ?Recent Progress: On track  ?Priority: High  ?Note:   ?Current Barriers:  ?Unable to achieve control of diabetes  ? ?Pharmacist Clinical Goal(s):  ?Patient will achieve adherence to monitoring guidelines and medication adherence to achieve therapeutic efficacy ?achieve control of diabetes as evidenced by A1c  through collaboration with PharmD and provider.  ? ?Interventions: ?1:1 collaboration with [name], NP regarding development and update of comprehensive plan of care as evidenced by provider attestation and co-signature ?Inter-disciplinary care team collaboration (see longitudinal plan of care) ?Comprehensive medication review performed; medication list updated in electronic medical record ? ?Hypertension (BP goal <130/80) ?-Not ideally controlled ?-Current treatment: ?Diltiazem (Cardizem CD) '120mg'$ , 1 capsule once daily - Appropriate, Query effective, Safe, Accessible ?-Medications previously tried: none  ?-Current home readings: 140/70 (arm cuff - did not bring to office) ?-Current dietary habits: limits salt intake ?-Current exercise habits: walking almost daily; chopping wood this weekend ?-Denies hypotensive/hypertensive symptoms ?-Educated on Exercise goal of 150 minutes per  week; ?Importance of home blood pressure monitoring; ?-Counseled to monitor BP at home twice weekly, document, and provide log at future appointments ?-Counseled on diet and exercise extensively ?Recommended to continue current medication ?Recommended bringing BP cuff to next office visit to make sure it is accurate. ? ?Hyperlipidemia: (LDL goal < 70) ?-Uncontrolled ?-Current treatment: ?Rosuvastatin 5 mg 1 tablet daily - Appropriate, Query effective, Safe, Accessible ?-Medications previously tried: none  ?-Current dietary patterns: working on cutting out sweets ?-Current exercise habits: walking almost daily ?-Educated on Cholesterol goals;  ?Benefits of statin for ASCVD risk reduction; ?Importance of limiting foods high in cholesterol; ?Exercise goal of 150 minutes per week; ?-Counseled on diet and exercise extensively ?Recommended to continue current medication ?Recommended repeat lipid panel. ? ?Diabetes (A1c goal <7%) ?-Not ideally controlled ?-Current medications: ?empagliflozin (Jardiance) '25mg'$ , 1 tablet once daily - Appropriate, Query effective, Safe, Accessible ?Glipizide (Glucotrol XL) '10mg'$ , 1 tablet twice daily with breakfast - Appropriate, Query effective, Safe, Accessible - not taking ?Metformin ER '500mg'$ , 2 tablets twice daily with a meal - Appropriate, Query effective, Safe, Accessible - taking 1 tablet twice daily ?Trulicity 5.85 mg inject once weekly - Appropriate, Query effective, Safe, Accessible ?-Medications previously tried: none  ?-Current home glucose readings ?fasting glucose: 120-150 (only in the mornings) ?post prandial glucose: 190, 200 ?-Reports hypoglycemic/hyperglycemic symptoms ?-Current meal patterns: reading package labels ?breakfast: did not discuss ?lunch: did not discuss  ?dinner: did not discuss ?snacks: trail mix  ?drinks: not drinking soda at all and sweet tea; diet mt. dew ?-Current exercise: walking a couple times a week, grandsons at the gym on the weekends - has lost a  couple of pounds; cutting grass more ?-Educated on A1c and blood sugar goals; ?Exercise  goal of 150 minutes per week; ?Carbohydrate counting and/or plate method ?-Counseled to check feet daily and get yearly eye exams ?-Counseled on diet and exercise extensively ?Recommended to continue current medication ?Recommended checking other times of the day a few times a week such as after meals or before bedtime ? ?Headaches (Goal: minimize severity of headaches) ?-Controlled ?-Current treatment  ?Excedrin as needed - Appropriate, Effective, Safe, Accessible ?-Medications previously tried: Fioricet  ?-Counseled on risk for medication overuse headache with caffeine component and recommended limiting use. ? ? ?Health Maintenance ?-Vaccine gaps: Prevnar 20 ?-Current therapy:  ?Vitamin B12 1000 mcg daily ?-Educated on Cost vs benefit of each product must be carefully weighed by individual consumer ?-Patient is satisfied with current therapy and denies issues ?-Recommended to continue as is ? ?Patient Goals/Self-Care Activities ?Patient will:  ?- take medications as prescribed ?check glucose daily, document, and provide at future appointments ?check blood pressure weekly, document, and provide at future appointments ?target a minimum of 150 minutes of moderate intensity exercise weekly ?engage in dietary modifications by cutting back on portion sizes ? ?Follow Up Plan: Telephone follow up appointment with care management team member scheduled for: 4 months ?  ?  ? ?Patient verbalizes understanding of instructions and care plan provided today and agrees to view in [place]. Active MyChart status confirmed with patient.   ?The pharmacy team will reach out to the patient again over the next 30 days.  ? ?[name], RPH  ?

## 2022-04-06 NOTE — Progress Notes (Signed)
? ?Chronic Care Management ?Pharmacy Note ? ?04/06/2022 ?Name:  Jason [name] MRN:  [id number] DOB:  [birth year] ? ?Summary: ?A1c not at goal < 7% ?BP not at goal < 130/80 ?  ?Recommendations/Changes made from today's visit: ?-Recommend increasing metformin to 1000 mg BID as prescribed ?-Recommended bringing BP cuff to next office visit to ensure accuracy ? ? ?-Patient is more constipated with Trulicity  ? ?  ?Plan: ?Follow up BP and DM assessment in 1 month ?  ? ?Subjective: ?Jason [name] is an 66 y.o. year old male who is a primary patient of [name], NP.  The CCM team was consulted for assistance with disease management and care coordination needs.   ? ?Engaged with patient by telephone for follow up visit in response to provider referral for pharmacy case management and/or care coordination services.  ? ?Consent to Services:  ?The patient was given information about Chronic Care Management services, agreed to services, and gave verbal consent prior to initiation of services.  Please see initial visit note for detailed documentation.  ? ?Patient Care Team: ?[name], NP as PCP - General (Family Medicine) ?[name], [facility] as Pharmacist (Pharmacist) ? ?Recent office visits: ?01/27/22 [name], NP: Patient presented for DM follow up. A1c increased to 8.3%. Prescribed Trulicity 2.54 mg weekly. ? ?10/22/21 [name], NP: Patient presented for DM follow up. Increased glipizide to 10 mg BID and referred to nutrition. A1c decreased to 7.7%. ? ?09/08/21 Randel Pigg, LPN: Patient presented for AWV. ? ?Recent consult visits: ?01/20/22 [name], RD (nutrition): Patient presented for follow up nutrition counseling. ? ?12/16/2021 [name] MD (neurology) - Patient was seen for a tension headache. No medication changes. Return if symptoms worsen or fail to improve. ? ?11/25/21 [name], RD (nutrition): Patient presented for initial nutrition counseling. ? ?09/06/21 [name],  MD (neurology): Patient presented for headaches. Follow up in 3 months. ? ?09/03/21 [name], MD (pulmonary): Patient presented for initial consult for possible OSA. Schduled for lab polsomnogram. ? ?08/11/21 [name] MD (Infectious Disease) - seen for CSF pleocytosis. No medication changes. No need to follow up. ? ?Hospital visits: ?Medication Reconciliation was completed by comparing discharge summary, patient?s EMR and Pharmacy list, and upon discussion with patient. ?  ?Admitted to the hospital on 07/20/21 due to Acute Encephalopathy. Discharge date was 07/28/21. Discharged from [facility].   ?  ?New?Medications Started at [facility] Discharge:?? ?-started None. ?  ?Medication Changes at Hospital Discharge: ?-Changed None. ?  ?Medications Discontinued at Hospital Discharge: ?-Stopped Ibuprofen. ?  ?Medications that remain the same after Hospital Discharge:??  ?-All other medications will remain the same.   ? ?7/30-07/15/21 Patient admitted for altered mental status. Vitamin B12 low and recommended 1000 mcg daily supplementation. ? ?Objective: ? ?Lab Results  ?Component Value Date  ? CREATININE 1.36 01/27/2022  ? BUN 17 01/27/2022  ? GFR 53.75 (L) 01/27/2022  ? GFRNONAA >60 07/28/2021  ? GFRAA >60 05/25/2011  ? NA 138 01/27/2022  ? K 4.7 01/27/2022  ? CALCIUM 10.2 01/27/2022  ? CO2 32 01/27/2022  ? GLUCOSE 184 (H) 01/27/2022  ? ? ?Lab Results  ?Component Value Date/Time  ? HGBA1C 8.3 (H) 01/27/2022 08:31 AM  ? HGBA1C 8.1 (A) 01/27/2022 08:04 AM  ? HGBA1C 7.7 (A) 10/22/2021 03:02 PM  ? HGBA1C 8.3 (H) 06/17/2021 08:31 AM  ? HGBA1C 9.5 (A) 01/10/2019 08:30 AM  ? GFR 53.75 (L) 01/27/2022 08:31 AM  ? GFR 51.65 (L) 08/03/2021  04:30 PM  ? MICROALBUR 6.6 (H) 06/17/2021 08:31 AM  ? MICROALBUR 3.1 (H) 10/10/2018 08:30 AM  ?  ?Last diabetic Eye exam:  ?Lab Results  ?Component Value Date/Time  ? HMDIABEYEEXA No Retinopathy 10/12/2020 12:00 AM  ?  ?Last diabetic Foot exam: No results found for: HMDIABFOOTEX   ? ?Lab Results  ?Component Value Date  ? CHOL 150 07/10/2021  ? HDL 61 07/10/2021  ? [employer] 79 07/10/2021  ? TRIG 49 07/10/2021  ? CHOLHDL 2.5 07/10/2021  ? ? ? ?  Latest Ref Rng & Units 08/03/2021  ?  4:30 PM 07/28/2021  ?  1:19 AM 07/27/2021  ? 12:22 AM  ?Hepatic Function  ?Total Protein 6.0 - 8.3 g/dL 8.1   6.9   6.7    ?Albumin 3.5 - 5.2 g/dL 4.0   3.1   3.1    ?AST 0 - 37 U/L '20   23   22    ' ?ALT 0 - 53 U/L 36   40   42    ?Alk Phosphatase 39 - 117 U/L 64   50   50    ?Total Bilirubin 0.2 - 1.2 mg/dL 0.3   0.5   0.4    ? ? ?Lab Results  ?Component Value Date/Time  ? TSH 2.119 07/10/2021 03:49 AM  ? TSH 2.92 06/17/2021 08:31 AM  ? TSH 2.96 05/28/2020 07:46 AM  ? FREET4 0.92 05/24/2011 01:59 PM  ? ? ? ?  Latest Ref Rng & Units 08/03/2021  ?  4:30 PM 07/28/2021  ?  1:19 AM 07/27/2021  ? 12:22 AM  ?CBC  ?WBC 4.0 - 10.5 K/uL 6.5   8.1   6.8    ?Hemoglobin 13.0 - 17.0 g/dL 14.0   13.4   13.2    ?Hematocrit 39.0 - 52.0 % 43.0   39.8   38.9    ?Platelets 150.0 - 400.0 K/uL 268.0   173   174    ? ? ?No results found for: VD25OH ? ?Clinical ASCVD: No  ?The 10-year ASCVD risk score (Arnett DK, et al., 2019) is: 27.4% ?  Values used to calculate the score: ?    Age: 66 years ?    Sex: Male ?    Is Non-Hispanic African American: Yes ?    Diabetic: Yes ?    Tobacco smoker: No ?    Systolic Blood Pressure: 143 mmHg ?    Is BP treated: Yes ?    HDL Cholesterol: 61 mg/dL ?    Total Cholesterol: 150 mg/dL   ? ? ?  11/25/2021  ?  2:09 PM 09/08/2021  ?  3:25 PM 09/08/2021  ?  3:23 PM  ?Depression screen PHQ 2/9  ?Decreased Interest 0 0 0  ?Down, Depressed, Hopeless 0 0 0  ?PHQ - 2 Score 0 0 0  ?  ? ? ?Social History  ? ?Tobacco Use  ?Smoking Status Never  ?Smokeless Tobacco Never  ? ?BP Readings from Last 3 Encounters:  ?01/27/22 130/88  ?12/16/21 131/85  ?10/22/21 110/72  ? ?Pulse Readings from Last 3 Encounters:  ?01/27/22 81  ?12/16/21 86  ?10/22/21 78  ? ?Wt Readings from Last 3 Encounters:  ?01/27/22 255 lb (115.7 kg)  ?01/20/22  256 lb 1.6 oz (116.2 kg)  ?12/16/21 255 lb (115.7 kg)  ? ?BMI Readings from Last 3 Encounters:  ?01/27/22 31.87 kg/m?  ?01/20/22 32.01 kg/m?  ?12/16/21 31.87 kg/m?  ? ? ?Assessment/Interventions: Review of patient past medical history,  allergies, medications, health status, including review of consultants reports, laboratory and other test data, was performed as part of comprehensive evaluation and provision of chronic care management services.  ? ?SDOH:  (Social Determinants of Health) assessments and interventions performed: No ? ?SDOH Screenings  ? ?Alcohol Screen: Low Risk   ? Last Alcohol Screening Score (AUDIT): 0  ?Depression (PHQ2-9): Low Risk   ? PHQ-2 Score: 0  ?Financial Resource Strain: Low Risk   ? Difficulty of Paying Living Expenses: Not hard at all  ?Food Insecurity: Not on file  ?Housing: Low Risk   ? Last Housing Risk Score: 0  ?Physical Activity: Sufficiently Active  ? Days of Exercise per Week: 5 days  ? Minutes of Exercise per Session: 50 min  ?Social Connections: Moderately Isolated  ? Frequency of Communication with Friends and Family: Three times a week  ? Frequency of Social Gatherings with Friends and Family: Three times a week  ? Attends Religious Services: More than 4 times per year  ? Active Member of Clubs or Organizations: No  ? Attends Archivist Meetings: Never  ? Marital Status: Widowed  ?Stress: Not on file  ?Tobacco Use: Low Risk   ? Smoking Tobacco Use: Never  ? Smokeless Tobacco Use: Never  ? Passive Exposure: Not on file  ?Transportation Needs: No Transportation Needs  ? Lack of Transportation (Medical): No  ? Lack of Transportation (Non-Medical): No  ? ? ?CCM Care Plan ? ?Allergies  ?Allergen Reactions  ? Lisinopril   ?  hyperkalemia  ? Reglan [Metoclopramide] Anxiety  ?  Patient had bad reaction requiring ativan in addition to benadryl. He doesn't want to receive it any more  ? ? ?Medications Reviewed Today   ? ? Reviewed by Franco Collet, CMA (Certified Medical  Assistant) on 01/27/22 at [id number]  Med List Status: <None>  ? ?Medication Order Taking? Sig Documenting Provider Last Dose Status Informant  ?aspirin-acetaminophen-caffeine ([place]) 250-250-65 MG

## 2022-04-10 DIAGNOSIS — I10 Essential (primary) hypertension: Secondary | ICD-10-CM | POA: Diagnosis not present

## 2022-04-10 DIAGNOSIS — E785 Hyperlipidemia, unspecified: Secondary | ICD-10-CM

## 2022-04-10 DIAGNOSIS — Z7984 Long term (current) use of oral hypoglycemic drugs: Secondary | ICD-10-CM

## 2022-04-10 DIAGNOSIS — Z7985 Long-term (current) use of injectable non-insulin antidiabetic drugs: Secondary | ICD-10-CM

## 2022-04-10 DIAGNOSIS — E1159 Type 2 diabetes mellitus with other circulatory complications: Secondary | ICD-10-CM

## 2022-04-16 ENCOUNTER — Other Ambulatory Visit: Payer: Self-pay | Admitting: Adult Health

## 2022-04-28 ENCOUNTER — Encounter: Payer: Self-pay | Admitting: Adult Health

## 2022-04-28 ENCOUNTER — Ambulatory Visit (INDEPENDENT_AMBULATORY_CARE_PROVIDER_SITE_OTHER): Payer: Medicare Other | Admitting: Adult Health

## 2022-04-28 ENCOUNTER — Ambulatory Visit: Payer: Medicare Other | Admitting: Dietician

## 2022-04-28 VITALS — BP 130/80 | HR 73 | Temp 98.6°F | Ht 75.0 in | Wt 254.0 lb

## 2022-04-28 DIAGNOSIS — I10 Essential (primary) hypertension: Secondary | ICD-10-CM

## 2022-04-28 DIAGNOSIS — E1169 Type 2 diabetes mellitus with other specified complication: Secondary | ICD-10-CM

## 2022-04-28 LAB — POCT GLYCOSYLATED HEMOGLOBIN (HGB A1C): Hemoglobin A1C: 7.8 % — AB (ref 4.0–5.6)

## 2022-04-28 MED ORDER — TRULICITY 1.5 MG/0.5ML ~~LOC~~ SOAJ: 1.5000 mg | SUBCUTANEOUS | 0 refills | Status: AC

## 2022-04-28 NOTE — Progress Notes (Signed)
Subjective:    Patient ID: Jason [name], male    DOB: 1956-05-03, 66 y.o.   MRN: [id number]  HPI 66 year old male who  has a past medical history of Bell palsy, Diabetes mellitus without complication ([place]), and HTN (hypertension).  He presents to the office today for 56-monthfollow-up regarding diabetes and hypertension  Diabetes mellitus type 2-currently managed with metformin 1000 mg BID and Jardiance 25 mg daily. Most recently was placed on Trulicity 08.38mg weekly.  Since starting this medication he reports that his blood sugars have been more stable throughout the day.   He does check his blood sugars periodically at home with readings between 130 and 140. He is eating less as he has a decreased appetite. He is getting outside more and exercising   Wt Readings from Last 3 Encounters:  04/28/22 254 lb (115.2 kg)  01/27/22 255 lb (115.7 kg)  01/20/22 256 lb 1.6 oz (116.2 kg)     Lab Results  Component Value Date   HGBA1C 8.3 (H) 01/27/2022   HTN -takes Cardizem 180 mg daily.  He does not monitor his blood pressure at home.  Denies dizziness, lightheadedness, chest pain, or shortness of breath  Past Medical History:  Diagnosis Date   Bell palsy    Diabetes mellitus without complication ([place]    HTN (hypertension)     Social History   Socioeconomic History   Marital status: Widowed    Spouse name: Not on file   Number of children: 2   Years of education: 12   Highest education level: Not on file  Occupational History   Occupation: transportation    Comment: CPublishing rights manager Tobacco Use   Smoking status: Never   Smokeless tobacco: Never  Vaping Use   Vaping Use: Never used  Substance and Sexual Activity   Alcohol use: Yes    Alcohol/week: 6.0 standard drinks    Types: 6 Cans of beer per week    Comment: on weekends   Drug use: No   Sexual activity: Not on file  Other Topics Concern   Not on file  Social History Narrative      Drivers a taxi  [place]    Two children - both live locally.       Fun: fix cars, drag race    Social Determinants of Health   Financial Resource Strain: Low Risk    Difficulty of Paying Living Expenses: Not hard at all  Food Insecurity: Not on file  Transportation Needs: No Transportation Needs   Lack of Transportation (Medical): No   Lack of Transportation (Non-Medical): No  Physical Activity: Sufficiently Active   Days of Exercise per Week: 5 days   Minutes of Exercise per Session: 50 min  Stress: Not on file  Social Connections: Moderately Isolated   Frequency of Communication with Friends and Family: Three times a week   Frequency of Social Gatherings with Friends and Family: Three times a week   Attends Religious Services: More than 4 times per year   Active Member of Clubs or Organizations: No   Attends CArchivistMeetings: Never   Marital Status: Widowed  IHuman resources officerViolence: Not At Risk   Fear of Current or Ex-Partner: No   Emotionally Abused: No   Physically Abused: No   Sexually Abused: No    Past Surgical History:  Procedure Laterality Date   COLONOSCOPY W/ POLYPECTOMY      Family History  Problem Relation Age of Onset   Stroke Mother    Colon cancer Sister    High blood pressure Sister    Colon cancer Brother    High blood pressure Brother    Stomach cancer Brother    Lymphoma Son    Colon cancer Other        1st degree relative <60   Diabetes Other        1st degree relative    Stroke Other    Esophageal cancer Neg Hx    Rectal cancer Neg Hx     Allergies  Allergen Reactions   Lisinopril     hyperkalemia   Reglan [Metoclopramide] Anxiety    Patient had bad reaction requiring ativan in addition to benadryl. He doesn't want to receive it any more    Current Outpatient Medications on File Prior to Visit  Medication Sig Dispense Refill   aspirin-acetaminophen-caffeine (EXCEDRIN MIGRAINE) 250-250-65 MG tablet Take 1 tablet by mouth  every 6 (six) hours as needed for headache.     Blood Glucose Monitoring Suppl (ONE TOUCH ULTRA 2) w/Device KIT Use the device daily to check your blood sugars 1-4 times as instructed. 1 kit 0   diltiazem (CARDIZEM CD) 120 MG 24 hr capsule TAKE 1 CAPSULE BY MOUTH EVERY DAY 90 capsule 3   empagliflozin (JARDIANCE) 25 MG TABS tablet Take 1 tablet (25 mg total) by mouth daily. 90 tablet 1   glucose blood (ONE TOUCH ULTRA TEST) test strip USE TO TEST BLOOD GLUCOSE TWICE DAILY 200 each 3   Lancets (ONETOUCH ULTRASOFT) lancets Use to check blood sugar BID 100 each 12   Multiple Vitamins-Minerals (CENTRUM SILVER PO) Take 1 tablet by mouth daily.     rosuvastatin (CRESTOR) 5 MG tablet Take 1 tablet (5 mg total) by mouth at bedtime. 90 tablet 3   vitamin B-12 (CYANOCOBALAMIN) 1000 MCG tablet Take 1 tablet (1,000 mcg total) by mouth daily. 30 tablet 0   metFORMIN (GLUCOPHAGE XR) 500 MG 24 hr tablet Take 2 tablets (1,000 mg total) by mouth in the morning and at bedtime. 360 tablet 0   No current facility-administered medications on file prior to visit.    BP 133/88   Pulse 73   Temp 98.6 F (37 C) (Oral)   Ht '6\' 3"'  (1.905 m)   Wt 254 lb (115.2 kg)   SpO2 98%   BMI 31.75 kg/m      Review of Systems See HPI   Past Medical History:  Diagnosis Date   Bell palsy    Diabetes mellitus without complication (HCC)    HTN (hypertension)     Social History   Socioeconomic History   Marital status: Widowed    Spouse name: Not on file   Number of children: 2   Years of education: 12   Highest education level: Not on file  Occupational History   Occupation: transportation    Comment: Publishing rights manager  Tobacco Use   Smoking status: Never   Smokeless tobacco: Never  Vaping Use   Vaping Use: Never used  Substance and Sexual Activity   Alcohol use: Yes    Alcohol/week: 6.0 standard drinks    Types: 6 Cans of beer per week    Comment: on weekends   Drug use: No   Sexual activity:  Not on file  Other Topics Concern   Not on file  Social History Narrative      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       Fun: fix cars, drag race    Social Determinants of Health   Financial Resource Strain: Low Risk    Difficulty of Paying Living Expenses: Not hard at all  Food Insecurity: Not on file  Transportation Needs: No Transportation Needs   Lack of Transportation (Medical): No   Lack of Transportation (Non-Medical): No  Physical Activity: Sufficiently Active   Days of Exercise per Week: 5 days   Minutes of Exercise per Session: 50 min  Stress: Not on file  Social Connections: Moderately Isolated   Frequency of Communication with Friends and Family: Three times a week   Frequency of Social Gatherings with Friends and Family: Three times a week   Attends Religious Services: More than 4 times per year   Active Member of Clubs or Organizations: No   Attends Archivist Meetings: Never   Marital Status: Widowed  Human resources officer Violence: Not At Risk   Fear of Current or Ex-Partner: No   Emotionally Abused: No   Physically Abused: No   Sexually Abused: No    Past Surgical History:  Procedure Laterality Date   COLONOSCOPY W/ POLYPECTOMY      Family History  Problem Relation Age of Onset   Stroke Mother    Colon cancer Sister    High blood pressure Sister    Colon cancer Brother    High blood pressure Brother    Stomach cancer Brother    Lymphoma Son    Colon cancer Other        1st degree relative <60   Diabetes Other        1st degree relative    Stroke Other    Esophageal cancer Neg Hx    Rectal cancer Neg Hx     Allergies  Allergen Reactions   Lisinopril     hyperkalemia   Reglan [Metoclopramide] Anxiety    Patient had bad reaction requiring ativan in addition to benadryl. He doesn't want to receive it any more    Current Outpatient Medications on File Prior to Visit  Medication Sig Dispense Refill    aspirin-acetaminophen-caffeine (EXCEDRIN MIGRAINE) 250-250-65 MG tablet Take 1 tablet by mouth every 6 (six) hours as needed for headache.     Blood Glucose Monitoring Suppl (ONE TOUCH ULTRA 2) w/Device KIT Use the device daily to check your blood sugars 1-4 times as instructed. 1 kit 0   diltiazem (CARDIZEM CD) 120 MG 24 hr capsule TAKE 1 CAPSULE BY MOUTH EVERY DAY 90 capsule 3   empagliflozin (JARDIANCE) 25 MG TABS tablet Take 1 tablet (25 mg total) by mouth daily. 90 tablet 1   glipiZIDE (GLUCOTROL XL) 10 MG 24 hr tablet TAKE 1 TABLET BY MOUTH TWICE A DAY 180 tablet 0   glucose blood (ONE TOUCH ULTRA TEST) test strip USE TO TEST BLOOD GLUCOSE TWICE DAILY 200 each 3   Lancets (ONETOUCH ULTRASOFT) lancets Use to check blood sugar BID 100 each 12   Multiple Vitamins-Minerals (CENTRUM SILVER PO) Take 1 tablet by mouth daily.     rosuvastatin (CRESTOR) 5 MG tablet Take 1 tablet (5 mg total) by mouth at bedtime. 90 tablet 3   TRULICITY 5.95 GL/8.7FI SOPN INJECT 0.75 MG INTO THE SKIN ONCE A WEEK. 2 mL 2   vitamin B-12 (CYANOCOBALAMIN) 1000 MCG tablet Take 1 tablet (1,000 mcg total) by mouth daily. 30 tablet 0   metFORMIN (GLUCOPHAGE XR) 500 MG 24 hr tablet  Take 2 tablets (1,000 mg total) by mouth in the morning and at bedtime. 360 tablet 0   No current facility-administered medications on file prior to visit.    BP 133/88   Pulse 73   Temp 98.6 F (37 C) (Oral)   Ht '6\' 3"'  (1.905 m)   Wt 254 lb (115.2 kg)   SpO2 98%   BMI 31.75 kg/m       Objective:   Physical Exam Vitals and nursing note reviewed.  Constitutional:      Appearance: Normal appearance.  Cardiovascular:     Rate and Rhythm: Normal rate and regular rhythm.     Pulses: Normal pulses.     Heart sounds: Normal heart sounds.  Pulmonary:     Effort: Pulmonary effort is normal.     Breath sounds: Normal breath sounds.  Skin:    General: Skin is warm and dry.  Neurological:     General: No focal deficit present.      Mental Status: He is alert and oriented to person, place, and time.  Psychiatric:        Mood and Affect: Mood normal.        Behavior: Behavior normal.        Thought Content: Thought content normal.       Assessment & Plan:  1. Type 2 diabetes mellitus with other specified complication, without long-term current use of insulin (HCC)  - POC HgB A1c- 7.8 - is improving. Will increase Trulicity to 1.5 mg weekly.  - Follow up in 3 months  - 5 boxes of Jardiance 25 mg samples were given to patient.   - Dulaglutide (TRULICITY) 1.5 VD/4.7VE SOPN; Inject 1.5 mg into the skin once a week.  Dispense: 2 mL; Refill: 02 - Follow up in 62month for CPE   2. Essential hypertension - Well controlled.  - No change in medications   CDorothyann Peng NP

## 2022-04-28 NOTE — Patient Instructions (Addendum)
Your A1c has improved to 7.8   Lets follow up in 3 month for your physical exam

## 2022-05-05 ENCOUNTER — Encounter: Payer: Self-pay | Admitting: *Deleted

## 2022-05-26 ENCOUNTER — Encounter: Payer: Self-pay | Admitting: Dietician

## 2022-05-26 ENCOUNTER — Encounter: Payer: Medicare Other | Attending: Adult Health | Admitting: Dietician

## 2022-05-26 DIAGNOSIS — E1169 Type 2 diabetes mellitus with other specified complication: Secondary | ICD-10-CM | POA: Insufficient documentation

## 2022-05-26 NOTE — Patient Instructions (Addendum)
Consistency with meals.  Portion control. Avoid eating snacks when you aren't hungry. Choose beverages with zero sugar/carbohydrates  Mindfulness:  Consistently scheduled meal - avoid skipping  Choices  Eat slowly  Away from distraction (sitting in kitchen or dining room)  Stop eating when satisfied  Before a snack ask, "Am I hungry or eating for another reason?"   "What can I do instead if I am not hungry?"  Try to find something every day that brings you joy!  Slowly add the walking back.

## 2022-05-26 NOTE — Progress Notes (Signed)
Diabetes Self-Management Education  Visit Type: Follow-up  Appt. Start Time: 1535 Appt. End Time: 6270  05/26/2022  [name], identified by name and date of birth, is a 66 y.o. male with a diagnosis of Diabetes:  .   ASSESSMENT Patient is here today alone. He was last seen by another RD in our office on 01/20/2022.  Fasting blood glucose 125-146 "Bad day this week - cheating" Forgets to check after meals. 210 glucose in office today.  Ate lunch 3.5 hours ago. Weight 257 lbs today and patient states he had lost to 241 lbs after his February appointment but regained this.  History includes Type 2 Diabetes, HTN Medications noted and to include Trulicity, Jardiance, and Metformin XR Labs noted to include A1C 7.8% 04/28/2022 decreased from 8.3% 01/27/2022 and GFR of 53 01/27/2022  Patient lives with a friend.  They share shopping and cooking. He works as a Dispensing optician. Eats lunch on the road. Pain in hip and unable to walk at this time.  He does yard work and stays active.  Weight 257 lb (116.6 kg). Body mass index is 32.12 kg/m.   Diabetes Self-Management Education - 05/26/22 1644       Visit Information   Visit Type Follow-up      Psychosocial Assessment   Other persons present Patient      Pre-Education Assessment   Patient understands the diabetes disease and treatment process. Needs Review    Patient understands incorporating nutritional management into lifestyle. Comprehends key points    Patient undertands incorporating physical activity into lifestyle. Needs Review    Patient understands using medications safely. Comprehends key points    Patient understands monitoring blood glucose, interpreting and using results Needs Review    Patient understands prevention, detection, and treatment of acute complications. Comprehends key points    Patient understands prevention, detection, and treatment of chronic complications. Compreheands key points    Patient  understands how to develop strategies to address psychosocial issues. Comprehends key points    Patient understands how to develop strategies to promote health/change behavior. Needs Review      Complications   Last HgB A1C per patient/outside source 7.8 %   04/26/2022 decreased from 8.3% 01/27/2022   How often do you check your blood sugar? 1-2 times/day    Fasting Blood glucose range (mg/dL) 130-179;70-129    Postprandial Blood glucose range (mg/dL) >200    Number of hypoglycemic episodes per month 0    Number of hyperglycemic episodes ( >'200mg'$ /dL): Daily      Dietary Intake   Breakfast cereal (hoey bunches of oats witha lmonds), 2% milk, occasional banana OR waffle sugar free syrup, sausage    Lunch fast food chicken or fish sandwich or burger and fries OR thick crust pizza    Dinner chicken, beef, pork, or crab legs, vegetables, mashed potatoes, corn or beans    Snack (evening) ice cream or honey bun   before 8   Beverage(s) water, diet soda, half and half tea, coffee with 2 sp sugar      Activity / Exercise   Activity / Exercise Type ADL's   pain in hip     Patient Education   Previous Diabetes Education Yes (please comment)   01/2022 last   Healthy Eating Meal options for control of blood glucose level and chronic complications.;Food label reading, portion sizes and measuring food.;Information on hints to eating out and maintain blood glucose control.    Being Active Helped  patient identify appropriate exercises in relation to his/her diabetes, diabetes complications and other health issue.    Medications Reviewed patients medication for diabetes, action, purpose, timing of dose and side effects.    Monitoring Identified appropriate SMBG and/or A1C goals.    Diabetes Stress and Support Worked with patient to identify barriers to care and solutions;Role of stress on diabetes      Individualized Goals (developed by patient)   Nutrition General guidelines for healthy choices and  portions discussed    Physical Activity Exercise 3-5 times per week;15 minutes per day    Medications take my medication as prescribed    Monitoring  Test my blood glucose as discussed    Problem Solving Eating Pattern;Addressing barriers to behavior change      Patient Self-Evaluation of Goals - Patient rates self as meeting previously set goals (% of time)   Nutrition 50 - 75 % (half of the time)    Physical Activity < 25% (hardly ever/never)    Medications >75% (most of the time)    Monitoring >75% (most of the time)    Problem Solving and behavior change strategies  50 - 75 % (half of the time)    Reducing Risk (treating acute and chronic complications) >46% (most of the time)    Health Coping >75% (most of the time)      Post-Education Assessment   Patient understands the diabetes disease and treatment process. Comprehends key points    Patient understands incorporating nutritional management into lifestyle. Comprehends key points    Patient undertands incorporating physical activity into lifestyle. Comprehends key points    Patient understands using medications safely. Comphrehends key points    Patient understands monitoring blood glucose, interpreting and using results Comprehends key points    Patient understands prevention, detection, and treatment of acute complications. Comprehends key points    Patient understands prevention, detection, and treatment of chronic complications. Comprehends key points    Patient understands how to develop strategies to address psychosocial issues. Comprehends key points    Patient understands how to develop strategies to promote health/change behavior. Comprehends key points      Outcomes   Expected Outcomes Demonstrated interest in learning but significant barriers to change    Future DMSE 2 months    Program Status Not Completed      Subsequent Visit   Since your last visit have you continued or begun to take your medications as  prescribed? Yes    Since your last visit have you experienced any weight changes? No change    Since your last visit, are you checking your blood glucose at least once a day? Yes             Individualized Plan for Diabetes Self-Management Training:   Learning Objective:  Patient will have a greater understanding of diabetes self-management. Patient education plan is to attend individual and/or group sessions per assessed needs and concerns.   Plan:   Patient Instructions  Consistency with meals.  Portion control. Avoid eating snacks when you aren't hungry. Choose beverages with zero sugar/carbohydrates  Mindfulness:  Consistently scheduled meal - avoid skipping  Choices  Eat slowly  Away from distraction (sitting in kitchen or dining room)  Stop eating when satisfied  Before a snack ask, "Am I hungry or eating for another reason?"   "What can I do instead if I am not hungry?"  Try to find something every day that brings you joy!  Slowly add the  walking back.   Expected Outcomes:  Demonstrated interest in learning but significant barriers to change  Education material provided:   If problems or questions, patient to contact team via:  Phone  Future DSME appointment: 2 months-3 months

## 2022-06-09 ENCOUNTER — Encounter: Payer: Self-pay | Admitting: Gastroenterology

## 2022-06-10 ENCOUNTER — Ambulatory Visit (INDEPENDENT_AMBULATORY_CARE_PROVIDER_SITE_OTHER): Payer: Medicare Other | Admitting: Adult Health

## 2022-06-10 ENCOUNTER — Ambulatory Visit (INDEPENDENT_AMBULATORY_CARE_PROVIDER_SITE_OTHER): Payer: Medicare Other

## 2022-06-10 VITALS — BP 122/70 | HR 95 | Temp 99.2°F | Ht 75.0 in | Wt 253.0 lb

## 2022-06-10 DIAGNOSIS — G8929 Other chronic pain: Secondary | ICD-10-CM

## 2022-06-10 DIAGNOSIS — M545 Low back pain, unspecified: Secondary | ICD-10-CM | POA: Diagnosis not present

## 2022-06-10 DIAGNOSIS — M25551 Pain in right hip: Secondary | ICD-10-CM

## 2022-06-10 MED ORDER — TRULICITY 1.5 MG/0.5ML ~~LOC~~ SOAJ: 1.5000 mg | SUBCUTANEOUS | 2 refills | Status: AC

## 2022-06-10 NOTE — Progress Notes (Signed)
Subjective:    Patient ID: Jason [name], male    DOB: [birth year], 66 y.o.   MRN: [id number]  HPI 66 year old male who  has a past medical history of Bell palsy, Diabetes mellitus without complication ([place]), and HTN (hypertension).  He presents to the office today for right hip pain that has been present for about 2 -3 months.  He reports that his discomfort got worse but over the last week it seems to be improving to some degree.  He also reports chronic low back pain but after he stretches for a few days the back pain goes away.  The right hip pain is located along the outside of his right hip.  It can radiate into his groin and sometimes radiates down outside of his right leg and stops at the knee.  Pain is worse with standing and walking for long periods of time.  Sitting down alleviates the pain.  Does not feel as though his hip catches or slips with walking.  Review of Systems See HPI   Past Medical History:  Diagnosis Date   Bell palsy    Diabetes mellitus without complication (HCC)    HTN (hypertension)     Social History   Socioeconomic History   Marital status: Widowed    Spouse name: Not on file   Number of children: 2   Years of education: 12   Highest education level: Not on file  Occupational History   Occupation: transportation    Comment: Publishing rights manager  Tobacco Use   Smoking status: Never   Smokeless tobacco: Never  Vaping Use   Vaping Use: Never used  Substance and Sexual Activity   Alcohol use: Yes    Alcohol/week: 6.0 standard drinks of alcohol    Types: 6 Cans of beer per week    Comment: on weekends   Drug use: No   Sexual activity: Not on file  Other Topics Concern   Not on file  Social History Narrative      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       Fun: fix cars, drag race    Social Determinants of Health   Financial Resource Strain: Low Risk  (09/08/2021)   Overall Financial Resource  Strain (CARDIA)    Difficulty of Paying Living Expenses: Not hard at all  Food Insecurity: No Food Insecurity (09/07/2020)   Hunger Vital Sign    Worried About Running Out of Food in the Last Year: Never true    Ran Out of Food in the Last Year: Never true  Transportation Needs: No Transportation Needs (09/08/2021)   PRAPARE - Hydrologist (Medical): No    Lack of Transportation (Non-Medical): No  Physical Activity: Sufficiently Active (09/08/2021)   Exercise Vital Sign    Days of Exercise per Week: 5 days    Minutes of Exercise per Session: 50 min  Stress: No Stress Concern Present (09/07/2020)   [place]    Feeling of Stress : Not at all  Social Connections: Moderately Isolated (09/08/2021)   Social Connection and Isolation Panel [NHANES]    Frequency of Communication with Friends and Family: Three times a week    Frequency of Social Gatherings with Friends and Family: Three times a week    Attends Religious Services: More than 4 times per year    Active Member of  Clubs or Organizations: No    Attends Archivist Meetings: Never    Marital Status: Widowed  Intimate Partner Violence: Not At Risk (09/08/2021)   Humiliation, Afraid, Rape, and Kick questionnaire    Fear of Current or Ex-Partner: No    Emotionally Abused: No    Physically Abused: No    Sexually Abused: No    Past Surgical History:  Procedure Laterality Date   COLONOSCOPY W/ POLYPECTOMY      Family History  Problem Relation Age of Onset   Stroke Mother    Colon cancer Sister    High blood pressure Sister    Colon cancer Brother    High blood pressure Brother    Stomach cancer Brother    Lymphoma Son    Colon cancer Other        1st degree relative <60   Diabetes Other        1st degree relative    Stroke Other    Esophageal cancer Neg Hx    Rectal cancer Neg Hx     Allergies  Allergen Reactions    Lisinopril     hyperkalemia   Reglan [Metoclopramide] Anxiety    Patient had bad reaction requiring ativan in addition to benadryl. He doesn't want to receive it any more    Current Outpatient Medications on File Prior to Visit  Medication Sig Dispense Refill   aspirin-acetaminophen-caffeine (EXCEDRIN MIGRAINE) 250-250-65 MG tablet Take 1 tablet by mouth every 6 (six) hours as needed for headache.     Blood Glucose Monitoring Suppl (ONE TOUCH ULTRA 2) w/Device KIT Use the device daily to check your blood sugars 1-4 times as instructed. 1 kit 0   diltiazem (CARDIZEM CD) 120 MG 24 hr capsule TAKE 1 CAPSULE BY MOUTH EVERY DAY 90 capsule 3   empagliflozin (JARDIANCE) 25 MG TABS tablet Take 1 tablet (25 mg total) by mouth daily. 90 tablet 1   glucose blood (ONE TOUCH ULTRA TEST) test strip USE TO TEST BLOOD GLUCOSE TWICE DAILY 200 each 3   Lancets (ONETOUCH ULTRASOFT) lancets Use to check blood sugar BID 100 each 12   Multiple Vitamins-Minerals (CENTRUM SILVER PO) Take 1 tablet by mouth daily.     rosuvastatin (CRESTOR) 5 MG tablet Take 1 tablet (5 mg total) by mouth at bedtime. 90 tablet 3   TRULICITY 9.67 RF/1.6BW SOPN Inject 0.75 mg into the skin once a week.     vitamin B-12 (CYANOCOBALAMIN) 1000 MCG tablet Take 1 tablet (1,000 mcg total) by mouth daily. 30 tablet 0   metFORMIN (GLUCOPHAGE XR) 500 MG 24 hr tablet Take 2 tablets (1,000 mg total) by mouth in the morning and at bedtime. 360 tablet 0   No current facility-administered medications on file prior to visit.    BP 122/70   Pulse 95   Temp 99.2 F (37.3 C) (Oral)   Ht '6\' 3"'  (1.905 m)   Wt 253 lb (114.8 kg)   SpO2 97%   BMI 31.62 kg/m       Objective:   Physical Exam Vitals and nursing note reviewed.  Constitutional:      Appearance: Normal appearance.  Cardiovascular:     Rate and Rhythm: Normal rate and regular rhythm.     Pulses: Normal pulses.     Heart sounds: Normal heart sounds.  Pulmonary:     Effort:  Pulmonary effort is normal.     Breath sounds: Normal breath sounds.  Musculoskeletal:     Right  hip: No deformity, tenderness or crepitus. Decreased range of motion. Normal strength.     Comments: Had discomfort in the right hip joint with knee-to-chest, internal and external rotation.  Skin:    Capillary Refill: Capillary refill takes less than 2 seconds.  Neurological:     General: No focal deficit present.     Mental Status: He is alert and oriented to person, place, and time.  Psychiatric:        Mood and Affect: Mood normal.        Behavior: Behavior normal.        Thought Content: Thought content normal.       Assessment & Plan:  1. Right hip pain -Likely arthritic.  Does not seem to be bursitis or labral tear.  We will get x-ray of the hip and consider physical therapy in the future - DG Hip Unilat W OR W/O Pelvis 2-3 Views Right; Future  2. Chronic midline low back pain without sciatica  - DG Lumbar Spine Complete; Future  [name], NP

## 2022-06-17 ENCOUNTER — Other Ambulatory Visit: Payer: Self-pay

## 2022-06-17 DIAGNOSIS — M1990 Unspecified osteoarthritis, unspecified site: Secondary | ICD-10-CM

## 2022-06-21 ENCOUNTER — Encounter: Payer: Self-pay | Admitting: Surgery

## 2022-06-21 ENCOUNTER — Ambulatory Visit: Payer: Medicare Other | Admitting: Surgery

## 2022-06-21 DIAGNOSIS — M1611 Unilateral primary osteoarthritis, right hip: Secondary | ICD-10-CM

## 2022-06-21 NOTE — Progress Notes (Signed)
Office Visit Note   Patient: Jason [name]           Date of Birth: [birth year]           MRN: [id number] Visit Date: 06/21/2022              Requested by: [name], NP [address] [place],  [place] [address] PCP: [name], NP   Assessment & Plan: Visit Diagnoses:  1. Unilateral primary osteoarthritis, right hip     Plan: I reviewed right hip x-rays with patient today.  He does have end-stage DJD but he has been trying to function with what he has.  I do think it is reasonable to try diagnostic/therapeutic right hip intra-articular Marcaine/Depo-Medrol injection with Dr. Ernestina Patches.  I will like patient to follow-up with me 1 week after injection has been done.  If he does not have good relief he knows that it may come down to him needing definitive treatment with total hip replacement.  Follow-Up Instructions: Return in about 4 weeks (around 07/19/2022) for with Khaniya Tenaglia recheck right hip.   Orders:  Orders Placed This Encounter  Procedures   Ambulatory referral to Physical Medicine Rehab   No orders of the defined types were placed in this encounter.     Procedures: No procedures performed   Clinical Data: No additional findings.   Subjective: Chief Complaint  Patient presents with   Lower Back - Pain   Right Hip - Pain    HPI 66 year old black male comes in today with complaints of right hip pain x2 months.  He has been referred by his primary care provider.  He states that he has pain in the right groin with activity.  This is not significantly limiting his activity.  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  PCP ordered right hip x-rays June 10, 2022 and that report showed:  CLINICAL DATA:  Right hip pain for 2 months.   EXAM: DG HIP (WITH OR WITHOUT PELVIS) 2-3V RIGHT   COMPARISON:  None Available.   FINDINGS: Both hips are normally located. Age advanced right hip joint degenerative changes with significant  superolateral joint space narrowing and mild osteophytic spurring. Left significant changes on the left. No fracture plain film evidence of avascular necrosis. The pubic symphysis and SI joints are intact. No pelvic fractures or bone lesions.   IMPRESSION: Age advanced right hip joint degenerative changes. No acute bony findings.     Electronically Signed   By: Marijo Sanes M.D.   On: 06/12/2022 14:47     Review of Systems No current cardiopulmonary GI/GU issues  Objective: Vital Signs: There were no vitals taken for this visit.  Physical Exam HENT:     Head: Normocephalic and atraumatic.     Nose: Nose normal.  Pulmonary:     Effort: No respiratory distress.  Musculoskeletal:     Comments: Gait is somewhat antalgic.  Right hip he has groin pain with about 5 to 10 degrees internal rotation.  20-30 degrees external rotation without discomfort.  Negative straight leg raise.  No lumbar paraspinal tenderness.  No focal motor deficits  Neurological:     Mental Status: He is alert and oriented to person, place, and time.     Ortho Exam  Specialty Comments:  No specialty comments available.  Imaging: No results found.   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   Tick bite 07/20/2021   AMS (altered mental status) 07/10/2021   Marijuana abuse 07/10/2021   Class 1 obesity due to excess calories with body mass index (BMI) of 30.0 to 30.9 in adult 07/10/2021   Routine general medical examination at a health care facility 02/02/2016   Type 2 diabetes mellitus ([place]) 06/12/2012   Syncope - neurally mediated 07/04/2011   Essential hypertension 12/24/2007   HYPERGLYCEMIA 12/24/2007   Past Medical History:  Diagnosis Date   Bell palsy    Diabetes mellitus without complication (HCC)    HTN  (hypertension)     Family History  Problem Relation Age of Onset   Stroke Mother    Colon cancer Sister    High blood pressure Sister    Colon cancer Brother    High blood pressure Brother    Stomach cancer Brother    Lymphoma Son    Colon cancer Other        1st degree relative <60   Diabetes Other        1st degree relative    Stroke Other    Esophageal cancer Neg Hx    Rectal cancer Neg Hx     Past Surgical History:  Procedure Laterality Date   COLONOSCOPY W/ POLYPECTOMY     Social History   Occupational History   Occupation: transportation    Comment: Publishing rights manager  Tobacco Use   Smoking status: Never   Smokeless tobacco: Never  Vaping Use   Vaping Use: Never used  Substance and Sexual Activity   Alcohol use: Yes    Alcohol/week: 6.0 standard drinks of alcohol    Types: 6 Cans of beer per week    Comment: on weekends   Drug use: No   Sexual activity: Not on file

## 2022-07-05 ENCOUNTER — Telehealth: Payer: Self-pay | Admitting: Pharmacist

## 2022-07-05 NOTE — Chronic Care Management (AMB) (Signed)
Chronic Care Management Pharmacy Assistant   Name: Jason [name]  MRN: [id number] DOB: 1956-11-05  Reason for Encounter: Disease State / Diabetes Assessment Call   Conditions to be addressed/monitored: DMII  Recent office visits:  06/10/2022 Jason Frees NP - Patient was seen for Right hip pain and an additional issue. Increased Trulicily to 1.5 mg SQ weekly. No follow up noted.   04/28/2022 Jason Frees NP - Patient was seen for Type 2 diabetes mellitus with other specified complication, without long-term current use of insulin and essential hypertension. Increased Trulicily to 1.5 mg SQ weekly. Discontinued Glipizide. Follow up in 3 months.   Recent consult visits:  06/21/2022 Jason Kief PA-C (ortho) - Patient was seen for Unilateral primary osteoarthritis, right hip.  Referral to Physical Medicine and Rehab. No medication changes. Follow up in 4 weeks.   05/26/2022 Jason [name] RD (nutrition) - Patient was seen for Type 2 diabetes mellitus with other specified complication, without long-term current use of insulin. No medication changes. Follow up in 2-3 months.   Hospital visits:  none  Medications: Outpatient Encounter Medications as of 07/05/2022  Medication Sig   aspirin-acetaminophen-caffeine (EXCEDRIN MIGRAINE) 250-250-65 MG tablet Take 1 tablet by mouth every 6 (six) hours as needed for headache.   Blood Glucose Monitoring Suppl (ONE TOUCH ULTRA 2) w/Device KIT Use the device daily to check your blood sugars 1-4 times as instructed.   diltiazem (CARDIZEM CD) 120 MG 24 hr capsule TAKE 1 CAPSULE BY MOUTH EVERY DAY   Dulaglutide (TRULICITY) 1.5 MG/0.5ML SOPN Inject 1.5 mg into the skin once a week.   empagliflozin (JARDIANCE) 25 MG TABS tablet Take 1 tablet (25 mg total) by mouth daily.   glucose blood (ONE TOUCH ULTRA TEST) test strip USE TO TEST BLOOD GLUCOSE TWICE DAILY   Lancets (ONETOUCH ULTRASOFT) lancets Use to check blood sugar BID   metFORMIN (GLUCOPHAGE XR)  500 MG 24 hr tablet Take 2 tablets (1,000 mg total) by mouth in the morning and at bedtime.   Multiple Vitamins-Minerals (CENTRUM SILVER PO) Take 1 tablet by mouth daily.   rosuvastatin (CRESTOR) 5 MG tablet Take 1 tablet (5 mg total) by mouth at bedtime.   vitamin B-12 (CYANOCOBALAMIN) 1000 MCG tablet Take 1 tablet (1,000 mcg total) by mouth daily.   No facility-administered encounter medications on file as of 07/05/2022.  Fill History: DILTIAZEM 24H ER(CD) 120 MG CP 04/26/2022 90   TRULICITY  0.75 MG/0.5ML SOPN 06/15/2022 28   JARDIANCE  25 MG TABS 05/22/2022 30   METFORMIN HCL ER 500 MG TABLET 06/09/2022 90   ROSUVASTATIN CALCIUM 5 MG TAB 04/22/2022 90   Recent Relevant Labs: Lab Results  Component Value Date/Time   HGBA1C 7.8 (A) 04/28/2022 08:38 AM   HGBA1C 8.3 (H) 01/27/2022 08:31 AM   HGBA1C 8.1 (A) 01/27/2022 08:04 AM   HGBA1C 8.3 (H) 06/17/2021 08:31 AM   HGBA1C 9.5 (A) 01/10/2019 08:30 AM   MICROALBUR 6.6 (H) 06/17/2021 08:31 AM   MICROALBUR 3.1 (H) 10/10/2018 08:30 AM    Kidney Function Lab Results  Component Value Date/Time   CREATININE 1.36 01/27/2022 08:31 AM   CREATININE 1.41 08/03/2021 04:30 PM   GFR 53.75 (L) 01/27/2022 08:31 AM   GFRNONAA >60 07/28/2021 09:16 AM   GFRAA >60 05/25/2011 04:49 AM    Current antihyperglycemic regimen:  Trulicity 1.5 mg sq weekly Jardiance 25 mg daily Metformin 500 mg 2 tablets twice daily  What recent interventions/DTPs have been made to improve glycemic control:  Increased Trulicily to 1.5 mg SQ weekly. Discontinued Glipizide.  Have there been any recent hospitalizations or ED visits since last visit with CPP? No recent hospital visits.   Patient denies hypoglycemic symptoms, including None  Patient denies hyperglycemic symptoms, including none  How often are you checking your blood sugar? Patient states he tries to check his blood sugars every morning.   What are your blood sugars ranging?  Fasting: Patient states  he checks fasting blood sugars, they are always between 117 - 125, his blood sugar reading yesterday was 119  During the week, how often does your blood glucose drop below 70? Patient states his blood sugars never go below 70.   Are you checking your feet daily/regularly? Patient states he checks his feet daily.   Adherence Review: Is the patient currently on a STATIN medication? Yes Is the patient currently on ACE/ARB medication? No Does the patient have >5 day gap between last estimated fill dates? No  Care Gaps: AWV - previous message sent to Carrie Mew Last BP - 122/70 on 06/10/2022 Last A1C - 8.3 on 01/27/2022 Pneumonia vaccine - overdue Covid booster - overdue Shingrix - overdue Colonoscopy - overdue Foot exam - overdue Malb - overdue   Star Rating Drugs: Trulicity 1.5 mg - last filled 06/15/2022 28 [facility] Jardiance 25 mg - last filled 05/22/2022 30 [facility] Rosuvastatin 5 mg - last filled 04/22/2022 90 [facility] Metformin 500 mg - last filled 06/09/2022 90 [facility]  Inetta Fermo [facility]  Clinical Pharmacist Assistant [phone]

## 2022-07-07 ENCOUNTER — Other Ambulatory Visit: Payer: Self-pay | Admitting: Adult Health

## 2022-07-07 DIAGNOSIS — E1169 Type 2 diabetes mellitus with other specified complication: Secondary | ICD-10-CM

## 2022-07-10 ENCOUNTER — Other Ambulatory Visit: Payer: Self-pay | Admitting: Adult Health

## 2022-07-10 DIAGNOSIS — E782 Mixed hyperlipidemia: Secondary | ICD-10-CM

## 2022-07-11 ENCOUNTER — Ambulatory Visit: Payer: Medicare Other | Admitting: Physical Medicine and Rehabilitation

## 2022-07-11 ENCOUNTER — Encounter: Payer: Self-pay | Admitting: Physical Medicine and Rehabilitation

## 2022-07-11 ENCOUNTER — Ambulatory Visit: Payer: Self-pay

## 2022-07-11 DIAGNOSIS — M25551 Pain in right hip: Secondary | ICD-10-CM

## 2022-07-11 MED ORDER — BUPIVACAINE HCL 0.25 % IJ SOLN
4.0000 mL | INTRAMUSCULAR | Status: AC | PRN
  Administered 2022-07-11: 4 mL via INTRA_ARTICULAR

## 2022-07-11 MED ORDER — TRIAMCINOLONE ACETONIDE 40 MG/ML IJ SUSP
60.0000 mg | INTRAMUSCULAR | Status: AC | PRN
  Administered 2022-07-11: 60 mg via INTRA_ARTICULAR

## 2022-07-11 NOTE — Progress Notes (Signed)
   Jason [name] - 66 y.o. male MRN [id number]  Date of birth: [birth year]  Office Visit Note: Visit Date: 07/11/2022 PCP: [name], NP Referred by: [name], NP  Subjective: Chief Complaint  Patient presents with   Right Hip - Pain   HPI:  Jason [name] is a 66 y.o. male who comes in today at the request of Benjiman Core, PA-C for planned Right anesthetic hip arthrogram with fluoroscopic guidance.  The patient has failed conservative care including home exercise, medications, time and activity modification.  This injection will be diagnostic and hopefully therapeutic.  Please see requesting physician notes for further details and justification.   ROS Otherwise per HPI.  Assessment & Plan: Visit Diagnoses:    ICD-10-CM   1. Pain in right hip  M25.551 XR C-ARM NO REPORT      Plan: No additional findings.   Meds & Orders: No orders of the defined types were placed in this encounter.   Orders Placed This Encounter  Procedures   Large Joint Inj: R hip joint   XR C-ARM NO REPORT    Follow-up: Return in about 1 week (around 07/18/2022) for Benjiman Core, PA-C.   Procedures: Large Joint Inj: R hip joint on 07/11/2022 8:16 AM Indications: diagnostic evaluation and pain Details: 22 G 3.5 in needle, fluoroscopy-guided anterior approach  Arthrogram: No  Medications: 4 mL bupivacaine 0.25 %; 60 mg triamcinolone acetonide 40 MG/ML Outcome: tolerated well, no immediate complications  There was excellent flow of contrast producing a partial arthrogram of the hip. The patient did have relief of symptoms during the anesthetic phase of the injection. Procedure, treatment alternatives, risks and benefits explained, specific risks discussed. Consent was given by the patient. Immediately prior to procedure a time out was called to verify the correct patient, procedure, equipment, support staff and site/side marked as required. Patient was prepped and draped in the usual sterile  fashion.          Clinical History: No specialty comments available.     Objective:  VS:  HT:    WT:   BMI:     BP:   HR: bpm  TEMP: ( )  RESP:  Physical Exam   Imaging: XR C-ARM NO REPORT  Result Date: 07/11/2022 Please see Notes tab for imaging impression.

## 2022-07-11 NOTE — Progress Notes (Signed)
Pt state right hip pain. Pt state walking makes the pain worse. Pt state he takes over the counter pain meds to help ease his pain.  Numeric Pain Rating Scale and Functional Assessment Average Pain 5   In the last MONTH (on 0-10 scale) has pain interfered with the following?  1. General activity like being  able to carry out your everyday physical activities such as walking, climbing stairs, carrying groceries, or moving a chair?  Rating(10)   +Driver, -BT, -Dye Allergies.

## 2022-07-21 ENCOUNTER — Ambulatory Visit: Payer: Medicare Other | Admitting: Surgery

## 2022-07-21 ENCOUNTER — Other Ambulatory Visit: Payer: Self-pay | Admitting: Adult Health

## 2022-07-21 ENCOUNTER — Encounter: Payer: Self-pay | Admitting: Surgery

## 2022-07-21 DIAGNOSIS — M1611 Unilateral primary osteoarthritis, right hip: Secondary | ICD-10-CM | POA: Diagnosis not present

## 2022-07-21 DIAGNOSIS — I10 Essential (primary) hypertension: Secondary | ICD-10-CM

## 2022-07-21 NOTE — Progress Notes (Signed)
66 year old black male history of end-stage DJD right hip returns for recheck after diagnostic/therapeutic right hip intra-articular injection.  Injection with Dr. Ernestina Patches July 11, 2022 gave him excellent relief.  He is very pleased at this point.  Exam Very pleasant black male alert and oriented in no acute distress.  No pain with internal/external rotation right hip.  Plan Patient will follow-up me in 3 months for recheck.  Return sooner if needed.  Again he understands that ultimately may come down to him needing definitive treatment with right total hip replacement.

## 2022-07-29 ENCOUNTER — Encounter: Payer: Self-pay | Admitting: Adult Health

## 2022-07-29 ENCOUNTER — Ambulatory Visit (INDEPENDENT_AMBULATORY_CARE_PROVIDER_SITE_OTHER): Payer: Medicare Other | Admitting: Adult Health

## 2022-07-29 VITALS — BP 138/98 | HR 73 | Temp 98.2°F | Ht 74.0 in | Wt 242.0 lb

## 2022-07-29 DIAGNOSIS — E782 Mixed hyperlipidemia: Secondary | ICD-10-CM

## 2022-07-29 DIAGNOSIS — E1169 Type 2 diabetes mellitus with other specified complication: Secondary | ICD-10-CM | POA: Diagnosis not present

## 2022-07-29 DIAGNOSIS — I10 Essential (primary) hypertension: Secondary | ICD-10-CM

## 2022-07-29 DIAGNOSIS — Z23 Encounter for immunization: Secondary | ICD-10-CM | POA: Diagnosis not present

## 2022-07-29 DIAGNOSIS — Z0000 Encounter for general adult medical examination without abnormal findings: Secondary | ICD-10-CM

## 2022-07-29 DIAGNOSIS — Z125 Encounter for screening for malignant neoplasm of prostate: Secondary | ICD-10-CM

## 2022-07-29 LAB — COMPREHENSIVE METABOLIC PANEL
ALT: 28 U/L (ref 0–53)
AST: 18 U/L (ref 0–37)
Albumin: 4.5 g/dL (ref 3.5–5.2)
Alkaline Phosphatase: 81 U/L (ref 39–117)
BUN: 24 mg/dL — ABNORMAL HIGH (ref 6–23)
CO2: 27 mEq/L (ref 19–32)
Calcium: 10.4 mg/dL (ref 8.4–10.5)
Chloride: 104 mEq/L (ref 96–112)
Creatinine, Ser: 1.35 mg/dL (ref 0.40–1.50)
GFR: 54.04 mL/min — ABNORMAL LOW (ref >60.00)
Glucose, Bld: 116 mg/dL — ABNORMAL HIGH (ref 70–99)
Potassium: 5.1 mEq/L (ref 3.5–5.1)
Sodium: 140 mEq/L (ref 135–145)
Total Bilirubin: 0.4 mg/dL (ref 0.2–1.2)
Total Protein: 7.9 g/dL (ref 6.0–8.3)

## 2022-07-29 LAB — CBC WITH DIFFERENTIAL/PLATELET
Basophils Absolute: 0 10*3/uL (ref 0.0–0.1)
Basophils Relative: 0.6 % (ref 0.0–3.0)
Eosinophils Absolute: 0.2 10*3/uL (ref 0.0–0.7)
Eosinophils Relative: 2.8 % (ref 0.0–5.0)
HCT: 46.2 % (ref 39.0–52.0)
Hemoglobin: 15.4 g/dL (ref 13.0–17.0)
Lymphocytes Relative: 44.1 % (ref 12.0–46.0)
Lymphs Abs: 2.8 10*3/uL (ref 0.7–4.0)
MCHC: 33.4 g/dL (ref 30.0–36.0)
MCV: 98 fl (ref 78.0–100.0)
Monocytes Absolute: 0.5 10*3/uL (ref 0.1–1.0)
Monocytes Relative: 7.6 % (ref 3.0–12.0)
Neutro Abs: 2.9 10*3/uL (ref 1.4–7.7)
Neutrophils Relative %: 44.9 % (ref 43.0–77.0)
Platelets: 169 10*3/uL (ref 150.0–400.0)
RBC: 4.71 Mil/uL (ref 4.22–5.81)
RDW: 14 % (ref 11.5–15.5)
WBC: 6.4 10*3/uL (ref 4.0–10.5)

## 2022-07-29 LAB — LIPID PANEL
Cholesterol: 117 mg/dL (ref 0–200)
HDL: 60.9 mg/dL (ref >39.00)
LDL Cholesterol: 45 mg/dL (ref 0–99)
NonHDL: 55.97
Total CHOL/HDL Ratio: 2
Triglycerides: 57 mg/dL (ref 0.0–149.0)
VLDL: 11.4 mg/dL (ref 0.0–40.0)

## 2022-07-29 LAB — MICROALBUMIN / CREATININE URINE RATIO
Creatinine,U: 166.8 mg/dL
Microalb Creat Ratio: 2.3 mg/g (ref 0.0–30.0)
Microalb, Ur: 3.9 mg/dL — ABNORMAL HIGH (ref 0.0–1.9)

## 2022-07-29 LAB — PSA: PSA: 0.93 ng/mL (ref 0.10–4.00)

## 2022-07-29 LAB — HEMOGLOBIN A1C: Hgb A1c MFr Bld: 8 % — ABNORMAL HIGH (ref 4.6–6.5)

## 2022-07-29 NOTE — Patient Instructions (Signed)
It was great seeing you today   We will follow up with you regarding your lab work   Please let me know if you need anything   I will see you back in three months

## 2022-07-29 NOTE — Progress Notes (Signed)
Subjective:    Patient ID: Jason [name], male    DOB: 1956/07/01, 66 y.o.   MRN: [id number]  HPI Patient presents for yearly preventative medicine examination. He is a pleasant 66 year old male who  has a past medical history of Bell palsy, Diabetes mellitus without complication ([place]), and HTN (hypertension).  DM Type 2 - managed with metformin 1000 mg twice daily, Jardiance 25 mg daily, and Trulicity 1.5 mg weekly.  He does check his blood sugars periodically at home with readings between 100-140.  His appetite has decreased since he started on Trulicity. He has been able to lose 12 pounds since his last visit. He has cut out soda completely and is eating healthier and is exercising on a routine basis.  Lab Results  Component Value Date   HGBA1C 7.8 (A) 04/28/2022   HTN -managed with Cardizem 180 mg daily.  He does not monitor his blood pressure at home.  Denies dizziness, lightheadedness, chest pain, shortness of breath BP Readings from Last 3 Encounters:  07/29/22 (!) 138/98  06/10/22 122/70  04/28/22 130/80   Hyperlipidemia-managed with Crestor 5 mg daily.  He denies myalgia or fatigue  Lab Results  Component Value Date   CHOL 150 07/10/2021   HDL 61 07/10/2021   LDLCALC 79 07/10/2021   TRIG 49 07/10/2021   CHOLHDL 2.5 07/10/2021    All immunizations and health maintenance protocols were reviewed with the patient and needed orders were placed.  Appropriate screening laboratory values were ordered for the patient including screening of hyperlipidemia, renal function and hepatic function. If indicated by BPH, a PSA was ordered.  Medication reconciliation,  past medical history, social history, problem list and allergies were reviewed in detail with the patient  Goals were established with regard to weight loss, exercise, and  diet in compliance with medications  Wt Readings from Last 5 Encounters:  07/29/22 242 lb (109.8 kg)  06/10/22 253 lb (114.8 kg)  05/26/22 257  lb (116.6 kg)  04/28/22 254 lb (115.2 kg)  01/27/22 255 lb (115.7 kg)   He is due for 3 year screening colonoscopy. A recall let was sent but this has not been scheduled yet   Overall he is feeling really good and has no acute complaints.   Review of Systems  Constitutional: Negative.   HENT: Negative.    Eyes: Negative.   Respiratory: Negative.    Cardiovascular: Negative.   Gastrointestinal: Negative.   Endocrine: Negative.   Genitourinary: Negative.   Musculoskeletal: Negative.   Skin: Negative.   Allergic/Immunologic: Negative.   Neurological: Negative.   Hematological: Negative.   Psychiatric/Behavioral: Negative.    All other systems reviewed and are negative.  Past Medical History:  Diagnosis Date   Bell palsy    Diabetes mellitus without complication (HCC)    HTN (hypertension)     Social History   Socioeconomic History   Marital status: Widowed    Spouse name: Not on file   Number of children: 2   Years of education: 12   Highest education level: Not on file  Occupational History   Occupation: transportation    Comment: Publishing rights manager  Tobacco Use   Smoking status: Never   Smokeless tobacco: Never  Vaping Use   Vaping Use: Never used  Substance and Sexual Activity   Alcohol use: Yes    Alcohol/week: 6.0 standard drinks of alcohol    Types: 6 Cans of beer per week    Comment: on weekends  Drug use: No   Sexual activity: Not on file  Other Topics Concern   Not on file  Social History Narrative      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       Fun: fix cars, drag race    Social Determinants of Health   Financial Resource Strain: Low Risk  (09/08/2021)   Overall Financial Resource Strain (CARDIA)    Difficulty of Paying Living Expenses: Not hard at all  Food Insecurity: No Food Insecurity (09/07/2020)   Hunger Vital Sign    Worried About Running Out of Food in the Last Year: Never true    Ran Out of  Food in the Last Year: Never true  Transportation Needs: No Transportation Needs (09/08/2021)   PRAPARE - Hydrologist (Medical): No    Lack of Transportation (Non-Medical): No  Physical Activity: Sufficiently Active (09/08/2021)   Exercise Vital Sign    Days of Exercise per Week: 5 days    Minutes of Exercise per Session: 50 min  Stress: No Stress Concern Present (09/07/2020)   [place]    Feeling of Stress : Not at all  Social Connections: Moderately Isolated (09/08/2021)   Social Connection and Isolation Panel [NHANES]    Frequency of Communication with Friends and Family: Three times a week    Frequency of Social Gatherings with Friends and Family: Three times a week    Attends Religious Services: More than 4 times per year    Active Member of Clubs or Organizations: No    Attends Archivist Meetings: Never    Marital Status: Widowed  Intimate Partner Violence: Not At Risk (09/08/2021)   Humiliation, Afraid, Rape, and Kick questionnaire    Fear of Current or Ex-Partner: No    Emotionally Abused: No    Physically Abused: No    Sexually Abused: No    Past Surgical History:  Procedure Laterality Date   COLONOSCOPY W/ POLYPECTOMY      Family History  Problem Relation Age of Onset   Stroke Mother    Colon cancer Sister    High blood pressure Sister    Colon cancer Brother    High blood pressure Brother    Stomach cancer Brother    Lymphoma Son    Colon cancer Other        1st degree relative <60   Diabetes Other        1st degree relative    Stroke Other    Esophageal cancer Neg Hx    Rectal cancer Neg Hx     Allergies  Allergen Reactions   Lisinopril     hyperkalemia   Reglan [Metoclopramide] Anxiety    Patient had bad reaction requiring ativan in addition to benadryl. He doesn't want to receive it any more    Current Outpatient Medications on File Prior to  Visit  Medication Sig Dispense Refill   aspirin-acetaminophen-caffeine (EXCEDRIN MIGRAINE) 250-250-65 MG tablet Take 1 tablet by mouth every 6 (six) hours as needed for headache.     Blood Glucose Monitoring Suppl (ONE TOUCH ULTRA 2) w/Device KIT Use the device daily to check your blood sugars 1-4 times as instructed. 1 kit 0   diltiazem (CARDIZEM CD) 120 MG 24 hr capsule TAKE 1 CAPSULE BY MOUTH EVERY DAY 90 capsule 3   Dulaglutide (TRULICITY) 1.5 GE/3.6OQ SOPN Inject 1.5 mg into  the skin once a week. 2 mL 2   empagliflozin (JARDIANCE) 25 MG TABS tablet Take 1 tablet (25 mg total) by mouth daily. 90 tablet 1   Lancets (ONETOUCH ULTRASOFT) lancets Use to check blood sugar BID 100 each 12   Multiple Vitamins-Minerals (CENTRUM SILVER PO) Take 1 tablet by mouth daily.     ONETOUCH VERIO test strip USE TO TEST BLOOD SUGAR TWICE DAILY 100 strip 5   rosuvastatin (CRESTOR) 5 MG tablet TAKE 1 TABLET BY MOUTH EVERYDAY AT BEDTIME 90 tablet 3   vitamin B-12 (CYANOCOBALAMIN) 1000 MCG tablet Take 1 tablet (1,000 mcg total) by mouth daily. 30 tablet 0   metFORMIN (GLUCOPHAGE XR) 500 MG 24 hr tablet Take 2 tablets (1,000 mg total) by mouth in the morning and at bedtime. 360 tablet 0   No current facility-administered medications on file prior to visit.    BP (!) 138/98   Pulse 73   Temp 98.2 F (36.8 C) (Oral)   Ht '6\' 2"'  (1.88 m)   Wt 242 lb (109.8 kg)   SpO2 99%   BMI 31.07 kg/m       Objective:   Physical Exam Vitals and nursing note reviewed.  Constitutional:      General: He is not in acute distress.    Appearance: Normal appearance. He is well-developed. He is obese.  HENT:     Head: Normocephalic and atraumatic.     Right Ear: Tympanic membrane, ear canal and external ear normal. There is no impacted cerumen.     Left Ear: Tympanic membrane, ear canal and external ear normal. There is no impacted cerumen.     Nose: Nose normal. No congestion or rhinorrhea.     Mouth/Throat:     Mouth:  Mucous membranes are moist.     Pharynx: Oropharynx is clear. No oropharyngeal exudate or posterior oropharyngeal erythema.  Eyes:     General:        Right eye: No discharge.        Left eye: No discharge.     Extraocular Movements: Extraocular movements intact.     Conjunctiva/sclera: Conjunctivae normal.     Pupils: Pupils are equal, round, and reactive to light.  Neck:     Vascular: No carotid bruit.     Trachea: No tracheal deviation.  Cardiovascular:     Rate and Rhythm: Normal rate and regular rhythm.     Pulses: Normal pulses.     Heart sounds: Normal heart sounds. No murmur heard.    No friction rub. No gallop.  Pulmonary:     Effort: Pulmonary effort is normal. No respiratory distress.     Breath sounds: Normal breath sounds. No stridor. No wheezing, rhonchi or rales.  Chest:     Chest wall: No tenderness.  Abdominal:     General: Bowel sounds are normal. There is no distension.     Palpations: Abdomen is soft. There is no mass.     Tenderness: There is no abdominal tenderness. There is no right CVA tenderness, left CVA tenderness, guarding or rebound.     Hernia: No hernia is present.  Musculoskeletal:        General: No swelling, tenderness, deformity or signs of injury. Normal range of motion.     Right lower leg: No edema.     Left lower leg: No edema.  Lymphadenopathy:     Cervical: No cervical adenopathy.  Skin:    General: Skin is warm and dry.  Capillary Refill: Capillary refill takes less than 2 seconds.     Coloration: Skin is not jaundiced or pale.     Findings: No bruising, erythema, lesion or rash.  Neurological:     General: No focal deficit present.     Mental Status: He is alert and oriented to person, place, and time.     Cranial Nerves: No cranial nerve deficit.     Sensory: No sensory deficit.     Motor: No weakness.     Coordination: Coordination normal.     Gait: Gait normal.     Deep Tendon Reflexes: Reflexes normal.  Psychiatric:         Mood and Affect: Mood normal.        Behavior: Behavior normal.        Thought Content: Thought content normal.        Judgment: Judgment normal.       Assessment & Plan:  1. Routine general medical examination at a health care facility - Continue with lifestyle modifications - CBC with Differential/Platelet; Future - Comprehensive metabolic panel; Future - Hemoglobin A1c; Future - Lipid panel; Future  2. Type 2 diabetes mellitus with other specified complication, without long-term current use of insulin ([place]) - He is doing very well in lifestyle changes. Hopefully we can start dialing back on some of his oral medications soon.  - Follow up in 3 months  - CBC with Differential/Platelet; Future - Comprehensive metabolic panel; Future - Hemoglobin A1c; Future - Lipid panel; Future - Microalbumin/Creatinine Ratio, Urine; Future  3. Essential hypertension - Controlled. No change in medications. He did not take his medication prior to the appointment today  - CBC with Differential/Platelet; Future - Comprehensive metabolic panel; Future - Hemoglobin A1c; Future - Lipid panel; Future - Microalbumin/Creatinine Ratio, Urine; Future  4. Mixed hyperlipidemia - Consider increase in statin  - CBC with Differential/Platelet; Future - Comprehensive metabolic panel; Future - Hemoglobin A1c; Future - Lipid panel; Future  5. Prostate cancer screening  - PSA; Future   6. Need for pneumococcal vaccination  - Pneumococcal conjugate vaccine 20-valent (Prevnar 20)  [name], NP

## 2022-08-18 ENCOUNTER — Other Ambulatory Visit: Payer: Self-pay | Admitting: Adult Health

## 2022-08-18 DIAGNOSIS — E1169 Type 2 diabetes mellitus with other specified complication: Secondary | ICD-10-CM

## 2022-09-01 ENCOUNTER — Encounter: Payer: Medicare Other | Attending: Adult Health | Admitting: Dietician

## 2022-09-01 ENCOUNTER — Encounter: Payer: Self-pay | Admitting: Dietician

## 2022-09-01 DIAGNOSIS — E1169 Type 2 diabetes mellitus with other specified complication: Secondary | ICD-10-CM | POA: Insufficient documentation

## 2022-09-01 NOTE — Progress Notes (Signed)
Diabetes Self-Management Education  Visit Type: Follow-up  Appt. Start Time: 1630 Appt. End Time: 9767  09/01/2022  [name], identified by name and date of birth, is a 66 y.o. male with a diagnosis of Diabetes:  .   ASSESSMENT Patient is here today alone.  He was last seen by this RD 05/26/2022.  He has lost 8 lbs since June by reducing portions and states Trulicity is helping decrease his appetite. Fasting blood glucose 114 this am.  129 was the highest fasting reading in the past month. 170 at HS. 123 blood glucose in office now. He has been cutting out regular soda most often.. Continues to drink other sweet drinks at times. Unable to exercise much due to hip issues.    History includes Type 2 Diabetes, HTN Medications noted and to include Trulicity, Jardiance, and Metformin XR Labs noted to include A1C 8% 07/29/2022 increased from 7.8% 04/28/2022, GFR of 53 01/27/2022   Patient lives with a friend.  They share shopping and cooking. He works as a Dispensing optician. Eats lunch on the road. Pain in hip and unable to walk at this time.  He does yard work and stays active. Has Silver Sneakers and can use the [employer] but has not. There were no vitals taken for this visit. There is no height or weight on file to calculate BMI.   Diabetes Self-Management Education - 09/01/22 1655       Visit Information   Visit Type Follow-up      Psychosocial Assessment   Patient Belief/Attitude about Diabetes Motivated to manage diabetes    What is the hardest part about your diabetes right now, causing you the most concern, or is the most worrisome to you about your diabetes?   Being active    Self-care barriers Other (comment)   hip issues   Self-management support Doctor's office    Other persons present Patient    Special Needs None    Learning Readiness Ready    How often do you need to have someone help you when you read instructions, pamphlets, or other written materials from  your doctor or pharmacy? 1 - Never      Pre-Education Assessment   Patient understands the diabetes disease and treatment process. Needs Review    Patient understands incorporating nutritional management into lifestyle. Comprehends key points    Patient undertands incorporating physical activity into lifestyle. Needs Review    Patient understands using medications safely. Comprehends key points    Patient understands monitoring blood glucose, interpreting and using results Needs Review    Patient understands prevention, detection, and treatment of acute complications. Comprehends key points    Patient understands prevention, detection, and treatment of chronic complications. Compreheands key points    Patient understands how to develop strategies to address psychosocial issues. Comprehends key points    Patient understands how to develop strategies to promote health/change behavior. Needs Review      Complications   Last HgB A1C per patient/outside source 8 %   07/29/2022   How often do you check your blood sugar? 1-2 times/day    Fasting Blood glucose range (mg/dL) 70-129    Postprandial Blood glucose range (mg/dL) 130-179;70-129      Dietary Intake   Breakfast bacon, frozen waffles, regular syrup    Snack (morning) none    Lunch Ground beef mixed with corn and frozen vegetables    Snack (afternoon) none    Dinner BLT on potato bread, chips  Snack (evening) rare    Beverage(s) water, occasional regular soda, half and half tea, coffee with 2 tsp sugar      Activity / Exercise   Activity / Exercise Type Light (walking / raking leaves)      Patient Education   Previous Diabetes Education Yes (please comment)   05/2022   Healthy Eating Meal options for control of blood glucose level and chronic complications.;Other (comment);Information on hints to eating out and maintain blood glucose control.   beverage choices     Individualized Goals (developed by patient)   Nutrition General  guidelines for healthy choices and portions discussed    Physical Activity Exercise 3-5 times per week;30 minutes per day    Medications take my medication as prescribed    Monitoring  Test my blood glucose as discussed    Problem Solving Eating Pattern;Addressing barriers to behavior change      Patient Self-Evaluation of Goals - Patient rates self as meeting previously set goals (% of time)   Nutrition 50 - 75 % (half of the time)    Physical Activity 25 - 50% (sometimes)    Medications >75% (most of the time)    Monitoring >75% (most of the time)    Problem Solving and behavior change strategies  50 - 75 % (half of the time)    Reducing Risk (treating acute and chronic complications) >30% (most of the time)    Health Coping >75% (most of the time)      Post-Education Assessment   Patient understands the diabetes disease and treatment process. Comprehends key points    Patient understands incorporating nutritional management into lifestyle. Comprehends key points    Patient undertands incorporating physical activity into lifestyle. Comprehends key points    Patient understands using medications safely. Comphrehends key points    Patient understands monitoring blood glucose, interpreting and using results Comprehends key points    Patient understands prevention, detection, and treatment of acute complications. Comprehends key points    Patient understands prevention, detection, and treatment of chronic complications. Comprehends key points    Patient understands how to develop strategies to address psychosocial issues. Comprehends key points    Patient understands how to develop strategies to promote health/change behavior. Comprehends key points      Outcomes   Expected Outcomes Demonstrated interest in learning. Expect positive outcomes    Future DMSE 3-4 months    Program Status Not Completed      Subsequent Visit   Since your last visit have you continued or begun to take your  medications as prescribed? Yes    Since your last visit have you experienced any weight changes? Loss    Weight Loss (lbs) 8             Individualized Plan for Diabetes Self-Management Training:   Learning Objective:  Patient will have a greater understanding of diabetes self-management. Patient education plan is to attend individual and/or group sessions per assessed needs and concerns.   Plan:   Patient Instructions  Doristine Devoid job with the changes that you have made! Consider going to the [facility] and using the pool or doing other exercises that are easier on your hip. Continue to work towards choosing beverages with zero sugar/carbs Eat more meals at home  Consistency with meals.  Portion control. Avoid eating snacks when you aren't hungry. Choose beverages with zero sugar/carbohydrates   Mindfulness:             Consistently scheduled meal -  avoid skipping             Choices             Eat slowly             Away from distraction (sitting in kitchen or dining room)             Stop eating when satisfied             Before a snack ask, "Am I hungry or eating for another reason?"                         "What can I do instead if I am not hungry?"  Expected Outcomes:  Demonstrated interest in learning. Expect positive outcomes  Education material provided:   If problems or questions, patient to contact team via:  Phone  Future DSME appointment: 3-4 months

## 2022-09-01 NOTE — Patient Instructions (Signed)
Great job with the changes that you have made! Consider going to the [facility] and using the pool or doing other exercises that are easier on your hip. Continue to work towards choosing beverages with zero sugar/carbs Eat more meals at home  Consistency with meals.  Portion control. Avoid eating snacks when you aren't hungry. Choose beverages with zero sugar/carbohydrates   Mindfulness:             Consistently scheduled meal - avoid skipping             Choices             Eat slowly             Away from distraction (sitting in kitchen or dining room)             Stop eating when satisfied             Before a snack ask, "Am I hungry or eating for another reason?"                         "What can I do instead if I am not hungry?"

## 2022-09-04 ENCOUNTER — Other Ambulatory Visit: Payer: Self-pay | Admitting: Adult Health

## 2022-09-04 DIAGNOSIS — E1169 Type 2 diabetes mellitus with other specified complication: Secondary | ICD-10-CM

## 2022-09-09 ENCOUNTER — Ambulatory Visit (INDEPENDENT_AMBULATORY_CARE_PROVIDER_SITE_OTHER): Payer: Medicare Other

## 2022-09-09 VITALS — Ht 74.0 in | Wt 242.0 lb

## 2022-09-09 DIAGNOSIS — Z1211 Encounter for screening for malignant neoplasm of colon: Secondary | ICD-10-CM | POA: Diagnosis not present

## 2022-09-09 DIAGNOSIS — Z0000 Encounter for general adult medical examination without abnormal findings: Secondary | ICD-10-CM

## 2022-09-09 NOTE — Patient Instructions (Addendum)
Jason [name] , Thank you for taking time to come for your Medicare Wellness Visit. I appreciate your ongoing commitment to your health goals. Please review the following plan we discussed and let me know if I can assist you in the future.   These are the goals we discussed:  Goals       Patient Stated      I will continue to exercise at least 3 days per week      Patient stated (pt-stated)      Get my A1c down to get off diabetic medication.        This is a list of the screening recommended for you and due dates:  Health Maintenance  Topic Date Due   Eye exam for diabetics  10/12/2021   Complete foot exam   06/17/2022   COVID-19 Vaccine (4 - Pfizer series) 09/25/2022*   Zoster (Shingles) Vaccine (2 of 2) 12/09/2022*   Flu Shot  03/12/2023*   Colon Cancer Screening  09/10/2023*   Hemoglobin A1C  01/29/2023   Yearly kidney function blood test for diabetes  07/30/2023   Yearly kidney health urinalysis for diabetes  07/30/2023   Tetanus Vaccine  02/01/2026   Pneumonia Vaccine  Completed   Hepatitis C Screening: USPSTF Recommendation to screen - Ages 48-79 yo.  Completed   HPV Vaccine  Aged Out  *Topic was postponed. The date shown is not the original due date.    Advanced directives: Advance directive discussed with you today. Even though you declined this today, please call our office should you change your mind, and we can give you the proper paperwork for you to fill out.   Conditions/risks identified: None  Next appointment: Follow up in one year for your annual wellness visit.   Preventive Care 34 Years and Older, Male  Preventive care refers to lifestyle choices and visits with your health care provider that can promote health and wellness. What does preventive care include? A yearly physical exam. This is also called an annual well check. Dental exams once or twice a year. Routine eye exams. Ask your health care provider how often you should have your eyes  checked. Personal lifestyle choices, including: Daily care of your teeth and gums. Regular physical activity. Eating a healthy diet. Avoiding tobacco and drug use. Limiting alcohol use. Practicing safe sex. Taking low doses of aspirin every day. Taking vitamin and mineral supplements as recommended by your health care provider. What happens during an annual well check? The services and screenings done by your health care provider during your annual well check will depend on your age, overall health, lifestyle risk factors, and family history of disease. Counseling  Your health care provider may ask you questions about your: Alcohol use. Tobacco use. Drug use. Emotional well-being. Home and relationship well-being. Sexual activity. Eating habits. History of falls. Memory and ability to understand (cognition). Work and work Statistician. Screening  You may have the following tests or measurements: Height, weight, and BMI. Blood pressure. Lipid and cholesterol levels. These may be checked every 5 years, or more frequently if you are over 66 years old. Skin check. Lung cancer screening. You may have this screening every year starting at age 40 if you have a 30-pack-year history of smoking and currently smoke or have quit within the past 15 years. Fecal occult blood test (FOBT) of the stool. You may have this test every year starting at age 66. Flexible sigmoidoscopy or colonoscopy. You may have a sigmoidoscopy  every 5 years or a colonoscopy every 10 years starting at age 63. Prostate cancer screening. Recommendations will vary depending on your family history and other risks. Hepatitis C blood test. Hepatitis B blood test. Sexually transmitted disease (STD) testing. Diabetes screening. This is done by checking your blood sugar (glucose) after you have not eaten for a while (fasting). You may have this done every 1-3 years. Abdominal aortic aneurysm (AAA) screening. You may need this  if you are a current or former smoker. Osteoporosis. You may be screened starting at age 68 if you are at high risk. Talk with your health care provider about your test results, treatment options, and if necessary, the need for more tests. Vaccines  Your health care provider may recommend certain vaccines, such as: Influenza vaccine. This is recommended every year. Tetanus, diphtheria, and acellular pertussis (Tdap, Td) vaccine. You may need a Td booster every 10 years. Zoster vaccine. You may need this after age 57. Pneumococcal 13-valent conjugate (PCV13) vaccine. One dose is recommended after age 77. Pneumococcal polysaccharide (PPSV23) vaccine. One dose is recommended after age 74. Talk to your health care provider about which screenings and vaccines you need and how often you need them. This information is not intended to replace advice given to you by your health care provider. Make sure you discuss any questions you have with your health care provider. Document Released: 12/25/2015 Document Revised: 08/17/2016 Document Reviewed: 09/29/2015 Elsevier Interactive Patient Education  2017 [place] Prevention in the Home Falls can cause injuries. They can happen to people of all ages. There are many things you can do to make your home safe and to help prevent falls. What can I do on the outside of my home? Regularly fix the edges of walkways and driveways and fix any cracks. Remove anything that might make you trip as you walk through a door, such as a raised step or threshold. Trim any bushes or trees on the path to your home. Use bright outdoor lighting. Clear any walking paths of anything that might make someone trip, such as rocks or tools. Regularly check to see if handrails are loose or broken. Make sure that both sides of any steps have handrails. Any raised decks and porches should have guardrails on the edges. Have any leaves, snow, or ice cleared regularly. Use sand  or salt on walking paths during winter. Clean up any spills in your garage right away. This includes oil or grease spills. What can I do in the bathroom? Use night lights. Install grab bars by the toilet and in the tub and shower. Do not use towel bars as grab bars. Use non-skid mats or decals in the tub or shower. If you need to sit down in the shower, use a plastic, non-slip stool. Keep the floor dry. Clean up any water that spills on the floor as soon as it happens. Remove soap buildup in the tub or shower regularly. Attach bath mats securely with double-sided non-slip rug tape. Do not have throw rugs and other things on the floor that can make you trip. What can I do in the bedroom? Use night lights. Make sure that you have a light by your bed that is easy to reach. Do not use any sheets or blankets that are too big for your bed. They should not hang down onto the floor. Have a firm chair that has side arms. You can use this for support while you get dressed. Do not have  throw rugs and other things on the floor that can make you trip. What can I do in the kitchen? Clean up any spills right away. Avoid walking on wet floors. Keep items that you use a lot in easy-to-reach places. If you need to reach something above you, use a strong step stool that has a grab bar. Keep electrical cords out of the way. Do not use floor polish or wax that makes floors slippery. If you must use wax, use non-skid floor wax. Do not have throw rugs and other things on the floor that can make you trip. What can I do with my stairs? Do not leave any items on the stairs. Make sure that there are handrails on both sides of the stairs and use them. Fix handrails that are broken or loose. Make sure that handrails are as long as the stairways. Check any carpeting to make sure that it is firmly attached to the stairs. Fix any carpet that is loose or worn. Avoid having throw rugs at the top or bottom of the stairs.  If you do have throw rugs, attach them to the floor with carpet tape. Make sure that you have a light switch at the top of the stairs and the bottom of the stairs. If you do not have them, ask someone to add them for you. What else can I do to help prevent falls? Wear shoes that: Do not have high heels. Have rubber bottoms. Are comfortable and fit you well. Are closed at the toe. Do not wear sandals. If you use a stepladder: Make sure that it is fully opened. Do not climb a closed stepladder. Make sure that both sides of the stepladder are locked into place. Ask someone to hold it for you, if possible. Clearly mark and make sure that you can see: Any grab bars or handrails. First and last steps. Where the edge of each step is. Use tools that help you move around (mobility aids) if they are needed. These include: Canes. Walkers. Scooters. Crutches. Turn on the lights when you go into a dark area. Replace any light bulbs as soon as they burn out. Set up your furniture so you have a clear path. Avoid moving your furniture around. If any of your floors are uneven, fix them. If there are any pets around you, be aware of where they are. Review your medicines with your doctor. Some medicines can make you feel dizzy. This can increase your chance of falling. Ask your doctor what other things that you can do to help prevent falls. This information is not intended to replace advice given to you by your health care provider. Make sure you discuss any questions you have with your health care provider. Document Released: 09/24/2009 Document Revised: 05/05/2016 Document Reviewed: 01/02/2015 Elsevier Interactive Patient Education  2017 [employer].

## 2022-09-09 NOTE — Progress Notes (Signed)
Subjective:   Jason [name] is a 66 y.o. male who presents for Medicare Annual/Subsequent preventive examination.  Review of Systems    Virtual Visit via Telephone Note  I connected with  Jason [name] on 09/09/22 at  2:00 PM EDT by telephone and verified that I am speaking with the correct person using two identifiers.  Location: Patient: Home Provider: Office Persons participating in the virtual visit: patient/Nurse Health Advisor   I discussed the limitations, risks, security and privacy concerns of performing an evaluation and management service by telephone and the availability of in person appointments. The patient expressed understanding and agreed to proceed.  Interactive audio and video telecommunications were attempted between this nurse and patient, however failed, due to patient having technical difficulties OR patient did not have access to video capability.  We continued and completed visit with audio only.  Some vital signs may be absent or patient reported.   Criselda Peaches, LPN  Cardiac Risk Factors include: advanced age (>36mn, >>17women);diabetes mellitus;male gender;hypertension;obesity (BMI >30kg/m2)     Objective:    Today's Vitals   09/09/22 1409  Weight: 242 lb (109.8 kg)  Height: '6\' 2"'  (1.88 m)   Body mass index is 31.07 kg/m.     09/09/2022    2:18 PM 11/25/2021    2:09 PM 10/15/2021    8:15 PM 09/08/2021    3:22 PM 07/26/2021    4:00 AM 07/21/2021    2:00 PM 07/10/2021    1:34 AM  Advanced Directives  Does Patient Have a Medical Advance Directive? No No No No No No No  Would patient like information on creating a medical advance directive? No - Patient declined No - Patient declined No - Patient declined No - Patient declined No - Guardian declined No - Patient declined No - Patient declined    Current Medications (verified) Outpatient Encounter Medications as of 09/09/2022  Medication Sig   aspirin-acetaminophen-caffeine  (EXCEDRIN MIGRAINE) 250-250-65 MG tablet Take 1 tablet by mouth every 6 (six) hours as needed for headache.   Blood Glucose Monitoring Suppl (ONE TOUCH ULTRA 2) w/Device KIT Use the device daily to check your blood sugars 1-4 times as instructed.   diltiazem (CARDIZEM CD) 120 MG 24 hr capsule TAKE 1 CAPSULE BY MOUTH EVERY DAY   empagliflozin (JARDIANCE) 25 MG TABS tablet Take 1 tablet (25 mg total) by mouth daily.   Lancets (ONETOUCH ULTRASOFT) lancets Use to check blood sugar BID   metFORMIN (GLUCOPHAGE-XR) 500 MG 24 hr tablet TAKE 2 TABLETS (1,000 MG TOTAL) BY MOUTH IN THE MORNING AND AT BEDTIME.   Multiple Vitamins-Minerals (CENTRUM SILVER PO) Take 1 tablet by mouth daily.   ONETOUCH VERIO test strip USE TO TEST BLOOD SUGAR TWICE DAILY   rosuvastatin (CRESTOR) 5 MG tablet TAKE 1 TABLET BY MOUTH EVERYDAY AT BEDTIME   vitamin B-12 (CYANOCOBALAMIN) 1000 MCG tablet Take 1 tablet (1,000 mcg total) by mouth daily.   No facility-administered encounter medications on file as of 09/09/2022.    Allergies (verified) Lisinopril and Reglan [metoclopramide]   History: Past Medical History:  Diagnosis Date   Bell palsy    Diabetes mellitus without complication (HCC)    HTN (hypertension)    Past Surgical History:  Procedure Laterality Date   COLONOSCOPY W/ POLYPECTOMY     Family History  Problem Relation Age of Onset   Stroke Mother    Colon cancer Sister    High blood pressure Sister    Colon  cancer Brother    High blood pressure Brother    Stomach cancer Brother    Lymphoma Son    Colon cancer Other        1st degree relative <60   Diabetes Other        1st degree relative    Stroke Other    Esophageal cancer Neg Hx    Rectal cancer Neg Hx    Social History   Socioeconomic History   Marital status: Widowed    Spouse name: Not on file   Number of children: 2   Years of education: 12   Highest education level: Not on file  Occupational History   Occupation: transportation     Comment: Publishing rights manager  Tobacco Use   Smoking status: Never   Smokeless tobacco: Never  Vaping Use   Vaping Use: Never used  Substance and Sexual Activity   Alcohol use: Yes    Alcohol/week: 6.0 standard drinks of alcohol    Types: 6 Cans of beer per week    Comment: on weekends   Drug use: No   Sexual activity: Not on file  Other Topics Concern   Not on file  Social History Narrative      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       Fun: fix cars, drag race    Social Determinants of Health   Financial Resource Strain: Low Risk  (09/09/2022)   Overall Financial Resource Strain (CARDIA)    Difficulty of Paying Living Expenses: Not hard at all  Food Insecurity: No Food Insecurity (09/09/2022)   Hunger Vital Sign    Worried About Running Out of Food in the Last Year: Never true    Ran Out of Food in the Last Year: Never true  Transportation Needs: No Transportation Needs (09/09/2022)   PRAPARE - Hydrologist (Medical): No    Lack of Transportation (Non-Medical): No  Physical Activity: Insufficiently Active (09/09/2022)   Exercise Vital Sign    Days of Exercise per Week: 3 days    Minutes of Exercise per Session: 30 min  Stress: No Stress Concern Present (09/09/2022)   [place]    Feeling of Stress : Not at all  Social Connections: Moderately Integrated (09/09/2022)   Social Connection and Isolation Panel [NHANES]    Frequency of Communication with Friends and Family: More than three times a week    Frequency of Social Gatherings with Friends and Family: More than three times a week    Attends Religious Services: More than 4 times per year    Active Member of [employer] or Organizations: Yes    Attends Archivist Meetings: More than 4 times per year    Marital Status: Widowed    Tobacco Counseling Counseling given: Not  Answered   Clinical Intake: Nutrition Risk Assessment:  Has the patient had any N/V/D within the last 2 months?  No  Does the patient have any non-healing wounds?  No  Has the patient had any unintentional weight loss or weight gain?  No   Diabetes:  Is the patient diabetic?  Yes  If diabetic, was a CBG obtained today?  Yes  CBG 113 Taken by patient Did the patient bring in their glucometer from home?  No  How often do you monitor your CBG's? Daily.   Financial Strains and Diabetes Management:  Are you having any financial strains with the device, your supplies or your medication? No .  Does the patient want to be seen by Chronic Care Management for management of their diabetes?  No  Would the patient like to be referred to a Nutritionist or for Diabetic Management?  No   Diabetic Exams:  Diabetic Eye Exam: Completed No. Overdue for diabetic eye exam. Pt has been advised about the importance in completing this exam. A referral has been placed today. Message sent to referral coordinator for scheduling purposes. Advised pt to expect a call from office referred to regarding appt.  Diabetic Foot Exam: Completed No. Pt has been advised about the importance in completing this exam. Pt is scheduled for diabetic foot exam on Followed by PCP.      Pain : No/denies pain     BMI - recorded: 31.07 Nutritional Status: BMI > 30  Obese Nutritional Risks: None Diabetes: Yes CBG done?: Yes (CBG 113 Taken by patient) CBG resulted in Enter/ Edit results?: Yes Did pt. bring in CBG monitor from home?: No  How often do you need to have someone help you when you read instructions, pamphlets, or other written materials from your doctor or pharmacy?: 1 - Never  Diabetic?  No  Interpreter Needed?: No  Information entered by :: Rolene Arbour LPN   Activities of Daily Living    09/09/2022    2:15 PM 07/29/2022    8:05 AM  In your present state of health, do you have any difficulty  performing the following activities:  Hearing? 0 1  Vision? 0 0  Difficulty concentrating or making decisions? 0 0  Walking or climbing stairs? 0 1  Comment  hip issue  Dressing or bathing? 0 0  Doing errands, shopping? 0 0  Preparing Food and eating ? N   Using the Toilet? N   In the past six months, have you accidently leaked urine? N   Do you have problems with loss of bowel control? N   Managing your Medications? N   Managing your Finances? N   Housekeeping or managing your Housekeeping? N     Patient Care Team: [name], NP as PCP - General (Family Medicine) [name], [facility] as Pharmacist (Pharmacist)  Indicate any recent Medical Services you may have received from other than [facility] providers in the past year (date may be approximate).     Assessment:   This is a routine wellness examination for [facility].  Hearing/Vision screen Hearing Screening - Comments:: Wears rx glasses - up to date with routine eye exams with   Vision Screening - Comments:: Wears rx glasses - up to date with routine eye exams with  Dr Bing Plume  Dietary issues and exercise activities discussed: Current Exercise Habits: Home exercise routine, Type of exercise: walking, Time (Minutes): 30, Frequency (Times/Week): 3, Weekly Exercise (Minutes/Week): 90, Intensity: Moderate, Exercise limited by: None identified   Goals Addressed               This Visit's Progress     Patient stated (pt-stated)        Get my A1c down to get off diabetic medication.       Depression Screen    09/09/2022    2:14 PM 07/29/2022    8:06 AM 11/25/2021    2:09 PM 09/08/2021    3:25 PM 09/08/2021    3:23 PM 09/07/2020    3:38 PM 09/05/2019    2:38 PM  PHQ 2/9  Scores  PHQ - 2 Score 0 0 0 0 0 0 0  PHQ- 9 Score      0     Fall Risk    09/09/2022    2:16 PM 07/29/2022    8:06 AM 11/25/2021    2:09 PM 09/08/2021    3:25 PM 09/07/2020    3:37 PM  Fall Risk   Falls in the past year? 0 0 0 0 0  Number falls in  past yr: 0 0  0 0  Injury with Fall? 0 0  0 0  Risk for fall due to : No Fall Risks No Fall Risks   Medication side effect  Follow up Falls prevention discussed Falls evaluation completed  Falls evaluation completed Falls evaluation completed;Falls prevention discussed    FALL RISK PREVENTION PERTAINING TO THE HOME:  Any stairs in or around the home? Yes  If so, are there any without handrails? Yes Home free of loose throw rugs in walkways, pet beds, electrical cords, etc? Yes  Adequate lighting in your home to reduce risk of falls? Yes   ASSISTIVE DEVICES UTILIZED TO PREVENT FALLS:  Life alert? No  Use of a cane, walker or w/c? No  Grab bars in the bathroom? No  Shower chair or bench in shower? No  Elevated toilet seat or a handicapped toilet? Yes   TIMED UP AND GO:  Was the test performed? No . Audio Visit  Cognitive Function:        09/09/2022    2:18 PM  6CIT Screen  What Year? 0 points  What month? 0 points  What time? 0 points  Count back from 20 0 points  Months in reverse 0 points  Repeat phrase 0 points  Total Score 0 points    Immunizations Immunization History  Administered Date(s) Administered   Fluad Quad(high Dose 65+) 09/11/2019, 09/03/2020   Influenza Split 09/12/2012   Influenza Whole 12/24/2007   Influenza,inj,Quad PF,6+ Mos 02/02/2016, 10/10/2018   PFIZER(Purple Top)SARS-COV-2 Vaccination 02/24/2020, 03/16/2020, 02/25/2021   PNEUMOCOCCAL CONJUGATE-20 07/29/2022   Pneumococcal Conjugate-13 09/11/2019   Pneumococcal Polysaccharide-23 02/25/2016   Td 01/31/2001   Tdap 02/02/2016   Zoster Recombinat (Shingrix) 02/25/2021    TDAP status: Up to date  Flu Vaccine status: Due, Education has been provided regarding the importance of this vaccine. Advised may receive this vaccine at local pharmacy or Health Dept. Aware to provide a copy of the vaccination record if obtained from local pharmacy or Health Dept. Verbalized acceptance and  understanding.  Pneumococcal vaccine status: Up to date  Covid-19 vaccine status: Completed vaccines  Qualifies for Shingles Vaccine? Yes   Zostavax completed No   Shingrix Completed?: No.    Education has been provided regarding the importance of this vaccine. Patient has been advised to call insurance company to determine out of pocket expense if they have not yet received this vaccine. Advised may also receive vaccine at local pharmacy or Health Dept. Verbalized acceptance and understanding.  Screening Tests Health Maintenance  Topic Date Due   OPHTHALMOLOGY EXAM  10/12/2021   FOOT EXAM  06/17/2022   COVID-19 Vaccine (4 - Pfizer series) 09/25/2022 (Originally 04/22/2021)   Zoster Vaccines- Shingrix (2 of 2) 12/09/2022 (Originally 04/22/2021)   INFLUENZA VACCINE  03/12/2023 (Originally 07/12/2022)   COLONOSCOPY (Pts 45-60yr Insurance coverage will need to be confirmed)  09/10/2023 (Originally 05/29/2022)   HEMOGLOBIN A1C  01/29/2023   Diabetic kidney evaluation - GFR measurement  07/30/2023   Diabetic kidney evaluation -  Urine ACR  07/30/2023   TETANUS/TDAP  02/01/2026   Pneumonia Vaccine 72+ Years old  Completed   Hepatitis C Screening  Completed   HPV VACCINES  Aged Out    Health Maintenance  Health Maintenance Due  Topic Date Due   OPHTHALMOLOGY EXAM  10/12/2021   FOOT EXAM  06/17/2022    Colorectal cancer screening: Referral to GI placed 09/09/22. Pt aware the office will call re: appt.  Lung Cancer Screening: (Low Dose CT Chest recommended if Age 39-80 years, 30 pack-year currently smoking OR have quit w/in 15years.) does not qualify.     Additional Screening:  Hepatitis C Screening: does qualify; Completed 02/02/16  Vision Screening: Recommended annual ophthalmology exams for early detection of glaucoma and other disorders of the eye. Is the patient up to date with their annual eye exam?  Yes  Who is the provider or what is the name of the office in which the patient  attends annual eye exams? Dr Bing Plume If pt is not established with a provider, would they like to be referred to a provider to establish care? No .   Dental Screening: Recommended annual dental exams for proper oral hygiene  Community Resource Referral / Chronic Care Management:  CRR required this visit?  No   CCM required this visit?  No      Plan:     I have personally reviewed and noted the following in the patient's chart:   Medical and social history Use of alcohol, tobacco or illicit drugs  Current medications and supplements including opioid prescriptions. Patient is not currently taking opioid prescriptions. Functional ability and status Nutritional status Physical activity Advanced directives List of other physicians Hospitalizations, surgeries, and ER visits in previous 12 months Vitals Screenings to include cognitive, depression, and falls Referrals and appointments  In addition, I have reviewed and discussed with patient certain preventive protocols, quality metrics, and best practice recommendations. A written personalized care plan for preventive services as well as general preventive health recommendations were provided to patient.     Criselda Peaches, LPN   [id number]   Nurse Notes: None

## 2022-09-15 ENCOUNTER — Ambulatory Visit: Payer: Medicare Other | Admitting: Surgery

## 2022-09-16 ENCOUNTER — Other Ambulatory Visit: Payer: Self-pay | Admitting: Adult Health

## 2022-09-16 DIAGNOSIS — E1169 Type 2 diabetes mellitus with other specified complication: Secondary | ICD-10-CM

## 2022-09-19 ENCOUNTER — Encounter: Payer: Self-pay | Admitting: Surgery

## 2022-09-19 ENCOUNTER — Ambulatory Visit: Payer: Medicare Other | Admitting: Surgery

## 2022-09-19 VITALS — BP 166/91 | HR 96 | Ht 74.0 in | Wt 240.0 lb

## 2022-09-19 DIAGNOSIS — M1611 Unilateral primary osteoarthritis, right hip: Secondary | ICD-10-CM | POA: Diagnosis not present

## 2022-09-19 NOTE — Progress Notes (Signed)
Office Visit Note   Patient: Jason [name]           Date of Birth: 17-Jan-1956           MRN: [id number] Visit Date: 09/19/2022              Requested by: [name], NP [address] [place],  [place] [address] PCP: [name], NP   Assessment & Plan: Visit Diagnoses:  1. Unilateral primary osteoarthritis, right hip     Plan: We will schedule appointment with Dr. Ninfa Linden discuss possible scheduling of right total hip replacement.  All questions answered.  Follow-Up Instructions: Return in about 2 weeks (around 10/03/2022) for WITH DR Rosholt RIGHT HIP.   Orders:  No orders of the defined types were placed in this encounter.  No orders of the defined types were placed in this encounter.     Procedures: No procedures performed   Clinical Data: No additional findings.   Subjective: Chief Complaint  Patient presents with   Right Hip - Pain    HPI 66 year old male with known history of end-stage DJD right hip returns.  He had previous diagnostic/therapeutic right hip intra-articular Marcaine/Depo-Medrol injection.  He did have good response with previous injection.  States over last couple weeks his right hip/groin pain has returned.  States that it comes and goes. Review of Systems No complaints of cardiopulmonary GI/GU issues  Objective: Vital Signs: BP (!) 166/91   Pulse 96   Ht '6\' 2"'$  (1.88 m)   Wt 240 lb (108.9 kg)   BMI 30.81 kg/m   Physical Exam HENT:     Head: Normocephalic and atraumatic.     Nose: Nose normal.  Eyes:     Extraocular Movements: Extraocular movements intact.  Musculoskeletal:     Comments: Gait is somewhat antalgic.  Right hip pain with internal/external rotation.  Neurologically intact.  Neurological:     Mental Status: He is alert and oriented to person, place, and time.  Psychiatric:        Mood and Affect: Mood normal.     Ortho Exam  Specialty Comments:  No specialty  comments available.  Imaging: No results found.   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   Tick bite 07/20/2021   AMS (altered mental status) 07/10/2021   Marijuana abuse 07/10/2021   Class 1 obesity due to excess calories with body mass index (BMI) of 30.0 to 30.9 in adult 07/10/2021   Routine general medical examination at a health care facility 02/02/2016   Type 2 diabetes mellitus ([facility]) 06/12/2012   Syncope - neurally mediated 07/04/2011   Essential hypertension 12/24/2007   HYPERGLYCEMIA 12/24/2007   Past Medical History:  Diagnosis Date   Bell palsy    Diabetes mellitus without complication (HCC)    HTN (hypertension)     Family History  Problem Relation Age of Onset   Stroke Mother    Colon cancer Sister    High blood pressure Sister    Colon cancer Brother    High blood pressure Brother    Stomach cancer Brother    Lymphoma Son    Colon cancer Other        1st degree relative <60   Diabetes Other        1st degree  relative    Stroke Other    Esophageal cancer Neg Hx    Rectal cancer Neg Hx     Past Surgical History:  Procedure Laterality Date   COLONOSCOPY W/ POLYPECTOMY     Social History   Occupational History   Occupation: transportation    Comment: Publishing rights manager  Tobacco Use   Smoking status: Never   Smokeless tobacco: Never  Vaping Use   Vaping Use: Never used  Substance and Sexual Activity   Alcohol use: Yes    Alcohol/week: 6.0 standard drinks of alcohol    Types: 6 Cans of beer per week    Comment: on weekends   Drug use: No   Sexual activity: Not on file

## 2022-09-20 ENCOUNTER — Other Ambulatory Visit: Payer: Self-pay | Admitting: Adult Health

## 2022-09-20 DIAGNOSIS — E1169 Type 2 diabetes mellitus with other specified complication: Secondary | ICD-10-CM

## 2022-10-03 ENCOUNTER — Ambulatory Visit: Payer: Medicare Other | Admitting: Orthopaedic Surgery

## 2022-10-03 VITALS — Ht 74.0 in | Wt 240.0 lb

## 2022-10-03 DIAGNOSIS — M25551 Pain in right hip: Secondary | ICD-10-CM

## 2022-10-03 NOTE — Progress Notes (Signed)
The patient is a 66 year old gentleman sent from [facility] to discuss hip replacement surgery for his right hip.  He has well-documented arthritis in his right hip and actually had a steroid injection in that hip by Dr. Ernestina Patches under fluoroscopy in July of this year.  2 weeks ago when he saw Jeneen Rinks his hip was hurting quite a bit but he says that he is off now.  He does not walk with a significant limp.  He does have right hip pain in the groin.  He denies any left hip pain.  I did review his chart and he is a diabetic.  His hemoglobin A1c in August was 8.  Examination of his right hip shows there is pain with internal/external rotation but no blocks to rotation.  His left hip has normal exam.  Previous x-rays of the pelvis and right hip show superior lateral joint space narrowing but he does not complete bone-on-bone wear yet.  It is close though.  We talked about hip replacement surgery and I gave him a hand about hip replacement surgery.  He is not hurting enough right now to have that surgery but regardless, he understands that his blood glucose needs to be under better control because we cannot pursue a surgical intervention until his hemoglobin A1c is below 7.7.  We can see him back in 2 months to discuss things again and see where his blood glucose is running.  He agrees with this treatment plan.  All questions and concerns were answered and addressed.

## 2022-10-06 ENCOUNTER — Encounter: Payer: Self-pay | Admitting: Gastroenterology

## 2022-10-09 ENCOUNTER — Other Ambulatory Visit: Payer: Self-pay | Admitting: Adult Health

## 2022-10-19 ENCOUNTER — Encounter: Payer: Self-pay | Admitting: *Deleted

## 2022-10-19 ENCOUNTER — Telehealth: Payer: Self-pay | Admitting: *Deleted

## 2022-10-19 NOTE — Patient Outreach (Addendum)
  Care Coordination   Initial Visit Note   10/19/2022 Name: Jason [name] MRN: [id number] DOB: Nov 11, 1956  Jason [name] is a 66 y.o. year old male who sees Nafziger, Tommi Rumps, NP for primary care. I spoke with  Archie Endo by phone today.  What matters to the patients health and wellness today?  No needs    Goals Addressed               This Visit's Progress     COMPLETED: Refill needed on Diltiazem (pt-stated)        Care Coordination Interventions: Advised patient to follow up with local [facility] later today to inquired a refill. Epic indicates pt has 3 refills left for 90 days supply that was issued in August. Reviewed medications with patient and discussed adherence with all prescribed medications Reviewed scheduled/upcoming provider appointments including pending appointments with sufficient transportation. Screening for signs and symptoms of depression related to chronic disease state  Assessed social determinant of health barriers RN case manager attempted to [facility] concerning the needed refill on pt's Diltiazem however pharmacy current closed for lunch. Pt aware to follow up with this pharmacy later today and if unsuccessful to contact the provider's office for further interventions or this RN case manager. RN offered the contact number however pt declined indicating he already has the contact number.           SDOH assessments and interventions completed:  Yes  SDOH Interventions Today    Flowsheet Row Most Recent Value  SDOH Interventions   Food Insecurity Interventions Intervention Not Indicated  Transportation Interventions Intervention Not Indicated  Utilities Interventions Intervention Not Indicated        Care Coordination Interventions Activated:  Yes  Care Coordination Interventions:  Yes, provided   Follow up plan: No further intervention required.   Encounter Outcome:  Pt. Visit Completed   Raina Mina,  RN Care Management Coordinator [place] Office [phone]

## 2022-10-19 NOTE — Patient Instructions (Signed)
Visit Information  Thank you for taking time to visit with me today. Please don't hesitate to contact me if I can be of assistance to you.   Following are the goals we discussed today:   Goals Addressed               This Visit's Progress     COMPLETED: Refill needed on Diltiazem (pt-stated)        Care Coordination Interventions: Advised patient to follow up with [facility] later today to inquired a refill. Epic indicates pt has 3 refills left for 90 days supply that was issued in August. Reviewed medications with patient and discussed adherence with all prescribed medications Reviewed scheduled/upcoming provider appointments including pending appointments with sufficient transportation. Screening for signs and symptoms of depression related to chronic disease state  Assessed social determinant of health barriers RN case manager attempted to [facility] concerning the needed refill on pt's Diltiazem however pharmacy current closed for lunch. Pt aware to follow up with this pharmacy later today and if unsuccessful to contact the provider's office for further interventions or this RN case manager. RN offered the contact number however pt declined indicating he already has the contact number.         Please call the care guide team at [phone] if you need to cancel or reschedule your appointment.   If you are experiencing a Mental Health or [place] or need someone to talk to, please call the Suicide and Crisis Lifeline: 988  Patient verbalizes understanding of instructions and care plan provided today and agrees to view in [facility]. Active MyChart status and patient understanding of how to access instructions and care plan via MyChart confirmed with patient.     No further follow up required: No further needs  Raina Mina, RN Care Management Coordinator [place] Office [phone]

## 2022-10-20 ENCOUNTER — Ambulatory Visit (AMBULATORY_SURGERY_CENTER): Payer: Self-pay

## 2022-10-20 VITALS — Ht 74.0 in | Wt 250.0 lb

## 2022-10-20 DIAGNOSIS — Z800 Family history of malignant neoplasm of digestive organs: Secondary | ICD-10-CM

## 2022-10-20 DIAGNOSIS — Z86010 Personal history of colonic polyps: Secondary | ICD-10-CM

## 2022-10-20 MED ORDER — NA SULFATE-K SULFATE-MG SULF 17.5-3.13-1.6 GM/177ML PO SOLN: 1.0000 | Freq: Once | ORAL | 0 refills | Status: AC

## 2022-10-20 NOTE — Progress Notes (Signed)
No egg or soy allergy known to patient   No issues known to pt with past sedation with any surgeries or procedures  Patient denies ever being told they had issues or difficulty with intubation (none per pt)  No FH of Malignant Hyperthermia Pt is not on diet pills Pt is not on  home 02  Pt is not on blood thinners   Pt denies issues with constipation  No A fib or A flutter Have any cardiac testing pending--no  Pt instructed to use Singlecare.com or GoodRx for a price reduction on prep    Patient's chart reviewed by Osvaldo Angst CNRA prior to previsit and patient appropriate for the [facility].  Previsit completed and red dot placed by patient's name on their procedure day (on provider's schedule).

## 2022-10-21 ENCOUNTER — Telehealth: Payer: Self-pay | Admitting: Pharmacist

## 2022-10-21 NOTE — Chronic Care Management (AMB) (Signed)
    Chronic Care Management Pharmacy Assistant   Name: [name]  MRN: [id number] DOB: 1956/10/06  10/24/2022 APPOINTMENT REMINDER  Jason [name] was reminded to have all medications, supplements and any blood glucose and blood pressure readings available for review with [name], Pharm. D, at his telephone visit on 10/24/2022 at 11:45.  Care Gaps: AWV - scheduled 09/13/2023 Last BP - 166/91 on 09/19/2022 Last A1C - 8.0 on 07/29/2022 Covid - overdue Eye exam - overdue Foot exam - overdue Shingrix - postponed Flu - postponed Colonoscopy - postponed  Star Rating Drug: Jardiance 25 mg - last filled 09/16/2022 [facility] Metformin ER 500 mg - last filled 09/07/2022 [facility] Rosuvastatin 5 mg - last filled 10/12/2022 [facility] Trulicity 1.5 mg - last filled 10/11/2022 [facility]  Any gaps in medications fill history? No  Gennie Alma [facility]  Catering manager [phone]

## 2022-10-24 ENCOUNTER — Ambulatory Visit (INDEPENDENT_AMBULATORY_CARE_PROVIDER_SITE_OTHER): Payer: Medicare Other | Admitting: Pharmacist

## 2022-10-24 DIAGNOSIS — I10 Essential (primary) hypertension: Secondary | ICD-10-CM

## 2022-10-24 DIAGNOSIS — E1169 Type 2 diabetes mellitus with other specified complication: Secondary | ICD-10-CM

## 2022-10-24 NOTE — Progress Notes (Signed)
Chronic Care Management Pharmacy Note  10/24/2022 Name:  Jason [name] MRN:  [id number] DOB:  [birth year]  Summary: A1c not at goal < 7% BP not ideally at goal < 130/80 per home and office readings   Recommendations/Changes made from today's visit: -Recommend trial of CGM depending on A1c results -Consider buying a new arm BP cuff as his was not accurate compared to in office cuff  Plan: Follow up after PCP visit on Friday    Subjective: Jason [name] is an 66 y.o. year old male who is a primary patient of [name], NP.  The CCM team was consulted for assistance with disease management and care coordination needs.    Engaged with patient by telephone for follow up visit in response to provider referral for pharmacy case management and/or care coordination services.   Consent to Services:  The patient was given information about Chronic Care Management services, agreed to services, and gave verbal consent prior to initiation of services.  Please see initial visit note for detailed documentation.   Patient Care Team: [name], NP as PCP - General (Family Medicine) [name], [facility] as Pharmacist (Pharmacist)  Recent office visits: 09/09/22 Rolene Arbour, LPN: Patient presented for AWV.  07/29/22 [name], NP: Patient presented for annual exam. No medication changes.  06/10/2022 [name] NP - Patient was seen for right hip pain and DM follow up. Increased Trulicily to 1.5 mg SQ weekly.    04/28/2022 [name] NP - Patient was seen for Type 2 diabetes mellitus with other specified complication, without long-term current use of insulin and essential hypertension. Increased Trulicily to 1.5 mg SQ weekly. Discontinued Glipizide. Follow up in 3 months.   Recent consult visits: 10/20/22 Etheleen Nicks, RN (endoscopy center): Patient presented for visit prior to colonoscopy.  10/03/22 Jean Rosenthal, MD (ortho): Patient presented for right hip  pain. Cannot pursue surgical intervention unless A1c is < 7.7%. Follow up in 2 months.   09/19/22 Benjiman Core, PA-C (ortho): Patient presented for right hip pain. Follow up in 2 weeks with Dr. Ninfa Linden to discuss surgery.  09/01/22 Antonieta Iba, RD (diabetes education): Patient presented for nutrition follow up.  07/21/22 Benjiman Core, PA-C (ortho): Patient presented for right hip pain. Follow up in 3 months.  06/21/2022 Benjiman Core PA-C (ortho) - Patient was seen for Unilateral primary osteoarthritis, right hip. Referral to Physical Medicine and Rehab. No medication changes. Follow up in 4 weeks.    05/26/2022 Antonieta Iba RD (nutrition): Patient was seen for Type 2 diabetes mellitus with other specified complication, without long-term current use of insulin. No medication changes. Follow up in 2-3 months.   Hospital visits: None in previous 6 months  Objective:  Lab Results  Component Value Date   CREATININE 1.35 07/29/2022   BUN 24 (H) 07/29/2022   GFR 54.04 (L) 07/29/2022   GFRNONAA >60 07/28/2021   GFRAA >60 05/25/2011   NA 140 07/29/2022   K 5.1 07/29/2022   CALCIUM 10.4 07/29/2022   CO2 27 07/29/2022   GLUCOSE 116 (H) 07/29/2022    Lab Results  Component Value Date/Time   HGBA1C 8.0 (H) 07/29/2022 08:35 AM   HGBA1C 7.8 (A) 04/28/2022 08:38 AM   HGBA1C 8.3 (H) 01/27/2022 08:31 AM   HGBA1C 8.1 (A) 01/27/2022 08:04 AM   HGBA1C 9.5 (A) 01/10/2019 08:30 AM   GFR 54.04 (L) 07/29/2022 08:35 AM   GFR 53.75 (L) 01/27/2022 08:31 AM   MICROALBUR 3.9 (H) 07/29/2022 08:35 AM  MICROALBUR 6.6 (H) 06/17/2021 08:31 AM    Last diabetic Eye exam:  Lab Results  Component Value Date/Time   HMDIABEYEEXA No Retinopathy 10/12/2020 12:00 AM    Last diabetic Foot exam: No results found for: "HMDIABFOOTEX"   Lab Results  Component Value Date   CHOL 117 07/29/2022   HDL 60.90 07/29/2022   LDLCALC 45 07/29/2022   TRIG 57.0 07/29/2022   CHOLHDL 2 07/29/2022       Latest Ref Rng & Units  07/29/2022    8:35 AM 08/03/2021    4:30 PM 07/28/2021    1:19 AM  Hepatic Function  Total Protein 6.0 - 8.3 g/dL 7.9  8.1  6.9   Albumin 3.5 - 5.2 g/dL 4.5  4.0  3.1   AST 0 - 37 U/L _0 ALT 0 - 53 U/L 28  36  40   Alk Phosphatase 39 - 117 U/L 81  64  50   Total Bilirubin 0.2 - 1.2 mg/dL 0.4  0.3  0.5     Lab Results  Component Value Date/Time   TSH 2.119 07/10/2021 03:49 AM   TSH 2.92 06/17/2021 08:31 AM   TSH 2.96 05/28/2020 07:46 AM   FREET4 0.92 05/24/2011 01:59 PM       Latest Ref Rng & Units 07/29/2022    8:35 AM 08/03/2021    4:30 PM 07/28/2021    1:19 AM  CBC  WBC 4.0 - 10.5 K/uL 6.4  6.5  8.1   Hemoglobin 13.0 - 17.0 g/dL 15.4  14.0  13.4   Hematocrit 39.0 - 52.0 % 46.2  43.0  39.8   Platelets 150.0 - 400.0 K/uL 169.0  268.0  173     No results found for: "VD25OH"  Clinical ASCVD: No  The ASCVD Risk score (Arnett DK, et al., 2019) failed to calculate for the following reasons:   The valid total cholesterol range is 130 to 320 mg/dL       10/19/2022    2:01 PM 09/09/2022    2:14 PM 07/29/2022    8:06 AM  Depression screen PHQ 2/9  Decreased Interest 0 0 0  Down, Depressed, Hopeless 0 0 0  PHQ - 2 Score 0 0 0      Social History   Tobacco Use  Smoking Status Never  Smokeless Tobacco Never   BP Readings from Last 3 Encounters:  09/19/22 (!) 166/91  07/29/22 (!) 138/98  06/10/22 122/70   Pulse Readings from Last 3 Encounters:  09/19/22 96  07/29/22 73  06/10/22 95   Wt Readings from Last 3 Encounters:  10/20/22 250 lb (113.4 kg)  10/03/22 240 lb (108.9 kg)  09/19/22 240 lb (108.9 kg)   BMI Readings from Last 3 Encounters:  10/20/22 32.10 kg/m  10/03/22 30.81 kg/m  09/19/22 30.81 kg/m    Assessment/Interventions: Review of patient past medical history, allergies, medications, health status, including review of consultants reports, laboratory and other test data, was performed as part of comprehensive evaluation and provision of  chronic care management services.   SDOH:  (Social Determinants of Health) assessments and interventions performed: Yes  SDOH Interventions    Flowsheet Row Chronic Care Management from 10/24/2022 in [place] at [place] from 10/19/2022 in [place] from 09/09/2022 in [place] at [place] from 09/08/2021 in [place] at [place] Management from 11/17/2020 in [place] at [facility] from  09/07/2020 in [employer] at [place] Interventions -- Intervention Not Indicated Intervention Not Indicated -- -- Intervention Not Indicated  Housing Interventions -- -- Intervention Not Indicated Intervention Not Indicated -- Intervention Not Indicated  Transportation Interventions -- _0   Utilities Interventions -- Intervention Not Indicated Intervention Not Indicated -- -- --  Alcohol Usage Interventions -- -- Intervention Not Indicated (Score <7) -- -- --  Depression Interventions/Treatment  -- -- -- -- -- PHQ2-9 Score <4 Follow-up Not Indicated  Financial Strain Interventions Intervention Not Indicated -- Intervention Not Indicated Intervention Not Indicated Intervention Not Indicated Intervention Not Indicated  Physical Activity Interventions -- -- Intervention Not Indicated Intervention Not Indicated -- Intervention Not Indicated  Stress Interventions -- -- Intervention Not Indicated -- -- Intervention Not Indicated  Social Connections Interventions -- -- Intervention Not Indicated Intervention Not Indicated -- Intervention Not Indicated      SDOH Screenings   Food Insecurity: No Food Insecurity (10/19/2022)  Housing: Low Risk  (10/19/2022)  Transportation Needs: No  Transportation Needs (10/19/2022)  Utilities: Not At Risk (10/19/2022)  Alcohol Screen: Low Risk  (09/09/2022)  Depression (PHQ2-9): Low Risk  (10/19/2022)  Financial Resource Strain: Low Risk  (10/24/2022)  Physical Activity: Insufficiently Active (09/09/2022)  Social Connections: Moderately Integrated (09/09/2022)  Stress: No Stress Concern Present (09/09/2022)  Tobacco Use: Low Risk  (10/20/2022)    CCM Care Plan  Allergies  Allergen Reactions   Lisinopril     hyperkalemia   Reglan [Metoclopramide] Anxiety    Patient had bad reaction requiring ativan in addition to benadryl. He doesn't want to receive it any more    Medications Reviewed Today     Reviewed by Etheleen Nicks, RN (Registered Nurse) on 10/20/22 at [place] List Status: <None>   Medication Order Taking? Sig Documenting Provider Last Dose Status Informant  aspirin-acetaminophen-caffeine (EXCEDRIN MIGRAINE) 846-659-93 MG tablet [id number] Yes Take 1 tablet by mouth every 6 (six) hours as needed for headache. [provider] Taking Active Spouse/Significant Other  Blood Glucose Monitoring Suppl (ONE TOUCH ULTRA 2) w/Device KIT [id number] Yes Use the device daily to check your blood sugars 1-4 times as instructed. Nafziger, Tommi Rumps, NP Taking Active Spouse/Significant Other  diltiazem (CARDIZEM CD) 120 MG 24 hr capsule [id number] Yes TAKE 1 CAPSULE BY MOUTH EVERY DAY Nafziger, Cory, NP Taking Active   JARDIANCE 25 MG TABS tablet [id number] Yes TAKE 1 TABLET (25 MG TOTAL) BY MOUTH DAILY. Nafziger, Tommi Rumps, NP Taking Active   Lancets [facility] ULTRASOFT) lancets [id number] Yes Use to check blood sugar BID Nafziger, Tommi Rumps, NP Taking Active Spouse/Significant Other  metFORMIN (GLUCOPHAGE-XR) 500 MG 24 hr tablet [id number] Yes TAKE 2 TABLETS (1,000 MG TOTAL) BY MOUTH IN THE MORNING AND AT BEDTIME. Nafziger, Tommi Rumps, NP Taking Active   Multiple Vitamins-Minerals (CENTRUM SILVER PO) [id number] Yes Take 1 tablet by mouth daily. [provider] Taking Active Spouse/Significant Other  ONETOUCH VERIO test strip [id number] Yes USE TO TEST BLOOD SUGAR TWICE DAILY Nafziger, Tommi Rumps, NP Taking Active   rosuvastatin (CRESTOR) 5 MG tablet [id number] Yes TAKE 1 TABLET BY MOUTH EVERYDAY AT BEDTIME Nafziger, Tommi Rumps, NP Taking Active   TRULICITY 1.5 OI/3.7CW SOPN [id number] Yes INJECT 1.5 MG INTO THE SKIN ONCE A WEEK. Nafziger, Tommi Rumps, NP Taking Active   vitamin B-12 (CYANOCOBALAMIN) 1000 MCG tablet [id number] Yes Take 1 tablet (1,000 mcg total) by mouth  daily. 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   Tick bite 07/20/2021   AMS (altered mental status) 07/10/2021   Marijuana abuse 07/10/2021   Class 1 obesity due to excess calories with body mass index (BMI) of 30.0 to 30.9 in adult 07/10/2021   Routine general medical examination at a health care facility 02/02/2016   Type 2 diabetes mellitus ([place]) 06/12/2012   Syncope - neurally mediated 07/04/2011   Essential hypertension 12/24/2007   HYPERGLYCEMIA 12/24/2007    Immunization History  Administered Date(s) Administered   Fluad Quad(high Dose 65+) 09/11/2019, 09/03/2020   Influenza Split 09/12/2012   Influenza Whole 12/24/2007   Influenza,inj,Quad PF,6+ Mos 02/02/2016, 10/10/2018   PFIZER(Purple Top)SARS-COV-2 Vaccination 02/24/2020, 03/16/2020, 02/25/2021   PNEUMOCOCCAL CONJUGATE-20 07/29/2022   Pneumococcal Conjugate-13 09/11/2019   Pneumococcal Polysaccharide-23 02/25/2016   Td 01/31/2001   Tdap 02/02/2016   Zoster Recombinat (Shingrix) 02/25/2021    -BP?  -Bgs?  -switch to mounjaro or ozempic?  Patient called to have the diltiazem refilled and they were unable to fill it with the pharmacy. [facility]?  Patient is more  constipated with Trulicity   Patient  has a 99 one day last week 99-125. Patient was seeing higher readings for last A1c. Patient does have a smart phone (Android).   Patient reports he is doing better with no constipation with Trulicity. No nausea. Patient   Recommended checking other times of the day a few times a week such as after meals or before bedtime  Patient reports he has enough diltiazem for this week   Flu shot -   Conditions to be addressed/monitored:  Hypertension, Hyperlipidemia and Diabetes  Conditions addressed this visit: Hypertension, diabetes  Care Plan : [address]  Updates made by [name], [place] since 10/24/2022 12:00 AM     Problem: Problem: diabetes, hypertension, hyperlipidemia      Long-Range Goal: Patient-Specific Goal   Start Date: 03/18/2021  Expected End Date: 03/18/2022  Recent Progress: On track  Priority: High  Note:   Current Barriers:  Unable to achieve control of diabetes   Pharmacist Clinical Goal(s):  Patient will achieve adherence to monitoring guidelines and medication adherence to achieve therapeutic efficacy achieve control of diabetes as evidenced by A1c  through collaboration with PharmD and provider.   Interventions: 1:1 collaboration with [name], NP regarding development and update of comprehensive plan of care as evidenced by provider attestation and co-signature Inter-disciplinary care team collaboration (see longitudinal plan of care) Comprehensive medication review performed; medication list updated in electronic medical record  Hypertension (BP goal <130/80) -Not ideally controlled -Current treatment: Diltiazem (Cardizem CD) 129m, 1 capsule once daily - Appropriate, Query effective, Safe, Accessible -Medications previously tried: none  -Current home readings: 140/70 (highest he's had recently), 135/70, 135/60 (arm cuff - did not bring to office); checking 2-3 times a week and brought his cuff  in but it was higher than the cuff  -Current dietary habits: limits salt intake -Current exercise habits: walking almost daily; chopping wood this weekend -Denies hypotensive/hypertensive symptoms -Educated on Exercise goal of 150 minutes per week; Importance of home blood pressure monitoring; -Counseled to monitor BP at home twice weekly, document, and provide log at  future appointments -Counseled on diet and exercise extensively Recommended to continue current medication Consider purchasing a new BP cuff as his was 10-15 mmHg higher than the office cuff.  Hyperlipidemia: (LDL goal < 70) -Controlled -Current treatment: Rosuvastatin 5 mg 1 tablet daily - Appropriate, Effective, Safe, Accessible -Medications previously tried: none  -Current dietary patterns: working on cutting out sweets -Current exercise habits: walking almost daily -Educated on Cholesterol goals;  Benefits of statin for ASCVD risk reduction; Importance of limiting foods high in cholesterol; Exercise goal of 150 minutes per week; -Counseled on diet and exercise extensively Recommended to continue current medication Recommended repeat lipid panel.  Diabetes (A1c goal <7%) -Uncontrolled -Current medications: empagliflozin (Jardiance) 66m, 1 tablet once daily - Appropriate, Query effective, Safe, Accessible Metformin ER 5068m 2 tablets twice daily with a meal - Appropriate, Query effective, Safe, Accessible - taking 1 tablet twice daily Trulicity 1.5 mg inject once weekly - Appropriate, Query effective, Safe, Accessible -Medications previously tried: glipizide (patient preference)  -Current home glucose readings fasting glucose: 99-125  post prandial glucose: 99 -Reports hypoglycemic/hyperglycemic symptoms -Current meal patterns: reading package labels; trying to stay away from sweets and no fried foods breakfast: did not discuss lunch: did not discuss  dinner: did not discuss snacks: trail mix  drinks: not  drinking soda at all and sweet tea; drinking more water -Current exercise: walking a couple times a week, grandsons at the gym on the weekends - has lost a couple of pounds; cutting grass more -Educated on A1c and blood sugar goals; Exercise goal of 150 minutes per week; Carbohydrate counting and/or plate method -Counseled to check feet daily and get yearly eye exams -Counseled on diet and exercise extensively Recommended to continue current medication Recommended checking other times of the day a few times a week such as after meals or before bedtime .  Headaches (Goal: minimize severity of headaches) -Controlled -Current treatment  Excedrin as needed - Appropriate, Effective, Safe, Accessible -Medications previously tried: Fioricet  -Counseled on risk for medication overuse headache with caffeine component and recommended limiting use.  Health Maintenance -Vaccine gaps: COVID booster, influenza -Current therapy:  Vitamin B12 1000 mcg daily -Educated on Cost vs benefit of each product must be carefully weighed by individual consumer -Patient is satisfied with current therapy and denies issues -Recommended to continue as is  Patient Goals/Self-Care Activities Patient will:  - take medications as prescribed check glucose daily, document, and provide at future appointments check blood pressure weekly, document, and provide at future appointments target a minimum of 150 minutes of moderate intensity exercise weekly engage in dietary modifications by cutting back on portion sizes  Follow Up Plan: Telephone follow up appointment with care management team member scheduled for: 4 months       Medication Assistance: None required.  Patient affirms current coverage meets needs.  Compliance/Adherence/Medication fill history: Care Gaps: Shingrix (2nd dose), COVID booster, eye exam (rescheduled for January), foot exam, colonoscopy (scheduled for this month) Last BP - 166/91 on  09/19/2022 Last A1C - 8.0 on 07/29/2022   Star-Rating Drugs: Jardiance 25 mg - [facility] Metformin ER 500 mg - last filled 09/07/2022 [facility] Rosuvastatin 5 mg - last filled 10/12/2022 90 [facility] Trulicity 1.5 mg - last filled 10/11/2022 28 [facility]  [id number] [place] [id number]: [phone]: 3: [phone] pill  box? No - patient reports routine; and taking all meds once a day in the morning. Pt endorses 100% compliance  We discussed: Current pharmacy is preferred with insurance plan and patient is satisfied with pharmacy services Patient decided to: Continue current medication management strategy  Care Plan and Follow Up Patient Decision:  Patient agrees to Care Plan and Follow-up.  Plan: Telephone follow up appointment with care management team member scheduled for:  4 months  [name], PharmD [place] Pharmacist [place] at [place] [phone]

## 2022-10-28 ENCOUNTER — Ambulatory Visit (INDEPENDENT_AMBULATORY_CARE_PROVIDER_SITE_OTHER): Payer: Medicare Other | Admitting: Adult Health

## 2022-10-28 ENCOUNTER — Encounter: Payer: Self-pay | Admitting: Adult Health

## 2022-10-28 VITALS — BP 152/88 | HR 70 | Temp 98.2°F | Wt 248.0 lb

## 2022-10-28 DIAGNOSIS — Z23 Encounter for immunization: Secondary | ICD-10-CM

## 2022-10-28 DIAGNOSIS — E1169 Type 2 diabetes mellitus with other specified complication: Secondary | ICD-10-CM

## 2022-10-28 DIAGNOSIS — I10 Essential (primary) hypertension: Secondary | ICD-10-CM | POA: Diagnosis not present

## 2022-10-28 LAB — POCT GLYCOSYLATED HEMOGLOBIN (HGB A1C): Hemoglobin A1C: 6.7 % — AB (ref 4.0–5.6)

## 2022-10-28 MED ORDER — FREESTYLE LIBRE 3 SENSOR MISC: 6 refills | Status: DC

## 2022-10-28 NOTE — Progress Notes (Signed)
Subjective:    Patient ID: Jason [name], male    DOB: Mar 02, 1956, 66 y.o.   MRN: [id number]  HPI 66 year old male who  has a past medical history of Arthritis, Bell palsy, Diabetes mellitus without complication ([place]), HTN (hypertension), and Hyperlipidemia.  He presents today for 21-monthfollow-up regarding diabetes and hypertension  Diabetes mellitus type II-managed with metformin 1000 mg twice daily, Jardiance 25 mg daily, and Trulicity 1.5 mg weekly.  He does check his blood sugars periodically at home with readings in the 100-125 range.  His appetite has decreased since starting Trulicity.  He has been working on diet, has cut out soda completely and is eating healthier and exercising on a more routine basis. His last A1c was 8.0 three months ago.  Wt Readings from Last 5 Encounters:  10/28/22 248 lb (112.5 kg)  10/20/22 250 lb (113.4 kg)  10/03/22 240 lb (108.9 kg)  09/19/22 240 lb (108.9 kg)  09/09/22 242 lb (109.8 kg)   HTN -managed with Cardizem 180 mg daily.  He does not monitor his blood pressure at home.  Denies dizziness, lightheadedness, chest pain, or shortness of breath. He has been out of his BP medication as the pharmacy has been out of it for the last few days  BP Readings from Last 3 Encounters:  10/28/22 (!) 152/88  09/19/22 (!) 166/91  07/29/22 (!) 138/98   Review of Systems  See HPI  Past Medical History:  Diagnosis Date   Arthritis    Bell palsy    Diabetes mellitus without complication (HCC)    HTN (hypertension)    Hyperlipidemia     Social History   Socioeconomic History   Marital status: Widowed    Spouse name: Not on file   Number of children: 2   Years of education: 12   Highest education level: Not on file  Occupational History   Occupation: transportation    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       Fun: fix cars, drag race    Social Determinants of Health   Financial Resource Strain: Low Risk  (10/24/2022)   Overall Financial Resource Strain (CARDIA)    Difficulty of Paying Living Expenses: Not very hard  Food Insecurity: No Food Insecurity (10/19/2022)   Hunger Vital Sign    Worried About Running Out of Food in the Last Year: Never true    Ran Out of Food in the Last Year: Never true  Transportation Needs: No Transportation Needs (10/19/2022)   PRAPARE - THydrologist(Medical): No    Lack of Transportation (Non-Medical): No  Physical Activity: Insufficiently Active (09/09/2022)   Exercise Vital Sign    Days of Exercise per Week: 3 days    Minutes of Exercise per Session: 30 min  Stress: No Stress Concern Present (09/09/2022)   [place]  Stress Questionnaire    Feeling of Stress : Not at all  Social Connections: Moderately Integrated (09/09/2022)   Social Connection and Isolation Panel [NHANES]    Frequency of Communication with Friends and Family: More than three times a week    Frequency of Social Gatherings with Friends and Family: More than three times a week    Attends Religious Services: More than 4 times per year    Active Member of [employer] or Organizations: Yes    Attends Archivist Meetings: More than 4 times per year    Marital Status: Widowed  Intimate Partner Violence: Not At Risk (09/09/2022)   Humiliation, Afraid, Rape, and Kick questionnaire    Fear of Current or Ex-Partner: No    Emotionally Abused: No    Physically Abused: No    Sexually Abused: No    Past  Surgical History:  Procedure Laterality Date   COLONOSCOPY     COLONOSCOPY W/ POLYPECTOMY      Family History  Problem Relation Age of Onset   Stroke Mother    Colon cancer Sister    High blood pressure Sister    Colon cancer Brother    High blood pressure Brother    Stomach cancer Brother    Lymphoma Son    Colon cancer Other        1st degree relative <60   Diabetes Other        1st degree relative    Stroke Other    Esophageal cancer Neg Hx    Rectal cancer Neg Hx     Allergies  Allergen Reactions   Lisinopril     hyperkalemia   Reglan [Metoclopramide] Anxiety    Patient had bad reaction requiring ativan in addition to benadryl. He doesn't want to receive it any more    Current Outpatient Medications on File Prior to Visit  Medication Sig Dispense Refill   aspirin-acetaminophen-caffeine (EXCEDRIN MIGRAINE) 250-250-65 MG tablet Take 1 tablet by mouth every 6 (six) hours as needed for headache.     Blood Glucose Monitoring Suppl (ONE TOUCH ULTRA 2) w/Device KIT Use the device daily to check your blood sugars 1-4 times as instructed. 1 kit 0   diltiazem (CARDIZEM CD) 120 MG 24 hr capsule TAKE 1 CAPSULE BY MOUTH EVERY DAY 90 capsule 3   JARDIANCE 25 MG TABS tablet TAKE 1 TABLET (25 MG TOTAL) BY MOUTH DAILY. 90 tablet 1   Lancets (ONETOUCH ULTRASOFT) lancets Use to check blood sugar BID 100 each 12   metFORMIN (GLUCOPHAGE-XR) 500 MG 24 hr tablet TAKE 2 TABLETS (1,000 MG TOTAL) BY MOUTH IN THE MORNING AND AT BEDTIME. 360 tablet 0   Multiple Vitamins-Minerals (CENTRUM SILVER PO) Take 1 tablet by mouth daily.     ONETOUCH VERIO test strip USE TO TEST BLOOD SUGAR TWICE DAILY 100 strip 5   rosuvastatin (CRESTOR) 5 MG tablet TAKE 1 TABLET BY MOUTH EVERYDAY AT BEDTIME 90 tablet 3   TRULICITY 1.5 TL/5.7WI SOPN INJECT 1.5 MG INTO THE SKIN ONCE A WEEK. 2 mL 2   vitamin B-12 (CYANOCOBALAMIN) 1000 MCG tablet Take 1 tablet (1,000 mcg total) by mouth daily. 30 tablet 0   No current  facility-administered medications on file prior to visit.    BP (!) 152/88 (BP Location: Left Arm, Patient Position: Sitting, Cuff Size: Large)   Pulse 70   Temp 98.2 F (36.8 C) (Oral)   Wt 248 lb (112.5 kg)   SpO2 99%   BMI 31.84  kg/m       Objective:   Physical Exam Vitals and nursing note reviewed.  Constitutional:      Appearance: Normal appearance. He is obese.  Cardiovascular:     Rate and Rhythm: Normal rate and regular rhythm.     Pulses: Normal pulses.     Heart sounds: Normal heart sounds.  Pulmonary:     Effort: Pulmonary effort is normal.     Breath sounds: Normal breath sounds.  Skin:    General: Skin is warm and dry.  Neurological:     General: No focal deficit present.     Mental Status: He is alert and oriented to person, place, and time.  Psychiatric:        Mood and Affect: Mood normal.        Behavior: Behavior normal.        Thought Content: Thought content normal.        Judgment: Judgment normal.       Assessment & Plan:   1. Type 2 diabetes mellitus with other specified complication, without long-term current use of insulin (HCC)  - POCT HgB A1C- 6.7 - has improved. Continue with current medications  - He is interested in CGM - Follow up in three months  - Continuous Blood Gluc Sensor (FREESTYLE LIBRE 3 SENSOR) MISC; Use to monitor glucose  Dispense: 2 each; Refill: 6  2. Essential hypertension - Not at goal today but he has not had his medication in the last few days.  - He is going to stop by the pharmacy today to see if they have a supply   3. Flu vaccine need  - Flu Vaccine QUAD High Dose(Fluad)   [name], NP

## 2022-11-02 ENCOUNTER — Encounter: Payer: Self-pay | Admitting: Gastroenterology

## 2022-11-06 ENCOUNTER — Encounter: Payer: Self-pay | Admitting: Certified Registered Nurse Anesthetist

## 2022-11-10 ENCOUNTER — Encounter: Payer: Self-pay | Admitting: Gastroenterology

## 2022-11-10 ENCOUNTER — Ambulatory Visit (AMBULATORY_SURGERY_CENTER): Payer: Medicare Other | Admitting: Gastroenterology

## 2022-11-10 VITALS — BP 140/88 | HR 76 | Temp 97.1°F | Resp 12 | Ht 74.0 in | Wt 250.0 lb

## 2022-11-10 DIAGNOSIS — E785 Hyperlipidemia, unspecified: Secondary | ICD-10-CM | POA: Diagnosis not present

## 2022-11-10 DIAGNOSIS — I10 Essential (primary) hypertension: Secondary | ICD-10-CM

## 2022-11-10 DIAGNOSIS — Z09 Encounter for follow-up examination after completed treatment for conditions other than malignant neoplasm: Secondary | ICD-10-CM | POA: Diagnosis not present

## 2022-11-10 DIAGNOSIS — Z800 Family history of malignant neoplasm of digestive organs: Secondary | ICD-10-CM | POA: Diagnosis not present

## 2022-11-10 DIAGNOSIS — Z7985 Long-term (current) use of injectable non-insulin antidiabetic drugs: Secondary | ICD-10-CM | POA: Diagnosis not present

## 2022-11-10 DIAGNOSIS — D122 Benign neoplasm of ascending colon: Secondary | ICD-10-CM | POA: Diagnosis not present

## 2022-11-10 DIAGNOSIS — E1169 Type 2 diabetes mellitus with other specified complication: Secondary | ICD-10-CM | POA: Diagnosis not present

## 2022-11-10 DIAGNOSIS — E119 Type 2 diabetes mellitus without complications: Secondary | ICD-10-CM | POA: Diagnosis not present

## 2022-11-10 DIAGNOSIS — D128 Benign neoplasm of rectum: Secondary | ICD-10-CM | POA: Diagnosis not present

## 2022-11-10 DIAGNOSIS — Z7984 Long term (current) use of oral hypoglycemic drugs: Secondary | ICD-10-CM

## 2022-11-10 DIAGNOSIS — Z86010 Personal history of colonic polyps: Secondary | ICD-10-CM | POA: Diagnosis not present

## 2022-11-10 MED ORDER — SODIUM CHLORIDE 0.9 % IV SOLN: 500.0000 mL | Freq: Once | INTRAVENOUS | Status: DC

## 2022-11-10 NOTE — Progress Notes (Signed)
History and Physical:  This patient presents for endoscopic testing for: Encounter Diagnoses  Name Primary?   Personal history of colonic polyps Yes   Family history of colon cancer     66 year old man here for surveillance colonoscopy. June 2020-subcentimeter tubular adenoma x 15 March 2016-tubular adenoma x 8 2 siblings with colorectal cancer. Patient denies chronic abdominal pain, rectal bleeding, constipation or diarrhea.   Patient is otherwise without complaints or active issues today.   Past Medical History: Past Medical History:  Diagnosis Date   Arthritis    Bell palsy    Diabetes mellitus without complication (HCC)    HTN (hypertension)    Hyperlipidemia      Past Surgical History: Past Surgical History:  Procedure Laterality Date   COLONOSCOPY     COLONOSCOPY W/ POLYPECTOMY      Allergies: Allergies  Allergen Reactions   Lisinopril     hyperkalemia   Reglan [Metoclopramide] Anxiety    Patient had bad reaction requiring ativan in addition to benadryl. He doesn't want to receive it any more    Outpatient Meds: Current Outpatient Medications  Medication Sig Dispense Refill   aspirin-acetaminophen-caffeine (EXCEDRIN MIGRAINE) 250-250-65 MG tablet Take 1 tablet by mouth every 6 (six) hours as needed for headache.     Blood Glucose Monitoring Suppl (ONE TOUCH ULTRA 2) w/Device KIT Use the device daily to check your blood sugars 1-4 times as instructed. 1 kit 0   Continuous Blood Gluc Sensor (FREESTYLE LIBRE 3 SENSOR) MISC Use to monitor glucose 2 each 6   diltiazem (CARDIZEM CD) 120 MG 24 hr capsule TAKE 1 CAPSULE BY MOUTH EVERY DAY 90 capsule 3   JARDIANCE 25 MG TABS tablet TAKE 1 TABLET (25 MG TOTAL) BY MOUTH DAILY. 90 tablet 1   Lancets (ONETOUCH ULTRASOFT) lancets Use to check blood sugar BID 100 each 12   metFORMIN (GLUCOPHAGE-XR) 500 MG 24 hr tablet TAKE 2 TABLETS (1,000 MG TOTAL) BY MOUTH IN THE MORNING AND AT BEDTIME. 360 tablet 0   Multiple  Vitamins-Minerals (CENTRUM SILVER PO) Take 1 tablet by mouth daily.     ONETOUCH VERIO test strip USE TO TEST BLOOD SUGAR TWICE DAILY 100 strip 5   rosuvastatin (CRESTOR) 5 MG tablet TAKE 1 TABLET BY MOUTH EVERYDAY AT BEDTIME 90 tablet 3   TRULICITY 1.5 TG/6.2IR SOPN INJECT 1.5 MG INTO THE SKIN ONCE A WEEK. 2 mL 2   vitamin B-12 (CYANOCOBALAMIN) 1000 MCG tablet Take 1 tablet (1,000 mcg total) by mouth daily. 30 tablet 0   Current Facility-Administered Medications  Medication Dose Route Frequency Provider Last Rate Last Admin   0.9 %  sodium chloride infusion  500 mL Intravenous Once Danis, Estill Cotta III, MD          ___________________________________________________________________ Objective   Exam:  BP (!) 136/93   Pulse 95   Temp (!) 97.1 F (36.2 C) (Temporal)   Ht _0  (1.88 m)   Wt 250 lb (113.4 kg)   SpO2 98%   BMI 32.10 kg/m   CV: regular , S1/S2 Resp: clear to auscultation bilaterally, normal RR and effort noted GI: soft, no tenderness, with active bowel sounds.   Assessment: Encounter Diagnoses  Name Primary?   Personal history of colonic polyps Yes   Family history of colon cancer      Plan: Colonoscopy  The benefits and risks of the planned procedure were described in detail with the patient or (when appropriate) their health care proxy.  Risks were  outlined as including, but not limited to, bleeding, infection, perforation, adverse medication reaction leading to cardiac or pulmonary decompensation, pancreatitis (if ERCP).  The limitation of incomplete mucosal visualization was also discussed.  No guarantees or warranties were given.    The patient is appropriate for an endoscopic procedure in the ambulatory setting.   - Wilfrid Lund, MD

## 2022-11-10 NOTE — Progress Notes (Signed)
Called to room to assist during endoscopic procedure.  Patient ID and intended procedure confirmed with present staff. Received instructions for my participation in the procedure from the performing physician.  

## 2022-11-10 NOTE — Op Note (Signed)
[facility] Patient Name: Jason [name] Procedure Date: 11/10/2022 8:58 AM MRN: [id number] Endoscopist: Mallie Mussel [name] , MD, [id number] Age: 66 Referring MD:  Date of Birth: [birth year] Gender: Male Account #: [id number] Procedure:                Colonoscopy Indications:              Screening in patient at increased risk: Family                            history of 1st-degree relative with colorectal                            cancer (2 siblings), Surveillance: Personal history                            of adenomatous polyps on last colonoscopy 3 years                            ago                           TA < 19m x 16 May 2019                           TA x 19 Mar 2016 Medicines:                Monitored Anesthesia Care Procedure:                Pre-Anesthesia Assessment:                           - Prior to the procedure, a History and Physical                            was performed, and patient medications and                            allergies were reviewed. The patient's tolerance of                            previous anesthesia was also reviewed. The risks                            and benefits of the procedure and the sedation                            options and risks were discussed with the patient.                            All questions were answered, and informed consent                            was obtained. Prior Anticoagulants: The patient has  taken no anticoagulant or antiplatelet agents. ASA                            Grade Assessment: II - A patient with mild systemic                            disease. After reviewing the risks and benefits,                            the patient was deemed in satisfactory condition to                            undergo the procedure.                           After obtaining informed consent, the colonoscope                            was passed under direct vision.  Throughout the                            procedure, the patient's blood pressure, pulse, and                            oxygen saturations were monitored continuously. The                            CF [id number] #[id number] was introduced through the anus                            and advanced to the the cecum, identified by                            appendiceal orifice and ileocecal valve. The                            colonoscopy was performed without difficulty. The                            patient tolerated the procedure well. The quality                            of the bowel preparation was excellent. The                            ileocecal valve, appendiceal orifice, and rectum                            were photographed. Scope In: 9:03:39 AM Scope Out: 9:24:18 AM Scope Withdrawal Time: 0 hours 18 minutes 30 seconds  Total Procedure Duration: 0 hours 20 minutes 39 seconds  Findings:                 The perianal and digital rectal examinations were  normal.                           Two sessile polyps were found in the rectum and                            ascending colon. The polyps were diminutive in                            size. These polyps were removed with a cold snare.                            Resection and retrieval were complete.                           Repeat examination of right colon under NBI                            performed.                           A 10-12 mm polyp was found in the ascending colon.                            The polyp was pedunculated. The polyp was removed                            with a hot snare. Resection and retrieval were                            complete.                           Multiple small-mouthed diverticula were found in                            the left colon.                           Internal hemorrhoids were found. The hemorrhoids                            were small.                            Anal papilla(e) were hypertrophied.                           The exam was otherwise without abnormality on                            direct and retroflexion views. Complications:            No immediate complications. Estimated Blood Loss:     Estimated blood loss was minimal. Impression:               - Two diminutive polyps in the rectum  and in the                            ascending colon, removed with a cold snare.                            Resected and retrieved.                           - One 10-12 mm polyp in the ascending colon,                            removed with a hot snare. Resected and retrieved.                           - Diverticulosis in the left colon.                           - Internal hemorrhoids.                           - Anal papilla(e) were hypertrophied.                           - The examination was otherwise normal on direct                            and retroflexion views. Recommendation:           - Patient has a contact number available for                            emergencies. The signs and symptoms of potential                            delayed complications were discussed with the                            patient. Return to normal activities tomorrow.                            Written discharge instructions were provided to the                            patient.                           - Resume previous diet.                           - Continue present medications.                           - Await pathology results.                           - Repeat colonoscopy in 3 years for surveillance. Li Bobo L.  Loletha Carrow, MD 11/10/2022 9:29:51 AM This report has been signed electronically.

## 2022-11-10 NOTE — Progress Notes (Signed)
Sedate, gd SR, tolerated procedure well, VSS, report to RN 

## 2022-11-10 NOTE — Patient Instructions (Signed)
Handout on hemorrhoids, diverticulosis, and polyps given to patient. Await pathology results. Resume previous diet and continue present medications. Repeat colonoscopy for surveillance in 3 years!  YOU HAD AN ENDOSCOPIC PROCEDURE TODAY AT [facility]:   Refer to the procedure report that was given to you for any specific questions about what was found during the examination.  If the procedure report does not answer your questions, please call your gastroenterologist to clarify.  If you requested that your care partner not be given the details of your procedure findings, then the procedure report has been included in a sealed envelope for you to review at your convenience later.  YOU SHOULD EXPECT: Some feelings of bloating in the abdomen. Passage of more gas than usual.  Walking can help get rid of the air that was put into your GI tract during the procedure and reduce the bloating. If you had a lower endoscopy (such as a colonoscopy or flexible sigmoidoscopy) you may notice spotting of blood in your stool or on the toilet paper. If you underwent a bowel prep for your procedure, you may not have a normal bowel movement for a few days.  Please Note:  You might notice some irritation and congestion in your nose or some drainage.  This is from the oxygen used during your procedure.  There is no need for concern and it should clear up in a day or so.  SYMPTOMS TO REPORT IMMEDIATELY:  Following lower endoscopy (colonoscopy or flexible sigmoidoscopy):  Excessive amounts of blood in the stool  Significant tenderness or worsening of abdominal pains  Swelling of the abdomen that is new, acute  Fever of 100F or higher  For urgent or emergent issues, a gastroenterologist can be reached at any hour by calling [phone]. Do not use MyChart messaging for urgent concerns.    DIET:  We do recommend a small meal at first, but then you may proceed to your regular diet.  Drink plenty of  fluids but you should avoid alcoholic beverages for 24 hours.  ACTIVITY:  You should plan to take it easy for the rest of today and you should NOT DRIVE or use heavy machinery until tomorrow (because of the sedation medicines used during the test).    FOLLOW UP: Our staff will call the number listed on your records the next business day following your procedure.  We will call around 7:15- 8:00 am to check on you and address any questions or concerns that you may have regarding the information given to you following your procedure. If we do not reach you, we will leave a message.     If any biopsies were taken you will be contacted by phone or by letter within the next 1-3 weeks.  Please call us at [phone] if you have not heard about the biopsies in 3 weeks.    SIGNATURES/CONFIDENTIALITY: You and/or your care partner have signed paperwork which will be entered into your electronic medical record.  These signatures attest to the fact that that the information above on your After Visit Summary has been reviewed and is understood.  Full responsibility of the confidentiality of this discharge information lies with you and/or your care-partner.

## 2022-11-10 NOTE — Progress Notes (Signed)
Pt's states no medical or surgical changes since previsit or office visit. 

## 2022-11-11 ENCOUNTER — Telehealth: Payer: Self-pay

## 2022-11-11 NOTE — Telephone Encounter (Signed)
  Follow up Call-     11/10/2022    8:21 AM  Call back number  Post procedure Call Back phone  # [phone]  Permission to leave phone message Yes     Patient questions:  Do you have a fever, pain , or abdominal swelling? No. Pain Score  0 *  Have you tolerated food without any problems? Yes.    Have you been able to return to your normal activities? Yes.    Do you have any questions about your discharge instructions: Diet   No. Medications  No. Follow up visit  No.  Do you have questions or concerns about your Care? No.  Actions: * If pain score is 4 or above: No action needed, pain <4.

## 2022-11-16 ENCOUNTER — Encounter: Payer: Self-pay | Admitting: Gastroenterology

## 2022-12-07 ENCOUNTER — Ambulatory Visit: Payer: Medicare Other | Admitting: Orthopaedic Surgery

## 2022-12-07 ENCOUNTER — Encounter: Payer: Self-pay | Admitting: Orthopaedic Surgery

## 2022-12-07 DIAGNOSIS — M1611 Unilateral primary osteoarthritis, right hip: Secondary | ICD-10-CM

## 2022-12-07 DIAGNOSIS — M25551 Pain in right hip: Secondary | ICD-10-CM

## 2022-12-07 NOTE — Progress Notes (Signed)
The patient is here in follow-up as a relates to his right hip pain and known arthritis of the right hip.  Back 4 months ago his hemoglobin A1c was 8.0.  A month ago was down to 6.7.  He feels healthier and he is on a better path for healthier lifestyle.  He is 66 years old.  On exam he does have groin pain and stiffness with internal and external rotation of that right hip that has known well-documented arthritis.  However he said the pain right now is manageable and he wants to continue to work on his health before considering hip replacement surgery.  I agree with this as well.  We talked about things to avoid in terms of keeping his hip as healthy as possible.  He will avoid high-impact aerobic activities and exercise walking involving heels.  If it flares up on him enough to proceed with surgery he will let us know.  All questions and concerns were answered and addressed.

## 2022-12-21 ENCOUNTER — Other Ambulatory Visit: Payer: Self-pay | Admitting: Adult Health

## 2022-12-21 DIAGNOSIS — E1169 Type 2 diabetes mellitus with other specified complication: Secondary | ICD-10-CM

## 2022-12-22 ENCOUNTER — Ambulatory Visit: Payer: Medicare Other | Admitting: Dietician

## 2022-12-27 ENCOUNTER — Telehealth: Payer: Self-pay | Admitting: Adult Health

## 2022-12-27 NOTE — Telephone Encounter (Signed)
error 

## 2022-12-28 DIAGNOSIS — H35033 Hypertensive retinopathy, bilateral: Secondary | ICD-10-CM | POA: Diagnosis not present

## 2022-12-28 DIAGNOSIS — E119 Type 2 diabetes mellitus without complications: Secondary | ICD-10-CM | POA: Diagnosis not present

## 2022-12-28 DIAGNOSIS — H2513 Age-related nuclear cataract, bilateral: Secondary | ICD-10-CM | POA: Diagnosis not present

## 2022-12-28 LAB — HM DIABETES EYE EXAM

## 2023-01-11 ENCOUNTER — Other Ambulatory Visit: Payer: Self-pay | Admitting: Adult Health

## 2023-01-16 ENCOUNTER — Other Ambulatory Visit: Payer: Self-pay | Admitting: Adult Health

## 2023-01-16 DIAGNOSIS — E1169 Type 2 diabetes mellitus with other specified complication: Secondary | ICD-10-CM

## 2023-01-31 ENCOUNTER — Ambulatory Visit (INDEPENDENT_AMBULATORY_CARE_PROVIDER_SITE_OTHER): Payer: Medicare Other | Admitting: Adult Health

## 2023-01-31 ENCOUNTER — Encounter: Payer: Self-pay | Admitting: Adult Health

## 2023-01-31 VITALS — BP 132/84 | HR 76 | Temp 97.7°F | Ht 74.0 in | Wt 242.0 lb

## 2023-01-31 DIAGNOSIS — I10 Essential (primary) hypertension: Secondary | ICD-10-CM | POA: Diagnosis not present

## 2023-01-31 DIAGNOSIS — E1169 Type 2 diabetes mellitus with other specified complication: Secondary | ICD-10-CM | POA: Diagnosis not present

## 2023-01-31 LAB — POCT GLYCOSYLATED HEMOGLOBIN (HGB A1C): Hemoglobin A1C: 8 % — AB (ref 4.0–5.6)

## 2023-01-31 MED ORDER — TRULICITY 3 MG/0.5ML ~~LOC~~ SOAJ: 3.0000 mg | SUBCUTANEOUS | 2 refills | Status: DC

## 2023-01-31 NOTE — Progress Notes (Signed)
Subjective:    Patient ID: Jason [name], male    DOB: 06/08/1956, 67 y.o.   MRN: [id number]  HPI  67 year old male who  has a past medical history of Arthritis, Bell palsy, Diabetes mellitus without complication ([place]), HTN (hypertension), and Hyperlipidemia.  He presents to the office today for follow up regarding   Diabetes mellitus type II-managed with metformin 1000 mg twice daily, Jardiance 25 mg daily, and Trulicity 1.5 mg weekly. He reports that he has not been able to get Trulicity as it is on " back order".  He does check his blood sugars periodically at home with readings in the 100-150 range with an average of 137.   He has been working on diet, has cut out soda completely and is eating healthier and exercising on a more routine basis.  Lab Results  Component Value Date   HGBA1C 8.0 (A) 01/31/2023   Wt Readings from Last 3 Encounters:  01/31/23 242 lb (109.8 kg)  11/10/22 250 lb (113.4 kg)  10/28/22 248 lb (112.5 kg)   HTN -managed with Cardizem 120 mg daily.  He does not monitor his blood pressure at home.  Denies dizziness, lightheadedness, chest pain, or shortness of breath. He has been out of his BP medication as the pharmacy has been out of it for the last few days  BP Readings from Last 3 Encounters:  01/31/23 132/84  11/10/22 (!) 140/88  10/28/22 (!) 152/88   Review of Systems See HPI   Past Medical History:  Diagnosis Date   Arthritis    Bell palsy    Diabetes mellitus without complication ([place])    HTN (hypertension)    Hyperlipidemia     Social History   Socioeconomic History   Marital status: Widowed    Spouse name: Not on file   Number of children: 2   Years of education: 12   Highest education level: Not on file  Occupational History   Occupation: transportation    Comment: Publishing rights manager  Tobacco Use   Smoking status: Never   Smokeless tobacco: Never  Vaping Use   Vaping Use: Never used  Substance and Sexual Activity    Alcohol use: Yes    Alcohol/week: 6.0 standard drinks of alcohol    Types: 6 Cans of beer per week    Comment: on weekends   Drug use: Never   Sexual activity: Not on file  Other Topics Concern   Not on file  Social History Narrative      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       Fun: fix cars, drag race    Social Determinants of Health   Financial Resource Strain: Low Risk  (10/24/2022)   Overall Financial Resource Strain (CARDIA)    Difficulty of Paying Living Expenses: Not very hard  Food Insecurity: No Food Insecurity (10/19/2022)   Hunger Vital Sign    Worried About Running Out of Food in the Last Year: Never true    Ran Out of Food in the Last Year: Never true  Transportation Needs: No Transportation Needs (10/19/2022)   PRAPARE - Hydrologist (Medical): No    Lack of Transportation (Non-Medical): No  Physical Activity: Insufficiently Active (09/09/2022)   Exercise Vital Sign    Days of Exercise per Week: 3 days    Minutes of Exercise per Session: 30 min  Stress: No Stress Concern  Present (09/09/2022)   [place]    Feeling of Stress : Not at all  Social Connections: Moderately Integrated (09/09/2022)   Social Connection and Isolation Panel [NHANES]    Frequency of Communication with Friends and Family: More than three times a week    Frequency of Social Gatherings with Friends and Family: More than three times a week    Attends Religious Services: More than 4 times per year    Active Member of [employer] or Organizations: Yes    Attends Archivist Meetings: More than 4 times per year    Marital Status: Widowed  Intimate Partner Violence: Not At Risk (09/09/2022)   Humiliation, Afraid, Rape, and Kick questionnaire    Fear of Current or Ex-Partner: No    Emotionally Abused: No    Physically Abused: No    Sexually Abused: No    Past  Surgical History:  Procedure Laterality Date   COLONOSCOPY     COLONOSCOPY W/ POLYPECTOMY      Family History  Problem Relation Age of Onset   Stroke Mother    Colon cancer Sister    High blood pressure Sister    Colon cancer Brother    High blood pressure Brother    Stomach cancer Brother    Lymphoma Son    Colon cancer Other        1st degree relative <60   Diabetes Other        1st degree relative    Stroke Other    Esophageal cancer Neg Hx    Rectal cancer Neg Hx     Allergies  Allergen Reactions   Lisinopril     hyperkalemia   Reglan [Metoclopramide] Anxiety    Patient had bad reaction requiring ativan in addition to benadryl. He doesn't want to receive it any more    Current Outpatient Medications on File Prior to Visit  Medication Sig Dispense Refill   aspirin-acetaminophen-caffeine (EXCEDRIN MIGRAINE) 250-250-65 MG tablet Take 1 tablet by mouth every 6 (six) hours as needed for headache.     Blood Glucose Monitoring Suppl (ONE TOUCH ULTRA 2) w/Device KIT Use the device daily to check your blood sugars 1-4 times as instructed. 1 kit 0   Continuous Blood Gluc Sensor (FREESTYLE LIBRE 3 SENSOR) MISC Use to monitor glucose 2 each 6   diltiazem (CARDIZEM CD) 120 MG 24 hr capsule TAKE 1 CAPSULE BY MOUTH EVERY DAY 90 capsule 3   Dulaglutide (TRULICITY) 1.5 0000000 SOPN INJECT 1.5 MG INTO THE SKIN ONCE A WEEK. 2 mL 2   JARDIANCE 25 MG TABS tablet TAKE 1 TABLET (25 MG TOTAL) BY MOUTH DAILY. 90 tablet 1   Lancets (ONETOUCH ULTRASOFT) lancets Use to check blood sugar BID 100 each 12   metFORMIN (GLUCOPHAGE-XR) 500 MG 24 hr tablet TAKE 2 TABLETS BY MOUTH IN THE MORNING AND AT BEDTIME 90 tablet 0   Multiple Vitamins-Minerals (CENTRUM SILVER PO) Take 1 tablet by mouth daily.     ONETOUCH VERIO test strip USE TO TEST BLOOD SUGAR TWICE DAILY 100 strip 5   rosuvastatin (CRESTOR) 5 MG tablet TAKE 1 TABLET BY MOUTH EVERYDAY AT BEDTIME 90 tablet 3   vitamin B-12 (CYANOCOBALAMIN) 1000  MCG tablet Take 1 tablet (1,000 mcg total) by mouth daily. 30 tablet 0   No current facility-administered medications on file prior to visit.    BP 132/84   Pulse 76   Temp 97.7 F (36.5 C) (  Oral)   Ht 6' 2"$  (1.88 m)   Wt 242 lb (109.8 kg)   SpO2 98%   BMI 31.07 kg/m       Objective:   Physical Exam Vitals and nursing note reviewed.  Constitutional:      Appearance: Normal appearance. He is obese.  Cardiovascular:     Rate and Rhythm: Normal rate and regular rhythm.     Pulses: Normal pulses.     Heart sounds: Normal heart sounds.  Pulmonary:     Effort: Pulmonary effort is normal.     Breath sounds: Normal breath sounds.  Skin:    General: Skin is warm and dry.  Neurological:     General: No focal deficit present.     Mental Status: He is alert and oriented to person, place, and time.  Psychiatric:        Mood and Affect: Mood normal.        Behavior: Behavior normal.        Thought Content: Thought content normal.        Judgment: Judgment normal.       Assessment & Plan:  1. Type 2 diabetes mellitus with other specified complication, without long-term current use of insulin (HCC) - has been without Trulicity for 2 months and A1c increased. Will call pharmacy today and find out what is going on.  - will increase to 3.0 mg weekly  - Follow up in 3 months - POC HgB A1c-8.0    2. Essential hypertension - Well controlled. No change in medication   [name], NP

## 2023-03-20 ENCOUNTER — Telehealth: Payer: Self-pay

## 2023-03-20 NOTE — Progress Notes (Unsigned)
Care Management & Coordination Services Pharmacy Team  Reason for Encounter: Diabetes  Contacted patient to discuss diabetes disease state. {US HC Outreach:28874}  SCHEDULE FOLLOW UP july  Current antihyperglycemic regimen:  Trulicity 3 mg sq weekly Jardiance 25 mg daily Metformin 500 mg 2 tablets twice daily  Patient verbally confirms he is taking the above medications as directed. {yes/no:20286}  What diet changes have been made to improve diabetes control?***  What recent interventions/DTPs have been made to improve glycemic control:  Increased Dulaglutide to 3mg  weekly.  Have there been any recent hospitalizations or ED visits since last visit with PharmD? No recent hospital visits  Patient {reports/denies:24182} hypoglycemic symptoms, including {Hypoglycemic Symptoms:3049003}  Patient {reports/denies:24182} hyperglycemic symptoms, including {symptoms; hyperglycemia:17903}  How often are you checking your blood sugar? {BG Testing frequency:23922}  What are your blood sugars ranging?  Fasting: *** Before meals: *** After meals: *** Bedtime: ***  During the week, how often does your blood glucose drop below 70? {LowBGfrequency:24142}  Are you checking your feet daily/regularly? {yes/no:20286}  Adherence Review: Is the patient currently on a STATIN medication? Yes Is the patient currently on ACE/ARB medication? No Does the patient have >5 day gap between last estimated fill dates? No  Care Gaps: AWV - completed 02/29/2023, scheduled 09/13/2023 Last eye exam - 12/18/2022 Last foot exam - 06/17/2021 Last BP - 132/84 on 01/31/2023 Last A1C - 8.0 on 01/31/2023 Shingrix - overdue Covid - overdue   Star Rating Drug: Jardiance 25 mg - last filled 01/13/2023 90 [facility] Metformin ER 500 mg - last filled 01/17/2023 90 [facility] Rosuvastatin 5 mg - last filled 01/12/2023 90 [facility] Trulicity 3 mg - last filled 03/16/2023 28 [facility]   Chart Updates:  Recent  office visits:  01/31/2023 [name] - Patient was seen for Type 2 diabetes mellitus with other specified complication, without long-term current use of insulin and an additional concern.Increased Dulaglutide to 3mg  weekly.  10/28/2022 [name] - Patient was seen for Type 2 diabetes mellitus with other specified complication, without long-term current use of insulin and additional concerns. No medication changes.   Recent consult visits:  12/07/2022 Doneen Poisson MD (ortho) - Patient was seen for pain of right hip and unilateral primary osteoarthritis right hip. No medication changes.   Hospital visits:  None  Medications: Outpatient Encounter Medications as of 03/20/2023  Medication Sig   aspirin-acetaminophen-caffeine (EXCEDRIN MIGRAINE) 250-250-65 MG tablet Take 1 tablet by mouth every 6 (six) hours as needed for headache.   Blood Glucose Monitoring Suppl (ONE TOUCH ULTRA 2) w/Device KIT Use the device daily to check your blood sugars 1-4 times as instructed.   Continuous Blood Gluc Sensor (FREESTYLE LIBRE 3 SENSOR) MISC Use to monitor glucose   diltiazem (CARDIZEM CD) 120 MG 24 hr capsule TAKE 1 CAPSULE BY MOUTH EVERY DAY   Dulaglutide (TRULICITY) 1.5 MG/0.5ML SOPN INJECT 1.5 MG INTO THE SKIN ONCE A WEEK.   Dulaglutide (TRULICITY) 3 MG/0.5ML SOPN Inject 3 mg as directed once a week.   JARDIANCE 25 MG TABS tablet TAKE 1 TABLET (25 MG TOTAL) BY MOUTH DAILY.   Lancets (ONETOUCH ULTRASOFT) lancets Use to check blood sugar BID   metFORMIN (GLUCOPHAGE-XR) 500 MG 24 hr tablet TAKE 2 TABLETS BY MOUTH IN THE MORNING AND AT BEDTIME   Multiple Vitamins-Minerals (CENTRUM SILVER PO) Take 1 tablet by mouth daily.   ONETOUCH VERIO test strip USE TO TEST BLOOD SUGAR TWICE DAILY   rosuvastatin (  CRESTOR) 5 MG tablet TAKE 1 TABLET BY MOUTH EVERYDAY AT BEDTIME   vitamin B-12 (CYANOCOBALAMIN) 1000 MCG tablet Take 1 tablet (1,000 mcg total) by mouth daily.   No facility-administered  encounter medications on file as of 03/20/2023.  Fill History:  Dispensed Days Supply Quantity Provider Pharmacy  DILTIAZEM 24H ER(CD) 120 MG CP 02/18/2023 90 90 each      Dispensed Days Supply Quantity Provider Pharmacy  TRULICITY 3 MG/0.5 ML PEN 03/16/2023 28 2 mL      Dispensed Days Supply Quantity Provider Pharmacy  JARDIANCE 25 MG TABLET 01/13/2023 90 90 each      Dispensed Days Supply Quantity Provider Pharmacy  METFORMIN HYDROCHLORIDE ER  500 MG TB24 01/17/2023 23 90 tablet      Dispensed Days Supply Quantity Provider Pharmacy  ROSUVASTATIN CALCIUM 5 MG TAB 01/12/2023 90 90 each      Recent Relevant Labs: Lab Results  Component Value Date/Time   HGBA1C 8.0 (A) 01/31/2023 08:06 AM   HGBA1C 6.7 (A) 10/28/2022 07:41 AM   HGBA1C 8.0 (H) 07/29/2022 08:35 AM   HGBA1C 8.3 (H) 01/27/2022 08:31 AM   HGBA1C 9.5 (A) 01/10/2019 08:30 AM   MICROALBUR 3.9 (H) 07/29/2022 08:35 AM   MICROALBUR 6.6 (H) 06/17/2021 08:31 AM    Kidney Function Lab Results  Component Value Date/Time   CREATININE 1.35 07/29/2022 08:35 AM   CREATININE 1.36 01/27/2022 08:31 AM   GFR 54.04 (L) 07/29/2022 08:35 AM   GFRNONAA >60 07/28/2021 09:16 AM   GFRAA >60 05/25/2011 04:49 AM   Inetta Fermo CMA  Clinical Pharmacist Assistant [phone]

## 2023-04-10 ENCOUNTER — Other Ambulatory Visit: Payer: Self-pay | Admitting: Adult Health

## 2023-04-10 DIAGNOSIS — E1169 Type 2 diabetes mellitus with other specified complication: Secondary | ICD-10-CM

## 2023-04-22 ENCOUNTER — Other Ambulatory Visit: Payer: Self-pay | Admitting: Adult Health

## 2023-04-22 DIAGNOSIS — E1169 Type 2 diabetes mellitus with other specified complication: Secondary | ICD-10-CM

## 2023-04-26 ENCOUNTER — Other Ambulatory Visit: Payer: Self-pay | Admitting: Adult Health

## 2023-04-26 DIAGNOSIS — E1169 Type 2 diabetes mellitus with other specified complication: Secondary | ICD-10-CM

## 2023-04-28 ENCOUNTER — Other Ambulatory Visit: Payer: Self-pay | Admitting: Adult Health

## 2023-04-28 MED ORDER — SEMAGLUTIDE(0.25 OR 0.5MG/DOS) 2 MG/3ML ~~LOC~~ SOPN: 0.5000 mg | PEN_INJECTOR | SUBCUTANEOUS | 0 refills | Status: DC

## 2023-04-28 NOTE — Telephone Encounter (Signed)
Spoke to pt and he stated that he has been out for 2 weeks

## 2023-04-28 NOTE — Telephone Encounter (Signed)
Called pt and pharmacy no answer. Will try again later.

## 2023-04-28 NOTE — Telephone Encounter (Signed)
Pt notified of update and verbalized understanding.  

## 2023-04-28 NOTE — Telephone Encounter (Signed)
Called pt again no answer

## 2023-05-04 ENCOUNTER — Ambulatory Visit (INDEPENDENT_AMBULATORY_CARE_PROVIDER_SITE_OTHER): Payer: Medicare Other | Admitting: Adult Health

## 2023-05-04 ENCOUNTER — Encounter: Payer: Self-pay | Admitting: Adult Health

## 2023-05-04 VITALS — BP 120/72 | HR 66 | Temp 98.1°F | Ht 74.0 in | Wt 247.0 lb

## 2023-05-04 DIAGNOSIS — E119 Type 2 diabetes mellitus without complications: Secondary | ICD-10-CM

## 2023-05-04 DIAGNOSIS — Z7984 Long term (current) use of oral hypoglycemic drugs: Secondary | ICD-10-CM

## 2023-05-04 DIAGNOSIS — E1169 Type 2 diabetes mellitus with other specified complication: Secondary | ICD-10-CM

## 2023-05-04 DIAGNOSIS — I10 Essential (primary) hypertension: Secondary | ICD-10-CM | POA: Diagnosis not present

## 2023-05-04 DIAGNOSIS — Z7985 Long-term (current) use of injectable non-insulin antidiabetic drugs: Secondary | ICD-10-CM | POA: Diagnosis not present

## 2023-05-04 DIAGNOSIS — E6609 Other obesity due to excess calories: Secondary | ICD-10-CM

## 2023-05-04 DIAGNOSIS — Z6830 Body mass index (BMI) 30.0-30.9, adult: Secondary | ICD-10-CM

## 2023-05-04 LAB — POCT GLYCOSYLATED HEMOGLOBIN (HGB A1C): Hemoglobin A1C: 7.3 % — AB (ref 4.0–5.6)

## 2023-05-04 MED ORDER — METFORMIN HCL ER 500 MG PO TB24
1000.0000 mg | ORAL_TABLET | Freq: Two times a day (BID) | ORAL | 1 refills | Status: AC
Start: 2023-05-04 — End: 2023-08-02

## 2023-05-04 NOTE — Progress Notes (Signed)
Subjective:    Patient ID: Jason [name], male    DOB: 1955/12/25, 67 y.o.   MRN: [id number]  HPI 67 year old male who   has a past medical history of Arthritis, Bell palsy, Diabetes mellitus without complication (HCC), HTN (hypertension), and Hyperlipidemia.  He presents to the office today for three month follow up regarding DM and HTN   Diabetes mellitus type II-managed with metformin 1000 mg twice daily, Jardiance 25 mg daily, and Ozempic 0.5 mg weekly ( this was his first dose of the 0.5, he was switched from Trulicity due to [employer].  He does check his blood sugars periodically at home with readings in the 100-170 range.   He has been working on diet, has cut out soda completely and is eating healthier and exercising on a more routine basis.  Lab Results  Component Value Date   HGBA1C 8.0 (A) 01/31/2023   Wt Readings from Last 3 Encounters:  05/04/23 247 lb (112 kg)  01/31/23 242 lb (109.8 kg)  11/10/22 250 lb (113.4 kg)    HTN -managed with Cardizem 120 mg daily.  He does not monitor his blood pressure at home.  Denies dizziness, lightheadedness, chest pain, or shortness of breath. He has been out of his BP medication as the pharmacy has been out of it for the last few days  BP Readings from Last 3 Encounters:  05/04/23 120/72  01/31/23 132/84  11/10/22 (!) 140/88    Review of Systems See HPI   Past Medical History:  Diagnosis Date   Arthritis    Bell palsy    Diabetes mellitus without complication (HCC)    HTN (hypertension)    Hyperlipidemia     Social History   Socioeconomic History   Marital status: Widowed    Spouse name: Not on file   Number of children: 2   Years of education: 12   Highest education level: Not on file  Occupational History   Occupation: transportation    Comment: Advertising account planner  Tobacco Use   Smoking status: Never   Smokeless tobacco: Never  Vaping Use   Vaping Use: Never used  Substance and Sexual  Activity   Alcohol use: Yes    Alcohol/week: 6.0 standard drinks of alcohol    Types: 6 Cans of beer per week    Comment: on weekends   Drug use: Never   Sexual activity: Not on file  Other Topics Concern   Not on file  Social History Narrative      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       Fun: fix cars, drag race    Social Determinants of Health   Financial Resource Strain: Low Risk  (10/24/2022)   Overall Financial Resource Strain (CARDIA)    Difficulty of Paying Living Expenses: Not very hard  Food Insecurity: No Food Insecurity (10/19/2022)   Hunger Vital Sign    Worried About Running Out of Food in the Last Year: Never true    Ran Out of Food in the Last Year: Never true  Transportation Needs: No Transportation Needs (10/19/2022)   PRAPARE - Administrator, Civil Service (Medical): No    Lack of Transportation (Non-Medical): No  Physical Activity: Insufficiently Active (09/09/2022)   Exercise Vital Sign    Days of Exercise per Week: 3 days    Minutes of Exercise per Session: 30 min  Stress: No Stress  Concern Present (09/09/2022)   [employer] of Occupational Health - Occupational Stress Questionnaire    Feeling of Stress : Not at all  Social Connections: Moderately Integrated (09/09/2022)   Social Connection and Isolation Panel [NHANES]    Frequency of Communication with Friends and Family: More than three times a week    Frequency of Social Gatherings with Friends and Family: More than three times a week    Attends Religious Services: More than 4 times per year    Active Member of [employer] or Organizations: Yes    Attends Banker Meetings: More than 4 times per year    Marital Status: Widowed  Intimate Partner Violence: Not At Risk (09/09/2022)   Humiliation, Afraid, Rape, and Kick questionnaire    Fear of Current or Ex-Partner: No    Emotionally Abused: No    Physically Abused: No    Sexually Abused: No     Past Surgical History:  Procedure Laterality Date   COLONOSCOPY     COLONOSCOPY W/ POLYPECTOMY      Family History  Problem Relation Age of Onset   Stroke Mother    Colon cancer Sister    High blood pressure Sister    Colon cancer Brother    High blood pressure Brother    Stomach cancer Brother    Lymphoma Son    Colon cancer Other        1st degree relative <60   Diabetes Other        1st degree relative    Stroke Other    Esophageal cancer Neg Hx    Rectal cancer Neg Hx     Allergies  Allergen Reactions   Lisinopril     hyperkalemia   Reglan [Metoclopramide] Anxiety    Patient had bad reaction requiring ativan in addition to benadryl. He doesn't want to receive it any more    Current Outpatient Medications on File Prior to Visit  Medication Sig Dispense Refill   aspirin-acetaminophen-caffeine (EXCEDRIN MIGRAINE) 250-250-65 MG tablet Take 1 tablet by mouth every 6 (six) hours as needed for headache.     Blood Glucose Monitoring Suppl (ONE TOUCH ULTRA 2) w/Device KIT Use the device daily to check your blood sugars 1-4 times as instructed. 1 kit 0   Continuous Blood Gluc Sensor (FREESTYLE LIBRE 3 SENSOR) MISC Use to monitor glucose 2 each 6   diltiazem (CARDIZEM CD) 120 MG 24 hr capsule TAKE 1 CAPSULE BY MOUTH EVERY DAY 90 capsule 3   Dulaglutide (TRULICITY) 1.5 MG/0.5ML SOPN INJECT 1.5 MG INTO THE SKIN ONCE A WEEK. 2 mL 2   JARDIANCE 25 MG TABS tablet TAKE 1 TABLET (25 MG TOTAL) BY MOUTH DAILY. 90 tablet 1   Lancets (ONETOUCH ULTRASOFT) lancets Use to check blood sugar BID 100 each 12   metFORMIN (GLUCOPHAGE-XR) 500 MG 24 hr tablet TAKE 2 TABLETS BY MOUTH IN THE MORNING AND AT BEDTIME 30 tablet 0   Multiple Vitamins-Minerals (CENTRUM SILVER PO) Take 1 tablet by mouth daily.     ONETOUCH VERIO test strip USE TO TEST BLOOD SUGAR TWICE DAILY 100 strip 5   rosuvastatin (CRESTOR) 5 MG tablet TAKE 1 TABLET BY MOUTH EVERYDAY AT BEDTIME 90 tablet 3   Semaglutide,0.25 or  0.5MG /DOS, 2 MG/3ML SOPN Inject 0.5 mg into the skin once a week. 3 mL 0   vitamin B-12 (CYANOCOBALAMIN) 1000 MCG tablet Take 1 tablet (1,000 mcg total) by mouth daily. 30 tablet 0   No current facility-administered medications  on file prior to visit.    BP 120/72   Pulse 66   Temp 98.1 F (36.7 C) (Oral)   Ht 6\' 2"  (1.88 m)   Wt 247 lb (112 kg)   SpO2 97%   BMI 31.71 kg/m       Objective:   Physical Exam Vitals and nursing note reviewed.  Constitutional:      Appearance: Normal appearance. He is obese.  Cardiovascular:     Rate and Rhythm: Normal rate and regular rhythm.     Pulses: Normal pulses.     Heart sounds: Normal heart sounds.  Pulmonary:     Effort: Pulmonary effort is normal.     Breath sounds: Normal breath sounds.  Skin:    General: Skin is warm and dry.  Neurological:     General: No focal deficit present.     Mental Status: He is alert and oriented to person, place, and time.  Psychiatric:        Mood and Affect: Mood normal.        Behavior: Behavior normal.        Thought Content: Thought content normal.        Judgment: Judgment normal.       Assessment & Plan:  1. Type 2 diabetes mellitus with other specified complication, without long-term current use of insulin (HCC)  - POC HgB A1c- 7.4 - has improved  - Will see if he qualifies for PAP. Increase to 1 mg ozempic - Continue with metformin and Jardiance  - metFORMIN (GLUCOPHAGE-XR) 500 MG 24 hr tablet; Take 2 tablets (1,000 mg total) by mouth 2 (two) times daily with a meal.  Dispense: 360 tablet; Refill: 1  2. Essential hypertension - well controlled. No change in medication   3. Class 1 obesity due to excess calories with serious comorbidity and body mass index (BMI) of 30.0 to 30.9 in adult - Work on weight loss through diet and exercise   4. Diabetes mellitus treated with oral medication (HCC)  - metFORMIN (GLUCOPHAGE-XR) 500 MG 24 hr tablet; Take 2 tablets (1,000 mg total) by mouth 2  (two) times daily with a meal.  Dispense: 360 tablet; Refill: 1  5. Long-term current use of injectable noninsulin antidiabetic medication  - metFORMIN (GLUCOPHAGE-XR) 500 MG 24 hr tablet; Take 2 tablets (1,000 mg total) by mouth 2 (two) times daily with a meal.  Dispense: 360 tablet; Refill: 1  Shirline Frees, NP

## 2023-05-24 ENCOUNTER — Other Ambulatory Visit: Payer: Self-pay | Admitting: Adult Health

## 2023-07-11 ENCOUNTER — Other Ambulatory Visit: Payer: Self-pay | Admitting: Adult Health

## 2023-07-12 ENCOUNTER — Encounter (INDEPENDENT_AMBULATORY_CARE_PROVIDER_SITE_OTHER): Payer: Self-pay

## 2023-07-25 ENCOUNTER — Other Ambulatory Visit: Payer: Self-pay | Admitting: Adult Health

## 2023-07-25 NOTE — Telephone Encounter (Signed)
Refill sent.

## 2023-08-04 ENCOUNTER — Other Ambulatory Visit: Payer: Self-pay | Admitting: Adult Health

## 2023-08-04 ENCOUNTER — Encounter: Payer: Self-pay | Admitting: Adult Health

## 2023-08-04 ENCOUNTER — Ambulatory Visit: Payer: Medicare Other | Admitting: Adult Health

## 2023-08-04 VITALS — BP 110/60 | HR 75 | Temp 98.1°F | Ht 74.0 in | Wt 245.0 lb

## 2023-08-04 DIAGNOSIS — E1169 Type 2 diabetes mellitus with other specified complication: Secondary | ICD-10-CM

## 2023-08-04 DIAGNOSIS — E119 Type 2 diabetes mellitus without complications: Secondary | ICD-10-CM

## 2023-08-04 DIAGNOSIS — Z7984 Long term (current) use of oral hypoglycemic drugs: Secondary | ICD-10-CM | POA: Diagnosis not present

## 2023-08-04 DIAGNOSIS — Z7985 Long-term (current) use of injectable non-insulin antidiabetic drugs: Secondary | ICD-10-CM

## 2023-08-04 DIAGNOSIS — I10 Essential (primary) hypertension: Secondary | ICD-10-CM | POA: Diagnosis not present

## 2023-08-04 LAB — POCT GLYCOSYLATED HEMOGLOBIN (HGB A1C): Hemoglobin A1C: 7.8 % — AB (ref 4.0–5.6)

## 2023-08-04 MED ORDER — SEMAGLUTIDE(0.25 OR 0.5MG/DOS) 2 MG/3ML ~~LOC~~ SOPN
2.0000 mg | PEN_INJECTOR | SUBCUTANEOUS | 1 refills | Status: DC
Start: 2023-08-04 — End: 2023-08-08

## 2023-08-04 NOTE — Patient Instructions (Addendum)
It was great seeing you today   Your A1c increase to 7.8; I have increased your Ozempic to 2 mg weekly.   Follow up in 3 months for your physical exam

## 2023-08-04 NOTE — Progress Notes (Signed)
Subjective:    Patient ID: Jason [name], male    DOB: [birth year], 67 y.o.   MRN: [id number]  HPI  He presents to the office today for three month follow up regarding DM and HTN   Diabetes mellitus type II-managed with metformin 1000 mg twice daily, Jardiance 25 mg daily, and Ozempic 1 mg weekly  He does check his blood sugars periodically at home with readings in the 100-170 range.  He does report that he is trying to eat healthy and is doing some form of exercise  Lab Results  Component Value Date   HGBA1C 7.3 (A) 05/04/2023   Wt Readings from Last 3 Encounters:  08/04/23 245 lb (111.1 kg)  05/04/23 247 lb (112 kg)  01/31/23 242 lb (109.8 kg)   HTN -managed with Cardizem 120 mg daily.  He does not monitor his blood pressure at home.  Denies dizziness, lightheadedness, chest pain, or shortness of breath. He has been out of his BP medication as the pharmacy has been out of it for the last few days  BP Readings from Last 3 Encounters:  08/04/23 110/60  05/04/23 120/72  01/31/23 132/84    Review of Systems See HPI   Past Medical History:  Diagnosis Date   Arthritis    Bell palsy    Diabetes mellitus without complication (HCC)    HTN (hypertension)    Hyperlipidemia     Social History   Socioeconomic History   Marital status: Widowed    Spouse name: Not on file   Number of children: 2   Years of education: 12   Highest education level: Not on file  Occupational History   Occupation: transportation    Comment: Advertising account planner  Tobacco Use   Smoking status: Never   Smokeless tobacco: Never  Vaping Use   Vaping status: Never Used  Substance and Sexual Activity   Alcohol use: Yes    Alcohol/week: 6.0 standard drinks of alcohol    Types: 6 Cans of beer per week    Comment: on weekends   Drug use: Never   Sexual activity: Not on file  Other Topics Concern   Not on file  Social History Narrative      Drivers a taxi [place]    Two  children - both live locally.       Fun: fix cars, drag race    Social Determinants of Health   Financial Resource Strain: Low Risk  (10/24/2022)   Overall Financial Resource Strain (CARDIA)    Difficulty of Paying Living Expenses: Not very hard  Food Insecurity: No Food Insecurity (10/19/2022)   Hunger Vital Sign    Worried About Running Out of Food in the Last Year: Never true    Ran Out of Food in the Last Year: Never true  Transportation Needs: No Transportation Needs (10/19/2022)   PRAPARE - Administrator, Civil Service (Medical): No    Lack of Transportation (Non-Medical): No  Physical Activity: Insufficiently Active (09/09/2022)   Exercise Vital Sign    Days of Exercise per Week: 3 days    Minutes of Exercise per Session: 30 min  Stress: No Stress Concern Present (09/09/2022)   [employer] of Occupational Health - Occupational Stress Questionnaire    Feeling of Stress : Not at all  Social Connections: Moderately Integrated (09/09/2022)   Social Connection and Isolation Panel [NHANES]    Frequency of Communication with Friends and Family: More  than three times a week    Frequency of Social Gatherings with Friends and Family: More than three times a week    Attends Religious Services: More than 4 times per year    Active Member of Clubs or Organizations: Yes    Attends Banker Meetings: More than 4 times per year    Marital Status: Widowed  Intimate Partner Violence: Not At Risk (09/09/2022)   Humiliation, Afraid, Rape, and Kick questionnaire    Fear of Current or Ex-Partner: No    Emotionally Abused: No    Physically Abused: No    Sexually Abused: No    Past Surgical History:  Procedure Laterality Date   COLONOSCOPY     COLONOSCOPY W/ POLYPECTOMY      Family History  Problem Relation Age of Onset   Stroke Mother    Colon cancer Sister    High blood pressure Sister    Colon cancer Brother    High blood pressure Brother    Stomach  cancer Brother    Lymphoma Son    Colon cancer Other        1st degree relative <60   Diabetes Other        1st degree relative    Stroke Other    Esophageal cancer Neg Hx    Rectal cancer Neg Hx     Allergies  Allergen Reactions   Lisinopril     hyperkalemia   Reglan [Metoclopramide] Anxiety    Patient had bad reaction requiring ativan in addition to benadryl. He doesn't want to receive it any more    Current Outpatient Medications on File Prior to Visit  Medication Sig Dispense Refill   aspirin-acetaminophen-caffeine (EXCEDRIN MIGRAINE) 250-250-65 MG tablet Take 1 tablet by mouth every 6 (six) hours as needed for headache.     Blood Glucose Monitoring Suppl (ONE TOUCH ULTRA 2) w/Device KIT Use the device daily to check your blood sugars 1-4 times as instructed. 1 kit 0   Continuous Blood Gluc Sensor (FREESTYLE LIBRE 3 SENSOR) MISC Use to monitor glucose 2 each 6   diltiazem (CARDIZEM CD) 120 MG 24 hr capsule TAKE 1 CAPSULE BY MOUTH EVERY DAY 90 capsule 3   JARDIANCE 25 MG TABS tablet TAKE 1 TABLET (25 MG TOTAL) BY MOUTH DAILY. 90 tablet 1   Lancets (ONETOUCH ULTRASOFT) lancets Use to check blood sugar BID 100 each 12   Multiple Vitamins-Minerals (CENTRUM SILVER PO) Take 1 tablet by mouth daily.     ONETOUCH VERIO test strip USE TO TEST BLOOD SUGAR TWICE DAILY 100 strip 5   rosuvastatin (CRESTOR) 5 MG tablet TAKE 1 TABLET BY MOUTH EVERYDAY AT BEDTIME 90 tablet 3   Semaglutide, 1 MG/DOSE, 4 MG/3ML SOPN Inject 1 mg as directed once a week. 9 mL 0   vitamin B-12 (CYANOCOBALAMIN) 1000 MCG tablet Take 1 tablet (1,000 mcg total) by mouth daily. 30 tablet 0   metFORMIN (GLUCOPHAGE-XR) 500 MG 24 hr tablet Take 2 tablets (1,000 mg total) by mouth 2 (two) times daily with a meal. 360 tablet 1   No current facility-administered medications on file prior to visit.    BP 110/60   Pulse 75   Temp 98.1 F (36.7 C) (Oral)   Ht 6\' 2"  (1.88 m)   Wt 245 lb (111.1 kg)   SpO2 95%   BMI 31.46  kg/m       Objective:   Physical Exam Vitals and nursing note reviewed.  Constitutional:  Appearance: Normal appearance. He is obese.  Cardiovascular:     Rate and Rhythm: Normal rate and regular rhythm.     Pulses: Normal pulses.     Heart sounds: Normal heart sounds.  Pulmonary:     Effort: Pulmonary effort is normal.     Breath sounds: Normal breath sounds.  Skin:    General: Skin is warm and dry.  Neurological:     General: No focal deficit present.     Mental Status: He is alert and oriented to person, place, and time.  Psychiatric:        Mood and Affect: Mood normal.        Behavior: Behavior normal.        Thought Content: Thought content normal.        Judgment: Judgment normal.        Assessment & Plan:  1. Type 2 diabetes mellitus with other specified complication, without long-term current use of insulin (HCC)  - POC HgB A1c- 7.8 - has increased.  - We will increase Ozempic to 2 mg weekly.  - needs to exercise more and continue to eat healthy  - Follow up in 3 months for CPE  - Semaglutide,0.25 or 0.5MG /DOS, 2 MG/3ML SOPN; Inject 2 mg into the skin once a week.  Dispense: 9 mL; Refill: 1  2. Essential hypertension - Well controlled. No change in medication   3. Diabetes mellitus treated with oral medication (HCC)  - POC HgB A1c - Semaglutide,0.25 or 0.5MG /DOS, 2 MG/3ML SOPN; Inject 2 mg into the skin once a week.  Dispense: 9 mL; Refill: 1  4. Long-term current use of injectable noninsulin antidiabetic medication  - POC HgB A1c - Semaglutide,0.25 or 0.5MG /DOS, 2 MG/3ML SOPN; Inject 2 mg into the skin once a week.  Dispense: 9 mL; Refill: 1   Shirline Frees, NP

## 2023-08-08 ENCOUNTER — Encounter (HOSPITAL_COMMUNITY): Payer: Self-pay

## 2023-08-08 ENCOUNTER — Other Ambulatory Visit: Payer: Self-pay

## 2023-08-08 ENCOUNTER — Emergency Department (HOSPITAL_COMMUNITY)
Admission: EM | Admit: 2023-08-08 | Discharge: 2023-08-08 | Disposition: A | Discharge status: Home / Self Care | Payer: Medicare Other | Attending: Emergency Medicine | Admitting: Emergency Medicine

## 2023-08-08 ENCOUNTER — Other Ambulatory Visit: Payer: Self-pay | Admitting: Adult Health

## 2023-08-08 DIAGNOSIS — R531 Weakness: Secondary | ICD-10-CM | POA: Diagnosis not present

## 2023-08-08 DIAGNOSIS — Z7984 Long term (current) use of oral hypoglycemic drugs: Secondary | ICD-10-CM | POA: Diagnosis not present

## 2023-08-08 DIAGNOSIS — E1169 Type 2 diabetes mellitus with other specified complication: Secondary | ICD-10-CM

## 2023-08-08 DIAGNOSIS — E119 Type 2 diabetes mellitus without complications: Secondary | ICD-10-CM | POA: Diagnosis not present

## 2023-08-08 DIAGNOSIS — R112 Nausea with vomiting, unspecified: Secondary | ICD-10-CM | POA: Insufficient documentation

## 2023-08-08 DIAGNOSIS — Z7982 Long term (current) use of aspirin: Secondary | ICD-10-CM | POA: Insufficient documentation

## 2023-08-08 DIAGNOSIS — Z79899 Other long term (current) drug therapy: Secondary | ICD-10-CM | POA: Insufficient documentation

## 2023-08-08 DIAGNOSIS — I10 Essential (primary) hypertension: Secondary | ICD-10-CM | POA: Diagnosis not present

## 2023-08-08 DIAGNOSIS — Z743 Need for continuous supervision: Secondary | ICD-10-CM | POA: Diagnosis not present

## 2023-08-08 DIAGNOSIS — R61 Generalized hyperhidrosis: Secondary | ICD-10-CM | POA: Diagnosis not present

## 2023-08-08 DIAGNOSIS — Z7985 Long-term (current) use of injectable non-insulin antidiabetic drugs: Secondary | ICD-10-CM

## 2023-08-08 LAB — CBC WITH DIFFERENTIAL/PLATELET
Abs Immature Granulocytes: 0.04 10*3/uL (ref 0.00–0.07)
Basophils Absolute: 0.1 10*3/uL (ref 0.0–0.1)
Basophils Relative: 1 %
Eosinophils Absolute: 0.3 10*3/uL (ref 0.0–0.5)
Eosinophils Relative: 3 %
HCT: 48.7 % (ref 39.0–52.0)
Hemoglobin: 15.8 g/dL (ref 13.0–17.0)
Immature Granulocytes: 0 %
Lymphocytes Relative: 29 %
Lymphs Abs: 2.9 10*3/uL (ref 0.7–4.0)
MCH: 31.8 pg (ref 26.0–34.0)
MCHC: 32.4 g/dL (ref 30.0–36.0)
MCV: 98 fL (ref 80.0–100.0)
Monocytes Absolute: 0.5 10*3/uL (ref 0.1–1.0)
Monocytes Relative: 5 %
Neutro Abs: 6.1 10*3/uL (ref 1.7–7.7)
Neutrophils Relative %: 62 %
Platelets: 182 10*3/uL (ref 150–400)
RBC: 4.97 MIL/uL (ref 4.22–5.81)
RDW: 13.2 % (ref 11.5–15.5)
WBC: 9.9 10*3/uL (ref 4.0–10.5)
nRBC: 0 % (ref 0.0–0.2)

## 2023-08-08 LAB — COMPREHENSIVE METABOLIC PANEL
ALT: 40 U/L (ref 0–44)
AST: 28 U/L (ref 15–41)
Albumin: 3.9 g/dL (ref 3.5–5.0)
Alkaline Phosphatase: 66 U/L (ref 38–126)
Anion gap: 12 (ref 5–15)
BUN: 17 mg/dL (ref 8–23)
CO2: 24 mmol/L (ref 22–32)
Calcium: 9.7 mg/dL (ref 8.9–10.3)
Chloride: 102 mmol/L (ref 98–111)
Creatinine, Ser: 1.32 mg/dL — ABNORMAL HIGH (ref 0.61–1.24)
GFR, Estimated: 58 mL/min — ABNORMAL LOW (ref >60)
Glucose, Bld: 226 mg/dL — ABNORMAL HIGH (ref 70–99)
Potassium: 4.5 mmol/L (ref 3.5–5.1)
Sodium: 138 mmol/L (ref 135–145)
Total Bilirubin: 0.8 mg/dL (ref 0.3–1.2)
Total Protein: 7.5 g/dL (ref 6.5–8.1)

## 2023-08-08 LAB — LIPASE, BLOOD: Lipase: 61 U/L — ABNORMAL HIGH (ref 11–51)

## 2023-08-08 LAB — TROPONIN I (HIGH SENSITIVITY): Troponin I (High Sensitivity): 7 ng/L (ref <18)

## 2023-08-08 MED ORDER — ONDANSETRON HCL 4 MG PO TABS: 4.0000 mg | ORAL_TABLET | Freq: Three times a day (TID) | ORAL | 0 refills | Status: DC | PRN

## 2023-08-08 MED ORDER — SODIUM CHLORIDE 0.9 % IV SOLN
12.5000 mg | Freq: Once | INTRAVENOUS | Status: DC
  Filled 2023-08-08: qty 0.5

## 2023-08-08 NOTE — Discharge Instructions (Addendum)
You were evaluated today for nausea and vomiting.  I have prescribed Zofran to be taken as directed for nausea.  Please follow-up with me with your primary care provider.  You develop intractable nausea and vomiting or other life-threatening symptoms please return to the emergency department.

## 2023-08-08 NOTE — ED Provider Notes (Signed)
[place] EMERGENCY DEPARTMENT AT [facility] Provider Note   CSN: [id number] Arrival date & time: 08/08/23  2952     History  Chief Complaint  Patient presents with   Emesis    Jason [name] is a 67 y.o. male.  Patient with past medical history significant for type II DM, hypertension, hyperlipidemia presents to the emergency department via EMS complaining of nausea and vomiting.  Patient states his symptoms began at approximately 4:30 AM.  He woke up prior to onset of symptoms and was feeling well.  He states that after EMS gave him Zofran he feels much better.  He denies abdominal pain chest pain, shortness of breath, diaphoresis, diarrhea, constipation, urinary symptoms, headache, fever.  The patient denies any recent medication changes   Emesis      Home Medications Prior to Admission medications   Medication Sig Start Date End Date Taking? Authorizing Provider  ondansetron (ZOFRAN) 4 MG tablet Take 1 tablet (4 mg total) by mouth every 8 (eight) hours as needed for nausea or vomiting. 08/08/23  Yes Darrick Grinder, PA-C  aspirin-acetaminophen-caffeine (EXCEDRIN MIGRAINE) [phone] MG tablet Take 1 tablet by mouth every 6 (six) hours as needed for headache.    [provider]  Blood Glucose Monitoring Suppl (ONE TOUCH ULTRA 2) w/Device KIT Use the device daily to check your blood sugars 1-4 times as instructed. 05/19/20   Nafziger, Kandee Keen, NP  Continuous Blood Gluc Sensor (FREESTYLE LIBRE 3 SENSOR) MISC Use to monitor glucose 10/28/22   Nafziger, Kandee Keen, NP  diltiazem (CARDIZEM CD) 120 MG 24 hr capsule TAKE 1 CAPSULE BY MOUTH EVERY DAY 07/21/22   Nafziger, Kandee Keen, NP  JARDIANCE 25 MG TABS tablet TAKE 1 TABLET (25 MG TOTAL) BY MOUTH DAILY. 04/12/23   Nafziger, Kandee Keen, NP  Lancets [facility] ULTRASOFT) lancets Use to check blood sugar BID 06/17/21   Nafziger, Kandee Keen, NP  metFORMIN (GLUCOPHAGE-XR) 500 MG 24 hr tablet Take 2 tablets (1,000 mg total) by mouth 2 (two) times  daily with a meal. 05/04/23 08/02/23  Nafziger, Kandee Keen, NP  Multiple Vitamins-Minerals (CENTRUM SILVER PO) Take 1 tablet by mouth daily.    [provider]  [facility] VERIO test strip USE TO TEST BLOOD SUGAR TWICE DAILY 07/07/22   Nafziger, Kandee Keen, NP  rosuvastatin (CRESTOR) 5 MG tablet TAKE 1 TABLET BY MOUTH EVERYDAY AT BEDTIME 07/11/22   Nafziger, Kandee Keen, NP  Semaglutide,0.25 or 0.5MG /DOS, 2 MG/3ML SOPN Inject 2 mg into the skin once a week. 08/04/23 11/02/23  Nafziger, Kandee Keen, NP  vitamin B-12 (CYANOCOBALAMIN) 1000 MCG tablet Take 1 tablet (1,000 mcg total) by mouth daily. 07/16/21   Leroy Sea, MD      Allergies    Lisinopril and Reglan [metoclopramide]    Review of Systems   Review of Systems  Gastrointestinal:  Positive for vomiting.    Physical Exam Updated Vital Signs BP (!) 149/83 (BP Location: Right Arm)   Pulse 67   Temp (!) 97.5 F (36.4 C) (Oral)   Resp 20   Ht 6\' 3"  (1.905 m)   Wt (!) 154.2 kg   SpO2 100%   BMI 42.50 kg/m  Physical Exam Vitals and nursing note reviewed.  Constitutional:      General: He is not in acute distress.    Appearance: He is well-developed.  HENT:     Head: Normocephalic and atraumatic.     Mouth/Throat:     Mouth: Mucous membranes are moist.  Eyes:     Conjunctiva/sclera:  Conjunctivae normal.  Cardiovascular:     Rate and Rhythm: Normal rate.  Pulmonary:     Effort: Pulmonary effort is normal. No respiratory distress.     Breath sounds: Normal breath sounds.  Abdominal:     Palpations: Abdomen is soft.     Tenderness: There is no abdominal tenderness.  Musculoskeletal:        General: No swelling.     Cervical back: Neck supple.  Skin:    General: Skin is warm and dry.     Capillary Refill: Capillary refill takes less than 2 seconds.  Neurological:     Mental Status: He is alert.  Psychiatric:        Mood and Affect: Mood normal.     ED Results / Procedures / Treatments   Labs (all labs ordered are listed, but only  abnormal results are displayed) Labs Reviewed  COMPREHENSIVE METABOLIC PANEL - Abnormal; Notable for the following components:      Result Value   Glucose, Bld 226 (*)    Creatinine, Ser 1.32 (*)    GFR, Estimated 58 (*)    All other components within normal limits  LIPASE, BLOOD - Abnormal; Notable for the following components:   Lipase 61 (*)    All other components within normal limits  CBC WITH DIFFERENTIAL/PLATELET  TROPONIN I (HIGH SENSITIVITY)    EKG None  Radiology No results found.  Procedures Procedures    Medications Ordered in ED Medications - No data to display  ED Course/ Medical Decision Making/ A&P                                 Medical Decision Making Amount and/or Complexity of Data Reviewed Labs: ordered.   This patient presents to the ED for concern of nausea and vomiting, this involves an extensive number of treatment options, and is a complaint that carries with it a high risk of complications and morbidity.  The differential diagnosis includes gastroenteritis, appendicitis, cholecystitis, pancreatitis, ACS, others   Co morbidities that complicate the patient evaluation  Type II DM   Additional history obtained:  Additional history obtained from EMS External records from outside source obtained and reviewed including primary care notes   Lab Tests:  I Ordered, and personally interpreted labs.  The pertinent results include: Lipase 61, labs otherwise unremarkable   Imaging Studies ordered:  The patient has no difficulty breathing, no abdominal tenderness on exam.  I see no indication at this time for imaging   Cardiac Monitoring: / EKG:  The patient was maintained on a cardiac monitor.  I personally viewed and interpreted the cardiac monitored which showed an underlying rhythm of: sinus rhythm    Test / Admission - Considered:  Patient with nausea and vomiting but no signs of acute abdomen, no significant laboratory  abnormalities.  No chest pain, negative troponin, nonischemic EKG.  Patient denies any urinary symptoms.  Patient able to tolerate oral intake will discharge home with prescription for Zofran.         Final Clinical Impression(s) / ED Diagnoses Final diagnoses:  Nausea and vomiting, unspecified vomiting type    Rx / DC Orders ED Discharge Orders          Ordered    ondansetron (ZOFRAN) 4 MG tablet  Every 8 hours PRN        08/08/23 4098  Pamala Duffel 08/08/23 5621    Zadie Rhine, MD 08/08/23 [phone]

## 2023-08-08 NOTE — ED Triage Notes (Signed)
Pt BIBEMS from home w/ c/o N/V. As per pt sx started prior to arrival Denies any chest pain or abd pain

## 2023-08-09 ENCOUNTER — Other Ambulatory Visit: Payer: Self-pay | Admitting: Adult Health

## 2023-08-09 DIAGNOSIS — E782 Mixed hyperlipidemia: Secondary | ICD-10-CM

## 2023-08-11 ENCOUNTER — Other Ambulatory Visit: Payer: Self-pay | Admitting: Adult Health

## 2023-08-11 MED ORDER — SEMAGLUTIDE (2 MG/DOSE) 8 MG/3ML ~~LOC~~ SOPN
2.0000 mg | PEN_INJECTOR | SUBCUTANEOUS | 2 refills | Status: DC
Start: 2023-08-11 — End: 2023-11-03

## 2023-08-21 ENCOUNTER — Telehealth: Payer: Self-pay

## 2023-08-21 NOTE — Telephone Encounter (Signed)
Transition Care Management Unsuccessful Follow-up Telephone Call  Date of discharge and from where:  [facility] 8/27  Attempts:  1st Attempt  Reason for unsuccessful TCM follow-up call:  No answer/busy   Lenard Forth [place]  [facility], [facility] Guide, Phone: [phone] Website: [email or web address]

## 2023-08-24 ENCOUNTER — Telehealth: Payer: Self-pay

## 2023-08-24 NOTE — Telephone Encounter (Signed)
Transition Care Management Unsuccessful Follow-up Telephone Call  Date of discharge and from where:  [facility] 8/27  Attempts:  2nd Attempt  Reason for unsuccessful TCM follow-up call:  No answer/busy   Lenard Forth [place]  [facility], [facility] Guide, Phone: [phone] Website: [email or web address]

## 2023-09-09 ENCOUNTER — Other Ambulatory Visit: Payer: Self-pay | Admitting: Adult Health

## 2023-09-09 DIAGNOSIS — I10 Essential (primary) hypertension: Secondary | ICD-10-CM

## 2023-09-10 ENCOUNTER — Other Ambulatory Visit: Payer: Self-pay | Admitting: Adult Health

## 2023-09-10 DIAGNOSIS — E1169 Type 2 diabetes mellitus with other specified complication: Secondary | ICD-10-CM

## 2023-09-10 DIAGNOSIS — Z7985 Long-term (current) use of injectable non-insulin antidiabetic drugs: Secondary | ICD-10-CM

## 2023-09-10 DIAGNOSIS — E119 Type 2 diabetes mellitus without complications: Secondary | ICD-10-CM

## 2023-09-13 ENCOUNTER — Ambulatory Visit: Payer: Medicare Other

## 2023-09-13 VITALS — Ht 74.0 in | Wt 240.0 lb

## 2023-09-13 DIAGNOSIS — Z23 Encounter for immunization: Secondary | ICD-10-CM | POA: Diagnosis not present

## 2023-09-13 DIAGNOSIS — Z0000 Encounter for general adult medical examination without abnormal findings: Secondary | ICD-10-CM

## 2023-09-13 NOTE — Progress Notes (Signed)
Subjective:   Jason [name] is a 67 y.o. male who presents for Medicare Annual/Subsequent preventive examination.  Visit Complete: Virtual  I connected with  Jason [name] on 09/13/23 by a audio enabled telemedicine application and verified that I am speaking with the correct person using two identifiers.  Patient Location: Home  Provider Location: Home Office  I discussed the limitations of evaluation and management by telemedicine. The patient expressed understanding and agreed to proceed.   Because this visit was a virtual/telehealth visit, some criteria may be missing or patient reported. Any vitals not documented were not able to be obtained and vitals that have been documented are patient reported.    Cardiac Risk Factors include: advanced age (>73men, >17 women);male gender;diabetes mellitus;hypertension     Objective:    Today's Vitals   09/13/23 1406  Weight: 240 lb (108.9 kg)  Height: 6\' 2"  (1.88 m)   Body mass index is 30.81 kg/m.     09/13/2023    2:14 PM 08/08/2023    5:36 AM 09/09/2022    2:18 PM 11/25/2021    2:09 PM 10/15/2021    8:15 PM 09/08/2021    3:22 PM 07/26/2021    4:00 AM  Advanced Directives  Does Patient Have a Medical Advance Directive? No No No No No No No  Would patient like information on creating a medical advance directive? No - Patient declined  No - Patient declined No - Patient declined No - Patient declined No - Patient declined No - Guardian declined    Current Medications (verified) Outpatient Encounter Medications as of 09/13/2023  Medication Sig   Semaglutide, 2 MG/DOSE, 8 MG/3ML SOPN Inject 2 mg as directed once a week.   aspirin-acetaminophen-caffeine (EXCEDRIN MIGRAINE) 250-250-65 MG tablet Take 1 tablet by mouth every 6 (six) hours as needed for headache.   Blood Glucose Monitoring Suppl (ONE TOUCH ULTRA 2) w/Device KIT Use the device daily to check your blood sugars 1-4 times as instructed.   Continuous Blood Gluc  Sensor (FREESTYLE LIBRE 3 SENSOR) MISC Use to monitor glucose   diltiazem (CARDIZEM CD) 120 MG 24 hr capsule TAKE 1 CAPSULE BY MOUTH EVERY DAY   JARDIANCE 25 MG TABS tablet TAKE 1 TABLET (25 MG TOTAL) BY MOUTH DAILY.   Lancets (ONETOUCH ULTRASOFT) lancets Use to check blood sugar BID   metFORMIN (GLUCOPHAGE-XR) 500 MG 24 hr tablet TAKE 2 TABLETS (1,000 MG TOTAL) BY MOUTH 2 (TWO) TIMES DAILY WITH A MEAL.   Multiple Vitamins-Minerals (CENTRUM SILVER PO) Take 1 tablet by mouth daily.   ondansetron (ZOFRAN) 4 MG tablet Take 1 tablet (4 mg total) by mouth every 8 (eight) hours as needed for nausea or vomiting.   ONETOUCH VERIO test strip USE TO TEST BLOOD SUGAR TWICE DAILY   rosuvastatin (CRESTOR) 5 MG tablet TAKE 1 TABLET BY MOUTH EVERYDAY AT BEDTIME   vitamin B-12 (CYANOCOBALAMIN) 1000 MCG tablet Take 1 tablet (1,000 mcg total) by mouth daily.   No facility-administered encounter medications on file as of 09/13/2023.    Allergies (verified) Lisinopril and Reglan [metoclopramide]   History: Past Medical History:  Diagnosis Date   Arthritis    Bell palsy    Diabetes mellitus without complication (HCC)    HTN (hypertension)    Hyperlipidemia    Past Surgical History:  Procedure Laterality Date   COLONOSCOPY     COLONOSCOPY W/ POLYPECTOMY     Family History  Problem Relation Age of Onset   Stroke Mother  Colon cancer Sister    High blood pressure Sister    Colon cancer Brother    High blood pressure Brother    Stomach cancer Brother    Lymphoma Son    Colon cancer Other        1st degree relative <60   Diabetes Other        1st degree relative    Stroke Other    Esophageal cancer Neg Hx    Rectal cancer Neg Hx    Social History   Socioeconomic History   Marital status: Widowed    Spouse name: Not on file   Number of children: 2   Years of education: 12   Highest education level: Not on file  Occupational History   Occupation: transportation    Comment: Buyer, retail  Tobacco Use   Smoking status: Never   Smokeless tobacco: Never  Vaping Use   Vaping status: Never Used  Substance and Sexual Activity   Alcohol use: Yes    Alcohol/week: 6.0 standard drinks of alcohol    Types: 6 Cans of beer per week    Comment: on weekends   Drug use: Never   Sexual activity: Not on file  Other Topics Concern   Not on file  Social History Narrative      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       Fun: fix cars, drag race    Social Determinants of Health   Financial Resource Strain: Low Risk  (09/13/2023)   Overall Financial Resource Strain (CARDIA)    Difficulty of Paying Living Expenses: Not hard at all  Food Insecurity: No Food Insecurity (09/13/2023)   Hunger Vital Sign    Worried About Running Out of Food in the Last Year: Never true    Ran Out of Food in the Last Year: Never true  Transportation Needs: No Transportation Needs (09/13/2023)   PRAPARE - Administrator, Civil Service (Medical): No    Lack of Transportation (Non-Medical): No  Physical Activity: Insufficiently Active (09/13/2023)   Exercise Vital Sign    Days of Exercise per Week: 3 days    Minutes of Exercise per Session: 20 min  Stress: No Stress Concern Present (09/13/2023)   [employer] of Occupational Health - Occupational Stress Questionnaire    Feeling of Stress : Not at all  Social Connections: Moderately Integrated (09/13/2023)   Social Connection and Isolation Panel [NHANES]    Frequency of Communication with Friends and Family: More than three times a week    Frequency of Social Gatherings with Friends and Family: More than three times a week    Attends Religious Services: More than 4 times per year    Active Member of [employer] or Organizations: Yes    Attends Banker Meetings: More than 4 times per year    Marital Status: Widowed    Tobacco Counseling Counseling given: Not Answered   Clinical  Intake:  Pre-visit preparation completed: Yes  Pain : No/denies pain     BMI - recorded: 30.81 Nutritional Status: BMI > 30  Obese Nutritional Risks: None Diabetes: Yes CBG done?: Yes (Cbg 130 Per patient) CBG resulted in Enter/ Edit results?: Yes Did pt. bring in CBG monitor from home?: No  How often do you need to have someone help you when you read instructions, pamphlets, or other written materials from your doctor or pharmacy?: 1 - Never  Interpreter Needed?: No  Information entered by :: Theresa Mulligan LPN   Activities of Daily Living    09/13/2023    2:14 PM  In your present state of health, do you have any difficulty performing the following activities:  Hearing? 0  Vision? 0  Difficulty concentrating or making decisions? 0  Walking or climbing stairs? 0  Dressing or bathing? 0  Doing errands, shopping? 0  Preparing Food and eating ? N  Using the Toilet? N  In the past six months, have you accidently leaked urine? N  Do you have problems with loss of bowel control? N  Managing your Medications? N  Managing your Finances? N  Housekeeping or managing your Housekeeping? N    Patient Care Team: Shirline Frees, NP as PCP - General (Family Medicine) Verner Chol, [facility] (Inactive) as Pharmacist (Pharmacist)  Indicate any recent Medical Services you may have received from other than [facility] providers in the past year (date may be approximate).     Assessment:   This is a routine wellness examination for [facility].  Hearing/Vision screen Hearing Screening - Comments:: Denies hearing difficulties   Vision Screening - Comments:: Wears rx glasses - up to date with routine eye exams with  [facility]   Goals Addressed               This Visit's Progress     Increase physical activity (pt-stated)        Lose weight       Depression Screen    09/13/2023    2:13 PM 10/28/2022    8:11 AM 10/19/2022    2:01 PM 09/09/2022    2:14 PM 07/29/2022    8:06 AM  11/25/2021    2:09 PM 09/08/2021    3:25 PM  PHQ 2/9 Scores  PHQ - 2 Score 0 0 0 0 0 0 0  PHQ- 9 Score  0         Fall Risk    09/13/2023    2:14 PM 10/28/2022    8:11 AM 09/09/2022    2:16 PM 07/29/2022    8:06 AM 11/25/2021    2:09 PM  Fall Risk   Falls in the past year? 0 0 0 0 0  Number falls in past yr: 0 0 0 0   Injury with Fall? 0 0 0 0   Risk for fall due to : No Fall Risks No Fall Risks No Fall Risks No Fall Risks   Follow up Falls prevention discussed Falls evaluation completed Falls prevention discussed Falls evaluation completed     MEDICARE RISK AT HOME: Medicare Risk at Home Any stairs in or around the home?: Yes If so, are there any without handrails?: No Home free of loose throw rugs in walkways, pet beds, electrical cords, etc?: Yes Adequate lighting in your home to reduce risk of falls?: Yes Life alert?: No Use of a cane, walker or w/c?: No Grab bars in the bathroom?: No Shower chair or bench in shower?: No Elevated toilet seat or a handicapped toilet?: No  TIMED UP AND GO:  Was the test performed?  No    Cognitive Function:        09/13/2023    2:15 PM 09/09/2022    2:18 PM  6CIT Screen  What Year? 0 points 0 points  What month? 0 points 0 points  What time? 0 points 0 points  Count back from 20 0 points 0 points  Months in  reverse 0 points 0 points  Repeat phrase 0 points 0 points  Total Score 0 points 0 points    Immunizations Immunization History  Administered Date(s) Administered   Fluad Quad(high Dose 65+) 09/11/2019, 09/03/2020, 10/28/2022   Influenza Split 09/12/2012   Influenza Whole 12/24/2007   Influenza,inj,Quad PF,6+ Mos 02/02/2016, 10/10/2018   PFIZER(Purple Top)SARS-COV-2 Vaccination 02/24/2020, 03/16/2020, 02/25/2021   PNEUMOCOCCAL CONJUGATE-20 07/29/2022   Pneumococcal Conjugate-13 09/11/2019   Pneumococcal Polysaccharide-23 02/25/2016   Td 01/31/2001   Tdap 02/02/2016   Zoster Recombinant(Shingrix) 02/25/2021     TDAP status: Up to date  Flu Vaccine status: Due, Education has been provided regarding the importance of this vaccine. Advised may receive this vaccine at local pharmacy or Health Dept. Aware to provide a copy of the vaccination record if obtained from local pharmacy or Health Dept. Verbalized acceptance and understanding.  Pneumococcal vaccine status: Up to date  Covid-19 vaccine status: Declined, Education has been provided regarding the importance of this vaccine but patient still declined. Advised may receive this vaccine at local pharmacy or Health Dept.or vaccine clinic. Aware to provide a copy of the vaccination record if obtained from local pharmacy or Health Dept. Verbalized acceptance and understanding.  Qualifies for Shingles Vaccine? Yes   Zostavax completed No Shingrix completed: No  Screening Tests Health Maintenance  Topic Date Due   Zoster Vaccines- Shingrix (2 of 2) 04/22/2021   INFLUENZA VACCINE  07/13/2023   Diabetic kidney evaluation - Urine ACR  07/30/2023   COVID-19 Vaccine (4 - 2023-24 season) 08/13/2023   OPHTHALMOLOGY EXAM  12/29/2023   HEMOGLOBIN A1C  02/04/2024   FOOT EXAM  05/03/2024   Diabetic kidney evaluation - eGFR measurement  08/07/2024   Medicare Annual Wellness (AWV)  09/12/2024   Colonoscopy  11/10/2025   DTaP/Tdap/Td (3 - Td or Tdap) 02/01/2026   Pneumonia Vaccine 38+ Years old  Completed   Hepatitis C Screening  Completed   HPV VACCINES  Aged Out    Health Maintenance  Health Maintenance Due  Topic Date Due   Zoster Vaccines- Shingrix (2 of 2) 04/22/2021   INFLUENZA VACCINE  07/13/2023   Diabetic kidney evaluation - Urine ACR  07/30/2023   COVID-19 Vaccine (4 - 2023-24 season) 08/13/2023    Colorectal cancer screening: Type of screening: Colonoscopy. Completed 11/10/22. Repeat every 3 years    Additional Screening:  Hepatitis C Screening: does qualify; Completed 02/02/16  Vision Screening: Recommended annual ophthalmology  exams for early detection of glaucoma and other disorders of the eye. Is the patient up to date with their annual eye exam?  Yes  Who is the provider or what is the name of the office in which the patient attends annual eye exams? [facility] If pt is not established with a provider, would they like to be referred to a provider to establish care? No .   Dental Screening: Recommended annual dental exams for proper oral hygiene  Diabetic Foot Exam: Diabetic Foot Exam: Completed 05/04/23  Community Resource Referral / Chronic Care Management:  CRR required this visit?  No   CCM required this visit?  No     Plan:     I have personally reviewed and noted the following in the patient's chart:   Medical and social history Use of alcohol, tobacco or illicit drugs  Current medications and supplements including opioid prescriptions. Patient is not currently taking opioid prescriptions. Functional ability and status Nutritional status Physical activity Advanced directives List of other physicians Hospitalizations, surgeries,  and ER visits in previous 12 months Vitals Screenings to include cognitive, depression, and falls Referrals and appointments  In addition, I have reviewed and discussed with patient certain preventive protocols, quality metrics, and best practice recommendations. A written personalized care plan for preventive services as well as general preventive health recommendations were provided to patient.     Tillie Rung, LPN   [id number]   After Visit Summary: (MyChart) Due to this being a telephonic visit, the after visit summary with patients personalized plan was offered to patient via MyChart   Nurse Notes: None

## 2023-09-13 NOTE — Patient Instructions (Addendum)
Jason [name] , Thank you for taking time to come for your Medicare Wellness Visit. I appreciate your ongoing commitment to your health goals. Please review the following plan we discussed and let me know if I can assist you in the future.   Referrals/Orders/Follow-Ups/Clinician Recommendations:   This is a list of the screening recommended for you and due dates:  Health Maintenance  Topic Date Due   Zoster (Shingles) Vaccine (2 of 2) 04/22/2021   Flu Shot  07/13/2023   Yearly kidney health urinalysis for diabetes  07/30/2023   COVID-19 Vaccine (4 - 2023-24 season) 08/13/2023   Eye exam for diabetics  12/29/2023   Hemoglobin A1C  02/04/2024   Complete foot exam   05/03/2024   Yearly kidney function blood test for diabetes  08/07/2024   Medicare Annual Wellness Visit  09/12/2024   Colon Cancer Screening  11/10/2025   DTaP/Tdap/Td vaccine (3 - Td or Tdap) 02/01/2026   Pneumonia Vaccine  Completed   Hepatitis C Screening  Completed   HPV Vaccine  Aged Out    Advanced directives: (Declined) Advance directive discussed with you today. Even though you declined this today, please call our office should you change your mind, and we can give you the proper paperwork for you to fill out.  Next Medicare Annual Wellness Visit scheduled for next year: Yes

## 2023-11-03 ENCOUNTER — Other Ambulatory Visit: Payer: Self-pay | Admitting: Adult Health

## 2023-11-03 ENCOUNTER — Ambulatory Visit (INDEPENDENT_AMBULATORY_CARE_PROVIDER_SITE_OTHER): Payer: Medicare Other | Admitting: Adult Health

## 2023-11-03 ENCOUNTER — Encounter: Payer: Self-pay | Admitting: Adult Health

## 2023-11-03 VITALS — BP 150/90 | HR 94 | Temp 98.2°F | Ht 74.0 in | Wt 237.0 lb

## 2023-11-03 DIAGNOSIS — I10 Essential (primary) hypertension: Secondary | ICD-10-CM

## 2023-11-03 DIAGNOSIS — Z7984 Long term (current) use of oral hypoglycemic drugs: Secondary | ICD-10-CM

## 2023-11-03 DIAGNOSIS — E119 Type 2 diabetes mellitus without complications: Secondary | ICD-10-CM

## 2023-11-03 DIAGNOSIS — Z7985 Long-term (current) use of injectable non-insulin antidiabetic drugs: Secondary | ICD-10-CM | POA: Diagnosis not present

## 2023-11-03 DIAGNOSIS — E782 Mixed hyperlipidemia: Secondary | ICD-10-CM

## 2023-11-03 DIAGNOSIS — Z23 Encounter for immunization: Secondary | ICD-10-CM | POA: Diagnosis not present

## 2023-11-03 DIAGNOSIS — Z125 Encounter for screening for malignant neoplasm of prostate: Secondary | ICD-10-CM

## 2023-11-03 DIAGNOSIS — Z0000 Encounter for general adult medical examination without abnormal findings: Secondary | ICD-10-CM | POA: Diagnosis not present

## 2023-11-03 LAB — COMPREHENSIVE METABOLIC PANEL
ALT: 31 U/L (ref 0–53)
AST: 22 U/L (ref 0–37)
Albumin: 4.4 g/dL (ref 3.5–5.2)
Alkaline Phosphatase: 85 U/L (ref 39–117)
BUN: 21 mg/dL (ref 6–23)
CO2: 28 meq/L (ref 19–32)
Calcium: 10.4 mg/dL (ref 8.4–10.5)
Chloride: 104 meq/L (ref 96–112)
Creatinine, Ser: 1.25 mg/dL (ref 0.40–1.50)
GFR: 58.74 mL/min — ABNORMAL LOW (ref >60.00)
Glucose, Bld: 152 mg/dL — ABNORMAL HIGH (ref 70–99)
Potassium: 5.3 meq/L — ABNORMAL HIGH (ref 3.5–5.1)
Sodium: 140 meq/L (ref 135–145)
Total Bilirubin: 0.6 mg/dL (ref 0.2–1.2)
Total Protein: 7.8 g/dL (ref 6.0–8.3)

## 2023-11-03 LAB — MICROALBUMIN / CREATININE URINE RATIO
Creatinine,U: 140.9 mg/dL
Microalb Creat Ratio: 5.7 mg/g (ref 0.0–30.0)
Microalb, Ur: 8 mg/dL — ABNORMAL HIGH (ref 0.0–1.9)

## 2023-11-03 LAB — CBC
HCT: 48.4 % (ref 39.0–52.0)
Hemoglobin: 15.9 g/dL (ref 13.0–17.0)
MCHC: 32.8 g/dL (ref 30.0–36.0)
MCV: 98.9 fL (ref 78.0–100.0)
Platelets: 204 10*3/uL (ref 150.0–400.0)
RBC: 4.89 Mil/uL (ref 4.22–5.81)
RDW: 13.6 % (ref 11.5–15.5)
WBC: 5.2 10*3/uL (ref 4.0–10.5)

## 2023-11-03 LAB — HEMOGLOBIN A1C: Hgb A1c MFr Bld: 9.6 % — ABNORMAL HIGH (ref 4.6–6.5)

## 2023-11-03 LAB — LIPID PANEL
Cholesterol: 142 mg/dL (ref 0–200)
HDL: 57.2 mg/dL (ref >39.00)
LDL Cholesterol: 70 mg/dL (ref 0–99)
NonHDL: 84.51
Total CHOL/HDL Ratio: 2
Triglycerides: 74 mg/dL (ref 0.0–149.0)
VLDL: 14.8 mg/dL (ref 0.0–40.0)

## 2023-11-03 LAB — PSA: PSA: 1.45 ng/mL (ref 0.10–4.00)

## 2023-11-03 LAB — TSH: TSH: 2.4 u[IU]/mL (ref 0.35–5.50)

## 2023-11-03 MED ORDER — INSULIN PEN NEEDLE 29G X 8MM MISC: 1 refills | Status: DC

## 2023-11-03 MED ORDER — INSULIN GLARGINE 100 UNITS/ML SOLOSTAR PEN: 10.0000 [IU] | PEN_INJECTOR | Freq: Every day | SUBCUTANEOUS | 0 refills | Status: DC

## 2023-11-03 NOTE — Progress Notes (Signed)
Subjective:    Patient ID: Jason [name], male    DOB: 18-Aug-1956, 67 y.o.   MRN: [id number]  HPI Patient presents for yearly preventative medicine examination. He is a 67 year old male who  has a past medical history of Arthritis, Bell palsy, Diabetes mellitus without complication (HCC), HTN (hypertension), and Hyperlipidemia.  Diabetes mellitus type II-managed with metformin 1000 mg twice daily, Jardiance 25 mg daily, and Ozempic 2 mg weekly ( he never picked that up due to being in the donut hole).  He does check his blood sugars periodically at home with readings in the 100-170 range. He has changed his diet " dramatically" and has been more active.   Lab Results  Component Value Date   HGBA1C 7.8 (A) 08/04/2023   Wt Readings from Last 3 Encounters:  11/03/23 237 lb (107.5 kg)  09/13/23 240 lb (108.9 kg)  08/08/23 (!) 340 lb (154.2 kg)    HTN -managed with Cardizem 120 mg daily.  He does not monitor his blood pressure at home.  Denies dizziness, lightheadedness, chest pain, or shortness of breath.  BP Readings from Last 3 Encounters:  11/03/23 (!) 150/90  08/08/23 134/82  08/04/23 110/60   Hyperlipidemia-managed with Crestor 5 mg daily.  He denies myalgia or fatigue Lab Results  Component Value Date   CHOL 117 07/29/2022   HDL 60.90 07/29/2022   LDLCALC 45 07/29/2022   TRIG 57.0 07/29/2022   CHOLHDL 2 07/29/2022    All immunizations and health maintenance protocols were reviewed with the patient and needed orders were placed.  Appropriate screening laboratory values were ordered for the patient including screening of hyperlipidemia, renal function and hepatic function. If indicated by BPH, a PSA was ordered.  Medication reconciliation,  past medical history, social history, problem list and allergies were reviewed in detail with the patient  Goals were established with regard to weight loss, exercise, and  diet in compliance with medications  He is up to date on  routine colon cancer screening   Review of Systems  Constitutional: Negative.   HENT: Negative.    Eyes: Negative.   Respiratory: Negative.    Cardiovascular: Negative.   Gastrointestinal: Negative.   Endocrine: Negative.   Genitourinary: Negative.   Musculoskeletal:  Positive for arthralgias and back pain.  Skin: Negative.   Allergic/Immunologic: Negative.   Neurological: Negative.   Hematological: Negative.   Psychiatric/Behavioral: Negative.    All other systems reviewed and are negative.  Past Medical History:  Diagnosis Date   Arthritis    Bell palsy    Diabetes mellitus without complication (HCC)    HTN (hypertension)    Hyperlipidemia     Social History   Socioeconomic History   Marital status: Widowed    Spouse name: Not on file   Number of children: 2   Years of education: 12   Highest education level: Not on file  Occupational History   Occupation: transportation    Comment: Advertising account planner  Tobacco Use   Smoking status: Never   Smokeless tobacco: Never  Vaping Use   Vaping status: Never Used  Substance and Sexual Activity   Alcohol use: Yes    Alcohol/week: 6.0 standard drinks of alcohol    Types: 6 Cans of beer per week    Comment: on weekends   Drug use: Never   Sexual activity: Not on file  Other Topics Concern   Not on file  Social History Narrative  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       Fun: fix cars, drag race    Social Determinants of Health   Financial Resource Strain: Low Risk  (09/13/2023)   Overall Financial Resource Strain (CARDIA)    Difficulty of Paying Living Expenses: Not hard at all  Food Insecurity: No Food Insecurity (09/13/2023)   Hunger Vital Sign    Worried About Running Out of Food in the Last Year: Never true    Ran Out of Food in the Last Year: Never true  Transportation Needs: No Transportation Needs (09/13/2023)   PRAPARE - Administrator, Civil Service  (Medical): No    Lack of Transportation (Non-Medical): No  Physical Activity: Insufficiently Active (09/13/2023)   Exercise Vital Sign    Days of Exercise per Week: 3 days    Minutes of Exercise per Session: 20 min  Stress: No Stress Concern Present (09/13/2023)   [employer] of Occupational Health - Occupational Stress Questionnaire    Feeling of Stress : Not at all  Social Connections: Moderately Integrated (09/13/2023)   Social Connection and Isolation Panel [NHANES]    Frequency of Communication with Friends and Family: More than three times a week    Frequency of Social Gatherings with Friends and Family: More than three times a week    Attends Religious Services: More than 4 times per year    Active Member of [employer] or Organizations: Yes    Attends Banker Meetings: More than 4 times per year    Marital Status: Widowed  Intimate Partner Violence: Not At Risk (09/13/2023)   Humiliation, Afraid, Rape, and Kick questionnaire    Fear of Current or Ex-Partner: No    Emotionally Abused: No    Physically Abused: No    Sexually Abused: No    Past Surgical History:  Procedure Laterality Date   COLONOSCOPY     COLONOSCOPY W/ POLYPECTOMY      Family History  Problem Relation Age of Onset   Stroke Mother    Colon cancer Sister    High blood pressure Sister    Colon cancer Brother    High blood pressure Brother    Stomach cancer Brother    Lymphoma Son    Colon cancer Other        1st degree relative <60   Diabetes Other        1st degree relative    Stroke Other    Esophageal cancer Neg Hx    Rectal cancer Neg Hx     Allergies  Allergen Reactions   Lisinopril     hyperkalemia   Reglan [Metoclopramide] Anxiety    Patient had bad reaction requiring ativan in addition to benadryl. He doesn't want to receive it any more    Current Outpatient Medications on File Prior to Visit  Medication Sig Dispense Refill   aspirin-acetaminophen-caffeine (EXCEDRIN  MIGRAINE) 250-250-65 MG tablet Take 1 tablet by mouth every 6 (six) hours as needed for headache.     Blood Glucose Monitoring Suppl (ONE TOUCH ULTRA 2) w/Device KIT Use the device daily to check your blood sugars 1-4 times as instructed. 1 kit 0   Continuous Blood Gluc Sensor (FREESTYLE LIBRE 3 SENSOR) MISC Use to monitor glucose 2 each 6   diltiazem (CARDIZEM CD) 120 MG 24 hr capsule TAKE 1 CAPSULE BY MOUTH EVERY DAY 90 capsule 3   JARDIANCE 25 MG TABS tablet  TAKE 1 TABLET (25 MG TOTAL) BY MOUTH DAILY. 90 tablet 1   Lancets (ONETOUCH ULTRASOFT) lancets Use to check blood sugar BID 100 each 12   metFORMIN (GLUCOPHAGE-XR) 500 MG 24 hr tablet TAKE 2 TABLETS (1,000 MG TOTAL) BY MOUTH 2 (TWO) TIMES DAILY WITH A MEAL. 360 tablet 1   Multiple Vitamins-Minerals (CENTRUM SILVER PO) Take 1 tablet by mouth daily.     ondansetron (ZOFRAN) 4 MG tablet Take 1 tablet (4 mg total) by mouth every 8 (eight) hours as needed for nausea or vomiting. 20 tablet 0   ONETOUCH VERIO test strip USE TO TEST BLOOD SUGAR TWICE DAILY 100 strip 5   rosuvastatin (CRESTOR) 5 MG tablet TAKE 1 TABLET BY MOUTH EVERYDAY AT BEDTIME 90 tablet 3   Semaglutide, 2 MG/DOSE, 8 MG/3ML SOPN Inject 2 mg as directed once a week. (Patient not taking: Reported on 11/03/2023) 3 mL 2   vitamin B-12 (CYANOCOBALAMIN) 1000 MCG tablet Take 1 tablet (1,000 mcg total) by mouth daily. 30 tablet 0   No current facility-administered medications on file prior to visit.    BP (!) 150/90   Pulse 94   Temp 98.2 F (36.8 C) (Oral)   Ht 6\' 2"  (1.88 m)   Wt 237 lb (107.5 kg)   SpO2 98%   BMI 30.43 kg/m       Objective:   Physical Exam Vitals and nursing note reviewed.  Constitutional:      General: He is not in acute distress.    Appearance: Normal appearance. He is not ill-appearing.  HENT:     Head: Normocephalic and atraumatic.     Right Ear: Tympanic membrane, ear canal and external ear normal. There is no impacted cerumen.     Left Ear:  Tympanic membrane, ear canal and external ear normal. There is no impacted cerumen.     Nose: Nose normal. No congestion or rhinorrhea.     Mouth/Throat:     Mouth: Mucous membranes are moist.     Pharynx: Oropharynx is clear.  Eyes:     Extraocular Movements: Extraocular movements intact.     Conjunctiva/sclera: Conjunctivae normal.     Pupils: Pupils are equal, round, and reactive to light.  Neck:     Vascular: No carotid bruit.  Cardiovascular:     Rate and Rhythm: Normal rate and regular rhythm.     Pulses: Normal pulses.     Heart sounds: No murmur heard.    No friction rub. No gallop.  Pulmonary:     Effort: Pulmonary effort is normal.     Breath sounds: Normal breath sounds.  Abdominal:     General: Abdomen is flat. Bowel sounds are normal. There is no distension.     Palpations: Abdomen is soft. There is no mass.     Tenderness: There is no abdominal tenderness. There is no guarding or rebound.     Hernia: No hernia is present.  Musculoskeletal:        General: Normal range of motion.     Cervical back: Normal range of motion and neck supple.  Lymphadenopathy:     Cervical: No cervical adenopathy.  Skin:    General: Skin is warm and dry.     Capillary Refill: Capillary refill takes less than 2 seconds.  Neurological:     General: No focal deficit present.     Mental Status: He is alert and oriented to person, place, and time.  Psychiatric:  Mood and Affect: Mood normal.        Behavior: Behavior normal.        Thought Content: Thought content normal.        Judgment: Judgment normal.        Assessment & Plan:  1. Routine general medical examination at a health care facility Today patient counseled on age appropriate routine health concerns for screening and prevention, each reviewed and up to date or declined. Immunizations reviewed and up to date or declined. Labs ordered and reviewed. Risk factors for depression reviewed and negative. Hearing function and  visual acuity are intact. ADLs screened and addressed as needed. Functional ability and level of safety reviewed and appropriate. Education, counseling and referrals performed based on assessed risks today. Patient provided with a copy of personalized plan for preventive services. - Follow up in one year or sooner If needed  2. Diabetes mellitus treated with oral medication (HCC) Samples of Jardiance 25 mg were given to the patient, quantity 5, Lot Number [id number] - Consider adding agent - Continue to eat healthy and exercise  - Follow up in 3 months  - Lipid panel; Future - TSH; Future - CBC; Future - Comprehensive metabolic panel; Future - Hemoglobin A1c; Future - Microalbumin/Creatinine Ratio, Urine; Future  3. Long-term current use of injectable noninsulin antidiabetic medication - Consider adding Ozempic back after Jan 1 - Lipid panel; Future - TSH; Future - CBC; Future - Comprehensive metabolic panel; Future - Hemoglobin A1c; Future - Microalbumin/Creatinine Ratio, Urine; Future  4. Essential hypertension - Elevated today due to not taking medication  - Lipid panel; Future - TSH; Future - CBC; Future - Comprehensive metabolic panel; Future  5. Mixed hyperlipidemia - Consider increasing statin  - Lipid panel; Future - TSH; Future - CBC; Future - Comprehensive metabolic panel; Future  6. Need for influenza vaccination  - Flu Vaccine Trivalent High Dose (Fluad)  7. Prostate cancer screening  - PSA; Future  [employer]

## 2023-11-03 NOTE — Patient Instructions (Signed)
It was great seeing you today   We will follow up with you regarding your lab work   Please let me know if you need anything   

## 2023-12-29 ENCOUNTER — Telehealth: Payer: Self-pay

## 2023-12-29 NOTE — Progress Notes (Signed)
Care Guide Pharmacy Note  12/29/2023 Name: Jason [name] MRN: [id number] DOB: 07/13/1956  Referred By: Shirline Frees, NP Reason for referral: Care Coordination ([facility] Diabetes. )   Jason [name] is a 68 y.o. year old male who is a primary care patient of Shirline Frees, NP.  Jason [name] was referred to the pharmacist for assistance related to: DMII  Successful contact was made with the patient to discuss pharmacy services including being ready for the pharmacist to call at least 5 minutes before the scheduled appointment time and to have medication bottles and any blood pressure readings ready for review. The patient agreed to meet with the pharmacist via telephone visit on (date/time). 01/08/24 at 3:00 p.m.   [name]  [facility], [facility] Health Care Management Assistant Direct Dial: [phone]  Fax: [phone]

## 2024-01-08 ENCOUNTER — Other Ambulatory Visit (INDEPENDENT_AMBULATORY_CARE_PROVIDER_SITE_OTHER): Payer: Medicare Other

## 2024-01-08 DIAGNOSIS — E1169 Type 2 diabetes mellitus with other specified complication: Secondary | ICD-10-CM

## 2024-01-08 DIAGNOSIS — E782 Mixed hyperlipidemia: Secondary | ICD-10-CM

## 2024-01-08 DIAGNOSIS — I10 Essential (primary) hypertension: Secondary | ICD-10-CM

## 2024-01-09 NOTE — Progress Notes (Signed)
01/09/2024 Name: Jason [name] MRN: [id number] DOB: 1955/12/24  Chief Complaint  Patient presents with   Diabetes   Hypertension   Hyperlipidemia   Medication Management    Jason [name] is a 68 y.o. year old male who presented for a telephone visit.   They were referred to the pharmacist by their PCP for assistance in managing diabetes, medication access, and complex medication management.    Subjective:  Care Team: Primary Care Provider: Shirline Frees, NP ; Next Scheduled Visit: 02/06/24  Medication Access/Adherence  Current Pharmacy:  [id number] - [place], [place] - [address] [address] Phone: [phone] Fax: [phone]   Patient reports affordability concerns with their medications: Yes  - Jardiance Patient reports access/transportation concerns to their pharmacy: No  Patient reports adherence concerns with their medications:  No  -states he has all medications and takes them as prescribed, did mae note on late to fill dates with patient but he denies any missed doses   Diabetes:  Current medications: Lantus 10 units in morning, Jardiance 25mg  daily, Metformin XR 500mg  2 tabs BID Medications tried in the past: Ozempic (cost), Trulicity (there was a shortage at that time, switched to ozempic), Glipizide  Current glucose readings: 140s-160 the last two weeks Testing fasting sugars once daily  States he needs to be better about his carb intake  Reports [facility] nurse stopped by last week and checked A1C and obtained a level of 7.4  Observed patterns:  Patient denies hypoglycemic s/sx including dizziness, shakiness, sweating. Patient denies hyperglycemic symptoms including polyuria, polydipsia, polyphagia, nocturia, neuropathy, blurred vision.   Current medication access support: None  Hypertension:  Current medications: Diltiazem CD 120mg  Medications previously tried: Lisinopril (hyperkalemia)  Patient  has a validated, automated, upper arm home BP cuff Current blood pressure readings readings: Not been using but states a nurse with [facility] came out and BP was 138/90 at that time  Patient denies hypotensive s/sx including dizziness, lightheadedness.  Patient denies hypertensive symptoms including headache, chest pain, shortness of breath   Hyperlipidemia/ASCVD Risk Reduction  Current lipid lowering medications: Rosuvastatin 5mg  Medications tried in the past: None  Patient reports adherence and no missed doses but last filled 5 months ago. Stressed importance of adherence.   Objective:  Lab Results  Component Value Date   HGBA1C 9.6 (H) 11/03/2023    Lab Results  Component Value Date   CREATININE 1.25 11/03/2023   BUN 21 11/03/2023   NA 140 11/03/2023   K 5.3 No hemolysis seen (H) 11/03/2023   CL 104 11/03/2023   CO2 28 11/03/2023    Lab Results  Component Value Date   CHOL 142 11/03/2023   HDL 57.20 11/03/2023   LDLCALC 70 11/03/2023   TRIG 74.0 11/03/2023   CHOLHDL 2 11/03/2023    Medications Reviewed Today     Reviewed by Jason [name], RPH (Pharmacist) on 01/09/24 at [phone]  Med List Status: <None>   Medication Order Taking? Sig Documenting Provider Last Dose Status Informant  Blood Glucose Monitoring Suppl (ONE TOUCH ULTRA 2) w/Device KIT [id number]  Use the device daily to check your blood sugars 1-4 times as instructed. [name], Jason Keen, NP  Active Spouse/Significant Other  diltiazem (CARDIZEM CD) 120 MG 24 hr capsule [id number] Yes TAKE 1 CAPSULE BY MOUTH EVERY DAY [name], Jason Keen, NP Taking Active   insulin glargine (LANTUS) 100 unit/mL SOPN [id number] Yes Inject 10 Units into the skin daily. Jason Frees, NP Taking Active  Insulin Pen Needle 29G X MISC [id number]  Use with insulin pen [name], Jason Keen, NP  Active   JARDIANCE 25 MG TABS tablet [id number] Yes TAKE 1 TABLET (25 MG TOTAL) BY MOUTH DAILY. [name], Jason Keen, NP Taking Active   Lancets [facility]  ULTRASOFT) lancets [id number]  Use to check blood sugar BID [name], Jason Keen, NP  Active Spouse/Significant Other  metFORMIN (GLUCOPHAGE-XR) 500 MG 24 hr tablet [id number]  TAKE 2 TABLETS (1,000 MG TOTAL) BY MOUTH 2 (TWO) TIMES DAILY WITH A MEAL. Jason Frees, NP  Expired 12/11/23 2359   Multiple Vitamins-Minerals (CENTRUM SILVER PO) [id number] Yes Take 1 tablet by mouth daily. [provider] Taking Active Spouse/Significant Other  naproxen sodium (ALEVE) 220 MG tablet [id number] Yes Take 220 mg by mouth daily as needed (pain/headache). [provider]  Active   ondansetron (ZOFRAN) 4 MG tablet [id number]  Take 1 tablet (4 mg total) by mouth every 8 (eight) hours as needed for nausea or vomiting. Darrick Grinder, PA-C  Active   [facility] VERIO test strip [id number]  USE TO TEST BLOOD SUGAR TWICE DAILY [name], Jason Keen, NP  Active   rosuvastatin (CRESTOR) 5 MG tablet [id number]  TAKE 1 TABLET BY MOUTH EVERYDAY AT BEDTIME [name], Jason Keen, NP  Active   vitamin B-12 (CYANOCOBALAMIN) 1000 MCG tablet [id number] Yes Take 1 tablet (1,000 mcg total) by mouth daily. Leroy Sea, MD Taking Active Spouse/Significant Other              Assessment/Plan:   Diabetes: - Currently uncontrolled - Reviewed long term cardiovascular and renal outcomes of uncontrolled blood sugar - Reviewed goal A1c, goal fasting, and goal 2 hour post prandial glucose - Reviewed dietary modifications including low carb diet - Recommend to switch Jardiance to [place] as patient would qualify for PAP  - Patient denies personal or family history of multiple endocrine neoplasia type 2, medullary thyroid cancer; personal history of pancreatitis or gallbladder disease. - Recommend to check glucose once daily at varying times - Meets financial criteria for [place] patient assistance program through PAP. Will collaborate with provider, CPhT, and patient to pursue assistance.  -Future consideration: Restart Ozempic  through PAP in hopes of getting patient off insulin     Hypertension: - Currently uncontrolled - Reviewed long term cardiovascular and renal outcomes of uncontrolled blood pressure - Reviewed appropriate blood pressure monitoring technique and reviewed goal blood pressure. Recommended to check home blood pressure and heart rate at least once weekly - Recommend to continue current medication therapy. If BP remains out of goal, consider addition of thiazide diuretic.      Hyperlipidemia/ASCVD Risk Reduction: - Currently controlled.  - Reviewed long term complications of uncontrolled cholesterol - Recommend to continue current medication therapy     Follow Up Plan: 03/04/24 (1 month after PCP visit)  Jason [name], PharmD Clinical Pharmacist [phone]

## 2024-01-17 ENCOUNTER — Telehealth: Payer: Self-pay

## 2024-01-17 NOTE — Progress Notes (Signed)
   01/17/2024  Patient ID: Jason [name], male   DOB: Apr 26, 1956, 68 y.o.   MRN: [id number]  Received notification from [employer] and [employer] that patient has been approved to receive Farxiga  and Ozempic  from companies at no charge through 12/11/24.   Patient has samples of farxiga  on hand and will continue to use until shipment arrives. Ozempic  is having delays but we will contact him once it arrives. In the interim, patient will continue to use insulin .  Jason [name], PharmD Clinical Pharmacist [phone]

## 2024-01-19 DIAGNOSIS — H524 Presbyopia: Secondary | ICD-10-CM | POA: Diagnosis not present

## 2024-01-19 DIAGNOSIS — E119 Type 2 diabetes mellitus without complications: Secondary | ICD-10-CM | POA: Diagnosis not present

## 2024-01-19 DIAGNOSIS — H35033 Hypertensive retinopathy, bilateral: Secondary | ICD-10-CM | POA: Diagnosis not present

## 2024-01-19 DIAGNOSIS — H2513 Age-related nuclear cataract, bilateral: Secondary | ICD-10-CM | POA: Diagnosis not present

## 2024-01-19 LAB — HM DIABETES EYE EXAM

## 2024-02-01 ENCOUNTER — Other Ambulatory Visit: Payer: Self-pay | Admitting: Adult Health

## 2024-02-06 ENCOUNTER — Encounter: Payer: Self-pay | Admitting: Adult Health

## 2024-02-06 ENCOUNTER — Ambulatory Visit (INDEPENDENT_AMBULATORY_CARE_PROVIDER_SITE_OTHER): Payer: Medicare Other | Admitting: Adult Health

## 2024-02-06 VITALS — BP 136/78 | HR 68 | Temp 98.2°F | Ht 74.0 in | Wt 241.0 lb

## 2024-02-06 DIAGNOSIS — Z794 Long term (current) use of insulin: Secondary | ICD-10-CM

## 2024-02-06 DIAGNOSIS — I10 Essential (primary) hypertension: Secondary | ICD-10-CM | POA: Diagnosis not present

## 2024-02-06 DIAGNOSIS — E119 Type 2 diabetes mellitus without complications: Secondary | ICD-10-CM

## 2024-02-06 DIAGNOSIS — Z7984 Long term (current) use of oral hypoglycemic drugs: Secondary | ICD-10-CM

## 2024-02-06 DIAGNOSIS — Z7985 Long-term (current) use of injectable non-insulin antidiabetic drugs: Secondary | ICD-10-CM | POA: Diagnosis not present

## 2024-02-06 LAB — POCT GLYCOSYLATED HEMOGLOBIN (HGB A1C): Hemoglobin A1C: 8.4 % — AB (ref 4.0–5.6)

## 2024-02-06 NOTE — Progress Notes (Signed)
 Subjective:    Patient ID: Jason [name], male    DOB: [birth year], 69 y.o.   MRN: [id number]  HPI 68 year old male who  has a past medical history of Arthritis, Bell palsy, Diabetes mellitus without complication (HCC), HTN (hypertension), and Hyperlipidemia.  Diabetes mellitus type II-managed with metformin 1000 mg twice daily, Farxiga 5 mg daily ( through patient assistance) and Ozempic 2 mg weekly ( through patient assistance but has not started yet), and Lantus 10 units daily.   He does check his blood sugars periodically at home with readings in the 140-160 range range. He has cut back on sugars and is trying to cut back on carbs.  Lab Results  Component Value Date   HGBA1C 8.4 (A) 02/06/2024   HGBA1C 9.6 (H) 11/03/2023   HGBA1C 7.8 (A) 08/04/2023   Wt Readings from Last 3 Encounters:  02/06/24 241 lb (109.3 kg)  11/03/23 237 lb (107.5 kg)  09/13/23 240 lb (108.9 kg)   HTN -managed with Cardizem 120 mg daily.  He does not monitor his blood pressure at home.  Denies dizziness, lightheadedness, chest pain, or shortness of breath.   BP Readings from Last 3 Encounters:  02/06/24 136/78  11/03/23 (!) 150/90  08/08/23 134/82     Review of Systems  See HPI  Past Medical History:  Diagnosis Date   Arthritis    Bell palsy    Diabetes mellitus without complication (HCC)    HTN (hypertension)    Hyperlipidemia     Social History   Socioeconomic History   Marital status: Widowed    Spouse name: Not on file   Number of children: 2   Years of education: 12   Highest education level: Not on file  Occupational History   Occupation: transportation    Comment: Advertising account planner  Tobacco Use   Smoking status: Never   Smokeless tobacco: Never  Vaping Use   Vaping status: Never Used  Substance and Sexual Activity   Alcohol use: Yes    Alcohol/week: 6.0 standard drinks of alcohol    Types: 6 Cans of beer per week    Comment: on weekends   Drug use: Never    Sexual activity: Not on file  Other Topics Concern   Not on file  Social History Narrative      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       Fun: fix cars, drag race    Social Drivers of Corporate investment banker Strain: Low Risk  (09/13/2023)   Overall Financial Resource Strain (CARDIA)    Difficulty of Paying Living Expenses: Not hard at all  Food Insecurity: No Food Insecurity (09/13/2023)   Hunger Vital Sign    Worried About Running Out of Food in the Last Year: Never true    Ran Out of Food in the Last Year: Never true  Transportation Needs: No Transportation Needs (09/13/2023)   PRAPARE - Administrator, Civil Service (Medical): No    Lack of Transportation (Non-Medical): No  Physical Activity: Insufficiently Active (09/13/2023)   Exercise Vital Sign    Days of Exercise per Week: 3 days    Minutes of Exercise per Session: 20 min  Stress: No Stress Concern Present (09/13/2023)   [employer] of Occupational Health - Occupational Stress Questionnaire    Feeling of Stress : Not at all  Social Connections: Moderately Integrated (09/13/2023)   Social  Connection and Isolation Panel [NHANES]    Frequency of Communication with Friends and Family: More than three times a week    Frequency of Social Gatherings with Friends and Family: More than three times a week    Attends Religious Services: More than 4 times per year    Active Member of [employer] or Organizations: Yes    Attends Banker Meetings: More than 4 times per year    Marital Status: Widowed  Intimate Partner Violence: Not At Risk (09/13/2023)   Humiliation, Afraid, Rape, and Kick questionnaire    Fear of Current or Ex-Partner: No    Emotionally Abused: No    Physically Abused: No    Sexually Abused: No    Past Surgical History:  Procedure Laterality Date   COLONOSCOPY     COLONOSCOPY W/ POLYPECTOMY      Family History  Problem Relation Age of Onset   Stroke  Mother    Colon cancer Sister    High blood pressure Sister    Colon cancer Brother    High blood pressure Brother    Stomach cancer Brother    Lymphoma Son    Colon cancer Other        1st degree relative <60   Diabetes Other        1st degree relative    Stroke Other    Esophageal cancer Neg Hx    Rectal cancer Neg Hx     Allergies  Allergen Reactions   Lisinopril     hyperkalemia   Reglan [Metoclopramide] Anxiety    Patient had bad reaction requiring ativan in addition to benadryl. He doesn't want to receive it any more    Current Outpatient Medications on File Prior to Visit  Medication Sig Dispense Refill   Blood Glucose Monitoring Suppl (ONE TOUCH ULTRA 2) w/Device KIT Use the device daily to check your blood sugars 1-4 times as instructed. 1 kit 0   dapagliflozin propanediol (FARXIGA) 5 MG TABS tablet Take 5 mg by mouth daily.     diltiazem (CARDIZEM CD) 120 MG 24 hr capsule TAKE 1 CAPSULE BY MOUTH EVERY DAY 90 capsule 3   insulin glargine (LANTUS SOLOSTAR) 100 UNIT/ML Solostar Pen INJECT 10 UNITS INTO THE SKIN DAILY 5 mL 1   Insulin Pen Needle 29G X MISC Use with insulin pen 100 each 1   JARDIANCE 25 MG TABS tablet TAKE 1 TABLET (25 MG TOTAL) BY MOUTH DAILY. 90 tablet 1   Lancets (ONETOUCH ULTRASOFT) lancets Use to check blood sugar BID 100 each 12   metFORMIN (GLUCOPHAGE-XR) 500 MG 24 hr tablet TAKE 2 TABLETS (1,000 MG TOTAL) BY MOUTH 2 (TWO) TIMES DAILY WITH A MEAL. 360 tablet 1   Multiple Vitamins-Minerals (CENTRUM SILVER PO) Take 1 tablet by mouth daily.     naproxen sodium (ALEVE) 220 MG tablet Take 220 mg by mouth daily as needed (pain/headache).     ondansetron (ZOFRAN) 4 MG tablet Take 1 tablet (4 mg total) by mouth every 8 (eight) hours as needed for nausea or vomiting. 20 tablet 0   ONETOUCH VERIO test strip USE TO TEST BLOOD SUGAR TWICE DAILY 100 strip 5   rosuvastatin (CRESTOR) 5 MG tablet TAKE 1 TABLET BY MOUTH EVERYDAY AT BEDTIME 90 tablet 3    Semaglutide,0.25 or 0.5MG /DOS, (OZEMPIC, 0.25 OR 0.5 MG/DOSE,) 2 MG/1.5ML SOPN Inject 0.25 mg into the skin once a week.     vitamin B-12 (CYANOCOBALAMIN) 1000 MCG tablet Take 1 tablet (  1,000 mcg total) by mouth daily. 30 tablet 0   No current facility-administered medications on file prior to visit.    BP 136/78   Pulse 68   Temp 98.2 F (36.8 C) (Oral)   Ht 6\' 2"  (1.88 m)   Wt 241 lb (109.3 kg)   SpO2 96%   BMI 30.94 kg/m       Objective:   Physical Exam Vitals and nursing note reviewed.  Constitutional:      Appearance: Normal appearance. He is obese.  Cardiovascular:     Rate and Rhythm: Normal rate and regular rhythm.     Pulses: Normal pulses.     Heart sounds: Normal heart sounds.  Pulmonary:     Effort: Pulmonary effort is normal.     Breath sounds: Normal breath sounds.  Skin:    General: Skin is warm and dry.  Neurological:     General: No focal deficit present.     Mental Status: He is alert and oriented to person, place, and time.  Psychiatric:        Mood and Affect: Mood normal.        Behavior: Behavior normal.        Thought Content: Thought content normal.        Judgment: Judgment normal.       Assessment & Plan:  1. Diabetes mellitus treated with oral medication (HCC) (Primary)  - POC HgB A1c- 8.4  - will have him continue with Metformin and Farxiga 5 mg daily.   2. Current use of insulin (HCC) - Continue with Lantus 10 units daily   3. Long-term current use of injectable noninsulin antidiabetic medication - PAP ozempic given to patient. Will restart at 0.25 mg weekly. Will tirate him up quickly.  - Continue with lantus 10 units for first 2 weeks while being on Ozempic   4. Essential hypertension - Controlled. No change in medication   Shirline Frees, NP

## 2024-02-06 NOTE — Patient Instructions (Signed)
 Health Maintenance Due  Topic Date Due   Zoster Vaccines- Shingrix (2 of 2) 04/22/2021   COVID-19 Vaccine (4 - 2024-25 season) 08/13/2023       09/13/2023    2:13 PM 10/28/2022    8:11 AM 10/19/2022    2:01 PM  Depression screen PHQ 2/9  Decreased Interest 0 0 0  Down, Depressed, Hopeless 0 0 0  PHQ - 2 Score 0 0 0  Altered sleeping  0   Tired, decreased energy  0   Change in appetite  0   Feeling bad or failure about yourself   0   Trouble concentrating  0   Moving slowly or fidgety/restless  0   Suicidal thoughts  0   PHQ-9 Score  0   Difficult doing work/chores  Not difficult at all

## 2024-02-27 ENCOUNTER — Other Ambulatory Visit: Payer: Self-pay | Admitting: Adult Health

## 2024-02-27 DIAGNOSIS — E1169 Type 2 diabetes mellitus with other specified complication: Secondary | ICD-10-CM

## 2024-03-04 ENCOUNTER — Other Ambulatory Visit (INDEPENDENT_AMBULATORY_CARE_PROVIDER_SITE_OTHER): Payer: Medicare Other

## 2024-03-04 DIAGNOSIS — E1169 Type 2 diabetes mellitus with other specified complication: Secondary | ICD-10-CM

## 2024-03-04 NOTE — Progress Notes (Signed)
 03/04/2024 Name: Jason [name] MRN: [id number] DOB: 1956-11-03  Chief Complaint  Patient presents with   Diabetes   Medication Management    Jason [name] is a 68 y.o. year old male who presented for a telephone visit.   They were referred to the pharmacist by their PCP for assistance in managing diabetes, medication access, and complex medication management.    Subjective:  Care Team: Primary Care Provider: Shirline Frees, NP ; Next Scheduled Visit: 05/07/24  Medication Access/Adherence  Current Pharmacy:  [id number] - [place], [place] - [address] [place] [address] Phone: [phone] Fax: [phone]   Patient reports affordability concerns with their medications: No Patient reports access/transportation concerns to their pharmacy: No  Patient reports adherence concerns with their medications:  No  -states he has all medications and takes them as prescribed, did mae note on late to fill dates with patient but he denies any missed doses   Diabetes:  Current medications: Lantus 10 units in morning (taking every other day or so), Farxiga 5mg , Ozempic 0.25mg  Medications tried in the past: Trulicity (stopped due to cost)  Current glucose readings: 120 yesterday fasting, denies seeing anything above 140 fasting since starting the ozempic Testing fasting sugars once daily  States he needs to be better about his carb intake  Has given himself 3 injections of the ozempic at 0.25mg , due for 4th dose on 03/09/24  Observed patterns:  Patient denies hypoglycemic s/sx including dizziness, shakiness, sweating. Patient denies hyperglycemic symptoms including polyuria, polydipsia, polyphagia, nocturia, neuropathy, blurred vision.   Current medication access support: Farxiga 5mg  (AZ&Me), Ozempic (Novo)     Objective:  Lab Results  Component Value Date   HGBA1C 8.4 (A) 02/06/2024    Lab Results  Component Value Date    CREATININE 1.25 11/03/2023   BUN 21 11/03/2023   NA 140 11/03/2023   K 5.3 No hemolysis seen (H) 11/03/2023   CL 104 11/03/2023   CO2 28 11/03/2023    Lab Results  Component Value Date   CHOL 142 11/03/2023   HDL 57.20 11/03/2023   LDLCALC 70 11/03/2023   TRIG 74.0 11/03/2023   CHOLHDL 2 11/03/2023    Medications Reviewed Today     Reviewed by Sherrill Raring, RPH (Pharmacist) on 03/04/24 at 1608  Med List Status: <None>   Medication Order Taking? Sig Documenting Provider Last Dose Status Informant  Blood Glucose Monitoring Suppl (ONE TOUCH ULTRA 2) w/Device KIT [id number]  Use the device daily to check your blood sugars 1-4 times as instructed. Nafziger, Kandee Keen, NP  Active Spouse/Significant Other  dapagliflozin propanediol (FARXIGA) 5 MG TABS tablet [id number] Yes Take 5 mg by mouth daily. [provider] Taking Active Self  diltiazem (CARDIZEM CD) 120 MG 24 hr capsule [id number] Yes TAKE 1 CAPSULE BY MOUTH EVERY DAY Nafziger, Kandee Keen, NP Taking Active   insulin glargine (LANTUS SOLOSTAR) 100 UNIT/ML Solostar Pen [id number]  INJECT 10 UNITS INTO THE SKIN DAILY Nafziger, Kandee Keen, NP  Active   Insulin Pen Needle 29G X MISC [id number]  Use with insulin pen Nafziger, Kandee Keen, NP  Active   JARDIANCE 25 MG TABS tablet [id number]  TAKE 1 TABLET (25 MG TOTAL) BY MOUTH DAILY. Nafziger, Kandee Keen, NP  Active   Lancets [facility] ULTRASOFT) lancets [id number]  Use to check blood sugar BID Nafziger, Kandee Keen, NP  Active Spouse/Significant Other  metFORMIN (GLUCOPHAGE-XR) 500 MG 24 hr tablet [id number]  TAKE 2 TABLETS (1,000 MG TOTAL) BY MOUTH 2 (TWO)  TIMES DAILY WITH A MEAL. Shirline Frees, NP  Expired 02/06/24 2359   Multiple Vitamins-Minerals (CENTRUM SILVER PO) [id number] Yes Take 1 tablet by mouth daily. [provider] Taking Active Spouse/Significant Other  naproxen sodium (ALEVE) 220 MG tablet [id number] Yes Take 220 mg by mouth daily as needed (pain/headache). [provider] Taking  Active   ondansetron (ZOFRAN) 4 MG tablet [id number] No Take 1 tablet (4 mg total) by mouth every 8 (eight) hours as needed for nausea or vomiting.  Patient not taking: Reported on 03/04/2024   Pamala Duffel Not Taking Active   [facility] VERIO test strip [id number]  USE TO TEST BLOOD SUGAR TWICE DAILY Nafziger, Kandee Keen, NP  Active   rosuvastatin (CRESTOR) 5 MG tablet [id number]  TAKE 1 TABLET BY MOUTH EVERYDAY AT BEDTIME Nafziger, Kandee Keen, NP  Active   Semaglutide,0.25 or 0.5MG /DOS, (OZEMPIC, 0.25 OR 0.5 MG/DOSE,) 2 MG/1.5ML SOPN [id number]  Inject 0.25 mg into the skin once a week. [provider]  Active Spouse/Significant Other  vitamin B-12 (CYANOCOBALAMIN) 1000 MCG tablet [id number] Yes Take 1 tablet (1,000 mcg total) by mouth daily. Leroy Sea, MD Taking Active Spouse/Significant Other              Assessment/Plan:   Diabetes: - Currently uncontrolled - Reviewed long term cardiovascular and renal outcomes of uncontrolled blood sugar - Reviewed goal A1c, goal fasting, and goal 2 hour post prandial glucose - Reviewed dietary modifications including low carb diet - Patient denies personal or family history of multiple endocrine neoplasia type 2, medullary thyroid cancer; personal history of pancreatitis or gallbladder disease. - Recommend to check glucose once daily at varying times -INCREASE to Ozempic 0.5mg  on 03/16/24. Once you start that dose, may hold the lantus.      Follow Up Plan: 03/25/24  Sherrill Raring, PharmD Clinical Pharmacist [phone]

## 2024-03-19 ENCOUNTER — Telehealth: Payer: Self-pay

## 2024-03-19 NOTE — Telephone Encounter (Signed)
 Called pt to advised that Ozempic was ready for pick up. Pt verbalized understanding.

## 2024-03-25 ENCOUNTER — Other Ambulatory Visit

## 2024-03-25 DIAGNOSIS — E1169 Type 2 diabetes mellitus with other specified complication: Secondary | ICD-10-CM

## 2024-03-25 NOTE — Progress Notes (Signed)
 03/25/2024 Name: Jason [name] MRN: [id number] DOB: 1956-09-25  Chief Complaint  Patient presents with   Medication Management   Diabetes    ASAAD GULLEY is a 68 y.o. year old male who presented for a telephone visit.   They were referred to the pharmacist by their PCP for assistance in managing diabetes, medication access, and complex medication management.    Subjective:  Care Team: Primary Care Provider: Shirline Frees, NP ; Next Scheduled Visit: 05/07/24  Medication Access/Adherence  Current Pharmacy:  [id number] - [place], [place] - [address] [place] [address] Phone: [phone] Fax: [phone]   Patient reports affordability concerns with their medications: No Patient reports access/transportation concerns to their pharmacy: No  Patient reports adherence concerns with their medications:  No  -states he has all medications and takes them as prescribed, did make note on late to fill dates for crestor with patient but he denies any missed doses   Diabetes:  Current medications:  Farxiga 5mg , Ozempic 0.5mg  Medications tried in the past: Trulicity (stopped due to cost), Lantus (was just being used until patient could increase ozempic)  Current glucose readings: 124 this morning, denies seeing any above 130 since starting ozempic 0.5mg  dose, Reports post-prandial sugars normally around 160, denies any lows Testing fasting sugars once daily and sometimes post-prandial  States he needs to be better about his carb intake  Has given himself 3 injections of 0.5mg   Observed patterns:  Patient denies hypoglycemic s/sx including dizziness, shakiness, sweating. Patient denies hyperglycemic symptoms including polyuria, polydipsia, polyphagia, nocturia, neuropathy, blurred vision.   Current medication access support: Farxiga 5mg  (AZ&Me), Ozempic (Novo)     Objective:  Lab Results  Component Value Date   HGBA1C 8.4 (A)  02/06/2024    Lab Results  Component Value Date   CREATININE 1.25 11/03/2023   BUN 21 11/03/2023   NA 140 11/03/2023   K 5.3 No hemolysis seen (H) 11/03/2023   CL 104 11/03/2023   CO2 28 11/03/2023    Lab Results  Component Value Date   CHOL 142 11/03/2023   HDL 57.20 11/03/2023   LDLCALC 70 11/03/2023   TRIG 74.0 11/03/2023   CHOLHDL 2 11/03/2023    Medications Reviewed Today     Reviewed by Sherrill Raring, RPH (Pharmacist) on 03/25/24 at 1518  Med List Status: <None>   Medication Order Taking? Sig Documenting Provider Last Dose Status Informant  Blood Glucose Monitoring Suppl (ONE TOUCH ULTRA 2) w/Device KIT [id number]  Use the device daily to check your blood sugars 1-4 times as instructed. Nafziger, Kandee Keen, NP  Active Spouse/Significant Other  dapagliflozin propanediol (FARXIGA) 5 MG TABS tablet [id number] Yes Take 5 mg by mouth daily. [provider] Taking Active Self  diltiazem (CARDIZEM CD) 120 MG 24 hr capsule [id number] Yes TAKE 1 CAPSULE BY MOUTH EVERY DAY Nafziger, Kandee Keen, NP Taking Active   insulin glargine (LANTUS SOLOSTAR) 100 UNIT/ML Solostar Pen [id number] No INJECT 10 UNITS INTO THE SKIN DAILY  Patient not taking: Reported on 03/25/2024   Shirline Frees, NP Not Taking Active   Insulin Pen Needle 29G X MISC [id number]  Use with insulin pen Nafziger, Kandee Keen, NP  Active   Lancets (ONETOUCH ULTRASOFT) lancets [id number]  Use to check blood sugar BID Nafziger, Kandee Keen, NP  Active Spouse/Significant Other  metFORMIN (GLUCOPHAGE-XR) 500 MG 24 hr tablet [id number]  TAKE 2 TABLETS (1,000 MG TOTAL) BY MOUTH 2 (TWO) TIMES DAILY WITH A MEAL. Shirline Frees, NP  Expired 02/06/24 2359   Multiple Vitamins-Minerals (CENTRUM SILVER PO) [id number] Yes Take 1 tablet by mouth daily. [provider] Taking Active Spouse/Significant Other  naproxen sodium (ALEVE) 220 MG tablet [id number]  Take 220 mg by mouth daily as needed (pain/headache). [provider]  Active    [facility] VERIO test strip [id number]  USE TO TEST BLOOD SUGAR TWICE DAILY Nafziger, Randel Buss, NP  Active   rosuvastatin (CRESTOR) 5 MG tablet [id number] Yes TAKE 1 TABLET BY MOUTH EVERYDAY AT BEDTIME Nafziger, Randel Buss, NP Taking Active   Semaglutide,0.25 or 0.5MG /DOS, (OZEMPIC, 0.25 OR 0.5 MG/DOSE,) 2 MG/1.5ML SOPN [id number] Yes Inject 0.25 mg into the skin once a week. [provider] Taking Active Spouse/Significant Other  vitamin B-12 (CYANOCOBALAMIN) 1000 MCG tablet [id number] Yes Take 1 tablet (1,000 mcg total) by mouth daily. Cala Castleman, MD Taking Active Spouse/Significant Other              Assessment/Plan:   Diabetes: - Currently uncontrolled - Reviewed long term cardiovascular and renal outcomes of uncontrolled blood sugar - Reviewed goal A1c, goal fasting, and goal 2 hour post prandial glucose - Reviewed dietary modifications including low carb diet - Patient denies personal or family history of multiple endocrine neoplasia type 2, medullary thyroid cancer; personal history of pancreatitis or gallbladder disease. - Recommend to check glucose once daily at varying times -INCREASE to Ozempic 1mg  after 4 doses of 0.5mg  when next dose is due (giving on Saturdays) -PAP for ozempic 1mg  and 2mg  has arrived as ordered, patient aware and will pick up this week      Follow Up Plan: as indicated following PCP visit in May  Carnell Christian, PharmD Clinical Pharmacist [phone]

## 2024-03-26 NOTE — Telephone Encounter (Signed)
Medication has been picked up.  

## 2024-05-07 ENCOUNTER — Ambulatory Visit (INDEPENDENT_AMBULATORY_CARE_PROVIDER_SITE_OTHER): Payer: Medicare Other | Admitting: Adult Health

## 2024-05-07 VITALS — BP 136/84 | HR 64 | Temp 98.2°F | Ht 74.0 in | Wt 233.0 lb

## 2024-05-07 DIAGNOSIS — Z7985 Long-term (current) use of injectable non-insulin antidiabetic drugs: Secondary | ICD-10-CM

## 2024-05-07 DIAGNOSIS — I10 Essential (primary) hypertension: Secondary | ICD-10-CM | POA: Diagnosis not present

## 2024-05-07 DIAGNOSIS — E119 Type 2 diabetes mellitus without complications: Secondary | ICD-10-CM

## 2024-05-07 DIAGNOSIS — Z7984 Long term (current) use of oral hypoglycemic drugs: Secondary | ICD-10-CM

## 2024-05-07 LAB — POCT GLYCOSYLATED HEMOGLOBIN (HGB A1C): Hemoglobin A1C: 6.9 % — AB (ref 4.0–5.6)

## 2024-05-07 NOTE — Patient Instructions (Addendum)
 Your A1c has dropped to 6.9   Please continue to eat healthy and exercise  Follow up in 3 months

## 2024-05-07 NOTE — Progress Notes (Signed)
 Subjective:    Patient ID: Jason [name], male    DOB: 1956/01/20, 68 y.o.   MRN: [id number]  HPI 68 year old male who  has a past medical history of Arthritis, Bell palsy, Diabetes mellitus without complication (HCC), HTN (hypertension), and Hyperlipidemia.  He presents to the office today for follow up regarding DM and HTN   Diabetes mellitus type II-managed with metformin  1000 mg twice daily, Farxiga 5 mg daily ( through patient assistance) and Ozempic  1  mg weekly ( through patient assistance), a   He does check his blood sugars periodically at home with readings in the 98 - 127 range range. He has cut back on sugars and is trying to cut back on carbs.  Lab Results  Component Value Date   HGBA1C 6.9 (A) 05/07/2024   HGBA1C 8.4 (A) 02/06/2024   HGBA1C 9.6 (H) 11/03/2023   Wt Readings from Last 3 Encounters:  05/07/24 233 lb (105.7 kg)  02/06/24 241 lb (109.3 kg)  11/03/23 237 lb (107.5 kg)   HTN -managed with Cardizem  120 mg daily.  He does not monitor his blood pressure at home.  Denies dizziness, lightheadedness, chest pain, or shortness of breath.  BP Readings from Last 3 Encounters:  05/07/24 136/84  02/06/24 136/78  11/03/23 (!) 150/90    Review of Systems See HPI   Past Medical History:  Diagnosis Date   Arthritis    Bell palsy    Diabetes mellitus without complication (HCC)    HTN (hypertension)    Hyperlipidemia     Social History   Socioeconomic History   Marital status: Widowed    Spouse name: Not on file   Number of children: 2   Years of education: 12   Highest education level: Not on file  Occupational History   Occupation: transportation    Comment: Advertising account planner  Tobacco Use   Smoking status: Never   Smokeless tobacco: Never  Vaping Use   Vaping status: Never Used  Substance and Sexual Activity   Alcohol use: Yes    Alcohol/week: 6.0 standard drinks of alcohol    Types: 6 Cans of beer per week    Comment: on weekends    Drug use: Never   Sexual activity: Not on file  Other Topics Concern   Not on file  Social History Narrative      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       Fun: fix cars, drag race    Social Drivers of Corporate investment banker Strain: Low Risk  (09/13/2023)   Overall Financial Resource Strain (CARDIA)    Difficulty of Paying Living Expenses: Not hard at all  Food Insecurity: No Food Insecurity (09/13/2023)   Hunger Vital Sign    Worried About Running Out of Food in the Last Year: Never true    Ran Out of Food in the Last Year: Never true  Transportation Needs: No Transportation Needs (09/13/2023)   PRAPARE - Administrator, Civil Service (Medical): No    Lack of Transportation (Non-Medical): No  Physical Activity: Insufficiently Active (09/13/2023)   Exercise Vital Sign    Days of Exercise per Week: 3 days    Minutes of Exercise per Session: 20 min  Stress: No Stress Concern Present (09/13/2023)   [employer] of Occupational Health - Occupational Stress Questionnaire    Feeling of Stress : Not at all  Social  Connections: Moderately Integrated (09/13/2023)   Social Connection and Isolation Panel [NHANES]    Frequency of Communication with Friends and Family: More than three times a week    Frequency of Social Gatherings with Friends and Family: More than three times a week    Attends Religious Services: More than 4 times per year    Active Member of [employer] or Organizations: Yes    Attends Banker Meetings: More than 4 times per year    Marital Status: Widowed  Intimate Partner Violence: Not At Risk (09/13/2023)   Humiliation, Afraid, Rape, and Kick questionnaire    Fear of Current or Ex-Partner: No    Emotionally Abused: No    Physically Abused: No    Sexually Abused: No    Past Surgical History:  Procedure Laterality Date   COLONOSCOPY     COLONOSCOPY W/ POLYPECTOMY      Family History  Problem Relation Age  of Onset   Stroke Mother    Colon cancer Sister    High blood pressure Sister    Colon cancer Brother    High blood pressure Brother    Stomach cancer Brother    Lymphoma Son    Colon cancer Other        1st degree relative <60   Diabetes Other        1st degree relative    Stroke Other    Esophageal cancer Neg Hx    Rectal cancer Neg Hx     Allergies  Allergen Reactions   Lisinopril     hyperkalemia   Reglan  [Metoclopramide ] Anxiety    Patient had bad reaction requiring ativan  in addition to benadryl . He doesn't want to receive it any more    Current Outpatient Medications on File Prior to Visit  Medication Sig Dispense Refill   Blood Glucose Monitoring Suppl (ONE TOUCH ULTRA 2) w/Device KIT Use the device daily to check your blood sugars 1-4 times as instructed. 1 kit 0   dapagliflozin propanediol (FARXIGA) 5 MG TABS tablet Take 5 mg by mouth daily.     diltiazem  (CARDIZEM  CD) 120 MG 24 hr capsule TAKE 1 CAPSULE BY MOUTH EVERY DAY 90 capsule 3   Insulin  Pen Needle 29G X MISC Use with insulin  pen 100 each 1   Lancets (ONETOUCH ULTRASOFT) lancets Use to check blood sugar BID 100 each 12   Multiple Vitamins-Minerals (CENTRUM SILVER PO) Take 1 tablet by mouth daily.     naproxen sodium (ALEVE) 220 MG tablet Take 220 mg by mouth daily as needed (pain/headache).     ONETOUCH VERIO test strip USE TO TEST BLOOD SUGAR TWICE DAILY 100 strip 5   rosuvastatin  (CRESTOR ) 5 MG tablet TAKE 1 TABLET BY MOUTH EVERYDAY AT BEDTIME 90 tablet 3   Semaglutide ,0.25 or 0.5MG /DOS, (OZEMPIC , 0.25 OR 0.5 MG/DOSE,) 2 MG/1.5ML SOPN Inject 0.25 mg into the skin once a week.     vitamin B-12 (CYANOCOBALAMIN ) 1000 MCG tablet Take 1 tablet (1,000 mcg total) by mouth daily. 30 tablet 0   metFORMIN  (GLUCOPHAGE -XR) 500 MG 24 hr tablet TAKE 2 TABLETS (1,000 MG TOTAL) BY MOUTH 2 (TWO) TIMES DAILY WITH A MEAL. 360 tablet 1   No current facility-administered medications on file prior to visit.    BP 136/84    Pulse 64   Temp 98.2 F (36.8 C) (Oral)   Ht 6\' 2"  (1.88 m)   Wt 233 lb (105.7 kg)   SpO2 97%   BMI 29.92 kg/m  Objective:   Physical Exam Vitals and nursing note reviewed.  Constitutional:      Appearance: Normal appearance.  Cardiovascular:     Rate and Rhythm: Normal rate and regular rhythm.     Pulses: Normal pulses.     Heart sounds: Normal heart sounds.  Pulmonary:     Effort: Pulmonary effort is normal.     Breath sounds: Normal breath sounds.  Musculoskeletal:        General: Normal range of motion.  Skin:    General: Skin is warm and dry.  Neurological:     General: No focal deficit present.     Mental Status: He is alert and oriented to person, place, and time.  Psychiatric:        Mood and Affect: Mood normal.        Behavior: Behavior normal.        Thought Content: Thought content normal.        Judgment: Judgment normal.           Assessment & Plan:  1. Diabetes mellitus treated with oral medication (HCC) (Primary)  - POC HgB A1c- 6.9  - Continue with metformin  and Farxiga  - Follow up in 3 months   2. Long-term current use of injectable noninsulin antidiabetic medication - Will increase Ozempic  to 2 mg   3. Essential hypertension - At goal. No change in medication   Alto Atta, NP

## 2024-05-22 ENCOUNTER — Telehealth: Payer: Self-pay

## 2024-05-22 NOTE — Progress Notes (Signed)
   05/22/2024  Patient ID: Jason [name], male   DOB: August 19, 1956, 68 y.o.   MRN: [id number]  Received fax request from [facility] requesting refills on patient's Ozempic  2mg . Sent in renewal rx via fax for refills for remainder of year.  Carnell Christian, PharmD Clinical Pharmacist [phone]

## 2024-07-02 NOTE — Progress Notes (Signed)
 [facility] Quality Team Note  Name: DELOYD HANDY Date of Birth: 1956/11/21 MRN: [id number] Date: 07/02/2024  [facility] [place] Quality Team has reviewed this patient's chart, please see recommendations below:  [facility] Quality Other; (Chart reviewed for KED measure. Patient has appt 08/14/2024. Outreach to provider office)

## 2024-07-08 ENCOUNTER — Telehealth: Payer: Self-pay

## 2024-07-08 NOTE — Telephone Encounter (Signed)
 Spoke with pt advised to pick up Ozempic  from the office, pt Ozempic  is at the refrigerator located close to Cory's office

## 2024-07-17 ENCOUNTER — Telehealth: Payer: Self-pay

## 2024-07-17 MED ORDER — DAPAGLIFLOZIN PROPANEDIOL 5 MG PO TABS: 5.0000 mg | ORAL_TABLET | Freq: Every day | ORAL | 1 refills | Status: DC

## 2024-07-17 NOTE — Progress Notes (Signed)
   07/17/2024  Patient ID: Jason [name], male   DOB: [birth year], 68 y.o.   MRN: [id number]  Received request from patient assistance pharmacy for a new rx for Farxiga  (through [employer]). Requesting 90ds rx with 1 refill to complete the 2025 approval year. Sending rx.  [name], PharmD Clinical Pharmacist [phone]

## 2024-08-14 ENCOUNTER — Ambulatory Visit: Admitting: Adult Health

## 2024-08-14 ENCOUNTER — Encounter: Payer: Self-pay | Admitting: Adult Health

## 2024-08-14 VITALS — BP 136/84 | HR 77 | Temp 98.3°F | Ht 74.0 in | Wt 231.0 lb

## 2024-08-14 DIAGNOSIS — I10 Essential (primary) hypertension: Secondary | ICD-10-CM | POA: Diagnosis not present

## 2024-08-14 DIAGNOSIS — Z7985 Long-term (current) use of injectable non-insulin antidiabetic drugs: Secondary | ICD-10-CM | POA: Diagnosis not present

## 2024-08-14 DIAGNOSIS — Z7984 Long term (current) use of oral hypoglycemic drugs: Secondary | ICD-10-CM

## 2024-08-14 DIAGNOSIS — E119 Type 2 diabetes mellitus without complications: Secondary | ICD-10-CM

## 2024-08-14 DIAGNOSIS — E1169 Type 2 diabetes mellitus with other specified complication: Secondary | ICD-10-CM

## 2024-08-14 LAB — POCT GLYCOSYLATED HEMOGLOBIN (HGB A1C): Hemoglobin A1C: 6.5 % — AB (ref 4.0–5.6)

## 2024-08-14 MED ORDER — DILTIAZEM HCL ER COATED BEADS 120 MG PO CP24: ORAL_CAPSULE | ORAL | 0 refills | Status: DC

## 2024-08-14 MED ORDER — METFORMIN HCL ER 500 MG PO TB24: 1000.0000 mg | ORAL_TABLET | Freq: Two times a day (BID) | ORAL | 1 refills | Status: AC

## 2024-08-14 NOTE — Progress Notes (Signed)
 Subjective:    Patient ID: Jason [name], male    DOB: [birth year], 68 y.o.   MRN: [id number]  HPI 68 year old male who  has a past medical history of Arthritis, Bell palsy, Diabetes mellitus without complication (HCC), HTN (hypertension), and Hyperlipidemia.  He presents to the office today for follow up regarding DM and HTN   Diabetes mellitus type II-managed with metformin  1000 mg twice daily, Farxiga  5 mg daily ( through patient assistance) and Ozempic  2  mg weekly ( through patient assistance),  He does check his blood sugars periodically at home with readings in the 100-120  range. He has cut back on sugars and is trying to cut back on carbs. He is exercising.  Lab Results  Component Value Date   HGBA1C 6.5 (A) 08/14/2024   HGBA1C 6.9 (A) 05/07/2024   HGBA1C 8.4 (A) 02/06/2024   Wt Readings from Last 3 Encounters:  08/14/24 231 lb (104.8 kg)  05/07/24 233 lb (105.7 kg)  02/06/24 241 lb (109.3 kg)   HTN -managed with Cardizem  120 mg daily.  He does not monitor his blood pressure at home.  Denies dizziness, lightheadedness, chest pain, or shortness of breath.  BP Readings from Last 3 Encounters:  08/14/24 136/84  05/07/24 136/84  02/06/24 136/78     Review of Systems See HPI   Past Medical History:  Diagnosis Date   Arthritis    Bell palsy    Diabetes mellitus without complication (HCC)    HTN (hypertension)    Hyperlipidemia     Social History   Socioeconomic History   Marital status: Widowed    Spouse name: Not on file   Number of children: 2   Years of education: 12   Highest education level: Not on file  Occupational History   Occupation: transportation    Comment: Advertising account planner  Tobacco Use   Smoking status: Never   Smokeless tobacco: Never  Vaping Use   Vaping status: Never Used  Substance and Sexual Activity   Alcohol use: Yes    Alcohol/week: 6.0 standard drinks of alcohol    Types: 6 Cans of beer per week    Comment: on  weekends   Drug use: Never   Sexual activity: Not on file  Other Topics Concern   Not on file  Social History Narrative      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       Fun: fix cars, drag race    Social Drivers of Corporate investment banker Strain: Low Risk  (09/13/2023)   Overall Financial Resource Strain (CARDIA)    Difficulty of Paying Living Expenses: Not hard at all  Food Insecurity: No Food Insecurity (09/13/2023)   Hunger Vital Sign    Worried About Running Out of Food in the Last Year: Never true    Ran Out of Food in the Last Year: Never true  Transportation Needs: No Transportation Needs (09/13/2023)   PRAPARE - Administrator, Civil Service (Medical): No    Lack of Transportation (Non-Medical): No  Physical Activity: Insufficiently Active (09/13/2023)   Exercise Vital Sign    Days of Exercise per Week: 3 days    Minutes of Exercise per Session: 20 min  Stress: No Stress Concern Present (09/13/2023)   [employer] of Occupational Health - Occupational Stress Questionnaire    Feeling of Stress : Not at all  Social Connections:  Moderately Integrated (09/13/2023)   Social Connection and Isolation Panel    Frequency of Communication with Friends and Family: More than three times a week    Frequency of Social Gatherings with Friends and Family: More than three times a week    Attends Religious Services: More than 4 times per year    Active Member of [employer] or Organizations: Yes    Attends Banker Meetings: More than 4 times per year    Marital Status: Widowed  Intimate Partner Violence: Not At Risk (09/13/2023)   Humiliation, Afraid, Rape, and Kick questionnaire    Fear of Current or Ex-Partner: No    Emotionally Abused: No    Physically Abused: No    Sexually Abused: No    Past Surgical History:  Procedure Laterality Date   COLONOSCOPY     COLONOSCOPY W/ POLYPECTOMY      Family History  Problem Relation  Age of Onset   Stroke Mother    Colon cancer Sister    High blood pressure Sister    Colon cancer Brother    High blood pressure Brother    Stomach cancer Brother    Lymphoma Son    Colon cancer Other        1st degree relative <60   Diabetes Other        1st degree relative    Stroke Other    Esophageal cancer Neg Hx    Rectal cancer Neg Hx     Allergies  Allergen Reactions   Lisinopril     hyperkalemia   Reglan  [Metoclopramide ] Anxiety    Patient had bad reaction requiring ativan  in addition to benadryl . He doesn't want to receive it any more    Current Outpatient Medications on File Prior to Visit  Medication Sig Dispense Refill   Blood Glucose Monitoring Suppl (ONE TOUCH ULTRA 2) w/Device KIT Use the device daily to check your blood sugars 1-4 times as instructed. 1 kit 0   dapagliflozin  propanediol (FARXIGA ) 5 MG TABS tablet Take 1 tablet (5 mg total) by mouth daily. 90 tablet 1   Insulin  Pen Needle 29G X MISC Use with insulin  pen 100 each 1   Lancets (ONETOUCH ULTRASOFT) lancets Use to check blood sugar BID 100 each 12   Multiple Vitamins-Minerals (CENTRUM SILVER PO) Take 1 tablet by mouth daily.     naproxen sodium (ALEVE) 220 MG tablet Take 220 mg by mouth daily as needed (pain/headache).     ONETOUCH VERIO test strip USE TO TEST BLOOD SUGAR TWICE DAILY 100 strip 5   rosuvastatin  (CRESTOR ) 5 MG tablet TAKE 1 TABLET BY MOUTH EVERYDAY AT BEDTIME 90 tablet 3   Semaglutide , 2 MG/DOSE, (OZEMPIC , 2 MG/DOSE,) 8 MG/3ML SOPN Inject 2 mg into the skin once a week.     vitamin B-12 (CYANOCOBALAMIN ) 1000 MCG tablet Take 1 tablet (1,000 mcg total) by mouth daily. 30 tablet 0   No current facility-administered medications on file prior to visit.    BP 136/84   Pulse 77   Temp 98.3 F (36.8 C) (Oral)   Ht 6' 2 (1.88 m)   Wt 231 lb (104.8 kg)   SpO2 99%   BMI 29.66 kg/m       Objective:   Physical Exam Vitals and nursing note reviewed.  Constitutional:       Appearance: Normal appearance. He is obese.  Cardiovascular:     Rate and Rhythm: Normal rate and regular rhythm.  Pulses: Normal pulses.     Heart sounds: Normal heart sounds.  Pulmonary:     Effort: Pulmonary effort is normal.     Breath sounds: Normal breath sounds.  Skin:    General: Skin is warm and dry.  Neurological:     General: No focal deficit present.     Mental Status: He is alert and oriented to person, place, and time.  Psychiatric:        Mood and Affect: Mood normal.        Behavior: Behavior normal.        Thought Content: Thought content normal.        Judgment: Judgment normal.        Assessment & Plan:  1. Diabetes mellitus treated with oral medication (HCC) (Primary) - POC HgB A1c- 6.5  - A1c has improved. Continue with Metformin  and Farxiga .  - Follow up in 3 months at CPE  - metFORMIN  (GLUCOPHAGE -XR) 500 MG 24 hr tablet; Take 2 tablets (1,000 mg total) by mouth 2 (two) times daily with a meal.  Dispense: 360 tablet; Refill: 1  2. Long-term current use of injectable noninsulin antidiabetic medication - Continue with Ozempic  2 mg weekly   3. Essential hypertension - Controlled. Continue with current medication  - diltiazem  (CARDIZEM  CD) 120 MG 24 hr capsule; TAKE 1 CAPSULE BY MOUTH EVERY DAY  Dispense: 90 capsule; Refill: 0   Darleene Shape, NP

## 2024-08-14 NOTE — Patient Instructions (Addendum)
 It was great seeing you today   Your A1c was 6.5   Dr. Vernetta Address: [address], [place], [place] [address] Phone: [phone]

## 2024-09-09 NOTE — Progress Notes (Signed)
 Jason [name]                                          MRN: [id number]   09/09/2024   The [facility] Quality Team Specialist reviewed this patient medical record for the purposes of chart review for care gap closure. The following were reviewed: chart review for care gap closure-kidney health evaluation for diabetes:eGFR  and uACR. Patient had visit for Diabetes 08/14/2024. No KED labs drawn. AWV scheduled for 09/16/2024    [facility] Quality Team

## 2024-09-10 ENCOUNTER — Telehealth: Payer: Self-pay

## 2024-09-10 NOTE — Progress Notes (Signed)
   09/10/2024  Patient ID: Jason [name], male   DOB: 1955/12/27, 68 y.o.   MRN: [id number]  Pharmacy Quality Measure Review  Statin Use in Persons with Diabetes (SUPD)  Patient is appearing on report for being at risk to fail the SUPD metric for 2025.   Patient is suppose to be taking crestor  5mg  per medication list. Contacted patient. He reports he has been taking from old bottles from excess on hand. Discussed with patient these may be expired. Patient would like a new refill and to dispose of old pills, will coordinate refill with PCP as patient needs updated rx.  [name], PharmD Clinical Pharmacist [phone]

## 2024-09-11 ENCOUNTER — Telehealth: Payer: Self-pay

## 2024-09-11 DIAGNOSIS — E782 Mixed hyperlipidemia: Secondary | ICD-10-CM

## 2024-09-11 MED ORDER — ROSUVASTATIN CALCIUM 5 MG PO TABS: ORAL_TABLET | ORAL | 1 refills | Status: AC

## 2024-09-11 NOTE — Telephone Encounter (Signed)
-----   Message from [name] sent at 09/10/2024  3:16 PM EDT ----- Hello,  Pharmacy needs new prescription for patient for the following:  Rosuvastatin  5mg  TAKE 1 TABLET BY MOUTH EVERYDAY AT BEDTIME   Pharmacy:  [id number] - [place], [place] - [address] (Ph: [phone])  Thank you! [name], PharmD Clinical Pharmacist [phone]

## 2024-09-24 ENCOUNTER — Telehealth: Payer: Self-pay

## 2024-09-24 NOTE — Progress Notes (Signed)
   09/24/2024  Patient ID: Jason [name], male   DOB: [birth year], 68 y.o.   MRN: [id number]  Received fax notification from [employer] that patient has been conditionally approved to receive Farxiga  for 2026. In order to get full approval for 2026 and continue to receive product, patient needs to provide consent that they acknowledge the terms and conditions of the program.   This can be done in the following ways: 1) Patient can go to [email or web address] to provide consent via digital assistant 2) Call [phone] to provide verbal consent 3) Complete the paper application   [employer] Patient ID: [id number]   Forwarding to CPhT to assist with enrollment process for patient.   [name], PharmD Clinical Pharmacist [phone]

## 2024-09-24 NOTE — Telephone Encounter (Signed)
 Gave pt a call pt has been pre-approved for 2026 pt can call to give consent to re-enrollment ,spoke with pt provide with [employer] number and will be calling to give consent to re-enrollment.

## 2024-09-25 ENCOUNTER — Telehealth: Payer: Self-pay

## 2024-09-25 NOTE — Progress Notes (Signed)
 Jason [name]                                          MRN: [id number]   09/25/2024   The [facility] Quality Team Specialist reviewed this patient medical record for the purposes of chart review for care gap closure. The following were reviewed: chart review for care gap closure-kidney health evaluation for diabetes:eGFR  and uACR.    [facility] Quality Team

## 2024-09-25 NOTE — Progress Notes (Signed)
   09/25/2024  Patient ID: Jason [name], male   DOB: November 21, 1956, 68 y.o.   MRN: [id number]  Received fax request from [facility] for new rx order for patient's Ozempic  2mg .  Sent in written order for refill. Sent in for 4 months; however, highly expect company to only send 2 boxes to cover rest of 2025 as program is ending for 2026.  [name], PharmD Clinical Pharmacist [phone]

## 2024-10-02 ENCOUNTER — Ambulatory Visit: Admitting: Orthopaedic Surgery

## 2024-10-02 ENCOUNTER — Encounter: Payer: Self-pay | Admitting: Orthopaedic Surgery

## 2024-10-02 ENCOUNTER — Other Ambulatory Visit (INDEPENDENT_AMBULATORY_CARE_PROVIDER_SITE_OTHER)

## 2024-10-02 VITALS — Ht 73.23 in | Wt 229.0 lb

## 2024-10-02 DIAGNOSIS — M25551 Pain in right hip: Secondary | ICD-10-CM | POA: Diagnosis not present

## 2024-10-02 DIAGNOSIS — M1611 Unilateral primary osteoarthritis, right hip: Secondary | ICD-10-CM

## 2024-10-02 NOTE — Progress Notes (Signed)
 The patient is a 68 year old gentleman that we last saw about 2 years ago due to right hip pain and osteoarthritis.  At the time he was feeling better overall and had worked on getting his blood glucose under control and decided he would hold off on any type of surgical intervention.  He says now the pain has gotten significantly worse over the last several months with his right hip and is popping and walking when he first gets up it is harder.  He has pain that wakes him up at night as well.  He started to walk with a significant limp.  He has had 1 steroid injection in that hip as well.  He is still thin.  He is still under good blood glucose control as well.  His BMI is 30.  On exam his right hip is now become significantly stiff with internal and external rotation with severe pain in the groin with rotation.  His left hip moves smoothly and fluidly.  An AP pelvis and lateral of the right hip today shows complete loss of the joint space at this point.  There is cystic changes and sclerotic changes.  This is significantly and really 100% worse than what it was over 2 years ago.  At this point his hip pain is daily and is definitely affecting his mobility, his quality of life and his actives daily living to the point he is ready to proceed with hip replacement surgery.  We did talk about what the surgery involves and discussed the risks and benefits of surgery.  I gave him another hand about hip replacement surgery and showed an hip replacement model.  At this point we we will work on scheduling him for a right total hip arthroplasty given the severity of his bone-on-bone arthritis and how this is affecting his life.

## 2024-10-04 ENCOUNTER — Telehealth: Payer: Self-pay

## 2024-10-04 NOTE — Telephone Encounter (Signed)
 Left detailed message advising pt the his pt assistant Rx is ready for pickup.

## 2024-10-14 ENCOUNTER — Encounter: Payer: Self-pay | Admitting: Radiology

## 2024-10-15 ENCOUNTER — Other Ambulatory Visit: Payer: Self-pay | Admitting: Physician Assistant

## 2024-10-15 DIAGNOSIS — Z01818 Encounter for other preprocedural examination: Secondary | ICD-10-CM

## 2024-10-16 ENCOUNTER — Other Ambulatory Visit

## 2024-10-16 DIAGNOSIS — E119 Type 2 diabetes mellitus without complications: Secondary | ICD-10-CM

## 2024-10-16 NOTE — Progress Notes (Signed)
   10/16/2024 Name: Jason [name] MRN: [id number] DOB: 12/08/1956  Chief Complaint  Patient presents with   Medication Management   Diabetes   Medication Assistance    Jason [name] is a 68 y.o. year old male who presented for a telephone visit.   They were referred to the pharmacist by their PCP for assistance in managing diabetes, medication access, and complex medication management.    Subjective:  Care Team: Primary Care Provider: Merna Huxley, NP ; Next Scheduled Visit: 11/13/24  Medication Access/Adherence  Current Pharmacy:  [id number] [name], [place] - [name], [name]   Patient reports affordability concerns with their medications: No Patient reports access/transportation concerns to their pharmacy: No  Patient reports adherence concerns with their medications:  No     Diabetes:  Current medications:  Farxiga  5mg , Ozempic  2mg  Medications tried in the past: Trulicity  (stopped due to cost), Lantus  (was just being used until patient could increase ozempic )  Current glucose readings: Reports sugars within range   Observed patterns:  Patient denies hypoglycemic s/sx including dizziness, shakiness, sweating. Patient denies hyperglycemic symptoms including polyuria, polydipsia, polyphagia, nocturia, neuropathy, blurred vision.   Current medication access support: Farxiga  5mg  ([employer]), Ozempic  ([facility])     Objective:  Lab Results  Component Value Date   HGBA1C 6.5 (A) 08/14/2024    Lab Results  Component Value Date   CREATININE 1.25 11/03/2023   BUN 21 11/03/2023   NA 140 11/03/2023   K 5.3 No hemolysis seen (H) 11/03/2023   CL 104 11/03/2023   CO2 28 11/03/2023    Lab Results  Component Value Date   CHOL 142 11/03/2023    HDL 57.20 11/03/2023   LDLCALC 70 11/03/2023   TRIG 74.0 11/03/2023   CHOLHDL 2 11/03/2023    Medications Reviewed Today   Medications were not reviewed in this encounter       Assessment/Plan:   Diabetes: - Currently uncontrolled - Reviewed long term cardiovascular and renal outcomes of uncontrolled blood sugar - Reviewed goal A1c, goal fasting, and goal 2 hour post prandial glucose - Reviewed dietary modifications including low carb diet - Patient denies personal or family history of multiple endocrine neoplasia type 2, medullary thyroid  cancer; personal history of pancreatitis or gallbladder disease. - Recommend to check glucose once daily at varying times -Continue current med therapy. Will have CPhT follow up with [facility] to find out why they only shipped patient one box instead of enough to finish out through end of 2025. -Scheduled f/u for early Jan to assess Ozempic  affordability and to see if alternative will be needed -Reminded patient to call [employer] to confirm Farxiga  renewal for 2026.      Follow Up Plan: 2 motnhs  Jason [name], PharmD Clinical Pharmacist [phone]

## 2024-10-17 ENCOUNTER — Telehealth: Payer: Self-pay

## 2024-10-17 NOTE — Telephone Encounter (Signed)
 Gave Novo Nordisk a call pt only received 1 box of Ozempic  2mg  it's not enough till the end of year,spoke with representative said they made a mistake not sending the 3 month supply ,was able to put and order of 2 more boxes will arrived at provider office with in 10-14 day.

## 2024-10-18 ENCOUNTER — Telehealth: Payer: Self-pay

## 2024-10-18 MED ORDER — DAPAGLIFLOZIN PROPANEDIOL 5 MG PO TABS: 5.0000 mg | ORAL_TABLET | Freq: Every day | ORAL | 0 refills | Status: AC

## 2024-10-18 NOTE — Telephone Encounter (Signed)
-----   Message from [name] sent at 10/14/2024 10:18 AM EST ----- Hello,  Pharmacy is requesting refill on the following medication:  Farixga 5mg  1 tablet by mouth daily (gets through patient assistance pharmacy)  Pharmacy Info: [facility] - [place], [place] - [address]: [phone] F: [phone]  Thank you! [name], PharmD Clinical Pharmacist [phone]

## 2024-10-18 NOTE — Telephone Encounter (Signed)
 Rx refilled.

## 2024-10-21 NOTE — Progress Notes (Signed)
 Surgical Instructions   Your procedure is scheduled on Tuesday October 29, 2024. Report to [facility] Main Entrance A at 6:45 A.M., then check in with the Admitting office. Any questions or running late day of surgery: call [phone]  Questions prior to your surgery date: call [phone], Monday-Friday, 8am-4pm. If you experience any cold or flu symptoms such as cough, fever, chills, shortness of breath, etc. between now and your scheduled surgery, please notify us  at the above number.     Remember:  Do not eat after midnight the night before your surgery  You may drink clear liquids until 5:45 the morning of your surgery.   Clear liquids allowed are: Water, Non-Citrus Juices (without pulp), Carbonated Beverages, Clear Tea (no milk, honey, etc.), Black Coffee Only (NO MILK, CREAM OR POWDERED CREAMER of any kind), and Gatorade.  Patient Instructions  The night before surgery:  No food after midnight. ONLY clear liquids after midnight  The day of surgery (if you have diabetes): Drink ONE (1) 12 oz G2 given to you in your pre admission testing appointment by 5:45 the morning of surgery. Drink in one sitting. Do not sip.  This drink was given to you during your hospital  pre-op appointment visit.  Nothing else to drink after completing the  12 oz bottle of G2.         If you have questions, please contact your surgeon's office.     Take these medicines the morning of surgery with A SIP OF WATER  diltiazem  (CARDIZEM  CD)   One week prior to surgery, STOP taking any Aspirin (unless otherwise instructed by your surgeon) Aleve, Naproxen, Ibuprofen, Motrin, Advil, Goody's, BC's, all herbal medications, fish oil, and non-prescription vitamins.  WHAT DO I DO ABOUT MY DIABETES MEDICATION?   Do not take oral diabetes medicines (pills) the morning of surgery.        DO NOT TAKE YOUR dapagliflozin  propanediol (FARXIGA ) 72 HOURS PRIOR TO SURGERY WITH THE LAST DOSE BEING  10/25/2024.        DO NOT TAKE YOUR metFORMIN  (GLUCOPHAGE -XR) THE MORNING OF SURGERY.        DO NOT TAKE YOUR Semaglutide , 2 MG/DOSE, (OZEMPIC , 2 MG/DOSE,) 7 DAYS PRIOR TO SURGERY WITH THE LAST DOSE BEING NO LATER THAN 10/21/2024.       The day of surgery, do not take other diabetes injectables, including Byetta (exenatide), Bydureon (exenatide ER), Victoza (liraglutide), or Trulicity  (dulaglutide ).  If your CBG is greater than 220 mg/dL, you may take  of your sliding scale (correction) dose of insulin .   HOW TO MANAGE YOUR DIABETES BEFORE AND AFTER SURGERY  Why is it important to control my blood sugar before and after surgery? Improving blood sugar levels before and after surgery helps healing and can limit problems. A way of improving blood sugar control is eating a healthy diet by:  Eating less sugar and carbohydrates  Increasing activity/exercise  Talking with your doctor about reaching your blood sugar goals High blood sugars (greater than 180 mg/dL) can raise your risk of infections and slow your recovery, so you will need to focus on controlling your diabetes during the weeks before surgery. Make sure that the doctor who takes care of your diabetes knows about your planned surgery including the date and location.  How do I manage my blood sugar before surgery? Check your blood sugar at least 4 times a day, starting 2 days before surgery, to make sure that the level is not too high or  low.  Check your blood sugar the morning of your surgery when you wake up and every 2 hours until you get to the Short Stay unit.  If your blood sugar is less than 70 mg/dL, you will need to treat for low blood sugar: Do not take insulin . Treat a low blood sugar (less than 70 mg/dL) with  cup of clear juice (cranberry or apple), 4 glucose tablets, OR glucose gel. Recheck blood sugar in 15 minutes after treatment (to make sure it is greater than 70 mg/dL). If your blood sugar is not greater than  70 mg/dL on recheck, call [phone] for further instructions. Report your blood sugar to the short stay nurse when you get to Short Stay.  If you are admitted to the hospital after surgery: Your blood sugar will be checked by the staff and you will probably be given insulin  after surgery (instead of oral diabetes medicines) to make sure you have good blood sugar levels. The goal for blood sugar control after surgery is 80-180 mg/dL.                      Do NOT Smoke (Tobacco/Vaping) for 24 hours prior to your procedure.  If you use a CPAP at night, you may bring your mask/headgear for your overnight stay.   You will be asked to remove any contacts, glasses, piercing's, hearing aid's, dentures/partials prior to surgery. Please bring cases for these items if needed.    Patients discharged the day of surgery will not be allowed to drive home, and someone needs to stay with them for 24 hours.  SURGICAL WAITING ROOM VISITATION Patients may have no more than 2 support people in the waiting area - these visitors may rotate.   Pre-op nurse will coordinate an appropriate time for 1 ADULT support person, who may not rotate, to accompany patient in pre-op.  Children under the age of 48 must have an adult with them who is not the patient and must remain in the main waiting area with an adult.  If the patient needs to stay at the hospital during part of their recovery, the visitor guidelines for inpatient rooms apply.  Please refer to the [facility] website for the visitor guidelines for any additional information.   If you received a COVID test during your pre-op visit  it is requested that you wear a mask when out in public, stay away from anyone that may not be feeling well and notify your surgeon if you develop symptoms. If you have been in contact with anyone that has tested positive in the last 10 days please notify you surgeon.      Pre-operative 4 CHG Bathing Instructions   You can  play a key role in reducing the risk of infection after surgery. Your skin needs to be as free of germs as possible. You can reduce the number of germs on your skin by washing with CHG (chlorhexidine gluconate) soap before surgery. CHG is an antiseptic soap that kills germs and continues to kill germs even after washing.   DO NOT use if you have an allergy to chlorhexidine/CHG or antibacterial soaps. If your skin becomes reddened or irritated, stop using the CHG and notify one of our RNs at [phone].   Please shower with the CHG soap starting 4 days before surgery using the following schedule:     Please keep in mind the following:  You may shave your face at any point before/day of  surgery.  Place clean sheets on your bed the day you start using CHG soap. Use a clean washcloth (not used since being washed) for each shower. DO NOT sleep with pets once you start using the CHG.   CHG Shower Instructions:  Wash your face and private area with normal soap. If you choose to wash your hair, wash first with your normal shampoo.  After you use shampoo/soap, rinse your hair and body thoroughly to remove shampoo/soap residue.  Turn the water OFF and apply  bottle of CHG soap to a CLEAN washcloth.  Apply CHG soap ONLY FROM YOUR NECK DOWN TO YOUR TOES (washing for 3-5 minutes)  DO NOT use CHG soap on face, private areas, open wounds, or sores.  Pay special attention to the area where your surgery is being performed.  If you are having back surgery, having someone wash your back for you may be helpful. Wait 2 minutes after CHG soap is applied, then you may rinse off the CHG soap.  Pat dry with a clean towel  Put on clean clothes/pajamas   If you choose to wear lotion, please use ONLY the CHG-compatible lotions that are listed below.  Additional instructions for the day of surgery:  If you choose, you may shower the morning of surgery with an antibacterial soap.  DO NOT APPLY any lotions,  deodorants or cologne.   Do not bring valuables to the hospital. [facility] is not responsible for any belongings/valuables. Do not wear nail polish, gel polish, artificial nails, or any other type of covering on natural nails (fingers and toes) Do not wear jewelry Put on clean/comfortable clothes.  Please brush your teeth.  Ask your nurse before applying any prescription medications to the skin.     CHG Compatible Lotions   Aveeno Moisturizing lotion  Cetaphil Moisturizing Cream  Cetaphil Moisturizing Lotion  Clairol Herbal Essence Moisturizing Lotion, Dry Skin  Clairol Herbal Essence Moisturizing Lotion, Extra Dry Skin  Clairol Herbal Essence Moisturizing Lotion, Normal Skin  Curel Age Defying Therapeutic Moisturizing Lotion with Alpha Hydroxy  Curel Extreme Care Body Lotion  Curel Soothing Hands Moisturizing Hand Lotion  Curel Therapeutic Moisturizing Cream, Fragrance-Free  Curel Therapeutic Moisturizing Lotion, Fragrance-Free  Curel Therapeutic Moisturizing Lotion, Original Formula  Eucerin Daily Replenishing Lotion  Eucerin Dry Skin Therapy Plus Alpha Hydroxy Crme  Eucerin Dry Skin Therapy Plus Alpha Hydroxy Lotion  Eucerin Original Crme  Eucerin Original Lotion  Eucerin Plus Crme Eucerin Plus Lotion  Eucerin TriLipid Replenishing Lotion  Keri Anti-Bacterial Hand Lotion  Keri Deep Conditioning Original Lotion Dry Skin Formula Softly Scented  Keri Deep Conditioning Original Lotion, Fragrance Free Sensitive Skin Formula  Keri Lotion Fast Absorbing Fragrance Free Sensitive Skin Formula  Keri Lotion Fast Absorbing Softly Scented Dry Skin Formula  Keri Original Lotion  Keri Skin Renewal Lotion Keri Silky Smooth Lotion  Keri Silky Smooth Sensitive Skin Lotion  Nivea Body Creamy Conditioning Oil  Nivea Body Extra Enriched Lotion  Nivea Body Original Lotion  Nivea Body Sheer Moisturizing Lotion Nivea Crme  Nivea Skin Firming Lotion  NutraDerm 30 Skin Lotion  NutraDerm  Skin Lotion  NutraDerm Therapeutic Skin Cream  NutraDerm Therapeutic Skin Lotion  ProShield Protective Hand Cream  Provon moisturizing lotion  Please read over the following fact sheets that you were given.

## 2024-10-22 ENCOUNTER — Encounter (HOSPITAL_COMMUNITY): Payer: Self-pay

## 2024-10-22 ENCOUNTER — Encounter (HOSPITAL_COMMUNITY)
Admission: RE | Admit: 2024-10-22 | Discharge: 2024-10-22 | Disposition: A | Discharge status: Home / Self Care | Source: Ambulatory Visit | Attending: Orthopaedic Surgery | Admitting: Orthopaedic Surgery

## 2024-10-22 ENCOUNTER — Other Ambulatory Visit: Payer: Self-pay

## 2024-10-22 VITALS — BP 163/97 | HR 88 | Temp 98.5°F | Resp 18 | Ht 75.0 in | Wt 233.0 lb

## 2024-10-22 DIAGNOSIS — I452 Bifascicular block: Secondary | ICD-10-CM | POA: Insufficient documentation

## 2024-10-22 DIAGNOSIS — Z01818 Encounter for other preprocedural examination: Secondary | ICD-10-CM | POA: Insufficient documentation

## 2024-10-22 DIAGNOSIS — E1169 Type 2 diabetes mellitus with other specified complication: Secondary | ICD-10-CM | POA: Diagnosis not present

## 2024-10-22 LAB — CBC
HCT: 49.4 % (ref 39.0–52.0)
Hemoglobin: 16.3 g/dL (ref 13.0–17.0)
MCH: 32.2 pg (ref 26.0–34.0)
MCHC: 33 g/dL (ref 30.0–36.0)
MCV: 97.6 fL (ref 80.0–100.0)
Platelets: 217 K/uL (ref 150–400)
RBC: 5.06 MIL/uL (ref 4.22–5.81)
RDW: 13.2 % (ref 11.5–15.5)
WBC: 7 K/uL (ref 4.0–10.5)
nRBC: 0 % (ref 0.0–0.2)

## 2024-10-22 LAB — BASIC METABOLIC PANEL WITH GFR
Anion gap: 12 (ref 5–15)
BUN: 19 mg/dL (ref 8–23)
CO2: 23 mmol/L (ref 22–32)
Calcium: 9.8 mg/dL (ref 8.9–10.3)
Chloride: 105 mmol/L (ref 98–111)
Creatinine, Ser: 1.18 mg/dL (ref 0.61–1.24)
GFR, Estimated: >60 mL/min (ref >60)
Glucose, Bld: 122 mg/dL — ABNORMAL HIGH (ref 70–99)
Potassium: 4.7 mmol/L (ref 3.5–5.1)
Sodium: 140 mmol/L (ref 135–145)

## 2024-10-22 LAB — TYPE AND SCREEN
ABO/RH(D): O POS
Antibody Screen: NEGATIVE

## 2024-10-22 LAB — GLUCOSE, CAPILLARY: Glucose-Capillary: 128 mg/dL — ABNORMAL HIGH (ref 70–99)

## 2024-10-22 LAB — SURGICAL PCR SCREEN
MRSA, PCR: NEGATIVE
Staphylococcus aureus: NEGATIVE

## 2024-10-22 NOTE — Progress Notes (Signed)
 PCP - Darleene Shape, NP Cardiologist -   PPM/ICD - denies Device Orders - na Rep Notified - na  Chest x-ray - na EKG - PAT, 10/22/2024 Stress Test -  ECHO - 2012 Cardiac Cath -   Sleep Study - had sleep study, no dx of sleep apnea CPAP - na  Type II diabetic. A1C 6.5 on 08/14/2024. Blood sugar 128 at PAT appointment Fasting Blood Sugar : 100-120 Checks Blood Sugar: daily Metformin , hold DOS Farxiga , hold 72 hours, last dose 10/25/2024  Last dose of GLP1 agonist-  Ozempic  GLP1 instructions: hold 7 days, states last dose 10/19/2024  Blood Thinner Instructions:denies Aspirin Instructions:denies  ERAS Protcol -G2 until 0545  Anesthesia review: No  Patient denies shortness of breath, fever, cough and chest pain at PAT appointment   All instructions explained to the patient, with a verbal understanding of the material. Patient agrees to go over the instructions while at home for a better understanding. Patient also instructed to self quarantine after being tested for COVID-19. The opportunity to ask questions was provided.

## 2024-10-25 NOTE — Care Plan (Signed)
 Ortho Bundle Case Management Note  Patient Details  Name: Jason [name] MRN: [id number] Date of Birth: 02-27-1956  [facility] RNCM call to patient and discussed his upcoming Right total hip arthroplasty with Dr. Vernetta at [facility] on 10/29/24. He is agreeable to case management. His plan is to return home with assistance from his daughter and significant other. He will need a RW (Height 6'3). Anticipate HHPT will be needed after a short hospital stay. Referral made to [facility] after choice provided. Reviewed post op care instructions and questions answered. Emails for risk assessment and pre-op survey as well as Post op care instructions emailed to patient for his review prior to surgery. Will continue to follow for needs.                  DME Arranged:  Vannie rolling DME Agency:  [employer]  HH Arranged:  PT HH Agency:  [facility]  Additional Comments: Please contact me with any questions of if this plan should need to change.  Tylene Ned, RN, BSN, [employer]  [phone] 10/25/2024, 10:26 AM

## 2024-10-28 DIAGNOSIS — M1611 Unilateral primary osteoarthritis, right hip: Principal | ICD-10-CM | POA: Insufficient documentation

## 2024-10-28 NOTE — H&P (Signed)
 TOTAL HIP ADMISSION H&P  Patient is admitted for right total hip arthroplasty.  Subjective:  Chief Complaint: right hip pain  HPI: Jason [name], 68 y.o. male, has a history of pain and functional disability in the right hip(s) due to arthritis and patient has failed non-surgical conservative treatments for greater than 12 weeks to include NSAID's and/or analgesics, corticosteriod injections, use of assistive devices, weight reduction as appropriate, and activity modification.  Onset of symptoms was gradual starting several years ago with gradually worsening course since that time.The patient noted no past surgery on the right hip(s).  Patient currently rates pain in the right hip at 10 out of 10 with activity. Patient has night pain, worsening of pain with activity and weight bearing, trendelenberg gait, pain that interfers with activities of daily living, and pain with passive range of motion. Patient has evidence of subchondral cysts, subchondral sclerosis, periarticular osteophytes, and joint space narrowing by imaging studies. This condition presents safety issues increasing the risk of falls.  There is no current active infection.  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   Tick bite 07/20/2021   AMS (altered mental status) 07/10/2021   Marijuana abuse 07/10/2021   Class 1 obesity due to excess calories with body mass index (BMI) of 30.0 to 30.9 in adult 07/10/2021   Routine general medical examination at a health care facility 02/02/2016   Type 2 diabetes mellitus (HCC) 06/12/2012   Syncope - neurally mediated 07/04/2011   Essential hypertension 12/24/2007   HYPERGLYCEMIA 12/24/2007   Past Medical History:  Diagnosis Date   Arthritis    Bell palsy     Diabetes mellitus without complication (HCC)    HTN (hypertension)    Hyperlipidemia     Past Surgical History:  Procedure Laterality Date   COLONOSCOPY     COLONOSCOPY W/ POLYPECTOMY      No current facility-administered medications for this encounter.   Current Outpatient Medications  Medication Sig Dispense Refill Last Dose/Taking   dapagliflozin  propanediol (FARXIGA ) 5 MG TABS tablet Take 1 tablet (5 mg total) by mouth daily. 90 tablet 0 Taking   diltiazem  (CARDIZEM  CD) 120 MG 24 hr capsule TAKE 1 CAPSULE BY MOUTH EVERY DAY 90 capsule 0 Taking   metFORMIN  (GLUCOPHAGE -XR) 500 MG 24 hr tablet Take 2 tablets (1,000 mg total) by mouth 2 (two) times daily with a meal. (Patient taking differently: Take 1,000 mg by mouth in the morning.) 360 tablet 1 Taking Differently   Multiple Vitamin (MULTIVITAMIN WITH MINERALS) TABS tablet Take 1 tablet by mouth in the morning.   Taking   naproxen sodium (ALEVE) 220 MG tablet Take 440 mg by mouth daily as needed (pain/headache).   Taking As Needed   rosuvastatin  (CRESTOR ) 5 MG tablet TAKE 1 TABLET BY MOUTH EVERYDAY AT BEDTIME 90 tablet 1 Taking   Semaglutide , 2 MG/DOSE, (OZEMPIC , 2 MG/DOSE,) 8 MG/3ML SOPN Inject 2 mg into the skin every Saturday.   Taking   vitamin B-12 (CYANOCOBALAMIN ) 1000 MCG tablet Take 1 tablet (1,000 mcg total) by mouth daily. 30 tablet 0 Taking   Blood Glucose Monitoring Suppl (ONE TOUCH ULTRA 2) w/Device KIT Use the device daily to check your blood sugars 1-4 times as instructed. 1 kit 0    Insulin  Pen Needle 29G X MISC  Use with insulin  pen 100 each 1    Lancets (ONETOUCH ULTRASOFT) lancets Use to check blood sugar BID 100 each 12    ONETOUCH VERIO test strip USE TO TEST BLOOD SUGAR TWICE DAILY 100 strip 5    Allergies  Allergen Reactions   Lisinopril     hyperkalemia   Reglan  [Metoclopramide ] Anxiety    Patient had bad reaction requiring ativan  in addition to benadryl . He doesn't want to receive it any more    Social  History   Tobacco Use   Smoking status: Never   Smokeless tobacco: Never  Substance Use Topics   Alcohol use: Yes    Alcohol/week: 6.0 standard drinks of alcohol    Types: 6 Cans of beer per week    Comment: occas    Family History  Problem Relation Age of Onset   Stroke Mother    Colon cancer Sister    High blood pressure Sister    Colon cancer Brother    High blood pressure Brother    Stomach cancer Brother    Lymphoma Son    Colon cancer Other        1st degree relative <60   Diabetes Other        1st degree relative    Stroke Other    Esophageal cancer Neg Hx    Rectal cancer Neg Hx      Review of Systems  Objective:  Physical Exam Vitals reviewed.  Constitutional:      Appearance: Normal appearance. He is normal weight.  HENT:     Head: Normocephalic and atraumatic.  Eyes:     Extraocular Movements: Extraocular movements intact.     Pupils: Pupils are equal, round, and reactive to light.  Cardiovascular:     Rate and Rhythm: Normal rate and regular rhythm.  Pulmonary:     Effort: Pulmonary effort is normal.     Breath sounds: Normal breath sounds.  Abdominal:     Palpations: Abdomen is soft.  Musculoskeletal:     Cervical back: Normal range of motion and neck supple.     Right hip: Tenderness and bony tenderness present. Decreased range of motion. Decreased strength.  Neurological:     Mental Status: He is alert and oriented to person, place, and time.  Psychiatric:        Behavior: Behavior normal.     Vital signs in last 24 hours:    Labs:   Estimated body mass index is 29.12 kg/m as calculated from the following:   Height as of 10/22/24: 6' 3 (1.905 m).   Weight as of 10/22/24: 105.7 kg.   Imaging Review Plain radiographs demonstrate severe degenerative joint disease of the right hip(s). The bone quality appears to be good for age and reported activity level.      Assessment/Plan:  End stage arthritis, right hip(s)  The  patient history, physical examination, clinical judgement of the provider and imaging studies are consistent with end stage degenerative joint disease of the right hip(s) and total hip arthroplasty is deemed medically necessary. The treatment options including medical management, injection therapy, arthroscopy and arthroplasty were discussed at length. The risks and benefits of total hip arthroplasty were presented and reviewed. The risks due to aseptic loosening, infection, stiffness, dislocation/subluxation,  thromboembolic complications and other imponderables were discussed.  The patient acknowledged the explanation, agreed to proceed with the plan and consent was signed. Patient is being admitted for inpatient treatment for surgery, pain control, PT, OT, prophylactic  antibiotics, VTE prophylaxis, progressive ambulation and ADL's and discharge planning.The patient is planning to be discharged home with home health services

## 2024-10-28 NOTE — Anesthesia Preprocedure Evaluation (Signed)
 Anesthesia Evaluation  Patient identified by MRN, date of birth, ID band Patient awake    Reviewed: Allergy & Precautions, NPO status , Patient's Chart, lab work & pertinent test results  Airway Mallampati: II  TM Distance: >3 FB Neck ROM: Full    Dental  (+) Dental Advisory Given, Missing   Pulmonary neg pulmonary ROS   Pulmonary exam normal breath sounds clear to auscultation       Cardiovascular hypertension, Pt. on medications Normal cardiovascular exam Rhythm:Regular Rate:Normal     Neuro/Psych  Neuromuscular disease    GI/Hepatic negative GI ROS,,,(+)     substance abuse  marijuana use  Endo/Other  diabetes, Type 2, Oral Hypoglycemic Agents    Renal/GU negative Renal ROS     Musculoskeletal  (+) Arthritis , Osteoarthritis,   osteoarthritis right hip   Abdominal   Peds  Hematology negative hematology ROS (+) Plt 217k   Anesthesia Other Findings Day of surgery medications reviewed with the patient.  Reproductive/Obstetrics                              Anesthesia Physical Anesthesia Plan  ASA: 2  Anesthesia Plan: Spinal   Post-op Pain Management: Tylenol  PO (pre-op)* and Toradol  IV (intra-op)*   Induction: Intravenous  PONV Risk Score and Plan: 2 and TIVA, Dexamethasone and Ondansetron   Airway Management Planned: Natural Airway and Simple Face Mask  Additional Equipment:   Intra-op Plan:   Post-operative Plan:   Informed Consent: I have reviewed the patients History and Physical, chart, labs and discussed the procedure including the risks, benefits and alternatives for the proposed anesthesia with the patient or authorized representative who has indicated his/her understanding and acceptance.     Dental advisory given  Plan Discussed with: CRNA  Anesthesia Plan Comments:          Anesthesia Quick Evaluation

## 2024-10-29 ENCOUNTER — Observation Stay (HOSPITAL_COMMUNITY)

## 2024-10-29 ENCOUNTER — Ambulatory Visit (HOSPITAL_COMMUNITY)

## 2024-10-29 ENCOUNTER — Encounter (HOSPITAL_COMMUNITY): Payer: Self-pay | Admitting: Orthopaedic Surgery

## 2024-10-29 ENCOUNTER — Encounter (HOSPITAL_COMMUNITY): Admission: RE | Disposition: A | Payer: Self-pay | Source: Home / Self Care | Attending: Orthopaedic Surgery

## 2024-10-29 ENCOUNTER — Ambulatory Visit (HOSPITAL_COMMUNITY): Admitting: Anesthesiology

## 2024-10-29 ENCOUNTER — Observation Stay (HOSPITAL_COMMUNITY)
Admission: RE | Admit: 2024-10-29 | Discharge: 2024-10-30 | Disposition: A | Discharge status: Home / Self Care | Attending: Orthopaedic Surgery | Admitting: Orthopaedic Surgery

## 2024-10-29 ENCOUNTER — Other Ambulatory Visit: Payer: Self-pay

## 2024-10-29 DIAGNOSIS — Z79899 Other long term (current) drug therapy: Secondary | ICD-10-CM | POA: Insufficient documentation

## 2024-10-29 DIAGNOSIS — E1169 Type 2 diabetes mellitus with other specified complication: Secondary | ICD-10-CM

## 2024-10-29 DIAGNOSIS — E119 Type 2 diabetes mellitus without complications: Secondary | ICD-10-CM | POA: Insufficient documentation

## 2024-10-29 DIAGNOSIS — R42 Dizziness and giddiness: Secondary | ICD-10-CM | POA: Insufficient documentation

## 2024-10-29 DIAGNOSIS — M1611 Unilateral primary osteoarthritis, right hip: Secondary | ICD-10-CM | POA: Diagnosis not present

## 2024-10-29 DIAGNOSIS — Z7984 Long term (current) use of oral hypoglycemic drugs: Secondary | ICD-10-CM | POA: Insufficient documentation

## 2024-10-29 DIAGNOSIS — I10 Essential (primary) hypertension: Secondary | ICD-10-CM | POA: Insufficient documentation

## 2024-10-29 DIAGNOSIS — Z96641 Presence of right artificial hip joint: Secondary | ICD-10-CM

## 2024-10-29 DIAGNOSIS — M25551 Pain in right hip: Secondary | ICD-10-CM | POA: Diagnosis present

## 2024-10-29 HISTORY — PX: TOTAL HIP ARTHROPLASTY: SHX124

## 2024-10-29 LAB — ABO/RH: ABO/RH(D): O POS

## 2024-10-29 LAB — GLUCOSE, CAPILLARY
Glucose-Capillary: 123 mg/dL — ABNORMAL HIGH (ref 70–99)
Glucose-Capillary: 136 mg/dL — ABNORMAL HIGH (ref 70–99)
Glucose-Capillary: 233 mg/dL — ABNORMAL HIGH (ref 70–99)
Glucose-Capillary: 228 mg/dL — ABNORMAL HIGH (ref 70–99)

## 2024-10-29 SURGERY — ARTHROPLASTY, HIP, TOTAL, ANTERIOR APPROACH
Anesthesia: Spinal | Site: Hip | Laterality: Right

## 2024-10-29 MED ORDER — BUPIVACAINE IN DEXTROSE 0.75-8.25 % IT SOLN
INTRATHECAL | Status: AC
  Filled 2024-10-29: qty 2

## 2024-10-29 MED ORDER — PROPOFOL 1000 MG/100ML IV EMUL
INTRAVENOUS | Status: AC
  Filled 2024-10-29: qty 100

## 2024-10-29 MED ORDER — CHLORHEXIDINE GLUCONATE 0.12 % MT SOLN
15.0000 mL | Freq: Once | OROMUCOSAL | Status: AC
  Administered 2024-10-29: 15 mL via OROMUCOSAL
  Filled 2024-10-29: qty 15

## 2024-10-29 MED ORDER — ACETAMINOPHEN 500 MG PO TABS
1000.0000 mg | ORAL_TABLET | Freq: Once | ORAL | Status: AC
  Administered 2024-10-29: 1000 mg via ORAL
  Filled 2024-10-29: qty 2

## 2024-10-29 MED ORDER — SODIUM CHLORIDE 0.9 % IV SOLN: INTRAVENOUS | Status: DC

## 2024-10-29 MED ORDER — DILTIAZEM HCL ER COATED BEADS 120 MG PO CP24
120.0000 mg | ORAL_CAPSULE | Freq: Every day | ORAL | Status: DC
  Administered 2024-10-30: 120 mg via ORAL
  Filled 2024-10-29: qty 1

## 2024-10-29 MED ORDER — INSULIN ASPART 100 UNIT/ML IJ SOLN: 0.0000 [IU] | INTRAMUSCULAR | Status: DC | PRN

## 2024-10-29 MED ORDER — HYDROMORPHONE HCL 1 MG/ML IJ SOLN: 0.5000 mg | INTRAMUSCULAR | Status: DC | PRN

## 2024-10-29 MED ORDER — FENTANYL CITRATE (PF) 100 MCG/2ML IJ SOLN: 25.0000 ug | INTRAMUSCULAR | Status: DC | PRN

## 2024-10-29 MED ORDER — MIDAZOLAM HCL (PF) 2 MG/2ML IJ SOLN
INTRAMUSCULAR | Status: DC | PRN
  Administered 2024-10-29: 2 mg via INTRAVENOUS

## 2024-10-29 MED ORDER — 0.9 % SODIUM CHLORIDE (POUR BTL) OPTIME
TOPICAL | Status: DC | PRN
  Administered 2024-10-29: 1000 mL

## 2024-10-29 MED ORDER — INSULIN ASPART 100 UNIT/ML IJ SOLN
0.0000 [IU] | Freq: Three times a day (TID) | INTRAMUSCULAR | Status: DC
  Administered 2024-10-29: 5 [IU] via SUBCUTANEOUS
  Administered 2024-10-30: 2 [IU] via SUBCUTANEOUS
  Filled 2024-10-29: qty 2
  Filled 2024-10-29: qty 5

## 2024-10-29 MED ORDER — ONDANSETRON HCL 4 MG/2ML IJ SOLN: 4.0000 mg | Freq: Four times a day (QID) | INTRAMUSCULAR | Status: DC | PRN

## 2024-10-29 MED ORDER — INSULIN ASPART 100 UNIT/ML IJ SOLN
0.0000 [IU] | Freq: Every day | INTRAMUSCULAR | Status: DC
  Administered 2024-10-29: 2 [IU] via SUBCUTANEOUS
  Filled 2024-10-29: qty 2

## 2024-10-29 MED ORDER — OXYCODONE HCL 5 MG PO TABS
10.0000 mg | ORAL_TABLET | ORAL | Status: DC | PRN
  Administered 2024-10-30: 10 mg via ORAL

## 2024-10-29 MED ORDER — CEFAZOLIN SODIUM-DEXTROSE 2-4 GM/100ML-% IV SOLN
2.0000 g | INTRAVENOUS | Status: AC
  Administered 2024-10-29: 2 g via INTRAVENOUS
  Filled 2024-10-29: qty 100

## 2024-10-29 MED ORDER — ONDANSETRON HCL 4 MG/2ML IJ SOLN: 4.0000 mg | Freq: Once | INTRAMUSCULAR | Status: DC | PRN

## 2024-10-29 MED ORDER — SODIUM CHLORIDE 0.9 % IR SOLN
Status: DC | PRN
  Administered 2024-10-29: 1000 mL

## 2024-10-29 MED ORDER — METHOCARBAMOL 1000 MG/10ML IJ SOLN
500.0000 mg | Freq: Four times a day (QID) | INTRAMUSCULAR | Status: DC | PRN
Start: 2024-10-29 — End: 2024-10-30

## 2024-10-29 MED ORDER — METHOCARBAMOL 500 MG PO TABS
500.0000 mg | ORAL_TABLET | Freq: Four times a day (QID) | ORAL | Status: DC | PRN
  Administered 2024-10-29 – 2024-10-30 (×2): 500 mg via ORAL
  Filled 2024-10-29 (×2): qty 1

## 2024-10-29 MED ORDER — LACTATED RINGERS IV SOLN: INTRAVENOUS | Status: DC

## 2024-10-29 MED ORDER — DOCUSATE SODIUM 100 MG PO CAPS
100.0000 mg | ORAL_CAPSULE | Freq: Two times a day (BID) | ORAL | Status: DC
  Administered 2024-10-29 – 2024-10-30 (×3): 100 mg via ORAL
  Filled 2024-10-29 (×3): qty 1

## 2024-10-29 MED ORDER — ONDANSETRON HCL 4 MG PO TABS: 4.0000 mg | ORAL_TABLET | Freq: Four times a day (QID) | ORAL | Status: DC | PRN

## 2024-10-29 MED ORDER — VITAMIN B-12 1000 MCG PO TABS
1000.0000 ug | ORAL_TABLET | Freq: Every day | ORAL | Status: DC
  Administered 2024-10-29 – 2024-10-30 (×2): 1000 ug via ORAL
  Filled 2024-10-29 (×2): qty 1

## 2024-10-29 MED ORDER — TRANEXAMIC ACID-NACL 1000-0.7 MG/100ML-% IV SOLN
1000.0000 mg | INTRAVENOUS | Status: AC
  Administered 2024-10-29: 1000 mg via INTRAVENOUS
  Filled 2024-10-29: qty 100

## 2024-10-29 MED ORDER — PHENYLEPHRINE 80 MCG/ML (10ML) SYRINGE FOR IV PUSH (FOR BLOOD PRESSURE SUPPORT)
PREFILLED_SYRINGE | INTRAVENOUS | Status: DC | PRN
  Administered 2024-10-29 (×2): 160 ug via INTRAVENOUS

## 2024-10-29 MED ORDER — ORAL CARE MOUTH RINSE: 15.0000 mL | Freq: Once | OROMUCOSAL | Status: AC

## 2024-10-29 MED ORDER — PHENOL 1.4 % MT LIQD: 1.0000 | OROMUCOSAL | Status: DC | PRN

## 2024-10-29 MED ORDER — METFORMIN HCL ER 500 MG PO TB24
1000.0000 mg | ORAL_TABLET | Freq: Every day | ORAL | Status: DC
  Administered 2024-10-30: 1000 mg via ORAL
  Filled 2024-10-29: qty 2

## 2024-10-29 MED ORDER — DEXAMETHASONE SOD PHOSPHATE PF 10 MG/ML IJ SOLN
INTRAMUSCULAR | Status: DC | PRN
  Administered 2024-10-29: 10 mg via INTRAVENOUS

## 2024-10-29 MED ORDER — PROPOFOL 500 MG/50ML IV EMUL
INTRAVENOUS | Status: DC | PRN
  Administered 2024-10-29: 100 ug/kg/min via INTRAVENOUS

## 2024-10-29 MED ORDER — BUPIVACAINE IN DEXTROSE 0.75-8.25 % IT SOLN
INTRATHECAL | Status: DC | PRN
  Administered 2024-10-29: 2 mL via INTRATHECAL

## 2024-10-29 MED ORDER — LIDOCAINE 2% (20 MG/ML) 5 ML SYRINGE
INTRAMUSCULAR | Status: AC
  Filled 2024-10-29: qty 5

## 2024-10-29 MED ORDER — CEFAZOLIN SODIUM-DEXTROSE 2-4 GM/100ML-% IV SOLN
2.0000 g | Freq: Four times a day (QID) | INTRAVENOUS | Status: AC
  Administered 2024-10-29 (×2): 2 g via INTRAVENOUS
  Filled 2024-10-29 (×2): qty 100

## 2024-10-29 MED ORDER — ONDANSETRON HCL 4 MG/2ML IJ SOLN
INTRAMUSCULAR | Status: DC | PRN
  Administered 2024-10-29: 4 mg via INTRAVENOUS

## 2024-10-29 MED ORDER — VASOPRESSIN 20 UNIT/ML IV SOLN
INTRAVENOUS | Status: AC
Start: 2024-10-29 — End: 2024-10-29
  Filled 2024-10-29: qty 1

## 2024-10-29 MED ORDER — EPHEDRINE SULFATE-NACL 50-0.9 MG/10ML-% IV SOSY
PREFILLED_SYRINGE | INTRAVENOUS | Status: DC | PRN
  Administered 2024-10-29: 10 mg via INTRAVENOUS

## 2024-10-29 MED ORDER — PANTOPRAZOLE SODIUM 40 MG PO TBEC
40.0000 mg | DELAYED_RELEASE_TABLET | Freq: Every day | ORAL | Status: DC
  Administered 2024-10-29 – 2024-10-30 (×2): 40 mg via ORAL
  Filled 2024-10-29 (×2): qty 1

## 2024-10-29 MED ORDER — MIDAZOLAM HCL 2 MG/2ML IJ SOLN
INTRAMUSCULAR | Status: AC
  Filled 2024-10-29: qty 2

## 2024-10-29 MED ORDER — ACETAMINOPHEN 325 MG PO TABS: 325.0000 mg | ORAL_TABLET | Freq: Four times a day (QID) | ORAL | Status: DC | PRN

## 2024-10-29 MED ORDER — POVIDONE-IODINE 10 % EX SWAB
2.0000 | Freq: Once | CUTANEOUS | Status: AC
  Administered 2024-10-29: 2 via TOPICAL

## 2024-10-29 MED ORDER — ALUM & MAG HYDROXIDE-SIMETH 200-200-20 MG/5ML PO SUSP
30.0000 mL | ORAL | Status: DC | PRN
Start: 2024-10-29 — End: 2024-10-30

## 2024-10-29 MED ORDER — ASPIRIN 81 MG PO CHEW
81.0000 mg | CHEWABLE_TABLET | Freq: Two times a day (BID) | ORAL | Status: DC
  Administered 2024-10-29 – 2024-10-30 (×2): 81 mg via ORAL
  Filled 2024-10-29 (×2): qty 1

## 2024-10-29 MED ORDER — ONDANSETRON HCL 4 MG/2ML IJ SOLN
INTRAMUSCULAR | Status: AC
  Filled 2024-10-29: qty 2

## 2024-10-29 MED ORDER — DAPAGLIFLOZIN PROPANEDIOL 5 MG PO TABS
5.0000 mg | ORAL_TABLET | Freq: Every day | ORAL | Status: DC
  Administered 2024-10-29 – 2024-10-30 (×2): 5 mg via ORAL
  Filled 2024-10-29 (×2): qty 1

## 2024-10-29 MED ORDER — OXYCODONE HCL 5 MG PO TABS
5.0000 mg | ORAL_TABLET | ORAL | Status: DC | PRN
  Administered 2024-10-29 – 2024-10-30 (×3): 10 mg via ORAL
  Filled 2024-10-29 (×4): qty 2

## 2024-10-29 MED ORDER — MENTHOL 3 MG MT LOZG: 1.0000 | LOZENGE | OROMUCOSAL | Status: DC | PRN

## 2024-10-29 SURGICAL SUPPLY — 41 items
BAG COUNTER SPONGE SURGICOUNT (BAG) ×1 IMPLANT
BENZOIN TINCTURE PRP APPL 2/3 (GAUZE/BANDAGES/DRESSINGS) ×1 IMPLANT
BLADE CLIPPER SURG (BLADE) IMPLANT
BLADE SAW SGTL 18X1.27X75 (BLADE) ×1 IMPLANT
COVER SURGICAL LIGHT HANDLE (MISCELLANEOUS) ×1 IMPLANT
CUP GRIPTION SECTOR 62MM (Orthopedic Implant) IMPLANT
DRAPE C-ARM 42X72 X-RAY (DRAPES) ×1 IMPLANT
DRAPE STERI IOBAN 125X83 (DRAPES) ×1 IMPLANT
DRAPE U-SHAPE 47X51 STRL (DRAPES) ×3 IMPLANT
DRSG AQUACEL AG ADV 3.5X10 (GAUZE/BANDAGES/DRESSINGS) ×1 IMPLANT
DURAPREP 26ML APPLICATOR (WOUND CARE) ×1 IMPLANT
ELECT BLADE 6.5 EXT (BLADE) IMPLANT
ELECTRODE BLDE 4.0 EZ CLN MEGD (MISCELLANEOUS) ×1 IMPLANT
ELECTRODE REM PT RTRN 9FT ADLT (ELECTROSURGICAL) ×1 IMPLANT
FACESHIELD WRAPAROUND OR TEAM (MASK) ×2 IMPLANT
GLOVE BIOGEL PI IND STRL 8 (GLOVE) ×2 IMPLANT
GLOVE ECLIPSE 8.0 STRL XLNG CF (GLOVE) ×1 IMPLANT
GLOVE ORTHO TXT STRL SZ7.5 (GLOVE) ×2 IMPLANT
GOWN STRL REUS W/ TWL LRG LVL3 (GOWN DISPOSABLE) ×2 IMPLANT
GOWN STRL REUS W/ TWL XL LVL3 (GOWN DISPOSABLE) ×2 IMPLANT
HEAD CERAMIC 36 PLUS 8.5 12 14 (Hips) IMPLANT
KIT BASIN OR (CUSTOM PROCEDURE TRAY) ×1 IMPLANT
KIT TURNOVER KIT B (KITS) ×1 IMPLANT
LINER NEUTRAL 62MMC36MM P4 (Liner) IMPLANT
MANIFOLD NEPTUNE II (INSTRUMENTS) ×1 IMPLANT
PACK TOTAL JOINT (CUSTOM PROCEDURE TRAY) ×1 IMPLANT
PAD ARMBOARD POSITIONER FOAM (MISCELLANEOUS) ×1 IMPLANT
SET HNDPC FAN SPRY TIP SCT (DISPOSABLE) ×1 IMPLANT
SOLN 0.9% NACL POUR BTL 1000ML (IV SOLUTION) ×1 IMPLANT
SOLN STERILE WATER BTL 1000 ML (IV SOLUTION) ×2 IMPLANT
STAPLER SKIN PROX 35W (STAPLE) IMPLANT
STEM FEMORAL SZ9 HIGH ACTIS (Stem) IMPLANT
STRIP CLOSURE SKIN 1/2X4 (GAUZE/BANDAGES/DRESSINGS) ×2 IMPLANT
SUT ETHIBOND NAB CT1 #1 30IN (SUTURE) ×1 IMPLANT
SUT MNCRL AB 4-0 PS2 18 (SUTURE) IMPLANT
SUT VIC AB 0 CT1 27XBRD ANBCTR (SUTURE) ×1 IMPLANT
SUT VIC AB 1 CT1 27XBRD ANBCTR (SUTURE) ×1 IMPLANT
SUT VIC AB 2-0 CT1 TAPERPNT 27 (SUTURE) ×1 IMPLANT
TOWEL GREEN STERILE (TOWEL DISPOSABLE) ×1 IMPLANT
TOWEL GREEN STERILE FF (TOWEL DISPOSABLE) ×1 IMPLANT
TRAY FOLEY W/BAG SLVR 16FR ST (SET/KITS/TRAYS/PACK) IMPLANT

## 2024-10-29 NOTE — Anesthesia Procedure Notes (Signed)
 Spinal  Patient location during procedure: OR Start time: 10/29/2024 9:06 AM End time: 10/29/2024 9:09 AM Reason for block: surgical anesthesia Staffing Performed: anesthesiologist  Anesthesiologist: Corinne Garnette BRAVO, MD Performed by: Corinne Garnette BRAVO, MD Authorized by: Corinne Garnette BRAVO, MD   Preanesthetic Checklist Completed: patient identified, IV checked, risks and benefits discussed, surgical consent, monitors and equipment checked, pre-op evaluation and timeout performed Spinal Block Patient position: sitting Prep: DuraPrep and site prepped and draped Patient monitoring: continuous pulse ox and blood pressure Approach: midline Location: L3-4 Injection technique: single-shot Needle Needle type: Pencan  Needle gauge: 24 G Assessment Events: CSF return Additional Notes Functioning IV was confirmed and monitors were applied. Sterile prep and drape, including hand hygiene, mask and sterile gloves were used. The patient was positioned and the spine was prepped. The skin was anesthetized with lidocaine .  Free flow of clear CSF was obtained prior to injecting local anesthetic into the CSF.  The spinal needle aspirated freely following injection.  The needle was carefully withdrawn.  The patient tolerated the procedure well. Consent was obtained prior to procedure with all questions answered and concerns addressed. Risks including but not limited to bleeding, infection, nerve damage, paralysis, failed block, inadequate analgesia, allergic reaction, high spinal, itching and headache were discussed and the patient wished to proceed.   Garnette Corinne, MD

## 2024-10-29 NOTE — Op Note (Signed)
 Operative Note  Date of operation: 10/29/2024 Preoperative diagnosis: Right hip primary osteoarthritis Postoperative diagnosis: Same  Procedure: Right direct anterior total hip arthroplasty  Implants: Implant Name Type Inv. Item Serial No. Manufacturer Lot No. LRB No. Used Action  CUP LINZIE LEARN - [id number] Orthopedic Implant CUP GRIPTION SECTOR  DEPUY ORTHOPAEDICS [id number] Right 1 Implanted  LINER NEUTRAL 62MMC36MM P4 - [id number] Liner LINER NEUTRAL 62MMC36MM P4  DEPUY ORTHOPAEDICS [id number] Right 1 Implanted  STEM FEMORAL SZ9 HIGH ACTIS - [id number] Stem STEM FEMORAL SZ9 HIGH ACTIS  DEPUY ORTHOPAEDICS [id number] Right 1 Implanted  HEAD CERAMIC 36 PLUS 8.5 12 14  - [id number] Hips HEAD CERAMIC 36 PLUS 8.5 12 14   DEPUY ORTHOPAEDICS [id number] Right 1 Implanted   Surgeon: Lonni GRADE. Vernetta, MD Assistant: Tory Gaskins, PA-C  Anesthesia: Spinal EBL: 250 cc Antibiotics: IV Ancef Complications: None  Indications: The patient is an active 68 year old gentleman with debilitating arthritis involving his right hip.  This is well-documented for several years now.  At this point his right hip pain has become daily and it is detrimentally affecting his mobility, his quality of life and his actives day living.  His x-rays show complete loss of joint space with bone-on-bone wear.  He does wish for the hip replacement.  We agree with this as well.  We did discuss in length in detail the risks of acute blood loss anemia, nerve and vessel injury, fracture, infection, DVT, implant failure, dislocation, leg length differences and wound healing issues.  He understands that our goals are hopefully decreased pain, improved mobility and improved quality of life.  Procedure description: After informed consent was obtained and the appropriate right hip was marked, the patient was brought to the operating room and set up on the stretcher where spinal anesthesia was obtained.  He was laid in supine position  on stretcher and a Foley catheter was placed.  Traction boots were placed on both his feet and next he was placed supine on the Hana fracture table with a perineal post and placed in both legs in inline skeletal traction device with no traction applied.  His right operative hip and pelvis were assessed radiographically.  The right hip was prepped and draped with DuraPrep and sterile drapes.  A timeout was called and is identified as correct patient the correct right hip.  An incision was then made just inferior and posterior to the ASIS and carried slightly obliquely down the leg.  Dissection was carried down to the tensor fascia lata muscle and the tensor fascia was divided longitudinally to proceed with a direct tender approach the hip.  Circumflex vessels were identified and cauterized.  The hip capsule was identified and opened up in L-type format finding a moderate joint effusion.  Cobra tractors placed around the medial and lateral femoral neck and a femoral neck cut was made with an oscillating saw just proximal to the lesser trochanter and this cut was completed with an osteotome.  A corkscrew was placed in the femoral head and the femoral head was removed in its entirety and there was a wide area devoid of cartilage.  A bent Hohmann was then placed over the medial acetabular rim and the last several labrum and other debris removed.  Reaming was then initiated from a size 43 reamer and stepwise increments going up to a size 61 reamer with all reamers placed under direct visualization and the last reamer placed under direct fluoroscopy in order to obtain the depth and reaming, the inclination  and anteversion.  The real DePuy sector GRIPTION acetabular component size 62 was then placed without difficulty followed by 36+4 polythene liner for that size 62 acetabular component.  Attention was then turned to the femur.  With the right leg externally rotated to 120 degrees, extended and adducted, a Mueller  retractors placed medially and a Hohmann tractor behind the greater trochanter.  The lateral joint capsule was released and a box cutting osteotome was used into the femoral canal.  Broaching was then initiated using the Actis broaching system from a size 0 going up to a size 9.  With a size 9 in place we trialed a high offset femoral neck and a 36+1.5 trial head ball.  The right leg was brought over and up and with traction and internal rotation reduced in the pelvis.  Based on radiographic and clinical assessment we needed more leg length.  We dislocated the hip remove the trial components.  We then placed the real Actis femoral component with high offset size 9 and with 36+8.5 ceramic head ball.  Again this was reduced in the pelvis and we are pleased with leg length, offset, range of motion and stability assessed radiographically and clinically.  Soft tissue was then irrigated with normal saline solution.  The joint capsule was closed with interrupted #1 Ethibond suture followed by #1 Vicryl to close the tensor fascia.  0 Vicryl used to close the deep tissue and 2-0 Vicryl used to close subcutaneous tissue.  Skin was closed with staples.  An Aquacel dressing was applied.  The patient was taken off the Hana table and taken recovery room.  Tory Gaskins, PA-C did assist during the interrogation beginning to end and his assistance was crucial and medically necessary for soft tissue management and retraction, helping guide implant placement and a layered closure of the wound.

## 2024-10-29 NOTE — Interval H&P Note (Signed)
 History and Physical Interval Note: The patient understands that he is here today for a right total hip replacement to treat his significant right hip pain and arthritis.  There has been no acute or interval change in his medical status.  The risks and benefits of surgery have been discussed in detail and informed consent has been obtained.  The right operative hip has been marked.  10/29/2024 7:08 AM  Jason [name]  has presented today for surgery, with the diagnosis of osteoarthritis right hip.  The various methods of treatment have been discussed with the patient and family. After consideration of risks, benefits and other options for treatment, the patient has consented to  Procedure(s): ARTHROPLASTY, HIP, TOTAL, ANTERIOR APPROACH (Right) as a surgical intervention.  The patient's history has been reviewed, patient examined, no change in status, stable for surgery.  I have reviewed the patient's chart and labs.  Questions were answered to the patient's satisfaction.     Lonni CINDERELLA Poli

## 2024-10-29 NOTE — Evaluation (Signed)
 Physical Therapy Evaluation Patient Details Name: Jason [name] MRN: [id number] DOB: 1956-12-04 Today's Date: 10/29/2024  History of Present Illness  68 y.o. male presents to [facility] hospital on 10/29/2024 for elective R THA. PMH includes HTN and DMII.  Clinical Impression  Pt presents to PT with deficits in functional mobility, gait, balance, strength, ROM. Pt is able to ambulate for household distances with support of the RW. PT provides education on the THA exercise packet and encourages frequent mobilization with staff assistance. PT will follow up tomorrow for a progression of gait and to initiate stair training.        If plan is discharge home, recommend the following: A little help with walking and/or transfers;A little help with bathing/dressing/bathroom;Assistance with cooking/housework;Assist for transportation;Help with stairs or ramp for entrance   Can travel by private vehicle        Equipment Recommendations Rolling walker (2 wheels);BSC/3in1  Recommendations for Other Services       Functional Status Assessment Patient has had a recent decline in their functional status and demonstrates the ability to make significant improvements in function in a reasonable and predictable amount of time.     Precautions / Restrictions Precautions Precautions: Fall Recall of Precautions/Restrictions: Intact Precaution/Restrictions Comments: no hip precautions Restrictions Weight Bearing Restrictions Per Provider Order: Yes RLE Weight Bearing Per Provider Order: Weight bearing as tolerated      Mobility  Bed Mobility Overal bed mobility: Needs Assistance Bed Mobility: Supine to Sit, Sit to Supine     Supine to sit: Supervision Sit to supine: Min assist        Transfers Overall transfer level: Needs assistance Equipment used: Rolling walker (2 wheels) Transfers: Sit to/from Stand Sit to Stand: Contact guard assist                 Ambulation/Gait Ambulation/Gait assistance: Contact guard assist Gait Distance (Feet): 150 Feet Assistive device: Rolling walker (2 wheels) Gait Pattern/deviations: Step-through pattern Gait velocity: reduced Gait velocity interpretation: <1.8 ft/sec, indicate of risk for recurrent falls   General Gait Details: slowed step-through gait, reduced stance time on RLE  Stairs            Wheelchair Mobility     Tilt Bed    Modified Rankin (Stroke Patients Only)       Balance Overall balance assessment: Needs assistance Sitting-balance support: No upper extremity supported, Feet supported Sitting balance-Leahy Scale: Good     Standing balance support: Bilateral upper extremity supported, Reliant on assistive device for balance Standing balance-Leahy Scale: Poor                               Pertinent Vitals/Pain Pain Assessment Pain Assessment: 0-10 Pain Score: 8  Pain Location: R hip Pain Descriptors / Indicators: Sore Pain Intervention(s): Monitored during session    Home Living Family/patient expects to be discharged to:: Private residence Living Arrangements: Children;Other relatives Available Help at Discharge: Family;Available PRN/intermittently Type of Home: House Home Access: Stairs to enter Entrance Stairs-Rails: Can reach both Entrance Stairs-Number of Steps: 3   Home Layout: One level Home Equipment: Cane - single point      Prior Function Prior Level of Function : Independent/Modified Independent;Working/employed;Driving             Mobility Comments: ambulatory without DME ADLs Comments: working for medical [employer]     Extremity/Trunk Assessment   Upper Extremity Assessment Upper Extremity Assessment: Overall [facility]  for tasks assessed    Lower Extremity Assessment Lower Extremity Assessment: RLE deficits/detail RLE Deficits / Details: generalized post-op weakness as anticipated on POD 0    Cervical / Trunk  Assessment Cervical / Trunk Assessment: Normal  Communication   Communication Communication: No apparent difficulties    Cognition Arousal: Alert Behavior During Therapy: WFL for tasks assessed/performed   PT - Cognitive impairments: No apparent impairments                         Following commands: Intact       Cueing Cueing Techniques: Verbal cues     General Comments General comments (skin integrity, edema, etc.): VSS on RA    Exercises     Assessment/Plan    PT Assessment Patient needs continued PT services  PT Problem List Decreased strength;Decreased activity tolerance;Decreased balance;Decreased mobility;Decreased knowledge of precautions;Pain       PT Treatment Interventions DME instruction;Gait training;Stair training;Functional mobility training;Therapeutic activities;Therapeutic exercise;Balance training;Neuromuscular re-education;Patient/family education    PT Goals (Current goals can be found in the Care Plan section)  Acute Rehab PT Goals Patient Stated Goal: to return home PT Goal Formulation: With patient Time For Goal Achievement: 11/02/24 Potential to Achieve Goals: Good    Frequency 7X/week     Co-evaluation               AM-PAC PT 6 Clicks Mobility  Outcome Measure Help needed turning from your back to your side while in a flat bed without using bedrails?: A Little Help needed moving from lying on your back to sitting on the side of a flat bed without using bedrails?: A Little Help needed moving to and from a bed to a chair (including a wheelchair)?: A Little Help needed standing up from a chair using your arms (e.g., wheelchair or bedside chair)?: A Little Help needed to walk in hospital room?: A Little Help needed climbing 3-5 steps with a railing? : A Little 6 Click Score: 18    End of Session Equipment Utilized During Treatment: Gait belt Activity Tolerance: Patient tolerated treatment well Patient left: in  bed;with call bell/phone within reach Nurse Communication: Mobility status PT Visit Diagnosis: Other abnormalities of gait and mobility (R26.89);Muscle weakness (generalized) (M62.81)    Time: 8549-8487 PT Time Calculation (min) (ACUTE ONLY): 22 min   Charges:   PT Evaluation $PT Eval Low Complexity: 1 Low   PT General Charges $$ ACUTE PT VISIT: 1 Visit       Bernardino JINNY Ruth, PT, DPT Acute Rehabilitation Office [phone]   Bernardino JINNY Ruth 10/29/2024, 3:22 PM

## 2024-10-29 NOTE — Anesthesia Postprocedure Evaluation (Signed)
 Anesthesia Post Note  Patient: Jason [name]  Procedure(s) Performed: ARTHROPLASTY, HIP, TOTAL, ANTERIOR APPROACH (Right: Hip)     Patient location during evaluation: PACU Anesthesia Type: Spinal Level of consciousness: awake, awake and alert and oriented Pain management: pain level controlled Vital Signs Assessment: post-procedure vital signs reviewed and stable Respiratory status: spontaneous breathing, nonlabored ventilation and respiratory function stable Cardiovascular status: blood pressure returned to baseline and stable Postop Assessment: no headache, no backache, spinal receding and no apparent nausea or vomiting Anesthetic complications: no   No notable events documented.  Last Vitals:  Vitals:   10/29/24 1221 10/29/24 1238  BP: 111/71 117/75  Pulse: 69 72  Resp: 10 20  Temp:  36.6 C  SpO2: 97% 97%    Last Pain:  Vitals:   10/29/24 1435  TempSrc:   PainSc: 6                  Garnette FORBES Skillern

## 2024-10-29 NOTE — Transfer of Care (Signed)
 Immediate Anesthesia Transfer of Care Note  Patient: Jason [name]  Procedure(s) Performed: ARTHROPLASTY, HIP, TOTAL, ANTERIOR APPROACH (Right: Hip)  Patient Location: PACU  Anesthesia Type:Spinal  Level of Consciousness: awake, alert , and oriented  Airway & Oxygen Therapy: Patient Spontanous Breathing  Post-op Assessment: Report given to RN and Post -op Vital signs reviewed and stable  Post vital signs: Reviewed and stable  Last Vitals:  Vitals Value Taken Time  BP 90/58 10/29/24 10:45  Temp    Pulse 77 10/29/24 10:46  Resp 13 10/29/24 10:46  SpO2 95 % 10/29/24 10:46  Vitals shown include unfiled device data.  Last Pain:  Vitals:   10/29/24 0727  TempSrc:   PainSc: 0-No pain         Complications: No notable events documented.

## 2024-10-29 NOTE — Plan of Care (Signed)
  Problem: Education: Goal: Knowledge of General Education information will improve Description: Including pain rating scale, medication(s)/side effects and non-pharmacologic comfort measures Outcome: Progressing   Problem: Health Behavior/Discharge Planning: Goal: Ability to manage health-related needs will improve Outcome: Progressing   Problem: Clinical Measurements: Goal: Ability to maintain clinical measurements within normal limits will improve Outcome: Progressing Goal: Will remain free from infection Outcome: Progressing Goal: Diagnostic test results will improve Outcome: Progressing Goal: Respiratory complications will improve Outcome: Progressing Goal: Cardiovascular complication will be avoided Outcome: Progressing   Problem: Activity: Goal: Risk for activity intolerance will decrease Outcome: Progressing   Problem: Nutrition: Goal: Adequate nutrition will be maintained Outcome: Progressing   Problem: Coping: Goal: Level of anxiety will decrease Outcome: Progressing   Problem: Elimination: Goal: Will not experience complications related to bowel motility Outcome: Progressing Goal: Will not experience complications related to urinary retention Outcome: Progressing   Problem: Pain Managment: Goal: General experience of comfort will improve and/or be controlled Outcome: Progressing   Problem: Safety: Goal: Ability to remain free from injury will improve Outcome: Progressing   Problem: Skin Integrity: Goal: Risk for impaired skin integrity will decrease Outcome: Progressing   Problem: Education: Goal: Ability to describe self-care measures that may prevent or decrease complications (Diabetes Survival Skills Education) will improve Outcome: Progressing Goal: Individualized Educational Video(s) Outcome: Progressing   Problem: Coping: Goal: Ability to adjust to condition or change in health will improve Outcome: Progressing   Problem: Fluid  Volume: Goal: Ability to maintain a balanced intake and output will improve Outcome: Progressing   Problem: Health Behavior/Discharge Planning: Goal: Ability to identify and utilize available resources and services will improve Outcome: Progressing Goal: Ability to manage health-related needs will improve Outcome: Progressing   Problem: Metabolic: Goal: Ability to maintain appropriate glucose levels will improve Outcome: Progressing   Problem: Nutritional: Goal: Maintenance of adequate nutrition will improve Outcome: Progressing Goal: Progress toward achieving an optimal weight will improve Outcome: Progressing   Problem: Skin Integrity: Goal: Risk for impaired skin integrity will decrease Outcome: Progressing   Problem: Tissue Perfusion: Goal: Adequacy of tissue perfusion will improve Outcome: Progressing   Problem: Education: Goal: Knowledge of the prescribed therapeutic regimen will improve Outcome: Progressing Goal: Understanding of discharge needs will improve Outcome: Progressing Goal: Individualized Educational Video(s) Outcome: Progressing   Problem: Activity: Goal: Ability to avoid complications of mobility impairment will improve Outcome: Progressing Goal: Ability to tolerate increased activity will improve Outcome: Progressing   Problem: Clinical Measurements: Goal: Postoperative complications will be avoided or minimized Outcome: Progressing   Problem: Pain Management: Goal: Pain level will decrease with appropriate interventions Outcome: Progressing   Problem: Skin Integrity: Goal: Will show signs of wound healing Outcome: Progressing

## 2024-10-30 ENCOUNTER — Encounter (HOSPITAL_COMMUNITY): Payer: Self-pay | Admitting: Orthopaedic Surgery

## 2024-10-30 ENCOUNTER — Observation Stay (HOSPITAL_COMMUNITY)

## 2024-10-30 DIAGNOSIS — M1611 Unilateral primary osteoarthritis, right hip: Secondary | ICD-10-CM | POA: Diagnosis not present

## 2024-10-30 LAB — BASIC METABOLIC PANEL WITH GFR
Anion gap: 8 (ref 5–15)
BUN: 21 mg/dL (ref 8–23)
CO2: 26 mmol/L (ref 22–32)
Calcium: 9.5 mg/dL (ref 8.9–10.3)
Chloride: 105 mmol/L (ref 98–111)
Creatinine, Ser: 1.29 mg/dL — ABNORMAL HIGH (ref 0.61–1.24)
GFR, Estimated: 60 mL/min — ABNORMAL LOW (ref >60)
Glucose, Bld: 151 mg/dL — ABNORMAL HIGH (ref 70–99)
Potassium: 5.4 mmol/L — ABNORMAL HIGH (ref 3.5–5.1)
Sodium: 139 mmol/L (ref 135–145)

## 2024-10-30 LAB — CBC
HCT: 41.3 % (ref 39.0–52.0)
Hemoglobin: 13.5 g/dL (ref 13.0–17.0)
MCH: 31.8 pg (ref 26.0–34.0)
MCHC: 32.7 g/dL (ref 30.0–36.0)
MCV: 97.2 fL (ref 80.0–100.0)
Platelets: 174 K/uL (ref 150–400)
RBC: 4.25 MIL/uL (ref 4.22–5.81)
RDW: 13.2 % (ref 11.5–15.5)
WBC: 14.5 K/uL — ABNORMAL HIGH (ref 4.0–10.5)
nRBC: 0 % (ref 0.0–0.2)

## 2024-10-30 LAB — GLUCOSE, CAPILLARY
Glucose-Capillary: 148 mg/dL — ABNORMAL HIGH (ref 70–99)
Glucose-Capillary: 157 mg/dL — ABNORMAL HIGH (ref 70–99)

## 2024-10-30 MED ORDER — OXYCODONE HCL 5 MG PO TABS: 5.0000 mg | ORAL_TABLET | Freq: Four times a day (QID) | ORAL | 0 refills | Status: AC | PRN

## 2024-10-30 MED ORDER — METHOCARBAMOL 500 MG PO TABS: 500.0000 mg | ORAL_TABLET | Freq: Four times a day (QID) | ORAL | 0 refills | Status: AC | PRN

## 2024-10-30 MED ORDER — ASPIRIN 81 MG PO CHEW: 81.0000 mg | CHEWABLE_TABLET | Freq: Two times a day (BID) | ORAL | 0 refills | Status: AC

## 2024-10-30 NOTE — Discharge Summary (Signed)
 Patient ID: Jason [name] MRN: [id number] DOB/AGE: 01-31-1956 68 y.o.  Admit date: 10/29/2024 Discharge date: 10/30/2024  Admission Diagnoses:  Principal Problem:   Unilateral primary osteoarthritis, right hip Active Problems:   Status post total replacement of right hip   Discharge Diagnoses:  Same  Past Medical History:  Diagnosis Date   Arthritis    Bell palsy    Diabetes mellitus without complication (HCC)    HTN (hypertension)    Hyperlipidemia     Surgeries: Procedure(s): ARTHROPLASTY, HIP, TOTAL, ANTERIOR APPROACH on 10/29/2024   Consultants:   Discharged Condition: Improved  Hospital Course: Jason [name] is an 68 y.o. male who was admitted 10/29/2024 for operative treatment ofUnilateral primary osteoarthritis, right hip. Patient has severe unremitting pain that affects sleep, daily activities, and work/hobbies. After pre-op clearance the patient was taken to the operating room on 10/29/2024 and underwent  Procedure(s): ARTHROPLASTY, HIP, TOTAL, ANTERIOR APPROACH.    Patient was given perioperative antibiotics:  Anti-infectives (From admission, onward)    Start     Dose/Rate Route Frequency Ordered Stop   10/29/24 1600  ceFAZolin (ANCEF) IVPB 2g/100 mL premix        2 g 200 mL/hr over 30 Minutes Intravenous Every 6 hours 10/29/24 1218 10/30/24 1255   10/29/24 0700  ceFAZolin (ANCEF) IVPB 2g/100 mL premix        2 g 200 mL/hr over 30 Minutes Intravenous On call to O.R. 10/29/24 0654 10/29/24 9092        Patient was given sequential compression devices, early ambulation, and chemoprophylaxis to prevent DVT.  Inpatient Morphine Milligram Equivalents Per Day 11/18 - 11/19   Values displayed are in units of MME/Day    Order Start / End Date Yesterday Today    fentaNYL (SUBLIMAZE) injection 25-50 mcg 11/18 - 11/18 0 of 45-90 --    Daily Totals  0 of 45-90 --       Patient benefited maximally from hospital stay and there were no complications.  He  did get lightheaded in the morning after surgery and had a small mechanical fall.  We did x-ray his right hip and that was normal and still did a CT scan of his head since this was done with his fall and that was normal.  Recent vital signs: Patient Vitals for the past 24 hrs:  BP Temp Temp src Pulse Resp SpO2  10/30/24 0842 129/74 98.8 F (37.1 C) Oral 64 18 99 %  10/30/24 0842 129/74 98.8 F (37.1 C) Oral 71 18 99 %  10/30/24 0837 129/74 -- -- 64 -- 99 %  10/30/24 0829 122/70 -- -- 71 -- --  10/30/24 0829 122/70 -- -- 71 20 --  10/30/24 0330 120/63 98.1 F (36.7 C) Oral 82 18 97 %  10/29/24 2254 108/74 98.4 F (36.9 C) Oral 89 18 97 %  10/29/24 1919 129/66 98.8 F (37.1 C) Oral 86 18 99 %  10/29/24 1529 110/63 (!) 97.4 F (36.3 C) Oral 83 18 98 %     Recent laboratory studies:  Recent Labs    10/30/24 0155  WBC 14.5*  HGB 13.5  HCT 41.3  PLT 174  NA 139  K 5.4*  CL 105  CO2 26  BUN 21  CREATININE 1.29*  GLUCOSE 151*  CALCIUM  9.5     Discharge Medications:   Allergies as of 10/30/2024       Reactions   Lisinopril    hyperkalemia   Reglan  [metoclopramide ] Anxiety  Patient had bad reaction requiring ativan  in addition to benadryl . He doesn't want to receive it any more        Medication List     TAKE these medications    aspirin 81 MG chewable tablet Chew 1 tablet (81 mg total) by mouth 2 (two) times daily.   cyanocobalamin  1000 MCG tablet Commonly known as: VITAMIN B12 Take 1 tablet (1,000 mcg total) by mouth daily.   dapagliflozin  propanediol 5 MG Tabs tablet Commonly known as: Farxiga  Take 1 tablet (5 mg total) by mouth daily.   diltiazem  120 MG 24 hr capsule Commonly known as: CARDIZEM  CD TAKE 1 CAPSULE BY MOUTH EVERY DAY   Insulin  Pen Needle 29G X Misc Use with insulin  pen   metFORMIN  500 MG 24 hr tablet Commonly known as: GLUCOPHAGE -XR Take 2 tablets (1,000 mg total) by mouth 2 (two) times daily with a meal. What changed: when to  take this   methocarbamol 500 MG tablet Commonly known as: ROBAXIN Take 1 tablet (500 mg total) by mouth every 6 (six) hours as needed for muscle spasms.   multivitamin with minerals Tabs tablet Take 1 tablet by mouth in the morning.   naproxen sodium 220 MG tablet Commonly known as: ALEVE Take 440 mg by mouth daily as needed (pain/headache).   ONE TOUCH ULTRA 2 w/Device Kit Use the device daily to check your blood sugars 1-4 times as instructed.   onetouch ultrasoft lancets Use to check blood sugar BID   OneTouch Verio test strip Generic drug: glucose blood USE TO TEST BLOOD SUGAR TWICE DAILY   oxyCODONE 5 MG immediate release tablet Commonly known as: Oxy IR/ROXICODONE Take 1-2 tablets (5-10 mg total) by mouth every 6 (six) hours as needed for moderate pain (pain score 4-6) (pain score 4-6).   Ozempic  (2 MG/DOSE) 8 MG/3ML Sopn Generic drug: Semaglutide  (2 MG/DOSE) Inject 2 mg into the skin every Saturday.   rosuvastatin  5 MG tablet Commonly known as: CRESTOR  TAKE 1 TABLET BY MOUTH EVERYDAY AT BEDTIME               Durable Medical Equipment  (From admission, onward)           Start     Ordered   10/29/24 1219  DME 3 n 1  Once        10/29/24 1218   10/29/24 1219  DME Walker rolling  Once       Question Answer Comment  Walker: With 5 Inch Wheels   Patient needs a walker to treat with the following condition Status post total replacement of right hip      10/29/24 1218            Diagnostic Studies: DG HIP PORT UNILAT WITH PELVIS 1V RIGHT Result Date: 10/30/2024 CLINICAL DATA:  Fall. Postop day 1 status post right hip replacement. EXAM: DG HIP (WITH OR WITHOUT PELVIS) 1V PORT RIGHT COMPARISON:  10/29/2024. FINDINGS: Right total hip arthroplasty demonstrates normal alignment. No acute fracture. Expected postoperative soft tissue gas and cutaneous staples. IMPRESSION: 1. No acute fracture identified. 2. Intact right total hip arthroplasty.  Electronically Signed   By: Harrietta Sherry M.D.   On: 10/30/2024 10:07   CT HEAD WO CONTRAST ( ) Result Date: 10/30/2024 EXAM: CT HEAD WITHOUT CONTRAST 10/30/2024 09:51:00 AM TECHNIQUE: CT of the head was performed without the administration of intravenous contrast. Automated exposure control, iterative reconstruction, and/or weight based adjustment of the mA/kV was utilized to reduce the radiation  dose to as low as reasonably achievable. COMPARISON: Head CT 07/20/2021 and MRI 07/24/2021. CLINICAL HISTORY: post fall FINDINGS: BRAIN AND VENTRICLES: There is no evidence of an acute infarct, intracranial hemorrhage, mass, midline shift, hydrocephalus, or extra-axial fluid collection. Cerebral volume is normal for age. ORBITS: No acute abnormality. SINUSES: Minimal mucosal thickening in the ethmoid sinuses. Clear mastoid air cells. SOFT TISSUES AND SKULL: No acute soft tissue abnormality. No skull fracture. IMPRESSION: 1. No acute intracranial abnormality. Electronically signed by: Dasie Hamburg MD 10/30/2024 10:04 AM EST RP Workstation: [id number]   DG Pelvis Portable Result Date: 10/29/2024 EXAM: 1 or 2 VIEW(S) XRAY OF THE PELVIS 10/29/2024 11:02:00 AM COMPARISON: None available. CLINICAL HISTORY: Status post right total hip replacement. FINDINGS: BONES AND JOINTS: Right total hip prosthesis is in place. No periprosthetic fracture or acute complicating fracture. SOFT TISSUES: Expected gas is noted in the overlying soft tissues. IMPRESSION: 1. Right total hip prosthesis in place with expected postoperative soft tissue gas. 2. No periprosthetic fracture or acute complicating fracture. Electronically signed by: Ryan Salvage MD 10/29/2024 02:20 PM EST RP Workstation: [id number]   DG HIP UNILAT WITH PELVIS 1V RIGHT Result Date: 10/29/2024 EXAM: 1 VIEW(S) XRAY OF THE RIGHT HIP 10/29/2024 10:26:00 AM COMPARISON: None available. CLINICAL HISTORY: Right total hip arthroplasty. FINDINGS: BONES AND JOINTS:  Fluoroscopic spot images of the right hip in the frontal projection demonstrate right total hip prosthesis with no findings of periprosthetic fracture or acute complicating feature. SOFT TISSUES: The soft tissues are unremarkable. IMPRESSION: 1. Right total hip prosthesis in appropriate position without periprosthetic fracture or acute complication. Electronically signed by: Ryan Salvage MD 10/29/2024 02:19 PM EST RP Workstation: [id number]   DG C-Arm 1-60 Min-No Report Result Date: 10/29/2024 Fluoroscopy was utilized by the requesting physician.  No radiographic interpretation.   DG C-Arm 1-60 Min-No Report Result Date: 10/29/2024 Fluoroscopy was utilized by the requesting physician.  No radiographic interpretation.   XR HIP UNILAT W OR W/O PELVIS 2-3 VIEWS RIGHT Result Date: 10/02/2024 An AP pelvis and lateral of the right hip shows severe end-stage bone-on-bone arthritis of the right hip.  There is cystic changes and sclerotic changes and complete loss of joint space with osteophytes as well.  This is worsened significantly when compared to films from 2 years ago.   Disposition: Discharge disposition: 01-Home or Self Care          Follow-up Information     Vernetta Lonni GRADE, MD. Go on 11/11/2024.   Specialty: Orthopedic Surgery Why: at 9:30 am for your first in office post operative appointment with Dr. Vernetta Pass information: [address] [place]  [address] [place] [address] [phone]         [facility] Follow up.   Specialty: Home Health Services Why: [facility] will contact you for the first home visit. Contact information: [address] [place] [address] [phone]                  Signed: Lonni GRADE Vernetta 10/30/2024, 2:31 PM

## 2024-10-30 NOTE — Progress Notes (Signed)
 Patient alert and oriented, ambulated, voided. D/c instructions explain and given to the patient and family all questions answered. Surgical site clean and dry no sign of infection. Patient d/c home with RW per order.

## 2024-10-30 NOTE — Progress Notes (Signed)
 NT assisted Pt to the bathroom with RW. Pt was fine standing and voiding. NT went to take his food tray out of the room and then we heard a loud noise. We open the door to find the Pt on the floor of the bathroom. Pt stated he felt himself getting weak while voiding and that was all he remembered. When we found the Pt on the floor he was diaphoretic but stated he was fine. V/S stable, CBG 157 see flowsheet. MD notified. CT of head and hip xray ordered per MD. Will continue to monitor.

## 2024-10-30 NOTE — Care Management Obs Status (Signed)
 MEDICARE OBSERVATION STATUS NOTIFICATION   Patient Details  Name: CAMILLO QUADROS MRN: [id number] Date of Birth: September 12, 1956   Medicare Observation Status Notification Given:       Landry DELENA Senters, RN 10/30/2024, 11:39 AM

## 2024-10-30 NOTE — Progress Notes (Signed)
 Physical Therapy Treatment  Patient Details Name: Jason [name] MRN: [id number] DOB: 03-29-1956 Today's Date: 10/30/2024   History of Present Illness 68 y.o. male presents to [facility] hospital on 10/29/2024 for elective R THA. PMH includes HTN and DMII.    PT Comments  Pt progressing well with post-op mobility. Reviewed HEP, car transfer, stair training, and appropriate activity progression. Pt anticipates d/c home this afternoon. Will continue to follow and progress as able per POC.    If plan is discharge home, recommend the following: A little help with walking and/or transfers;A little help with bathing/dressing/bathroom;Assistance with cooking/housework;Assist for transportation;Help with stairs or ramp for entrance   Can travel by private vehicle        Equipment Recommendations  Rolling walker (2 wheels);BSC/3in1    Recommendations for Other Services       Precautions / Restrictions Precautions Precautions: Fall Recall of Precautions/Restrictions: Intact Precaution/Restrictions Comments: no hip precautions Restrictions Weight Bearing Restrictions Per Provider Order: Yes RLE Weight Bearing Per Provider Order: Weight bearing as tolerated     Mobility  Bed Mobility Overal bed mobility: Modified Independent Bed Mobility: Supine to Sit           General bed mobility comments: Pt able to transition to EOB without difficulty.    Transfers Overall transfer level: Modified independent Equipment used: Rolling walker (2 wheels) Transfers: Sit to/from Stand             General transfer comment: Pt demonstrated proper hand placement on seated surface for safety. Smooth power up to full stand.    Ambulation/Gait Ambulation/Gait assistance: Modified independent (Device/Increase time) Gait Distance (Feet): 300 Feet Assistive device: Rolling walker (2 wheels) Gait Pattern/deviations: Step-through pattern, Decreased stride length, Narrow base of support Gait  velocity: Decreased Gait velocity interpretation: 1.31 - 2.62 ft/sec, indicative of limited community ambulator   General Gait Details: VC's for improved heel strike on R, step-through gait pattern with increased stride length. Good posture throughout. No overt LOB noted, no reports of lightheadedness or dizziness.   Stairs Stairs: Yes Stairs assistance: Supervision Stair Management: Two rails, Step to pattern, Forwards Number of Stairs: 4 General stair comments: VC's for sequencing and general safety.   Wheelchair Mobility     Tilt Bed    Modified Rankin (Stroke Patients Only)       Balance Overall balance assessment: Needs assistance Sitting-balance support: No upper extremity supported, Feet supported Sitting balance-Leahy Scale: Good     Standing balance support: Bilateral upper extremity supported, Reliant on assistive device for balance Standing balance-Leahy Scale: Poor                              Communication Communication Communication: No apparent difficulties  Cognition Arousal: Alert Behavior During Therapy: WFL for tasks assessed/performed   PT - Cognitive impairments: No apparent impairments                         Following commands: Intact      Cueing Cueing Techniques: Verbal cues  Exercises Total Joint Exercises Ankle Circles/Pumps: 10 reps Quad Sets: 5 reps Gluteal Sets: 5 reps Short Arc Quad: 10 reps Heel Slides: 5 reps Hip ABduction/ADduction: 5 reps    General Comments        Pertinent Vitals/Pain Pain Assessment Pain Assessment: Faces Faces Pain Scale: Hurts a little bit Pain Location: R hip Pain Descriptors / Indicators: Sore, Operative site guarding  Pain Intervention(s): Limited activity within patient's tolerance, Monitored during session, Repositioned    Home Living                          Prior Function            PT Goals (current goals can now be found in the care plan section)  Acute Rehab PT Goals Patient Stated Goal: to return home PT Goal Formulation: With patient/family Time For Goal Achievement: 11/02/24 Potential to Achieve Goals: Good Progress towards PT goals: Progressing toward goals    Frequency    7X/week      PT Plan      Co-evaluation              AM-PAC PT 6 Clicks Mobility   Outcome Measure  Help needed turning from your back to your side while in a flat bed without using bedrails?: None Help needed moving from lying on your back to sitting on the side of a flat bed without using bedrails?: None Help needed moving to and from a bed to a chair (including a wheelchair)?: None Help needed standing up from a chair using your arms (e.g., wheelchair or bedside chair)?: None Help needed to walk in hospital room?: A Little Help needed climbing 3-5 steps with a railing? : A Little 6 Click Score: 22    End of Session Equipment Utilized During Treatment: Gait belt Activity Tolerance: Patient tolerated treatment well Patient left: in bed;with call bell/phone within reach;with family/visitor present (EOB to get dressed) Nurse Communication: Mobility status PT Visit Diagnosis: Other abnormalities of gait and mobility (R26.89);Muscle weakness (generalized) (M62.81)     Time: 1202-1237 PT Time Calculation (min) (ACUTE ONLY): 35 min  Charges:    $Gait Training: 8-22 mins $Therapeutic Exercise: 8-22 mins PT General Charges $$ ACUTE PT VISIT: 1 Visit                     Leita Sable, PT, DPT Acute Rehabilitation Services Secure Chat Preferred Office: [phone]    Leita JONETTA Sable 10/30/2024, 1:16 PM

## 2024-10-30 NOTE — Progress Notes (Signed)
 PT Cancellation Note  Patient Details Name: Jason [name] MRN: [id number] DOB: [birth year]   Cancelled Treatment:    Reason Eval/Treat Not Completed: Discussed pt case with RN this morning. Noted fall and plan for CT later this morning. Will hold until pt returns from imaging and clear for activity with PT.    Leita JONETTA Sable 10/30/2024, 9:04 AM  Leita Sable, PT, DPT Acute Rehabilitation Services Secure Chat Preferred Office: [phone]

## 2024-10-30 NOTE — Discharge Instructions (Signed)
 Per [facility] clinic policy, our goal is ensure optimal postoperative pain control with a multimodal pain management strategy. For all [facility] patients, our goal is to wean post-operative narcotic medications by 6 weeks post-operatively. If this is not possible due to utilization of pain medication prior to surgery, your [facility] doctor will support your acute post-operative pain control for the first 6 weeks postoperatively, with a plan to transition you back to your primary pain team following that. Jason [name] will work to ensure a Therapist, occupational.  INSTRUCTIONS AFTER JOINT REPLACEMENT   Remove items at home which could result in a fall. This includes throw rugs or furniture in walking pathways ICE to the affected joint every three hours while awake for 30 minutes at a time, for at least the first 3-5 days, and then as needed for pain and swelling.  Continue to use ice for pain and swelling. You may notice swelling that will progress down to the foot and ankle.  This is normal after surgery.  Elevate your leg when you are not up walking on it.   Continue to use the breathing machine you got in the hospital (incentive spirometer) which will help keep your temperature down.  It is common for your temperature to cycle up and down following surgery, especially at night when you are not up moving around and exerting yourself.  The breathing machine keeps your lungs expanded and your temperature down.   DIET:  As you were doing prior to hospitalization, we recommend a well-balanced diet.  DRESSING / WOUND CARE / SHOWERING  Keep the surgical dressing until follow up.  The dressing is water  proof, so you can shower without any extra covering.  IF THE DRESSING FALLS OFF or the wound gets wet inside, change the dressing with sterile gauze.  Please use good hand washing techniques before changing the dressing.  Do not use any lotions or creams on the incision until instructed by your surgeon.     ACTIVITY  Increase activity slowly as tolerated, but follow the weight bearing instructions below.   No driving for 6 weeks or until further direction given by your physician.  You cannot drive while taking narcotics.  No lifting or carrying greater than 10 lbs. until further directed by your surgeon. Avoid periods of inactivity such as sitting longer than an hour when not asleep. This helps prevent blood clots.  You may return to work once you are authorized by your doctor.     WEIGHT BEARING   Weight bearing as tolerated with assist device (walker, cane, etc) as directed, use it as long as suggested by your surgeon or therapist, typically at least 4-6 weeks.   EXERCISES  Results after joint replacement surgery are often greatly improved when you follow the exercise, range of motion and muscle strengthening exercises prescribed by your doctor. Safety measures are also important to protect the joint from further injury. Any time any of these exercises cause you to have increased pain or swelling, decrease what you are doing until you are comfortable again and then slowly increase them. If you have problems or questions, call your caregiver or physical therapist for advice.   Rehabilitation is important following a joint replacement. After just a few days of immobilization, the muscles of the leg can become weakened and shrink (atrophy).  These exercises are designed to build up the tone and strength of the thigh and leg muscles and to improve motion. Often times heat used for twenty to thirty minutes before  working out will loosen up your tissues and help with improving the range of motion but do not use heat for the first two weeks following surgery (sometimes heat can increase post-operative swelling).   These exercises can be done on a training (exercise) mat, on the floor, on a table or on a bed. Use whatever works the best and is most comfortable for you.    Use music or television  while you are exercising so that the exercises are a pleasant break in your day. This will make your life better with the exercises acting as a break in your routine that you can look forward to.   Perform all exercises about fifteen times, three times per day or as directed.  You should exercise both the operative leg and the other leg as well.  Exercises include:   Quad Sets - Tighten up the muscle on the front of the thigh (Quad) and hold for 5-10 seconds.   Straight Leg Raises - With your knee straight (if you were given a brace, keep it on), lift the leg to 60 degrees, hold for 3 seconds, and slowly lower the leg.  Perform this exercise against resistance later as your leg gets stronger.  Leg Slides: Lying on your back, slowly slide your foot toward your buttocks, bending your knee up off the floor (only go as far as is comfortable). Then slowly slide your foot back down until your leg is flat on the floor again.  Angel Wings: Lying on your back spread your legs to the side as far apart as you can without causing discomfort.  Hamstring Strength:  Lying on your back, push your heel against the floor with your leg straight by tightening up the muscles of your buttocks.  Repeat, but this time bend your knee to a comfortable angle, and push your heel against the floor.  You may put a pillow under the heel to make it more comfortable if necessary.   A rehabilitation program following joint replacement surgery can speed recovery and prevent re-injury in the future due to weakened muscles. Contact your doctor or a physical therapist for more information on knee rehabilitation.    CONSTIPATION  Constipation is defined medically as fewer than three stools per week and severe constipation as less than one stool per week.  Even if you have a regular bowel pattern at home, your normal regimen is likely to be disrupted due to multiple reasons following surgery.  Combination of anesthesia, postoperative  narcotics, change in appetite and fluid intake all can affect your bowels.   YOU MUST use at least one of the following options; they are listed in order of increasing strength to get the job done.  They are all available over the counter, and you may need to use some, POSSIBLY even all of these options:    Drink plenty of fluids (prune juice may be helpful) and high fiber foods Colace 100 mg by mouth twice a day  Senokot for constipation as directed and as needed Dulcolax (bisacodyl), take with full glass of water  Miralax (polyethylene glycol) once or twice a day as needed.  If you have tried all these things and are unable to have a bowel movement in the first 3-4 days after surgery call either your surgeon or your primary doctor.    If you experience loose stools or diarrhea, hold the medications until you stool forms back up.  If your symptoms do not get better within 1 week  or if they get worse, check with your doctor.  If you experience the worst abdominal pain ever or develop nausea or vomiting, please contact the office immediately for further recommendations for treatment.   ITCHING:  If you experience itching with your medications, try taking only a single pain pill, or even half a pain pill at a time.  You can also use Benadryl  over the counter for itching or also to help with sleep.   TED HOSE STOCKINGS:  Use stockings on both legs until for at least 2 weeks or as directed by physician office. They may be removed at night for sleeping.  MEDICATIONS:  See your medication summary on the "After Visit Summary" that nursing will review with you.  You may have some home medications which will be placed on hold until you complete the course of blood thinner medication.  It is important for you to complete the blood thinner medication as prescribed.  PRECAUTIONS:  If you experience chest pain or shortness of breath - call 911 immediately for transfer to the hospital emergency department.    If you develop a fever greater that 101 F, purulent drainage from wound, increased redness or drainage from wound, foul odor from the wound/dressing, or calf pain - CONTACT YOUR SURGEON.                                                   FOLLOW-UP APPOINTMENTS:  If you do not already have a post-op appointment, please call the office for an appointment to be seen by your surgeon.  Guidelines for how soon to be seen are listed in your "After Visit Summary", but are typically between 1-4 weeks after surgery.  OTHER INSTRUCTIONS:   Knee Replacement:  Do not place pillow under knee, focus on keeping the knee straight while resting. CPM instructions: 0-90 degrees, 2 hours in the morning, 2 hours in the afternoon, and 2 hours in the evening. Place foam block, curve side up under heel at all times except when in CPM or when walking.  DO NOT modify, tear, cut, or change the foam block in any way.  POST-OPERATIVE OPIOID TAPER INSTRUCTIONS: It is important to wean off of your opioid medication as soon as possible. If you do not need pain medication after your surgery it is ok to stop day one. Opioids include: Codeine, Hydrocodone(Norco, Vicodin), Oxycodone (Percocet, oxycontin ) and hydromorphone  amongst others.  Long term and even short term use of opiods can cause: Increased pain response Dependence Constipation Depression Respiratory depression And more.  Withdrawal symptoms can include Flu like symptoms Nausea, vomiting And more Techniques to manage these symptoms Hydrate well Eat regular healthy meals Stay active Use relaxation techniques(deep breathing, meditating, yoga) Do Not substitute Alcohol to help with tapering If you have been on opioids for less than two weeks and do not have pain than it is ok to stop all together.  Plan to wean off of opioids This plan should start within one week post op of your joint replacement. Maintain the same interval or time between taking each dose  and first decrease the dose.  Cut the total daily intake of opioids by one tablet each day Next start to increase the time between doses. The last dose that should be eliminated is the evening dose.   MAKE SURE YOU:  Understand these instructions.  Get help right away if you are not doing well or get worse.    Thank you for letting us  be a part of your medical care team.  It is a privilege we respect greatly.  We hope these instructions will help you stay on track for a fast and full recovery!     Dental Antibiotics:  In most cases prophylactic antibiotics for Dental procdeures after total joint surgery are not necessary.  Exceptions are as follows:  1. History of prior total joint infection  2. Severely immunocompromised (Organ Transplant, cancer chemotherapy, Rheumatoid biologic meds such as Humera)  3. Poorly controlled diabetes (A1C &gt; 8.0, blood glucose over 200)  If you have one of these conditions, contact your surgeon for an antibiotic prescription, prior to your dental procedure.

## 2024-10-30 NOTE — Progress Notes (Signed)
 Subjective: 1 Day Post-Op Procedure(s) (LRB): ARTHROPLASTY, HIP, TOTAL, ANTERIOR APPROACH (Right) Patient reports pain as moderate.    Objective: Vital signs in last 24 hours: Temp:  [97.4 F (36.3 C)-98.8 F (37.1 C)] 98.1 F (36.7 C) (11/19 0330) Pulse Rate:  [57-89] 82 (11/19 0330) Resp:  [10-20] 18 (11/19 0330) BP: (85-129)/(57-75) 120/63 (11/19 0330) SpO2:  [94 %-99 %] 97 % (11/19 0330)  Intake/Output from previous day: 11/18 0701 - 11/19 0700 In: 1580 [P.O.:480; I.V.:900; IV Piggyback:200] Out: 975 [Urine:725; Blood:250] Intake/Output this shift: No intake/output data recorded.  Recent Labs    10/30/24 0155  HGB 13.5   Recent Labs    10/30/24 0155  WBC 14.5*  RBC 4.25  HCT 41.3  PLT 174   Recent Labs    10/30/24 0155  NA 139  K 5.4*  CL 105  CO2 26  BUN 21  CREATININE 1.29*  GLUCOSE 151*  CALCIUM  9.5   No results for input(s): LABPT, INR in the last 72 hours.  Sensation intact distally Intact pulses distally Dorsiflexion/Plantar flexion intact Incision: dressing C/D/I   Assessment/Plan: 1 Day Post-Op Procedure(s) (LRB): ARTHROPLASTY, HIP, TOTAL, ANTERIOR APPROACH (Right) Up with therapy Discharge home with home health      Jason [name] 10/30/2024, 7:37 AM

## 2024-11-04 ENCOUNTER — Ambulatory Visit

## 2024-11-04 VITALS — Ht 73.23 in | Wt 225.0 lb

## 2024-11-04 DIAGNOSIS — Z0000 Encounter for general adult medical examination without abnormal findings: Secondary | ICD-10-CM | POA: Diagnosis not present

## 2024-11-04 NOTE — Patient Instructions (Addendum)
 Jason [name],  Thank you for taking the time for your Medicare Wellness Visit. I appreciate your continued commitment to your health goals. Please review the care plan we discussed, and feel free to reach out if I can assist you further.  Please note that Annual Wellness Visits do not include a physical exam. Some assessments may be limited, especially if the visit was conducted virtually. If needed, we may recommend an in-person follow-up with your provider.  Ongoing Care Seeing your primary care provider every 3 to 6 months helps us  monitor your health and provide consistent, personalized care. Next office visit on 11/13/2024.  You are due for a foot exam, a kidney evaluation and a Flu vaccine, which all can be done during your next office visit.  You are also due for a 2nd Shingles vaccine and that can be given at your local pharmacy.  Get well soon.    Referrals If a referral was made during today's visit and you haven't received any updates within two weeks, please contact the referred provider directly to check on the status.  Recommended Screenings:  Health Maintenance  Topic Date Due   Yearly kidney health urinalysis for diabetes  Never done   Zoster (Shingles) Vaccine (2 of 2) 04/22/2021   Complete foot exam   05/03/2024   Flu Shot  07/12/2024   COVID-19 Vaccine (4 - 2025-26 season) 08/12/2024   Medicare Annual Wellness Visit  09/12/2024   Eye exam for diabetics  01/18/2025   Hemoglobin A1C  02/11/2025   Yearly kidney function blood test for diabetes  10/30/2025   Colon Cancer Screening  11/10/2025   DTaP/Tdap/Td vaccine (3 - Td or Tdap) 02/01/2026   Pneumococcal Vaccine for age over 74  Completed   Hepatitis C Screening  Completed   Meningitis B Vaccine  Aged Out       11/04/2024    3:41 PM  Advanced Directives  Does Patient Have a Medical Advance Directive? No    Vision: Annual vision screenings are recommended for early detection of glaucoma, cataracts, and  diabetic retinopathy. These exams can also reveal signs of chronic conditions such as diabetes and high blood pressure.  Dental: Annual dental screenings help detect early signs of oral cancer, gum disease, and other conditions linked to overall health, including heart disease and diabetes.  Please see the attached documents for additional preventive care recommendations.

## 2024-11-04 NOTE — Progress Notes (Signed)
 Chief Complaint  Patient presents with   Medicare Wellness     Subjective:   Jason [name] is a 68 y.o. male who presents for a Medicare Annual Wellness Visit.  Allergies (verified) Lisinopril and Reglan  [metoclopramide ]   History: Past Medical History:  Diagnosis Date   Arthritis    Bell palsy    Diabetes mellitus without complication (HCC)    HTN (hypertension)    Hyperlipidemia    Past Surgical History:  Procedure Laterality Date   COLONOSCOPY     COLONOSCOPY W/ POLYPECTOMY     TOTAL HIP ARTHROPLASTY Right 10/29/2024   Procedure: ARTHROPLASTY, HIP, TOTAL, ANTERIOR APPROACH;  Surgeon: Vernetta Lonni GRADE, MD;  Location: [facility] OR;  Service: Orthopedics;  Laterality: Right;   Family History  Problem Relation Age of Onset   Stroke Mother    Colon cancer Sister    High blood pressure Sister    Colon cancer Brother    High blood pressure Brother    Stomach cancer Brother    Lymphoma Son    Colon cancer Other        1st degree relative <60   Diabetes Other        1st degree relative    Stroke Other    Esophageal cancer Neg Hx    Rectal cancer Neg Hx    Social History   Occupational History   Occupation: transportation    Comment: Advertising account planner  Tobacco Use   Smoking status: Never   Smokeless tobacco: Never  Vaping Use   Vaping status: Never Used  Substance and Sexual Activity   Alcohol use: Yes    Alcohol/week: 6.0 standard drinks of alcohol    Types: 6 Cans of beer per week    Comment: occas   Drug use: Never   Sexual activity: Not on file   Tobacco Counseling Counseling given: Not Answered  SDOH Screenings   Food Insecurity: No Food Insecurity (11/04/2024)  Housing: Unknown (11/04/2024)  Transportation Needs: No Transportation Needs (11/04/2024)  Utilities: Not At Risk (11/04/2024)  Alcohol Screen: Low Risk  (09/13/2023)  Depression (PHQ2-9): Low Risk  (11/04/2024)  Financial Resource Strain: Low Risk  (09/13/2023)  Physical  Activity: Inactive (11/04/2024)  Social Connections: Moderately Integrated (11/04/2024)  Stress: No Stress Concern Present (11/04/2024)  Tobacco Use: Low Risk  (11/04/2024)  Health Literacy: Adequate Health Literacy (11/04/2024)   See flowsheets for full screening details  Depression Screen PHQ 2 & 9 Depression Scale- Over the past 2 weeks, how often have you been bothered by any of the following problems? Little interest or pleasure in doing things: 0 Feeling down, depressed, or hopeless (PHQ Adolescent also includes...irritable): 0 PHQ-2 Total Score: 0 Trouble falling or staying asleep, or sleeping too much: 0 Feeling tired or having little energy: 0 Poor appetite or overeating (PHQ Adolescent also includes...weight loss): 0 Feeling bad about yourself - or that you are a failure or have let yourself or your family down: 0 Trouble concentrating on things, such as reading the newspaper or watching television (PHQ Adolescent also includes...like school work): 0 Moving or speaking so slowly that other people could have noticed. Or the opposite - being so fidgety or restless that you have been moving around a lot more than usual: 0 Thoughts that you would be better off dead, or of hurting yourself in some way: 0 PHQ-9 Total Score: 0 If you checked off any problems, how difficult have these problems made it for you to do your  work, take care of things at home, or get along with other people?: Not difficult at all  Depression Treatment Depression Interventions/Treatment : PHQ2-9 Score <4 Follow-up Not Indicated     Goals Addressed   None    Visit info / Clinical Intake: Medicare Wellness Visit Type:: Subsequent Annual Wellness Visit Persons participating in visit:: patient Medicare Wellness Visit Mode:: Video Because this visit was a virtual/telehealth visit:: vitals recorded from last visit If Telephone or Video please confirm:: I connected with the patient using audio enabled  telemedicine application and verified that I am speaking with the correct person using two identifiers; I discussed the limitations of evaluation and management by telemedicine; The patient expressed understanding and agreed to proceed Patient Location:: Home Provider Location:: Home Information given by:: patient Interpreter Needed?: No Pre-visit prep was completed: no AWV questionnaire completed by patient prior to visit?: no Living arrangements:: with family/others (lives with daughter) Patient's Overall Health Status Rating: very good Typical amount of pain: some Does pain affect daily life?: (!) yes (just had hip replacement) Are you currently prescribed opioids?: (!) yes  Dietary Habits and Nutritional Risks How many meals a day?: 3 Eats fruit and vegetables daily?: yes Most meals are obtained by: preparing own meals In the last 2 weeks, have you had any of the following?: none Diabetic:: no  Functional Status Activities of Daily Living (to include ambulation/medication): Independent Ambulation: Independent with device- listed below Home Assistive Devices/Equipment: [place]; Eyeglasses Medication Administration: Independent Home Management: Independent Manage your own finances?: yes Primary transportation is: driving Concerns about vision?: no *vision screening is required for WT[facility]* Concerns about hearing?: (!) yes (Would like a hearing screening) Uses hearing aids?: no Hear whispered voice?: yes  Fall Screening Falls in the past year?: 0 Number of falls in past year: 0 Was there an injury with Fall?: 0 Fall Risk Category Calculator: 0 Patient Fall Risk Level: Low Fall Risk  Fall Risk Patient at Risk for Falls Due to: No Fall Risks Fall risk Follow up: Falls evaluation completed; Falls prevention discussed  Home and Transportation Safety: All rugs have non-skid backing?: N/A, no rugs All stairs or steps have railings?: N/A, no stairs Grab bars in the bathtub or  shower?: (!) no Have non-skid surface in bathtub or shower?: (!) no Good home lighting?: yes Regular seat belt use?: yes Hospital stays in the last year:: (!) yes How many hospital stays:: 1 Reason: hip surgery  Cognitive Assessment Difficulty concentrating, remembering, or making decisions? : no Will 6CIT or Mini Cog be Completed: no 6CIT or Mini Cog Declined: patient alert, oriented, able to answer questions appropriately and recall recent events  Advance Directives (For Healthcare) Does Patient Have a Medical Advance Directive?: No Would patient like information on creating a medical advance directive?: No - Patient declined  Reviewed/Updated  Reviewed/Updated: Reviewed All (Medical, Surgical, Family, Medications, Allergies, Care Teams, Patient Goals)        Objective:    Today's Vitals   11/04/24 1534  Weight: 225 lb (102.1 kg)  Height: 6' 1.23 (1.86 m)   Body mass index is 29.5 kg/m.  Current Medications (verified) Outpatient Encounter Medications as of 11/04/2024  Medication Sig   aspirin  81 MG chewable tablet Chew 1 tablet (81 mg total) by mouth 2 (two) times daily.   Blood Glucose Monitoring Suppl (ONE TOUCH ULTRA 2) w/Device KIT Use the device daily to check your blood sugars 1-4 times as instructed.   dapagliflozin  propanediol (FARXIGA ) 5 MG TABS tablet Take  1 tablet (5 mg total) by mouth daily.   diltiazem  (CARDIZEM  CD) 120 MG 24 hr capsule TAKE 1 CAPSULE BY MOUTH EVERY DAY   Insulin  Pen Needle 29G X MISC Use with insulin  pen   Lancets (ONETOUCH ULTRASOFT) lancets Use to check blood sugar BID   metFORMIN  (GLUCOPHAGE -XR) 500 MG 24 hr tablet Take 2 tablets (1,000 mg total) by mouth 2 (two) times daily with a meal. (Patient taking differently: Take 1,000 mg by mouth in the morning.)   methocarbamol  (ROBAXIN ) 500 MG tablet Take 1 tablet (500 mg total) by mouth every 6 (six) hours as needed for muscle spasms.   Multiple Vitamin (MULTIVITAMIN WITH MINERALS) TABS  tablet Take 1 tablet by mouth in the morning.   naproxen sodium (ALEVE) 220 MG tablet Take 440 mg by mouth daily as needed (pain/headache).   ONETOUCH VERIO test strip USE TO TEST BLOOD SUGAR TWICE DAILY   oxyCODONE  (OXY IR/ROXICODONE ) 5 MG immediate release tablet Take 1-2 tablets (5-10 mg total) by mouth every 6 (six) hours as needed for moderate pain (pain score 4-6) (pain score 4-6).   rosuvastatin  (CRESTOR ) 5 MG tablet TAKE 1 TABLET BY MOUTH EVERYDAY AT BEDTIME   Semaglutide , 2 MG/DOSE, (OZEMPIC , 2 MG/DOSE,) 8 MG/3ML SOPN Inject 2 mg into the skin every Saturday.   vitamin B-12 (CYANOCOBALAMIN ) 1000 MCG tablet Take 1 tablet (1,000 mcg total) by mouth daily.   No facility-administered encounter medications on file as of 11/04/2024.   Hearing/Vision screen Hearing Screening - Comments:: Has some issues. Would like a hearing screening. Vision Screening - Comments:: Wear eyeglasses for reading/[place]/UTD Immunizations and Health Maintenance Health Maintenance  Topic Date Due   Diabetic kidney evaluation - Urine ACR  Never done   Zoster Vaccines- Shingrix (2 of 2) 04/22/2021   FOOT EXAM  05/03/2024   Influenza Vaccine  07/12/2024   COVID-19 Vaccine (4 - 2025-26 season) 08/12/2024   Medicare Annual Wellness (AWV)  09/12/2024   OPHTHALMOLOGY EXAM  01/18/2025   HEMOGLOBIN A1C  02/11/2025   Diabetic kidney evaluation - eGFR measurement  10/30/2025   Colonoscopy  11/10/2025   DTaP/Tdap/Td (3 - Td or Tdap) 02/01/2026   Pneumococcal Vaccine: 50+ Years  Completed   Hepatitis C Screening  Completed   Meningococcal B Vaccine  Aged Out        Assessment/Plan:  This is a routine wellness examination for [facility].  Patient Care Team: Merna Huxley, NP as PCP - General (Family Medicine)  I have personally reviewed and noted the following in the patient's chart:   Medical and social history Use of alcohol, tobacco or illicit drugs  Current medications and supplements including opioid  prescriptions. Functional ability and status Nutritional status Physical activity Advanced directives List of other physicians Hospitalizations, surgeries, and ER visits in previous 12 months Vitals Screenings to include cognitive, depression, and falls Referrals and appointments  No orders of the defined types were placed in this encounter.  In addition, I have reviewed and discussed with patient certain preventive protocols, quality metrics, and best practice recommendations. A written personalized care plan for preventive services as well as general preventive health recommendations were provided to patient.   Carlyle Achenbach L Christophere Hillhouse, CMA   11/04/2024   No follow-ups on file.  After Visit Summary: (MyChart) Due to this being a telephonic visit, the after visit summary with patients personalized plan was offered to patient via MyChart   Nurse Notes: Patient is due for a foot exam, a UACR and a flu  vaccine, which he would like to get during his up coming office visit.  He is also due for a 2nd Shingrix vaccine.  He had no other concerns to address today.

## 2024-11-06 ENCOUNTER — Telehealth: Payer: Self-pay

## 2024-11-06 ENCOUNTER — Encounter: Payer: Self-pay | Admitting: *Deleted

## 2024-11-06 NOTE — Progress Notes (Signed)
 Jason [name]                                          MRN: [id number]   11/06/2024   The [facility] Quality Team Specialist reviewed this patient medical record for the purposes of chart review for care gap closure. The following were reviewed: chart review for care gap closure-kidney health evaluation for diabetes:eGFR  and uACR.    [facility] Quality Team

## 2024-11-06 NOTE — Progress Notes (Signed)
   11/06/2024  Patient ID: Jason [name], male   DOB: 24-Dec-1955, 68 y.o.   MRN: [id number]  Received notification from [employer] that patient has been APPROVED to continue to receive Farxiga  through 12/11/25.  [name], PharmD Clinical Pharmacist [phone]

## 2024-11-11 ENCOUNTER — Ambulatory Visit: Admitting: Orthopaedic Surgery

## 2024-11-11 ENCOUNTER — Encounter: Payer: Self-pay | Admitting: Orthopaedic Surgery

## 2024-11-11 DIAGNOSIS — Z96641 Presence of right artificial hip joint: Secondary | ICD-10-CM

## 2024-11-11 NOTE — Progress Notes (Signed)
 The patient is here today for his first postoperative visit status post a right total hip replacement to treat significant right hip pain and arthritis.  He is an active 68 year old gentleman.  He reports that he is doing well overall.  He is just ambulating with a cane.  On exam his right hip incision looks good.  Staples were removed and Steri-Strips applied.  There is swelling but no seroma.  His calf is soft.  He has been compliant with a baby aspirin  twice daily.  He will continue to increase his activities as comfort allows.  He can stop his baby aspirin .  He will still wear compressive socks if he does have swelling.  We will then see him back in a month to see how he is doing overall from a mobility standpoint but no x-rays are needed.

## 2024-11-13 ENCOUNTER — Ambulatory Visit: Admitting: Adult Health

## 2024-11-13 ENCOUNTER — Encounter: Payer: Self-pay | Admitting: Adult Health

## 2024-11-13 ENCOUNTER — Ambulatory Visit: Payer: Self-pay | Admitting: Adult Health

## 2024-11-13 VITALS — BP 120/80 | HR 82 | Temp 98.5°F | Ht 73.23 in | Wt 228.0 lb

## 2024-11-13 DIAGNOSIS — Z125 Encounter for screening for malignant neoplasm of prostate: Secondary | ICD-10-CM

## 2024-11-13 DIAGNOSIS — E119 Type 2 diabetes mellitus without complications: Secondary | ICD-10-CM | POA: Diagnosis not present

## 2024-11-13 DIAGNOSIS — Z23 Encounter for immunization: Secondary | ICD-10-CM | POA: Diagnosis not present

## 2024-11-13 DIAGNOSIS — Z7984 Long term (current) use of oral hypoglycemic drugs: Secondary | ICD-10-CM

## 2024-11-13 DIAGNOSIS — E782 Mixed hyperlipidemia: Secondary | ICD-10-CM

## 2024-11-13 DIAGNOSIS — Z0000 Encounter for general adult medical examination without abnormal findings: Secondary | ICD-10-CM | POA: Diagnosis not present

## 2024-11-13 DIAGNOSIS — Z7985 Long-term (current) use of injectable non-insulin antidiabetic drugs: Secondary | ICD-10-CM | POA: Diagnosis not present

## 2024-11-13 DIAGNOSIS — H9190 Unspecified hearing loss, unspecified ear: Secondary | ICD-10-CM

## 2024-11-13 DIAGNOSIS — I10 Essential (primary) hypertension: Secondary | ICD-10-CM

## 2024-11-13 LAB — PSA: PSA: 1.79 ng/mL (ref 0.10–4.00)

## 2024-11-13 LAB — LIPID PANEL
Cholesterol: 99 mg/dL (ref 0–200)
HDL: 54.4 mg/dL (ref >39.00)
LDL Cholesterol: 36 mg/dL (ref 0–99)
NonHDL: 44.97
Total CHOL/HDL Ratio: 2
Triglycerides: 47 mg/dL (ref 0.0–149.0)
VLDL: 9.4 mg/dL (ref 0.0–40.0)

## 2024-11-13 LAB — COMPREHENSIVE METABOLIC PANEL WITH GFR
ALT: 16 U/L (ref 0–53)
AST: 14 U/L (ref 0–37)
Albumin: 3.9 g/dL (ref 3.5–5.2)
Alkaline Phosphatase: 131 U/L — ABNORMAL HIGH (ref 39–117)
BUN: 15 mg/dL (ref 6–23)
CO2: 28 meq/L (ref 19–32)
Calcium: 10.1 mg/dL (ref 8.4–10.5)
Chloride: 104 meq/L (ref 96–112)
Creatinine, Ser: 1.28 mg/dL (ref 0.40–1.50)
GFR: 56.68 mL/min — ABNORMAL LOW (ref >60.00)
Glucose, Bld: 127 mg/dL — ABNORMAL HIGH (ref 70–99)
Potassium: 5 meq/L (ref 3.5–5.1)
Sodium: 140 meq/L (ref 135–145)
Total Bilirubin: 0.4 mg/dL (ref 0.2–1.2)
Total Protein: 7.8 g/dL (ref 6.0–8.3)

## 2024-11-13 LAB — TSH: TSH: 2.9 u[IU]/mL (ref 0.35–5.50)

## 2024-11-13 LAB — CBC
HCT: 38.5 % — ABNORMAL LOW (ref 39.0–52.0)
Hemoglobin: 12.8 g/dL — ABNORMAL LOW (ref 13.0–17.0)
MCHC: 33.2 g/dL (ref 30.0–36.0)
MCV: 97.2 fl (ref 78.0–100.0)
Platelets: 330 K/uL (ref 150.0–400.0)
RBC: 3.97 Mil/uL — ABNORMAL LOW (ref 4.22–5.81)
RDW: 14.1 % (ref 11.5–15.5)
WBC: 11.1 K/uL — ABNORMAL HIGH (ref 4.0–10.5)

## 2024-11-13 LAB — MICROALBUMIN / CREATININE URINE RATIO
Creatinine,U: 187 mg/dL
Microalb Creat Ratio: 23.7 mg/g (ref 0.0–30.0)
Microalb, Ur: 4.4 mg/dL — ABNORMAL HIGH (ref 0.0–1.9)

## 2024-11-13 LAB — HEMOGLOBIN A1C: Hgb A1c MFr Bld: 6.5 % (ref 4.6–6.5)

## 2024-11-13 MED ORDER — DILTIAZEM HCL ER COATED BEADS 120 MG PO CP24: ORAL_CAPSULE | ORAL | 3 refills | Status: AC

## 2024-11-13 NOTE — Patient Instructions (Signed)
 It was great seeing you today   We will follow up with you regarding your lab work   Please let me know if you need anything

## 2024-11-13 NOTE — Progress Notes (Signed)
 Subjective:    Patient ID: Jason [name], male    DOB: [birth year], 68 y.o.   MRN: [id number]  HPI Patient presents for yearly preventative medicine examination. He is a pleasant 68 year old male who  has a past medical history of Arthritis, Bell palsy, Diabetes mellitus without complication (HCC), HTN (hypertension), and Hyperlipidemia.  Diabetes mellitus type II-managed with metformin  1000 mg twice daily, Farxiga  5 mg daily ( through patient assistance) and Ozempic  2 mg weekly ( through patient assistance),  He does check his blood sugars periodically at home with readings in the 100-120  range. He has cut back on sugars and is trying to cut back on carbs. He is exercising.  Lab Results  Component Value Date   HGBA1C 6.5 (A) 08/14/2024   HGBA1C 6.9 (A) 05/07/2024   HGBA1C 8.4 (A) 02/06/2024   HTN -managed with Cardizem  120 mg daily.  He does not monitor his blood pressure at home.  Denies dizziness, lightheadedness, chest pain, or shortness of breath.  BP Readings from Last 3 Encounters:  11/13/24 120/80  10/30/24 129/74  10/22/24 (!) 163/97   Hyperlipidemia-managed with Crestor  5 mg daily.  He denies myalgia or fatigue Lab Results  Component Value Date   CHOL 142 11/03/2023   HDL 57.20 11/03/2023   LDLCALC 70 11/03/2023   TRIG 74.0 11/03/2023   CHOLHDL 2 11/03/2023   Hearing loss -  his partner reports that he has trouble hearing him and wants him to have a hearing evaluation done. Patient reports that he does have trouble hearing and has noticed that he has to turn up the tv in order to hear it. He would like to have a hearing evaluation done.   All immunizations and health maintenance protocols were reviewed with the patient and needed orders were placed.  Appropriate screening laboratory values were ordered for the patient including screening of hyperlipidemia, renal function and hepatic function. If indicated by BPH, a PSA was ordered.  Medication reconciliation,   past medical history, social history, problem list and allergies were reviewed in detail with the patient  Goals were established with regard to weight loss, exercise, and  diet in compliance with medications  He is up to date on routine colon cancer screening.   Acutely he had a right total hip done in November and has recovered well.    Review of Systems  Constitutional: Negative.   HENT:  Positive for hearing loss.   Eyes: Negative.   Respiratory: Negative.    Cardiovascular: Negative.   Gastrointestinal: Negative.   Endocrine: Negative.   Genitourinary: Negative.   Musculoskeletal: Negative.   Skin: Negative.   Allergic/Immunologic: Negative.   Neurological: Negative.   Hematological: Negative.   Psychiatric/Behavioral: Negative.    All other systems reviewed and are negative.  Past Medical History:  Diagnosis Date   Arthritis    Bell palsy    Diabetes mellitus without complication (HCC)    HTN (hypertension)    Hyperlipidemia     Social History   Socioeconomic History   Marital status: Widowed    Spouse name: Not on file   Number of children: 2   Years of education: 70   Highest education level: 12th grade  Occupational History   Occupation: transportation    Comment: Advertising account planner  Tobacco Use   Smoking status: Never   Smokeless tobacco: Never  Vaping Use   Vaping status: Never Used  Substance and Sexual Activity   Alcohol use: Yes  Alcohol/week: 6.0 standard drinks of alcohol    Types: 6 Cans of beer per week    Comment: occas   Drug use: Never   Sexual activity: Not on file  Other Topics Concern   Not on file  Social History Narrative      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       Fun: fix cars, drag race    Lives with daughter/2025   Social Drivers of Health   Financial Resource Strain: Low Risk  (11/12/2024)   Overall Financial Resource Strain (CARDIA)    Difficulty of Paying Living Expenses:  Not very hard  Food Insecurity: No Food Insecurity (11/12/2024)   Hunger Vital Sign    Worried About Running Out of Food in the Last Year: Never true    Ran Out of Food in the Last Year: Never true  Transportation Needs: No Transportation Needs (11/12/2024)   PRAPARE - Administrator, Civil Service (Medical): No    Lack of Transportation (Non-Medical): No  Physical Activity: Insufficiently Active (11/12/2024)   Exercise Vital Sign    Days of Exercise per Week: 3 days    Minutes of Exercise per Session: 30 min  Stress: No Stress Concern Present (11/12/2024)   [employer] of Occupational Health - Occupational Stress Questionnaire    Feeling of Stress: Not at all  Social Connections: Moderately Integrated (11/12/2024)   Social Connection and Isolation Panel    Frequency of Communication with Friends and Family: More than three times a week    Frequency of Social Gatherings with Friends and Family: Three times a week    Attends Religious Services: 1 to 4 times per year    Active Member of Clubs or Organizations: Yes    Attends Banker Meetings: 1 to 4 times per year    Marital Status: Widowed  Intimate Partner Violence: Patient Unable To Answer (11/04/2024)   Humiliation, Afraid, Rape, and Kick questionnaire    Fear of Current or Ex-Partner: Patient unable to answer    Emotionally Abused: Patient unable to answer    Physically Abused: Patient unable to answer    Sexually Abused: Patient unable to answer    Past Surgical History:  Procedure Laterality Date   COLONOSCOPY     COLONOSCOPY W/ POLYPECTOMY     TOTAL HIP ARTHROPLASTY Right 10/29/2024   Procedure: ARTHROPLASTY, HIP, TOTAL, ANTERIOR APPROACH;  Surgeon: Vernetta Lonni GRADE, MD;  Location: [facility] OR;  Service: Orthopedics;  Laterality: Right;    Family History  Problem Relation Age of Onset   Stroke Mother    Colon cancer Sister    High blood pressure Sister    Colon cancer Brother    High blood  pressure Brother    Stomach cancer Brother    Lymphoma Son    Colon cancer Other        1st degree relative <60   Diabetes Other        1st degree relative    Stroke Other    Esophageal cancer Neg Hx    Rectal cancer Neg Hx     Allergies  Allergen Reactions   Lisinopril     hyperkalemia   Reglan  [Metoclopramide ] Anxiety    Patient had bad reaction requiring ativan  in addition to benadryl . He doesn't want to receive it any more    Current Outpatient Medications on File Prior to Visit  Medication Sig Dispense Refill  aspirin  81 MG chewable tablet Chew 1 tablet (81 mg total) by mouth 2 (two) times daily. 30 tablet 0   Blood Glucose Monitoring Suppl (ONE TOUCH ULTRA 2) w/Device KIT Use the device daily to check your blood sugars 1-4 times as instructed. 1 kit 0   dapagliflozin  propanediol (FARXIGA ) 5 MG TABS tablet Take 1 tablet (5 mg total) by mouth daily. 90 tablet 0   diltiazem  (CARDIZEM  CD) 120 MG 24 hr capsule TAKE 1 CAPSULE BY MOUTH EVERY DAY 90 capsule 0   Insulin  Pen Needle 29G X MISC Use with insulin  pen 100 each 1   Lancets (ONETOUCH ULTRASOFT) lancets Use to check blood sugar BID 100 each 12   metFORMIN  (GLUCOPHAGE -XR) 500 MG 24 hr tablet Take 2 tablets (1,000 mg total) by mouth 2 (two) times daily with a meal. (Patient taking differently: Take 1,000 mg by mouth in the morning.) 360 tablet 1   methocarbamol  (ROBAXIN ) 500 MG tablet Take 1 tablet (500 mg total) by mouth every 6 (six) hours as needed for muscle spasms. 30 tablet 0   Multiple Vitamin (MULTIVITAMIN WITH MINERALS) TABS tablet Take 1 tablet by mouth in the morning.     naproxen sodium (ALEVE) 220 MG tablet Take 440 mg by mouth daily as needed (pain/headache).     ONETOUCH VERIO test strip USE TO TEST BLOOD SUGAR TWICE DAILY 100 strip 5   oxyCODONE  (OXY IR/ROXICODONE ) 5 MG immediate release tablet Take 1-2 tablets (5-10 mg total) by mouth every 6 (six) hours as needed for moderate pain (pain score 4-6) (pain  score 4-6). 30 tablet 0   rosuvastatin  (CRESTOR ) 5 MG tablet TAKE 1 TABLET BY MOUTH EVERYDAY AT BEDTIME 90 tablet 1   Semaglutide , 2 MG/DOSE, (OZEMPIC , 2 MG/DOSE,) 8 MG/3ML SOPN Inject 2 mg into the skin every Saturday.     vitamin B-12 (CYANOCOBALAMIN ) 1000 MCG tablet Take 1 tablet (1,000 mcg total) by mouth daily. 30 tablet 0   No current facility-administered medications on file prior to visit.    BP 120/80   Pulse 82   Temp 98.5 F (36.9 C) (Oral)   Ht 6' 1.23 (1.86 m)   Wt 228 lb (103.4 kg)   SpO2 92%   BMI 29.89 kg/m       Objective:   Physical Exam Vitals and nursing note reviewed.  Constitutional:      General: He is not in acute distress.    Appearance: Normal appearance. He is not ill-appearing.  HENT:     Head: Normocephalic and atraumatic.     Right Ear: Tympanic membrane, ear canal and external ear normal. There is no impacted cerumen.     Left Ear: Tympanic membrane, ear canal and external ear normal. There is no impacted cerumen.     Nose: Nose normal. No congestion or rhinorrhea.     Mouth/Throat:     Mouth: Mucous membranes are moist.     Pharynx: Oropharynx is clear.  Eyes:     Extraocular Movements: Extraocular movements intact.     Conjunctiva/sclera: Conjunctivae normal.     Pupils: Pupils are equal, round, and reactive to light.  Neck:     Vascular: No carotid bruit.  Cardiovascular:     Rate and Rhythm: Normal rate and regular rhythm.     Pulses: Normal pulses.     Heart sounds: No murmur heard.    No friction rub. No gallop.  Pulmonary:     Effort: Pulmonary effort is normal.  Breath sounds: Normal breath sounds.  Abdominal:     General: Abdomen is flat. Bowel sounds are normal. There is no distension.     Palpations: Abdomen is soft. There is no mass.     Tenderness: There is no abdominal tenderness. There is no guarding or rebound.     Hernia: No hernia is present.  Musculoskeletal:        General: Normal range of motion.      Cervical back: Normal range of motion and neck supple.  Lymphadenopathy:     Cervical: No cervical adenopathy.  Skin:    General: Skin is warm and dry.     Capillary Refill: Capillary refill takes less than 2 seconds.  Neurological:     General: No focal deficit present.     Mental Status: He is alert and oriented to person, place, and time.  Psychiatric:        Mood and Affect: Mood normal.        Behavior: Behavior normal.        Thought Content: Thought content normal.        Judgment: Judgment normal.        Assessment & Plan:  1. Routine general medical examination at a health care facility (Primary) Today patient counseled on age appropriate routine health concerns for screening and prevention, each reviewed and up to date or declined. Immunizations reviewed and up to date or declined. Labs ordered and reviewed. Risk factors for depression reviewed and negative. Hearing function and visual acuity are intact. ADLs screened and addressed as needed. Functional ability and level of safety reviewed and appropriate. Education, counseling and referrals performed based on assessed risks today. Patient provided with a copy of personalized plan for preventive services. - Follow up in one year or sooner if needed - Eat healthy and exercise  2. Diabetes mellitus treated with oral medication (HCC) - Continue with current oral medications  - Lipid panel; Future - TSH; Future - CBC; Future - Comprehensive metabolic panel with GFR; Future - Hemoglobin A1c; Future - Microalbumin/Creatinine Ratio, Urine; Future  3. Long-term current use of injectable noninsulin antidiabetic medication - Continue with Ozempic  2 mg weekly.  - Likely 3 month follow up  - Lipid panel; Future - TSH; Future - CBC; Future - Comprehensive metabolic panel with GFR; Future - Hemoglobin A1c; Future - Microalbumin/Creatinine Ratio, Urine; Future  4. Essential hypertension - Well controlled. No change in  medication  - Lipid panel; Future - TSH; Future - CBC; Future - Comprehensive metabolic panel with GFR; Future - diltiazem  (CARDIZEM  CD) 120 MG 24 hr capsule; TAKE 1 CAPSULE BY MOUTH EVERY DAY  Dispense: 90 capsule; Refill: 3  5. Mixed hyperlipidemia - Consider increase in statin  - Lipid panel; Future - TSH; Future - CBC; Future - Comprehensive metabolic panel with GFR; Future  6. Prostate cancer screening  - PSA; Future  7. Need for influenza vaccination  - Flu vaccine HIGH DOSE PF(Fluzone Trivalent)  8. Hearing loss, unspecified hearing loss type, unspecified laterality  - Ambulatory referral to Audiology  Darleene Shape, NP

## 2024-11-18 NOTE — Progress Notes (Signed)
 Jason [name]                                          MRN: [id number]   11/18/2024   The [facility] Quality Team Specialist reviewed this patient medical record for the purposes of chart review for care gap closure. The following were reviewed: abstraction for care gap closure-glycemic status assessment and kidney health evaluation for diabetes:eGFR  and uACR.    [facility] Quality Team

## 2024-12-09 ENCOUNTER — Ambulatory Visit: Admitting: Orthopaedic Surgery

## 2024-12-09 ENCOUNTER — Encounter: Payer: Self-pay | Admitting: Orthopaedic Surgery

## 2024-12-09 DIAGNOSIS — Z96641 Presence of right artificial hip joint: Secondary | ICD-10-CM

## 2024-12-09 NOTE — Progress Notes (Signed)
 The patient is being seen in follow-up status post a right total hip replacement.  This was 6 weeks ago.  He is an active 68 year old gentleman.  He has no complaints at all and reports good range of motion and strength and performing ADLs easier.  He is walking without assistive device and a normal-appearing gait.  His leg lengths are equal.  He tolerates this easily putting his right hip to rotation.  His left hip also moves smoothly.  He is doing so well that his follow-up can be in 6 months.  If there are issues before then he knows to reach out and let us  know.  If not we will have a standing AP pelvis and lateral of his right hip at that visit.

## 2024-12-16 ENCOUNTER — Other Ambulatory Visit

## 2024-12-16 DIAGNOSIS — Z7984 Long term (current) use of oral hypoglycemic drugs: Secondary | ICD-10-CM

## 2024-12-16 DIAGNOSIS — E119 Type 2 diabetes mellitus without complications: Secondary | ICD-10-CM

## 2024-12-16 NOTE — Progress Notes (Signed)
 "  12/16/2024 Name: Jason [name] MRN: [id number] DOB: 02-26-1956  Chief Complaint  Patient presents with   Medication Management   Diabetes    Jason [name] is a 69 y.o. year old male who presented for a telephone visit.   They were referred to the pharmacist by their PCP for assistance in managing diabetes, medication access, and complex medication management.    Subjective:  Care Team: Primary Care Provider: Merna Huxley, NP ; Next Scheduled Visit: 02/2025  Medication Access/Adherence  Current Pharmacy:  [id number] [name], [place] - [address] [place] [address] Phone: [phone] Fax: [phone]  [facility] - [place], [place] - [address] [place] [address] Phone: [phone] Fax: [phone]   Patient reports affordability concerns with their medications: Yes - now that Ozempic  PAP has ended Patient reports access/transportation concerns to their pharmacy: No  Patient reports adherence concerns with their medications:  No     Diabetes:  Current medications:  Farxiga  5mg , Ozempic  2mg  Medications tried in the past: Trulicity  (stopped due to cost), Lantus  (was just being used until patient could increase ozempic )  Current glucose readings: Reports sugars within range   Observed patterns:  Patient denies hypoglycemic s/sx including dizziness, shakiness, sweating. Patient denies hyperglycemic symptoms including polyuria, polydipsia, polyphagia, nocturia, neuropathy, blurred vision.   Current medication access support: Farxiga  5mg  (AZ&Me)     Objective:  Lab Results  Component Value Date   HGBA1C 6.5 11/13/2024    Lab Results  Component Value Date   CREATININE 1.28 11/13/2024   BUN 15 11/13/2024   NA 140 11/13/2024   K 5.0 11/13/2024   CL 104 11/13/2024   CO2 28 11/13/2024    Lab Results  Component Value Date   CHOL 99 11/13/2024   HDL 54.40 11/13/2024   LDLCALC 36  11/13/2024   TRIG 47.0 11/13/2024   CHOLHDL 2 11/13/2024    Medications Reviewed Today     Reviewed by Lionell Jon DEL, RPH (Pharmacist) on 12/16/24 at 1552  Med List Status: <None>   Medication Order Taking? Sig Documenting Provider Last Dose Status Informant  aspirin  81 MG chewable tablet [id number]  Chew 1 tablet (81 mg total) by mouth 2 (two) times daily. Vernetta Lonni GRADE, MD  Active   Blood Glucose Monitoring Suppl (ONE TOUCH ULTRA 2) w/Device KIT [id number]  Use the device daily to check your blood sugars 1-4 times as instructed. Nafziger, Huxley, NP  Active Self  dapagliflozin  propanediol (FARXIGA ) 5 MG TABS tablet [id number]  Take 1 tablet (5 mg total) by mouth daily. Nafziger, Huxley, NP  Active Self  diltiazem  (CARDIZEM  CD) 120 MG 24 hr capsule [id number]  TAKE 1 CAPSULE BY MOUTH EVERY DAY Nafziger, Huxley, NP  Active   Lancets (ONETOUCH ULTRASOFT) lancets [id number]  Use to check blood sugar BID Nafziger, Huxley, NP  Active Self  metFORMIN  (GLUCOPHAGE -XR) 500 MG 24 hr tablet [id number]  Take 2 tablets (1,000 mg total) by mouth 2 (two) times daily with a meal.  Patient taking differently: Take 1,000 mg by mouth in the morning.   Merna Huxley, NP  Expired 11/13/24 2359 Self  methocarbamol  (ROBAXIN ) 500 MG tablet [id number]  Take 1 tablet (500 mg total) by mouth every 6 (six) hours as needed for muscle spasms. Vernetta Lonni GRADE, MD  Active   Multiple Vitamin (MULTIVITAMIN WITH MINERALS) TABS tablet [id number]  Take 1 tablet by mouth in the morning. [provider]  Active Self  naproxen sodium (ALEVE) 220 MG tablet [id number]  Take 440 mg by mouth daily as needed (pain/headache). [provider]  Active Self  AISHA SINKS test strip [id number]  USE TO TEST BLOOD SUGAR TWICE DAILY Nafziger, Darleene, NP  Active Self  oxyCODONE  (OXY IR/ROXICODONE ) 5 MG immediate release tablet [id number]  Take 1-2 tablets (5-10 mg total) by mouth every 6 (six) hours as needed for moderate  pain (pain score 4-6) (pain score 4-6). Vernetta Lonni GRADE, MD  Active   rosuvastatin  (CRESTOR ) 5 MG tablet [id number]  TAKE 1 TABLET BY MOUTH EVERYDAY AT BEDTIME Nafziger, Darleene, NP  Active Self  Semaglutide , 2 MG/DOSE, (OZEMPIC , 2 MG/DOSE,) 8 MG/3ML SOPN [id number]  Inject 2 mg into the skin every Saturday. [provider]  Active Self  vitamin B-12 (CYANOCOBALAMIN ) 1000 MCG tablet [id number]  Take 1 tablet (1,000 mcg total) by mouth daily. Dennise Lavada POUR, MD  Active Self              Assessment/Plan:   Diabetes: - Currently controlled - Reviewed long term cardiovascular and renal outcomes of uncontrolled blood sugar - Reviewed goal A1c, goal fasting, and goal 2 hour post prandial glucose - Reviewed dietary modifications including low carb diet - Patient denies personal or family history of multiple endocrine neoplasia type 2, medullary thyroid  cancer; personal history of pancreatitis or gallbladder disease. - Recommend to check glucose once daily at varying times -Continue current med therapy and finish out the Ozempic  supply you have on hand -Price checked Ozempic  through insurance for 2026 and very $$$ due to $440 deductible[employer]ulicity  has re-opened their [employer] PAP, patient would like to switch back, will prepare application. Patient would like to come by office to sign on 12/18/24.      Follow Up Plan: 1 month  [name], PharmD Clinical Pharmacist [phone]  "

## 2024-12-23 ENCOUNTER — Telehealth: Payer: Self-pay

## 2024-12-23 NOTE — Progress Notes (Signed)
" ° °  12/23/2024  Patient ID: Jason [name], male   DOB: Jul 03, 1956, 69 y.o.   MRN: [id number]  Submitted completed application to [facility] for Trulicity  patient assistance, pending company decision.  [name], PharmD Clinical Pharmacist [phone]  "

## 2024-12-25 NOTE — Progress Notes (Signed)
" ° °  12/25/2024  Patient ID: Jason [name], male   DOB: May 19, 1956, 69 y.o.   MRN: [id number]  Pharmacy Quality Measure Review  This patient is appearing on a report for being at risk of failing the adherence measure for diabetes medications this calendar year.   Medication: Metformin  Last fill date: 12/13/24 for 90 day supply  Insurance report was not up to date. No action needed at this time.   [name], PharmD Clinical Pharmacist [phone]   "

## 2025-01-06 ENCOUNTER — Telehealth: Payer: Self-pay

## 2025-01-06 NOTE — Progress Notes (Signed)
" ° °  01/06/2025  Patient ID: Jason [name], male   DOB: 12/09/1956, 69 y.o.   MRN: [id number]  Received notification from [facility] that patient has been APPROVED to receive trulicity  through 12/11/24.  [name], PharmD Clinical Pharmacist [phone]  "

## 2025-01-13 ENCOUNTER — Other Ambulatory Visit

## 2025-01-13 DIAGNOSIS — E1169 Type 2 diabetes mellitus with other specified complication: Secondary | ICD-10-CM

## 2025-01-16 ENCOUNTER — Encounter (INDEPENDENT_AMBULATORY_CARE_PROVIDER_SITE_OTHER): Payer: Self-pay

## 2025-02-11 ENCOUNTER — Ambulatory Visit: Admitting: Adult Health

## 2025-03-19 ENCOUNTER — Other Ambulatory Visit

## 2025-06-09 ENCOUNTER — Ambulatory Visit: Admitting: Orthopaedic Surgery
